# Patient Record
Sex: Female | Born: 1980 | Race: White | Hispanic: No | Marital: Married | Smoking: Former smoker
Health system: Southern US, Community
[De-identification: ages and names within clinical notes are randomized; demographics above are authoritative.]

## PROBLEM LIST (undated history)

## (undated) DIAGNOSIS — I10 Essential (primary) hypertension: Secondary | ICD-10-CM

## (undated) DIAGNOSIS — R569 Unspecified convulsions: Secondary | ICD-10-CM

## (undated) HISTORY — PX: CHOLECYSTECTOMY: SHX55

## (undated) HISTORY — DX: Essential (primary) hypertension: I10

---

## 2004-05-16 ENCOUNTER — Ambulatory Visit: Payer: Self-pay | Admitting: Internal Medicine

## 2004-06-15 ENCOUNTER — Ambulatory Visit: Payer: Self-pay | Admitting: Family Medicine

## 2004-10-03 ENCOUNTER — Ambulatory Visit: Payer: Self-pay | Admitting: Internal Medicine

## 2005-05-15 ENCOUNTER — Ambulatory Visit: Payer: Self-pay | Admitting: Family Medicine

## 2005-10-14 ENCOUNTER — Ambulatory Visit: Payer: Self-pay | Admitting: Family Medicine

## 2006-04-23 ENCOUNTER — Ambulatory Visit: Payer: Self-pay | Admitting: Internal Medicine

## 2006-05-26 ENCOUNTER — Ambulatory Visit: Payer: Self-pay | Admitting: Internal Medicine

## 2007-02-02 ENCOUNTER — Telehealth (INDEPENDENT_AMBULATORY_CARE_PROVIDER_SITE_OTHER): Payer: Self-pay | Admitting: *Deleted

## 2007-02-02 ENCOUNTER — Ambulatory Visit: Payer: Self-pay | Admitting: Family Medicine

## 2007-02-02 DIAGNOSIS — R030 Elevated blood-pressure reading, without diagnosis of hypertension: Secondary | ICD-10-CM | POA: Insufficient documentation

## 2007-02-02 DIAGNOSIS — F17200 Nicotine dependence, unspecified, uncomplicated: Secondary | ICD-10-CM | POA: Insufficient documentation

## 2007-02-02 DIAGNOSIS — Z87891 Personal history of nicotine dependence: Secondary | ICD-10-CM | POA: Insufficient documentation

## 2007-02-04 ENCOUNTER — Telehealth (INDEPENDENT_AMBULATORY_CARE_PROVIDER_SITE_OTHER): Payer: Self-pay | Admitting: *Deleted

## 2007-05-08 ENCOUNTER — Telehealth (INDEPENDENT_AMBULATORY_CARE_PROVIDER_SITE_OTHER): Payer: Self-pay | Admitting: *Deleted

## 2007-12-21 ENCOUNTER — Telehealth (INDEPENDENT_AMBULATORY_CARE_PROVIDER_SITE_OTHER): Payer: Self-pay | Admitting: *Deleted

## 2008-04-01 ENCOUNTER — Ambulatory Visit: Payer: Self-pay | Admitting: Family Medicine

## 2008-04-01 DIAGNOSIS — J309 Allergic rhinitis, unspecified: Secondary | ICD-10-CM | POA: Insufficient documentation

## 2008-04-05 LAB — CONVERTED CEMR LAB
ALT: 17 units/L (ref 0–35)
AST: 17 units/L (ref 0–37)
Albumin: 4.6 g/dL (ref 3.5–5.2)
BUN: 7 mg/dL (ref 6–23)
Basophils Relative: 0.3 % (ref 0.0–3.0)
CO2: 29 meq/L (ref 19–32)
Calcium: 9.6 mg/dL (ref 8.4–10.5)
Chloride: 107 meq/L (ref 96–112)
Cholesterol: 190 mg/dL (ref 0–200)
Creatinine, Ser: 0.6 mg/dL (ref 0.4–1.2)
Eosinophils Relative: 1.7 % (ref 0.0–5.0)
Glucose, Bld: 82 mg/dL (ref 70–99)
Hemoglobin: 15.1 g/dL — ABNORMAL HIGH (ref 12.0–15.0)
LDL Cholesterol: 94 mg/dL (ref 0–99)
Lymphocytes Relative: 21.4 % (ref 12.0–46.0)
MCHC: 34.9 g/dL (ref 30.0–36.0)
Monocytes Relative: 10 % (ref 3.0–12.0)
Neutro Abs: 5.5 10*3/uL (ref 1.4–7.7)
Neutrophils Relative %: 66.6 % (ref 43.0–77.0)
RBC: 4.29 M/uL (ref 3.87–5.11)
TSH: 1.45 microintl units/mL (ref 0.35–5.50)
Total Bilirubin: 1 mg/dL (ref 0.3–1.2)
Total Protein: 7.4 g/dL (ref 6.0–8.3)
VLDL: 23 mg/dL (ref 0–40)
WBC: 8.2 10*3/uL (ref 4.5–10.5)

## 2009-07-19 ENCOUNTER — Ambulatory Visit: Payer: Self-pay | Admitting: Family Medicine

## 2009-07-19 DIAGNOSIS — B9789 Other viral agents as the cause of diseases classified elsewhere: Secondary | ICD-10-CM

## 2009-07-19 DIAGNOSIS — J069 Acute upper respiratory infection, unspecified: Secondary | ICD-10-CM | POA: Insufficient documentation

## 2009-08-03 LAB — CONVERTED CEMR LAB

## 2009-09-01 ENCOUNTER — Ambulatory Visit: Payer: Self-pay | Admitting: Family Medicine

## 2009-09-01 LAB — CONVERTED CEMR LAB
AST: 17 units/L (ref 0–37)
Alkaline Phosphatase: 65 units/L (ref 39–117)
Bilirubin, Direct: 0 mg/dL (ref 0.0–0.3)
CO2: 26 meq/L (ref 19–32)
Calcium: 9.3 mg/dL (ref 8.4–10.5)
HDL: 86.4 mg/dL (ref >39.00)
Potassium: 4.3 meq/L (ref 3.5–5.1)
Sodium: 139 meq/L (ref 135–145)
Total CHOL/HDL Ratio: 2
Total Protein: 6.9 g/dL (ref 6.0–8.3)

## 2010-02-15 ENCOUNTER — Encounter (INDEPENDENT_AMBULATORY_CARE_PROVIDER_SITE_OTHER): Payer: Self-pay | Admitting: *Deleted

## 2010-08-14 NOTE — Assessment & Plan Note (Signed)
Summary: COLD/CLE   Vital Signs:  Patient profile:   30 year old female Height:      64 inches Weight:      140.75 pounds BMI:     24.25 Temp:     99 degrees F oral Pulse rate:   92 / minute Pulse rhythm:   regular BP sitting:   160 / 94  (left arm) Cuff size:   regular  Vitals Entered By: Lewanda Rife LPN (July 19, 2009 1:54 PM)  CC:  cold, productive cough with yellow mucus, drainage at back of throat, and head congested and irritated throat.Marland Kitchen  History of Present Illness: Here for cough which is productive, head congestion--onset 3wks ago --treated with Amoxicillin 500 three times a day x 10d --completed on 07/12/2009--did not get better --taking lots of OTCs for cough and congestion--mucinex D, Alkeseltzer D--helped, benadryl --not worse, just has not gone away  Is aware that BP elevated today and has been at home for several weeks --has 1 5hr boost drink daily for several months--high caffeine --coffee--0-1 [employer] --no tea --2 20oz diet Pepsis daily   Has appt for CPX 08/12/2009  Problems Prior to Update: 1)  Allergic Rhinitis  (ICD-477.9) 2)  Well Adult Exam  (ICD-V70.0) 3)  Elevated Blood Pressure Without Diagnosis of Hypertension  (ICD-796.2) 4)  Cigarette Smoker  (ICD-305.1)  Medications Prior to Update: 1)  Fluoxetine Hcl 20 Mg Caps (Fluoxetine Hcl) .Marland Kitchen.. 1 Daily 2)  Astepro 137 Mcg/spray Soln (Azelastine Hcl) .... 2 Sprays Two Times A Day For Allergic Rhinitis  Allergies: 1)  ! Codeine  Past History:  Family History: Last updated: 04/01/2008 Father: COPD--smoker, CAD--to have CABG, HBP, MI Mother: elevated lipids, HBP Siblings: 2 br--L&W               1 sis--L&W  DM-0 MI- PGM,  CVA- Maunt x2--aneurisms Prostate Cancer-0 Breast Cancer-Maunt--post menapausal Ovarian Cancer-0 Uterine Cancer-0 Colon Cancer-0 Drug/ ETOH Abuse-both sides  Depression- 0  Social History: Last updated: 04/01/2008 Marital Status: Married Children: 2 Occupation:  peridontal assist  Risk Factors: Alcohol Use: <1 (04/01/2008) Caffeine Use: 2 (04/01/2008) Exercise: no (04/01/2008)  Risk Factors: Smoking Status: current (02/02/2007) Packs/Day: 1/2 (04/01/2008) Passive Smoke Exposure: no (04/01/2008)  Review of Systems      See HPI  Physical Exam  General:  alert, well-developed, well-nourished, and well-hydrated.  NAD Ears:  TMs retracted bilat Nose:  no airflow obstruction, mucosal erythema, and mucosal edema.  sinuses neg Mouth:  no exudates and pharyngeal erythema.   Neck:  no JVD and no carotid bruits.   Lungs:  moist cough only, no crackles and no wheezes.   Heart:  normal rate, regular rhythm, and no murmur.   Extremities:  no edema either lower legs Neurologic:  alert & oriented X3, sensation intact to light touch, and gait normal.   Psych:  normally interactive and good eye contact.     Impression & Recommendations:  Problem # 1:  URI (ICD-465.9) Assessment New continue comfort care measures: increase po fluids, rest, tylenol or IBP as needed will begin clarinex once daily will use Afrin nasal spray at hs as needed will use tussinex at hs as needed cough see back in 7-10d if not improved Her updated medication list for this problem includes:    Alka-seltzer Plus Cold 2-7.8-325 Mg Tbef (Chlorphen-phenyleph-asa) ..... Otc as directed.    Mucinex D 60-600 Mg Xr12h-tab (Pseudoephedrine-guaifenesin) ..... Otc as directed.    Benadryl 25 Mg Tabs (Diphenhydramine hcl) .Marland KitchenMarland KitchenMarland KitchenMarland Kitchen  Otc as directed.    Clarinex 5 Mg Tabs (Desloratadine) .Marland Kitchen... Take 1 daily    Tussionex Pennkinetic Er 8-10 Mg/22ml Lqcr (Chlorpheniramine-hydrocodone) .Marland Kitchen... 1 tsp at bedtime for congestion  Problem # 2:  ELEVATED BLOOD PRESSURE WITHOUT DIAGNOSIS OF HYPERTENSION (ICD-796.2) Assessment: New new--?due to large caffeine load daily to stop the boost drink now to begin reducing caffeine sods by 1/4 weekly in 1 wk--to slow reduction if developes HA--understands--needs  to get off caffeine to keep CPX appt--will re-eval at that time  Complete Medication List: 1)  Alka-seltzer Plus Cold 2-7.8-325 Mg Tbef (Chlorphen-phenyleph-asa) .... Otc as directed. 2)  Mucinex D 60-600 Mg Xr12h-tab (Pseudoephedrine-guaifenesin) .... Otc as directed. 3)  Benadryl 25 Mg Tabs (Diphenhydramine hcl) .... Otc as directed. 4)  Clarinex 5 Mg Tabs (Desloratadine) .... Take 1 daily 5)  Afrin Nasal Spray  .Marland Kitchen.. 1-2 sprays two times a day as needed congestion 6)  Tussionex Pennkinetic Er 8-10 Mg/43ml Lqcr (Chlorpheniramine-hydrocodone) .Marland Kitchen.. 1 tsp at bedtime for congestion  Prescriptions: TUSSIONEX PENNKINETIC ER 8-10 MG/5ML LQCR (CHLORPHENIRAMINE-HYDROCODONE) 1 tsp at bedtime for congestion  #187ml x 0   Entered and Authorized by:   Gildardo Griffes FNP   Signed by:   Gildardo Griffes FNP on 07/19/2009   Method used:   Print then Give to Patient   RxID:   [id number]   Current Allergies (reviewed today): ! CODEINE

## 2010-08-14 NOTE — Letter (Signed)
Summary: Nadara Eaton letter  [place] at [facility]  [address] [place], [place] [address]   Phone: [phone]  Fax: [phone]       02/15/2010 MRN: [id number]  ADARIA HOLE [address] [place], [place]  [address]  Dear Ms. Quint[facility] [place] and [place] announce the retirement of Arta Silence, M.D., from full-time practice at the [facility] office effective January 11, 2010 and his plans of returning part-time.  It is important to Dr. Hetty Ely and to our practice that you understand that [facility] has seven physicians in our office for your health care needs.  We will continue to offer the same exceptional care that you have today.    Dr. Hetty Ely has spoken to many of you about his plans for retirement and returning part-time in the fall.   We will continue to work with you through the transition to schedule appointments for you in the office and meet the high standards that [facility] is committed to.   Again, it is with great pleasure that we share the news that Dr. Hetty Ely will return to [facility] in October of 2011 with a reduced schedule.    If you have any questions, or would like to request an appointment with one of our physicians, please call us at [phone] and press the option for Scheduling an appointment.  We take pleasure in providing you with excellent patient care and look forward to seeing you at your next office visit.  Our [facility] Physicians are:  Tillman Abide, M.D. Laurita Quint, M.D. Roxy Manns, M.D. Kerby Nora, M.D. Hannah Beat, M.D. Ruthe Mannan, M.D. We proudly welcomed Raechel Ache, M.D. and Eustaquio Boyden, M.D. to the practice in July/August 2011.  Sincerely,  [facility]

## 2010-08-14 NOTE — Assessment & Plan Note (Addendum)
Summary: cpx/dlo   Vital Signs:  Patient profile:   30 year old female Height:      64 inches Weight:      133.13 pounds BMI:     22.93 Temp:     98.8 degrees F oral Pulse rate:   96 / minute Pulse rhythm:   regular BP sitting:   112 / 80  (left arm) Cuff size:   regular  Vitals Entered By: Delilah Shan CMA (AAMA) (September 01, 2009 8:16 AM) CC: CPX   History of Present Illness: 30 yo healthy female here for CPE.    G3P2, had pap last month at OBGYN, no h/o abnormal paps smears. No complaints, doing well. 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  Tobacco abuse- 17 pack year history.  Quit 3 times in past, longest for 1 year. Quit cold [place].  Tried Chantix, gave her wierd dreams. Feels she is ready to quit now.  Did not like patches in past.  BP elevated a coupel of times in office, but normal today. Cut out energy drinks and has been normotensive since. No CP, SOB, palpitations.    Current Medications (verified): 1)  Zyban 150 Mg Xr12h-Tab (Bupropion Hcl (Smoking Deter)) .Marland Kitchen.. 1 Tab Po  Daily For 3 Days; Increase To 1 Tab Po Twice Daily; Treatment Should Continue For 7-12 Weeks  Allergies: 1)  ! Codeine  Family History: Reviewed history from 04/01/2008 and no changes required. Father: COPD--smoker, CAD--to have CABG, HBP, MI Mother: elevated lipids, HBP Siblings: 2 br--L&W               1 sis--L&W  DM-0 MI- PGM,  CVA- Maunt x2--aneurisms Prostate Cancer-0 Breast Cancer-Maunt--post menapausal Ovarian Cancer-0 Uterine Cancer-0 Colon Cancer-0 Drug/ ETOH Abuse-both sides  Depression- 0  Social History: Reviewed history from 04/01/2008 and no changes required. Marital Status: Married Children: 2 Occupation: peridontal assist  Review of Systems      See HPI General:  Denies fatigue and malaise. Eyes:  Denies blurring. ENT:  Denies difficulty swallowing. CV:  Denies chest pain or discomfort. Resp:   Denies shortness of breath. GI:  Denies abdominal pain, nausea, and vomiting. GU:  Denies abnormal vaginal bleeding, discharge, and dysuria. MS:  Denies joint pain, joint redness, and joint swelling. Derm:  Denies rash. Neuro:  Denies headaches. Psych:  Denies anxiety and depression.  Physical Exam  General:  alert, well-developed, well-nourished, and well-hydrated.  NAD Ears:  External ear exam shows no significant lesions or deformities.  Otoscopic examination reveals clear canals, tympanic membranes are intact bilaterally without bulging, retraction, inflammation or discharge. Hearing is grossly normal bilaterally. Mouth:  Oral mucosa and oropharynx without lesions or exudates.  Teeth in good repair. Neck:  supple, full ROM, and no masses.   Lungs:  Normal respiratory effort, chest expands symmetrically. Lungs are clear to auscultation, no crackles or wheezes. Heart:  Normal rate and regular rhythm. S1 and S2 normal without gallop, murmur, click, rub or other extra sounds. Abdomen:  Bowel sounds positive,abdomen soft and non-tender without masses, organomegaly or hernias noted. Extremities:  no edema either lower legs Neurologic:  alert & oriented X3, sensation intact to light touch, and gait normal.   Skin:  Intact without suspicious lesions or rashes Psych:  Cognition and judgment appear intact. Alert and cooperative with normal attention span and concentration. No apparent delusions, illusions, hallucinations   Impression & Recommendations:  Problem # 1:  Preventive Health  Care (ICD-V70.0) Reviewed preventive care protocols, scheduled due services, and updated immunizations Discussed nutrition, exercise, diet, and healthy lifestyle.  FLP, BMET today.  Problem # 2:  ELEVATED BLOOD PRESSURE WITHOUT DIAGNOSIS OF HYPERTENSION (ICD-796.2) Assessment: Improved Improved since stopping energy drinks, continue to monitor.  Problem # 3:  CIGARETTE SMOKER (ICD-305.1) Assessment:  Unchanged  Ready to quit.   Will try Zyban.  Will f/u in one month.  Her updated medication list for this problem includes:    Zyban 150 Mg Xr12h-tab (Bupropion hcl (smoking deter)) .Marland Kitchen... 1 tab po  daily for 3 days; increase to 1 tab po twice daily; treatment should continue for 7-12 weeks  Orders: Tobacco use cessation intermediate 3-10 minutes ([address])  Complete Medication List: 1)  Zyban 150 Mg Xr12h-tab (Bupropion hcl (smoking deter)) .Marland Kitchen.. 1 tab po  daily for 3 days; increase to 1 tab po twice daily; treatment should continue for 7-12 weeks  Patient Instructions: 1)  Great to meet you, [place]. 2)  Call me or come see me in 1 month to follow up your smoking. 3)  Stop smoking tips: Choose a quit date. Cut down before the quit date. Decide what you will do as a substitute when you feel the urge to smoke(gum, toothpick, exercise).  Prescriptions: ZYBAN 150 MG XR12H-TAB (BUPROPION HCL (SMOKING DETER)) 1 tab po  daily for 3 days; increase to 1 tab po twice daily; treatment should continue for 7-12 weeks  #60 x 1   Entered and Authorized by:   Ruthe Mannan MD   Signed by:   Ruthe Mannan MD on 09/01/2009   Method used:   [facility]        [facility] [employer] [phone]* (retail)       [address] [place], [place]  [address]       Ph: [phone] or [phone]       Fax: [phone]   RxID:   [id number]   Prior Medications (reviewed today): None Current Allergies (reviewed today): ! CODEINE PAP Result Date:  08/03/2009 PAP Result:  historical PAP Next Due:  1 yr

## 2010-08-27 ENCOUNTER — Encounter (INDEPENDENT_AMBULATORY_CARE_PROVIDER_SITE_OTHER): Payer: Self-pay | Admitting: *Deleted

## 2010-08-27 ENCOUNTER — Other Ambulatory Visit (INDEPENDENT_AMBULATORY_CARE_PROVIDER_SITE_OTHER): Payer: Managed Care, Other (non HMO)

## 2010-08-27 ENCOUNTER — Other Ambulatory Visit: Payer: Self-pay | Admitting: Family Medicine

## 2010-08-27 ENCOUNTER — Other Ambulatory Visit: Payer: Self-pay

## 2010-08-27 DIAGNOSIS — Z13220 Encounter for screening for lipoid disorders: Secondary | ICD-10-CM

## 2010-08-27 DIAGNOSIS — F17200 Nicotine dependence, unspecified, uncomplicated: Secondary | ICD-10-CM

## 2010-08-27 DIAGNOSIS — Z0000 Encounter for general adult medical examination without abnormal findings: Secondary | ICD-10-CM

## 2010-08-27 DIAGNOSIS — R030 Elevated blood-pressure reading, without diagnosis of hypertension: Secondary | ICD-10-CM

## 2010-08-27 DIAGNOSIS — E785 Hyperlipidemia, unspecified: Secondary | ICD-10-CM

## 2010-08-27 LAB — BASIC METABOLIC PANEL
BUN: 14 mg/dL (ref 6–23)
CO2: 23 mEq/L (ref 19–32)
Calcium: 9.3 mg/dL (ref 8.4–10.5)
Chloride: 102 mEq/L (ref 96–112)
Creatinine, Ser: 0.7 mg/dL (ref 0.4–1.2)

## 2010-08-27 LAB — CBC WITH DIFFERENTIAL/PLATELET
Eosinophils Absolute: 0.1 10*3/uL (ref 0.0–0.7)
Lymphocytes Relative: 22.3 % (ref 12.0–46.0)
MCHC: 34.5 g/dL (ref 30.0–36.0)
MCV: 101.5 fl — ABNORMAL HIGH (ref 78.0–100.0)
Monocytes Absolute: 0.8 10*3/uL (ref 0.1–1.0)
Neutrophils Relative %: 65.5 % (ref 43.0–77.0)
Platelets: 214 10*3/uL (ref 150.0–400.0)
WBC: 8.5 10*3/uL (ref 4.5–10.5)

## 2010-08-27 LAB — HEPATIC FUNCTION PANEL
Bilirubin, Direct: 0.2 mg/dL (ref 0.0–0.3)
Total Bilirubin: 1.1 mg/dL (ref 0.3–1.2)

## 2010-08-27 LAB — LIPID PANEL
Cholesterol: 203 mg/dL — ABNORMAL HIGH (ref 0–200)
HDL: 84.5 mg/dL (ref >39.00)
Total CHOL/HDL Ratio: 2
Triglycerides: 86 mg/dL (ref 0.0–149.0)
VLDL: 17.2 mg/dL (ref 0.0–40.0)

## 2010-09-03 ENCOUNTER — Encounter: Payer: Self-pay | Admitting: Family Medicine

## 2010-09-04 ENCOUNTER — Encounter (INDEPENDENT_AMBULATORY_CARE_PROVIDER_SITE_OTHER): Payer: Managed Care, Other (non HMO) | Admitting: Family Medicine

## 2010-09-04 ENCOUNTER — Encounter: Payer: Self-pay | Admitting: Family Medicine

## 2010-09-04 DIAGNOSIS — R002 Palpitations: Secondary | ICD-10-CM | POA: Insufficient documentation

## 2010-09-04 DIAGNOSIS — Z0000 Encounter for general adult medical examination without abnormal findings: Secondary | ICD-10-CM

## 2010-09-11 NOTE — Assessment & Plan Note (Signed)
Summary: cpe DLO   Vital Signs:  Patient profile:   30 year old female Height:      64 inches Weight:      134.25 pounds BMI:     23.13 Temp:     98.6 degrees F oral Pulse rate:   109 / minute Pulse rhythm:   regular BP sitting:   150 / 120  (right arm) Cuff size:   regular  Vitals Entered By: Linde Gillis CMA Duncan Dull) (September 04, 2010 8:18 AM) CC: complete physicial, no pap Comments patient just drank a 5 hour energy drink so pulse and BP maybe high due to the energy drink   CC:  complete physicial and no pap.  History of Present Illness: 30 yo healthy female here for CPE.    G3P2, has pap scheduled next week with OBGYN.  Tobacco abuse- 17 pack year history.  Quit 3 times in past, longest for 1 year. Quit cold [place]. Tried Zyban last year but didn't take it consistently so did not feel it helped.  Would like to try Chantix again eventhough it gave her vivid dreams.  No violent dreams, SI or HI with it.    BP elevated a coupel of times in office, but normal last year. Today, hypertensive and tachycardic. Had a soda and a 5 hour energy drink before coming in today. Admits to having recurrent episodes of palpitations and dizziness, especially after drinking energy drinks.   No CP, No SOB.  No syncope.  BMET, CBC, TSH all normal last week.    Current Medications (verified): 1)  Zyban 150 Mg Xr12h-Tab (Bupropion Hcl (Smoking Deter)) .Marland Kitchen.. 1 Tab Po  Daily For 3 Days; Increase To 1 Tab Po Twice Daily; Treatment Should Continue For 7-12 Weeks 2)  Chantix Starting Month Pak 0.5 Mg X 11 & 1 Mg X 42  Misc (Varenicline Tartrate) .... 0.5mg  By Mouth Once Daily For 3 Days, Then Twice Daily For 4 Days and Then 1mg  By Mouth 2 Times Daily 3)  Azithromycin 250 Mg  Tabs (Azithromycin) .... 2 By  Mouth Today and Then 1 Daily For 4 Days  Allergies: 1)  ! Codeine  Past History:  Family History: Last updated: 04/01/2008 Father: COPD--smoker, CAD--to have CABG, HBP, MI Mother: elevated  lipids, HBP Siblings: 2 br--L&W               1 sis--L&W  DM-0 MI- PGM,  CVA- Maunt x2--aneurisms Prostate Cancer-0 Breast Cancer-Maunt--post menapausal Ovarian Cancer-0 Uterine Cancer-0 Colon Cancer-0 Drug/ ETOH Abuse-both sides  Depression- 0  Social History: Last updated: 04/01/2008 Marital Status: Married Children: 2 Occupation: peridontal assist  Risk Factors: Alcohol Use: <1 (04/01/2008) Caffeine Use: 2 (04/01/2008) Exercise: no (04/01/2008)  Risk Factors: Smoking Status: current (02/02/2007) Packs/Day: 1/2 (04/01/2008) Passive Smoke Exposure: no (04/01/2008)  Review of Systems      See HPI General:  Denies fever and malaise. Eyes:  Denies blurring. ENT:  Denies difficulty swallowing. CV:  Complains of palpitations; denies chest pain or discomfort, fainting, lightheadness, and shortness of breath with exertion. Resp:  Denies shortness of breath. GI:  Denies abdominal pain, bloody stools, and change in bowel habits. GU:  Denies discharge and dysuria. MS:  Denies joint pain, joint redness, and joint swelling. Derm:  Denies rash. Neuro:  Denies headaches. Psych:  Denies anxiety and depression. Endo:  Denies cold intolerance and heat intolerance.  Physical Exam  General:  alert, well-developed, well-nourished, and well-hydrated.  NAD Head:  normocephalic and atraumatic.  Eyes:  vision grossly intact, pupils equal, pupils round, and pupils reactive to light.   Ears:  R ear normal and L ear normal.   Nose:  no external deformity.   Mouth:  good dentition, mild erythema.   Neck:  supple, full ROM, and no masses.   Lungs:  Normal respiratory effort, chest expands symmetrically. Lungs are clear to auscultation, no crackles or wheezes. Heart:  Normal rate and regular rhythm. S1 and S2 normal without gallop, murmur, click, rub or other extra sounds. Abdomen:  Bowel sounds positive,abdomen soft and non-tender without masses, organomegaly or hernias noted. Msk:  No  deformity or scoliosis noted of thoracic or lumbar spine.   Extremities:  no edema Neurologic:  alert & oriented X3 and gait normal.   Skin:  Intact without suspicious lesions or rashes Psych:  Cognition and judgment appear intact. Alert and cooperative with normal attention span and concentration. No apparent delusions, illusions, hallucinations   Impression & Recommendations:  Problem # 1:  PALPITATIONS, OCCASIONAL (ICD-785.1) Assessment New likely due to caffeine drinks.  I advsied stopping them immediately. EKG- mild tachycardia, sinus rhythm, short PR (118). TSH, CBC normal. Will have her follow up in one month. Orders: EKG w/ Interpretation (93000)  Problem # 2:  WELL ADULT EXAM (ICD-V70.0) Reviewed preventive care protocols, scheduled due services, and updated immunizations Discussed nutrition, exercise, diet, and healthy lifestyle.  Discussed labs with pt.  Complete Medication List: 1)  Zyban 150 Mg Xr12h-tab (Bupropion hcl (smoking deter)) .Marland Kitchen.. 1 tab po  daily for 3 days; increase to 1 tab po twice daily; treatment should continue for 7-12 weeks 2)  Chantix Starting Month Pak 0.5 Mg X 11 & 1 Mg X 42 Misc (Varenicline tartrate) .... 0.5mg  by mouth once daily for 3 days, then twice daily for 4 days and then 1mg  by mouth 2 times daily 3)  Azithromycin 250 Mg Tabs (Azithromycin) .... 2 by  mouth today and then 1 daily for 4 days Prescriptions: AZITHROMYCIN 250 MG  TABS (AZITHROMYCIN) 2 by  mouth today and then 1 daily for 4 days  #6 x 0   Entered and Authorized by:   Ruthe Mannan MD   Signed by:   Ruthe Mannan MD on 09/04/2010   Method used:   Print then Give to Patient   RxID:   [id number] CHANTIX STARTING MONTH PAK 0.5 MG X 11 & 1 MG X 42  MISC (VARENICLINE TARTRATE) 0.5mg  by mouth once daily for 3 days, then twice daily for 4 days and then 1mg  by mouth 2 times daily  #1 pack x 0   Entered and Authorized by:   Ruthe Mannan MD   Signed by:   Ruthe Mannan MD on 09/04/2010    Method used:   Print then Give to Patient   RxID:   [id number]    Orders Added: 1)  EKG w/ Interpretation [93000] 2)  Est. Patient 18-39 years [99395] 3)  Est. Patient Level III [02542]    Current Allergies (reviewed today): ! CODEINE

## 2010-11-30 NOTE — Assessment & Plan Note (Signed)
[facility]                                 ON-CALL NOTE   DONELL, SLIWINSKI                       MRN:          [id number]  DATE:02/07/2007                            DOB:          [birth year]    Telephone Triage note on February 07, 2007.  Time received is 4:01 p.m.  The  patient is [name] Phillips; the caller is the same.  She sees Dr. Alphonsus Sias.  Date of Birth:  Nov 26, 1980; telephone [phone].  I spoke with the  patient last night about symptoms of bronchitis with fever and a deep,  dry cough.  She had been taking Ceftin and last night we switched to  doxycycline, to take 100 mg b.i.d.  She did not get this filled until  this morning, and thus has had only a single dose of doxycycline.  Her  symptoms are the same, not surprisingly, and she calls complaining that  she is no better.  My answer is, of course she is no better.  I told her  to expect at least 3 days treatment with an antibiotic to expect any  meaningful improvement.  She can continue with the cough medicine that  she had been using; and, as I told her last night, if she is no better  by Monday she should be seen back in the office.     Tera Mater. Clent Ridges, MD  Electronically Signed    SAF/MedQ  DD: 02/07/2007  DT: 02/08/2007  Job #: [id number]

## 2010-11-30 NOTE — Assessment & Plan Note (Signed)
[facility]                                 ON-CALL NOTE   [name] Phillips, [name] Phillips                       MRN:          [id number]  DATE:02/06/2007                            DOB:          1981/01/23    PATIENT OF:  Dr. Alphonsus Sias.   TIME OF CALL:  5:45pm.   TELEPHONE NUMBER:  [phone]   The patient has had a cough for the past week. She saw Everrett Coombe on  Monday and was prescribed Vi-Q-Tuss for cough and also Ceftin which she  is taking twice daily. She is no better at all. She still has  intermittent fevers of 101 degrees. The cough is sometimes dry and  sometimes productive of yellow sputum. She denies any chest pain or  shortness of breath. She has no known allergies. My response is to  advise her to stop Ceftin and instead switch to Doxycycline 100 mg  b.i.d. for 10 days. [phone] She can contact  me if she gets worse over the weekend or by Monday if she is no better,  she should contact Dr. Alphonsus Sias.     Tera Mater. Clent Ridges, MD  Electronically Signed    SAF/MedQ  DD: 02/06/2007  DT: 02/08/2007  Job #: [id number]

## 2011-03-15 ENCOUNTER — Telehealth: Payer: Self-pay | Admitting: *Deleted

## 2011-03-15 MED ORDER — VARENICLINE TARTRATE 0.5 MG X 11 & 1 MG X 42 PO MISC: ORAL | Status: DC

## 2011-03-15 MED ORDER — VARENICLINE TARTRATE 1 MG PO TABS: 1.0000 mg | ORAL_TABLET | Freq: Two times a day (BID) | ORAL | Status: DC

## 2011-03-15 NOTE — Telephone Encounter (Signed)
Sent in.plz notify pt.  

## 2011-03-15 NOTE — Telephone Encounter (Signed)
Patient is requesting a new Rx for Chantix.  She stated that when Dr. Dayton Martes gave her the original Rx her insurance would not cover it but now she has a different insurance and would like to try it.  Please advise in her absence.  Patient is aware that Dr. Dayton Martes is out of the office until 03/25/2011.  [facility]

## 2011-03-15 NOTE — Telephone Encounter (Signed)
Patient advised as instructed via telephone. 

## 2011-03-25 ENCOUNTER — Telehealth: Payer: Self-pay | Admitting: *Deleted

## 2011-03-25 NOTE — Telephone Encounter (Signed)
Pt states she started on chantix last week and on Saturday started having panic attacks.  She had never had these prior to starting the medicine.  She continued having them yesterday and feels like she could have one today. She took a half of her mother's xananx and felt better yesterday.  She hasnt had any chantix since Saturday, asks if the attacks could have been caused by the chantix.  Advised her yes, chantix can cause many side effects.  Advised her to give this more time to get out of her system.

## 2011-03-26 NOTE — Telephone Encounter (Signed)
Pt called back, she is not any better today.  States she is constantly fighting off panic attacks and her blood pressure has gone up. She has checked it and it was 179/109, 159/100 yesterday.  She is unable to come in due to her work schedule.  She is asking if there is anything that she can take, can she have something mild for anxiety, just a few to get her over this hump.  [facility] [address]

## 2011-03-26 NOTE — Telephone Encounter (Signed)
Medication phoned to pharmacy. Patient advised.  

## 2011-03-26 NOTE — Telephone Encounter (Signed)
Please call in xanax 0.25mg , 1 po tid prn anxiety, sedation caution.  #10, no rf.  Schedule f/u with PMD and have her avoid energy drinks, caffeine, coffee in meantime.  Stop chantix, remove from med list and list as intolerant.  Thanks.

## 2011-04-04 ENCOUNTER — Encounter: Payer: Self-pay | Admitting: Family Medicine

## 2011-04-04 LAB — HM PAP SMEAR

## 2011-04-05 ENCOUNTER — Encounter: Payer: Self-pay | Admitting: Family Medicine

## 2011-04-05 ENCOUNTER — Ambulatory Visit (INDEPENDENT_AMBULATORY_CARE_PROVIDER_SITE_OTHER): Payer: Managed Care, Other (non HMO) | Admitting: Family Medicine

## 2011-04-05 VITALS — BP 150/100 | HR 102 | Temp 98.7°F | Wt 131.5 lb

## 2011-04-05 DIAGNOSIS — R002 Palpitations: Secondary | ICD-10-CM

## 2011-04-05 DIAGNOSIS — R030 Elevated blood-pressure reading, without diagnosis of hypertension: Secondary | ICD-10-CM

## 2011-04-05 DIAGNOSIS — F410 Panic disorder [episodic paroxysmal anxiety] without agoraphobia: Secondary | ICD-10-CM

## 2011-04-05 DIAGNOSIS — F411 Generalized anxiety disorder: Secondary | ICD-10-CM | POA: Insufficient documentation

## 2011-04-05 MED ORDER — HYDROCHLOROTHIAZIDE 12.5 MG PO CAPS: 12.5000 mg | ORAL_CAPSULE | Freq: Every day | ORAL | Status: DC

## 2011-04-05 MED ORDER — ALPRAZOLAM 0.5 MG PO TABS: 0.5000 mg | ORAL_TABLET | Freq: Three times a day (TID) | ORAL | Status: DC | PRN

## 2011-04-05 NOTE — Progress Notes (Signed)
30 yo here for follow up palpitations and anxiety.  Filled rx for Chantix because insurance would cover it on 03/16/2011. Took it until 03/22/2011 and felt fine.  Forgot to take it on 9/8. On 9/8, was at her son's football game and started having palpitations and SOB again (evaluted for this in February, neg work up). CBC, TSH, CMET within normal limits. EKG showed short PR otherwise unremarkable. No CP when this occurs.    Went home, thought she was overheated but had multiple attacks over next several days. Did not restart Chantix.    Called office last week stating that she was constantly fighting off panic attacks and her blood pressure has gone up. She has checked it and it was 179/109, 159/100 at work.    Started as need xanax and symptoms have improved greatly.    Strong family h/o HTN.   No blurred vision, LE edema, otherwise feeling fine.  Patient Active Problem List  Diagnoses  . CIGARETTE SMOKER  . URI  . ALLERGIC RHINITIS  . ELEVATED BLOOD PRESSURE WITHOUT DIAGNOSIS OF HYPERTENSION  . PALPITATIONS, OCCASIONAL   No past medical history on file. No past surgical history on file. History  Substance Use Topics  . Smoking status: Not on file  . Smokeless tobacco: Not on file  . Alcohol Use:    Family History  Problem Relation Age of Onset  . Hyperlipidemia Mother   . Hypertension Mother   . COPD Father   . Heart disease Father   . Hypertension Father   . Cancer Maternal Aunt     breast  . Alcohol abuse Other    Allergies  Allergen Reactions  . Chantix (Varenicline Tartrate) Anxiety  . Codeine     REACTION: hallucinations   Current Outpatient Prescriptions on File Prior to Visit  Medication Sig Dispense Refill  . buPROPion (WELLBUTRIN SR) 150 MG 12 hr tablet Take one tablet daily for three days; increase to one tablet twice daily; treatment should continue for 7-12 weeks       . varenicline (CHANTIX PAK) 0.5 MG X 11 & 1 MG X 42 tablet one 0.5mg  tablet once daily  for 3 days then increase to one 0.5mg  tablet twice daily for 3 days, then increase to one 1mg  tablet twice daily  53 tablet  0  . varenicline (CHANTIX) 1 MG tablet Take 1 tablet (1 mg total) by mouth 2 (two) times daily.  60 tablet  1   The PMH, PSH, Social History, Family History, Medications, and allergies have been reviewed in [facility], and have been updated if relevant.  Review of Systems  See HPI   Physical Exam  BP 150/100  Pulse 102  Temp(Src) 98.7 F (37.1 C) (Oral)  Wt 131 lb 8 oz (59.648 kg)  LMP 03/05/2011  General: alert, well-developed, well-nourished, and well-hydrated. NAD  Ears: External ear exam shows no significant lesions or deformities. Otoscopic examination reveals clear canals, tympanic membranes are intact bilaterally without bulging, retraction, inflammation or discharge. Hearing is grossly normal bilaterally.  Mouth: Oral mucosa and oropharynx without lesions or exudates. Teeth in good repair.  Neck: supple, full ROM, and no masses.  Lungs: Normal respiratory effort, chest expands symmetrically. Lungs are clear to auscultation, no crackles or wheezes.  Heart: tachycardic, and regular rhythm. S1 and S2 normal without gallop, murmur, click, rub or other extra sounds.  Abdomen: Bowel sounds positive,abdomen soft and non-tender without masses, organomegaly or hernias noted.  Extremities: no edema either lower legs  Neurologic: alert & oriented X3, sensation intact to light touch, and gait normal.  Skin: Intact without suspicious lesions or rashes  Psych: Cognition and judgment appear intact. Alert and cooperative with normal attention span and concentration. No apparent delusions, illusions, hallucinations   Assessment and Plan: 1. ELEVATED BLOOD PRESSURE WITHOUT DIAGNOSIS OF HYPERTENSION  Deteriorated.   Likely multifactorial- adverse rx to Chantix, anxiety but there is likely an underlying diagnosis of HTN.  Will start HCTZ 12.5 mg daily today.  Check a BMET.  Follow  up in 1 month.   Basic Metabolic Panel (BMET)  2. Panic attacks   New.  Continue Xanax for now, likely short term due to adverse medication reaction.   3. PALPITATIONS, OCCASIONAL   EKG unchanged from prior. Recheck labs today. Follow up in one month.   TSH, CBC w/Diff, EKG 12-Lead

## 2011-04-05 NOTE — Patient Instructions (Signed)
Good to see you. Please come back to see me in one month.

## 2011-04-06 LAB — CBC WITH DIFFERENTIAL/PLATELET
Eosinophils Absolute: 0.1 10*3/uL (ref 0.0–0.7)
HCT: 41.1 % (ref 36.0–46.0)
Hemoglobin: 14.1 g/dL (ref 12.0–15.0)
Lymphs Abs: 1.7 10*3/uL (ref 0.7–4.0)
MCH: 33.7 pg (ref 26.0–34.0)
MCHC: 34.3 g/dL (ref 30.0–36.0)
MCV: 98.3 fL (ref 78.0–100.0)
Monocytes Absolute: 0.9 10*3/uL (ref 0.1–1.0)
Monocytes Relative: 10 % (ref 3–12)
Neutrophils Relative %: 69 % (ref 43–77)
RBC: 4.18 MIL/uL (ref 3.87–5.11)

## 2011-04-06 LAB — BASIC METABOLIC PANEL
BUN: 9 mg/dL (ref 6–23)
CO2: 14 mEq/L — ABNORMAL LOW (ref 19–32)
Calcium: 8.9 mg/dL (ref 8.4–10.5)
Glucose, Bld: 70 mg/dL (ref 70–99)
Sodium: 139 mEq/L (ref 135–145)

## 2011-05-03 ENCOUNTER — Ambulatory Visit (INDEPENDENT_AMBULATORY_CARE_PROVIDER_SITE_OTHER): Payer: BC Managed Care – PPO | Admitting: Family Medicine

## 2011-05-03 VITALS — BP 140/98 | HR 88 | Temp 99.4°F | Wt 131.0 lb

## 2011-05-03 DIAGNOSIS — F410 Panic disorder [episodic paroxysmal anxiety] without agoraphobia: Secondary | ICD-10-CM

## 2011-05-03 DIAGNOSIS — I10 Essential (primary) hypertension: Secondary | ICD-10-CM | POA: Insufficient documentation

## 2011-05-03 LAB — BASIC METABOLIC PANEL
CO2: 25 mEq/L (ref 19–32)
Calcium: 9.2 mg/dL (ref 8.4–10.5)
Chloride: 104 mEq/L (ref 96–112)
Creatinine, Ser: 0.6 mg/dL (ref 0.4–1.2)
Sodium: 139 mEq/L (ref 135–145)

## 2011-05-03 MED ORDER — ALPRAZOLAM 0.5 MG PO TABS: 0.5000 mg | ORAL_TABLET | Freq: Three times a day (TID) | ORAL | Status: DC | PRN

## 2011-05-03 MED ORDER — BUSPIRONE HCL 15 MG PO TABS: 7.5000 mg | ORAL_TABLET | Freq: Two times a day (BID) | ORAL | Status: AC

## 2011-05-03 MED ORDER — HYDROCHLOROTHIAZIDE 12.5 MG PO CAPS: 12.5000 mg | ORAL_CAPSULE | Freq: Every day | ORAL | Status: DC

## 2011-05-03 NOTE — Patient Instructions (Signed)
Good to see you. Let's continue your current dose of HCTZ. We will add Buspar 7.5 mg twice daily. Please call me in one month with an update, sooner if you have any questions or concerns.

## 2011-05-03 NOTE — Progress Notes (Signed)
30 yo here for one month follow up palpitations, anxiety and HTN.    Saw her last month for palpitations and anxiety that were presumed to be multifactorial- adverse rxn to Chantix, anxiety and some underlying HTN.  CBC, TSH, CMET within normal limits. EKG showed short PR otherwise unremarkable. No CP when this occurs.    Started Xanax for anxiety and HCTZ 12.5 mg daily for HTN.    No blurred vision, LE edema, otherwise feeling fine. BP has been fine, checking it at work.  Always increased at doctor's office.  Having less panic attacks but still requiring several xanax per day. Does have palpitations when they occur but overall feels better.    Patient Active Problem List  Diagnoses  . CIGARETTE SMOKER  . URI  . ALLERGIC RHINITIS  . ELEVATED BLOOD PRESSURE WITHOUT DIAGNOSIS OF HYPERTENSION  . PALPITATIONS, OCCASIONAL  . Panic attacks  . HTN (hypertension)   No past medical history on file. No past surgical history on file. History  Substance Use Topics  . Smoking status: Current Everyday Smoker  . Smokeless tobacco: Not on file  . Alcohol Use: Not on file   Family History  Problem Relation Age of Onset  . Hyperlipidemia Mother   . Hypertension Mother   . COPD Father   . Heart disease Father   . Hypertension Father   . Cancer Maternal Aunt     breast  . Alcohol abuse Other    Allergies  Allergen Reactions  . Chantix (Varenicline Tartrate) Anxiety  . Codeine     REACTION: hallucinations   Current Outpatient Prescriptions on File Prior to Visit  Medication Sig Dispense Refill  . ALPRAZolam (XANAX) 0.5 MG tablet Take 1 tablet (0.5 mg total) by mouth 3 (three) times daily as needed for sleep. Take one tablet by mouth three time daily as needed for anxiety  90 tablet  0  . buPROPion (WELLBUTRIN SR) 150 MG 12 hr tablet Take one tablet daily for three days; increase to one tablet twice daily; treatment should continue for 7-12 weeks       . hydrochlorothiazide  (MICROZIDE) 12.5 MG capsule Take 1 capsule (12.5 mg total) by mouth daily.  30 capsule  11  . varenicline (CHANTIX PAK) 0.5 MG X 11 & 1 MG X 42 tablet one 0.5mg  tablet once daily for 3 days then increase to one 0.5mg  tablet twice daily for 3 days, then increase to one 1mg  tablet twice daily  53 tablet  0  . varenicline (CHANTIX) 1 MG tablet Take 1 tablet (1 mg total) by mouth 2 (two) times daily.  60 tablet  1   The PMH, PSH, Social History, Family History, Medications, and allergies have been reviewed in [facility], and have been updated if relevant.  Review of Systems  See HPI   Physical Exam  LMP 03/05/2011 BP 140/98  Pulse 88  Temp(Src) 99.4 F (37.4 C) (Oral)  Wt 131 lb (59.421 kg)  LMP 03/05/2011  General: alert, well-developed, well-nourished, and well-hydrated. NAD  Ears: External ear exam shows no significant lesions or deformities. Otoscopic examination reveals clear canals, tympanic membranes are intact bilaterally without bulging, retraction, inflammation or discharge. Hearing is grossly normal bilaterally.  Mouth: Oral mucosa and oropharynx without lesions or exudates. Teeth in good repair.  Lungs: Normal respiratory effort, chest expands symmetrically. Lungs are clear to auscultation, no crackles or wheezes.  Heart: regular rate and regular rhythm. S1 and S2 normal without gallop, murmur, click, rub or other  extra sounds.  Abdomen: Bowel sounds positive,abdomen soft and non-tender without masses, organomegaly or hernias noted.  Extremities: no edema either lower legs  Neurologic: alert & oriented X3, sensation intact to light touch, and gait normal.  Skin: Intact without suspicious lesions or rashes  Psych: Cognition and judgment appear intact. Alert and cooperative with normal attention span and concentration. No apparent delusions, illusions, hallucinations   Assessment and Plan: 1. HTN (hypertension)   Improved.  Check BMET today. Continue HCTZ at current dose. Basic  Metabolic Panel (BMET)  2. Panic attacks  Improved but still occurring quite frequently. Will add Buspar 7.5 mg twice daily Follow up in one month.   busPIRone (BUSPAR) 15 MG tablet

## 2011-05-06 ENCOUNTER — Encounter: Payer: Self-pay | Admitting: *Deleted

## 2011-05-06 ENCOUNTER — Ambulatory Visit: Payer: Self-pay | Admitting: Family Medicine

## 2011-06-03 ENCOUNTER — Other Ambulatory Visit: Payer: Self-pay | Admitting: *Deleted

## 2011-06-03 MED ORDER — ALPRAZOLAM 0.5 MG PO TABS: ORAL_TABLET | ORAL | Status: DC

## 2011-06-04 NOTE — Telephone Encounter (Signed)
[facility] 

## 2011-07-05 ENCOUNTER — Other Ambulatory Visit: Payer: Self-pay | Admitting: *Deleted

## 2011-07-05 MED ORDER — ALPRAZOLAM 0.5 MG PO TABS: ORAL_TABLET | ORAL | Status: DC

## 2011-07-05 NOTE — Telephone Encounter (Signed)
[facility] 

## 2011-07-05 NOTE — Telephone Encounter (Signed)
Last refill 06/03/2011.

## 2011-07-05 NOTE — Telephone Encounter (Signed)
error 

## 2011-08-26 ENCOUNTER — Other Ambulatory Visit: Payer: Self-pay

## 2011-08-26 MED ORDER — ALPRAZOLAM 0.5 MG PO TABS: ORAL_TABLET | ORAL | Status: DC

## 2011-08-26 NOTE — Telephone Encounter (Signed)
[facility] request refill alprazolam 0.5 mg. Pt last seen 05/03/11.Please advise.

## 2011-08-26 NOTE — Telephone Encounter (Signed)
[facility] 

## 2011-08-27 ENCOUNTER — Encounter: Payer: Self-pay | Admitting: Family Medicine

## 2011-08-27 ENCOUNTER — Ambulatory Visit (INDEPENDENT_AMBULATORY_CARE_PROVIDER_SITE_OTHER): Payer: BC Managed Care – PPO | Admitting: Family Medicine

## 2011-08-27 VITALS — BP 120/84 | HR 88 | Temp 98.7°F | Ht 64.0 in | Wt 134.0 lb

## 2011-08-27 DIAGNOSIS — J069 Acute upper respiratory infection, unspecified: Secondary | ICD-10-CM

## 2011-08-27 DIAGNOSIS — J029 Acute pharyngitis, unspecified: Secondary | ICD-10-CM

## 2011-08-27 LAB — POCT RAPID STREP A (OFFICE): Rapid Strep A Screen: NEGATIVE

## 2011-08-27 MED ORDER — HYDROCOD POLST-CHLORPHEN POLST 10-8 MG/5ML PO LQCR: 5.0000 mL | Freq: Every evening | ORAL | Status: DC | PRN

## 2011-08-27 NOTE — Progress Notes (Signed)
  Subjective:    Patient ID: [name] Phillips, female    DOB: 07-13-1981, 31 y.o.   MRN: [id number]  HPI CC: ST  Pleasant 31 yo with 4d h/o feeling ill.  several night h/o coughing that is keeping her up at night.  Also with ST.  This morning noticed white spots on tonsils.  Mild HA and R ear pain but not hurting today.  + chest congestion initially, now more in head.  No throat swelling.  No chest pain , SOB.  Taking daytime and night time cough medicines.  No abd pain, n/v, fevers/chills, tooth pain, rashes, myalgia, arthralgia.  4 wks ago took zpack for ?infection.  + husband sick as well and co worker sick.  Smoking 3 cig/day - significant progress.  No h/o asthma ,COPD.  + seasonal allergies.  Review of Systems Per HPI    Objective:   Physical Exam  Nursing note and vitals reviewed. Constitutional: She appears well-developed and well-nourished. No distress.  HENT:  Head: Normocephalic and atraumatic.  Right Ear: Hearing, tympanic membrane, external ear and ear canal normal.  Left Ear: Hearing, tympanic membrane, external ear and ear canal normal.  Nose: Nose normal. No mucosal edema or rhinorrhea. Right sinus exhibits no maxillary sinus tenderness and no frontal sinus tenderness. Left sinus exhibits no maxillary sinus tenderness and no frontal sinus tenderness.  Mouth/Throat: Uvula is midline and mucous membranes are normal. Posterior oropharyngeal erythema present. No oropharyngeal exudate, posterior oropharyngeal edema or tonsillar abscesses.       Mild exudates bilateral tonsils  Eyes: Conjunctivae and EOM are normal. Pupils are equal, round, and reactive to light. No scleral icterus.  Neck: Normal range of motion. Neck supple.  Cardiovascular: Normal rate, regular rhythm, normal heart sounds and intact distal pulses.   No murmur heard. Pulmonary/Chest: Effort normal and breath sounds normal. No respiratory distress. She has no wheezes. She has no rales.  Lymphadenopathy:    She has cervical adenopathy (mild R AC LAD).  Skin: Skin is warm and dry. No rash noted.  Psychiatric: She has a normal mood and affect.      Assessment & Plan:

## 2011-08-27 NOTE — Assessment & Plan Note (Signed)
2/4 centor criteria - RST checked and  Lungs clear. Anticipate viral URTI with cough, supportive care discussed. Red flags to return discussed.

## 2011-08-27 NOTE — Patient Instructions (Addendum)
Sounds like you have a viral upper respiratory infection. Antibiotics are not needed for this.  Viral infections usually take 7-10 days to resolve.  The cough can last several weeks to go away. Use medication as prescribed:  tussionex for cough as needed at night (sedation precautions). Push fluids and plenty of rest. Please let us know if you are not improving as expected, or if you have high fevers (>101.5) or difficulty swallowing or worsening productive cough. Call clinic with questions.  Good to see you today.

## 2011-09-11 ENCOUNTER — Telehealth: Payer: Self-pay | Admitting: Family Medicine

## 2011-09-11 NOTE — Telephone Encounter (Signed)
It is ok to try that but we run the risk of it getting too low without [place] knowing what her BP and pulse truly aer.

## 2011-09-11 NOTE — Telephone Encounter (Signed)
Spoke with patient.  She says there's no way that she can come in until late march due to her work schedule.  She knows her BP and pulse have been up, but she hasnt checked either because that would make her more anxious.  She is asking if ok for her to double up on hctz until she can see you.  She says she tries to avoid seeing her BP readings when they are high, but she will start checking it if you want her to .

## 2011-09-11 NOTE — Telephone Encounter (Signed)
Triage Record Num: [id number] Operator: Di Kindle Patient Name: [name] Phillips Call Date & Time: 09/11/2011 12:06:12PM Patient Phone: [phone] PCP: Ruthe Mannan Patient Gender: Female PCP Fax : [phone] Patient DOB: [birth year] Practice Name: [facility] Reason for Call: Caller: [name]/Patient is calling with a question about HCTZ.The medication was written by Ruthe Mannan Nestor Ramp). Past OV 3 months ago, 12.5 mg daily, states was told to try, feels like not working as well for last 2 weeks, BP higher (she feels it but has not checked due to anxiety issues) Wants to know is she can double up on that dose? [facility] [place]. Please call with recommendations (may leave a message) OFFICE Please Protocol(s) Used: Medication Questions - Adult Recommended Outcome per Protocol: Call Provider within 4 Hours Reason for Outcome: Recurrence of a symptom(s) or illness post prescribed medication treatment AND provider instructed patient to call if symptom(s) returned. Care Advice: ~ 09/11/2011 12:13:05PM Page 1 of 1 [id number]

## 2011-09-11 NOTE — Telephone Encounter (Signed)
Advised pt.  She said she will just stay on the safe side and keep things as they are until she can get in to see you.

## 2011-09-11 NOTE — Telephone Encounter (Signed)
Pt needs to come in for office visit.

## 2011-09-20 ENCOUNTER — Ambulatory Visit (INDEPENDENT_AMBULATORY_CARE_PROVIDER_SITE_OTHER): Payer: BC Managed Care – PPO | Admitting: Family Medicine

## 2011-09-20 ENCOUNTER — Encounter: Payer: Self-pay | Admitting: Family Medicine

## 2011-09-20 VITALS — BP 150/80 | HR 108 | Temp 98.3°F | Wt 135.0 lb

## 2011-09-20 DIAGNOSIS — I10 Essential (primary) hypertension: Secondary | ICD-10-CM

## 2011-09-20 DIAGNOSIS — F410 Panic disorder [episodic paroxysmal anxiety] without agoraphobia: Secondary | ICD-10-CM

## 2011-09-20 MED ORDER — BISOPROLOL-HYDROCHLOROTHIAZIDE 2.5-6.25 MG PO TABS: 1.0000 | ORAL_TABLET | Freq: Every day | ORAL | Status: DC

## 2011-09-20 NOTE — Patient Instructions (Signed)
Stop taking your HCTZ 12.5. Start taking your combo pill- HCTZ 6.25 and bisoprolol 2.5 mg daily. Please check your pressure a couple of times next week weeks and call me with an update.

## 2011-09-20 NOTE — Progress Notes (Signed)
31 yo here for one  follow up palpitations, anxiety and HTN.    Initially saw her in 03/2012 for palpitations and anxiety that were presumed to be multifactorial- adverse rxn to Chantix, anxiety and some underlying HTN.  CBC, TSH, CMET within normal limits. EKG showed short PR otherwise unremarkable. No CP when this occurs.    Started Xanax for anxiety and HCTZ 12.5 mg daily for HTN in 03/2011 and added Buspar 7.5 mg twice daily in October 2012 but she had not started it yet because she felt better, less anxious.    Over past few weeks, could feel her BP was elevated and her heart was racing- checked it at work and has been ranging in 150s/90s-100s with pulse in 100s.  No CP, SOB or LE edema.     Patient Active Problem List  Diagnoses  . CIGARETTE SMOKER  . ALLERGIC RHINITIS  . PALPITATIONS, OCCASIONAL  . Panic attacks  . HTN (hypertension)   No past medical history on file. No past surgical history on file. History  Substance Use Topics  . Smoking status: Current Everyday Smoker  . Smokeless tobacco: Not on file  . Alcohol Use: Not on file   Family History  Problem Relation Age of Onset  . Hyperlipidemia Mother   . Hypertension Mother   . COPD Father   . Heart disease Father   . Hypertension Father   . Cancer Maternal Aunt     breast  . Alcohol abuse Other    Allergies  Allergen Reactions  . Chantix (Varenicline Tartrate) Anxiety  . Codeine     REACTION: hallucinations?? Pt denies.   Current Outpatient Prescriptions on File Prior to Visit  Medication Sig Dispense Refill  . ALPRAZolam (XANAX) 0.5 MG tablet Take one tablet by mouth three time daily as needed for anxiety  90 tablet  0  . busPIRone (BUSPAR) 15 MG tablet Take 0.5 tablets (7.5 mg total) by mouth 2 (two) times daily.  30 tablet  1  . chlorpheniramine-HYDROcodone (TUSSIONEX) 10-8 MG/5ML LQCR Take 5 mLs by mouth at bedtime as needed. Sedation precautions  180 mL  0  . hydrochlorothiazide (MICROZIDE) 12.5  MG capsule Take 1 capsule (12.5 mg total) by mouth daily.  30 capsule  11   The PMH, PSH, Social History, Family History, Medications, and allergies have been reviewed in [facility], and have been updated if relevant.  Review of Systems  See HPI   Physical Exam  BP 150/80  Pulse 108  Temp(Src) 98.3 F (36.8 C) (Oral)  Wt 135 lb (61.236 kg)  LMP 08/13/2011                 General: alert, well-developed, well-nourished, and well-hydrated. NAD  Ears: External ear exam shows no significant lesions or deformities. Otoscopic examination reveals clear canals, tympanic membranes are intact bilaterally without bulging, retraction, inflammation or discharge. Hearing is grossly normal bilaterally.  Mouth: Oral mucosa and oropharynx without lesions or exudates. Teeth in good repair.  Lungs: Normal respiratory effort, chest expands symmetrically. Lungs are clear to auscultation, no crackles or wheezes.  Heart: regular rate and regular rhythm. S1 and S2 normal without gallop, murmur, click, rub or other extra sounds.  Abdomen: Bowel sounds positive,abdomen soft and non-tender without masses, organomegaly or hernias noted.  Extremities: no edema either lower legs  Neurologic: alert & oriented X3, sensation intact to light touch, and gait normal.  Skin: Intact without suspicious lesions or rashes  Psych: Cognition and judgment appear intact.  Alert and cooperative with normal attention span and concentration. No apparent delusions, illusions, hallucinations   Assessment and Plan: 1. HTN (hypertension)   Deteriorated. Denies increased anxiety at home or at work. Will change to bisoprolol 2.5/HCTZ 6.25 mg. Follow up in one month.    2.  Anxiety Deteriorated- likely worsening and caused by #1- gets anxious when her BP is elevated. Advised started buspar next week (I would prefer not to make two changes to meds at one time). busPIRone (BUSPAR) 15 MG tablet

## 2011-10-01 ENCOUNTER — Other Ambulatory Visit: Payer: Self-pay | Admitting: *Deleted

## 2011-10-01 MED ORDER — ALPRAZOLAM 0.5 MG PO TABS: ORAL_TABLET | ORAL | Status: DC

## 2011-10-01 NOTE — Telephone Encounter (Signed)
rx called to pharmacy 

## 2011-10-18 ENCOUNTER — Other Ambulatory Visit: Payer: Self-pay | Admitting: Family Medicine

## 2011-11-15 ENCOUNTER — Other Ambulatory Visit: Payer: Self-pay | Admitting: Family Medicine

## 2011-11-15 MED ORDER — ALPRAZOLAM 0.5 MG PO TABS: ORAL_TABLET | ORAL | Status: DC

## 2011-11-15 NOTE — Telephone Encounter (Signed)
[facility] [address] No last filled date given.

## 2011-11-15 NOTE — Telephone Encounter (Signed)
[facility] 

## 2011-12-16 ENCOUNTER — Other Ambulatory Visit: Payer: Self-pay

## 2011-12-16 MED ORDER — BISOPROLOL-HYDROCHLOROTHIAZIDE 2.5-6.25 MG PO TABS: 1.0000 | ORAL_TABLET | Freq: Every day | ORAL | Status: DC

## 2011-12-16 NOTE — Telephone Encounter (Signed)
[facility] [address] request 90 day refill bisoprolol-HCTZ # 90 x 3.

## 2012-01-09 ENCOUNTER — Other Ambulatory Visit: Payer: Self-pay | Admitting: *Deleted

## 2012-01-09 MED ORDER — ALPRAZOLAM 0.5 MG PO TABS: ORAL_TABLET | ORAL | Status: DC

## 2012-01-09 NOTE — Telephone Encounter (Signed)
[facility] [address] 

## 2012-01-09 NOTE — Telephone Encounter (Signed)
Medicine called to pharmacy. 

## 2012-01-22 ENCOUNTER — Telehealth: Payer: Self-pay | Admitting: Family Medicine

## 2012-01-22 NOTE — Telephone Encounter (Signed)
Caller: Keiri/Patient; PCP: Ruthe Mannan (Nestor Ramp); CB#: [phone]; Has had nausea, vomiting and diarrhea x 48 hours.  No fever.  She is urinating.   LMP June 19. Unable to keep her b/p medication down.  Tried again this morning.  Needs to be seen but no appt. available with Dr. Dayton Martes.  Please advise.

## 2012-01-22 NOTE — Telephone Encounter (Signed)
Noted! Thank you

## 2012-01-22 NOTE — Telephone Encounter (Signed)
Jacki Cones, please call to check on patient. I am not in the office this afternoon. It sounds like she has a stomach bug but if she is dehydrated, I would recommend urgent care. It's probably ok that she is not taking her BP medication- in fact taking it when you're dehydrated can cause kidney problems.

## 2012-01-22 NOTE — Telephone Encounter (Signed)
Spoke with patient, advised her as instructions.  She states she vomited once this morning, but not since.  Says she is drinking gatorade and ginger ale to try and stay hydrated.  She will call back tomorrow with update on symptoms.

## 2012-02-25 ENCOUNTER — Other Ambulatory Visit: Payer: Self-pay | Admitting: *Deleted

## 2012-02-25 NOTE — Telephone Encounter (Signed)
OK to refill

## 2012-02-26 MED ORDER — ALPRAZOLAM 0.5 MG PO TABS: ORAL_TABLET | ORAL | Status: DC

## 2012-02-26 NOTE — Telephone Encounter (Signed)
Medicine called to pharmacy. 

## 2012-03-20 ENCOUNTER — Ambulatory Visit: Payer: BC Managed Care – PPO | Admitting: Family Medicine

## 2012-03-20 NOTE — Progress Notes (Signed)
NO show

## 2012-04-09 ENCOUNTER — Other Ambulatory Visit: Payer: Self-pay | Admitting: Family Medicine

## 2012-04-09 NOTE — Telephone Encounter (Signed)
Pt left v/m going out of town 04/10/12.Pt request [facility] [address] today if possible.

## 2012-04-09 NOTE — Telephone Encounter (Signed)
[facility] 

## 2012-05-11 ENCOUNTER — Emergency Department: Payer: Self-pay | Admitting: Emergency Medicine

## 2012-05-11 LAB — COMPREHENSIVE METABOLIC PANEL
Albumin: 4.2 g/dL (ref 3.4–5.0)
Alkaline Phosphatase: 72 U/L (ref 50–136)
Bilirubin,Total: 1.3 mg/dL — ABNORMAL HIGH (ref 0.2–1.0)
Calcium, Total: 8.9 mg/dL (ref 8.5–10.1)
Chloride: 105 mmol/L (ref 98–107)
Co2: 21 mmol/L (ref 21–32)
EGFR (African American): >60
Glucose: 88 mg/dL (ref 65–99)
Potassium: 3.7 mmol/L (ref 3.5–5.1)
SGOT(AST): 43 U/L — ABNORMAL HIGH (ref 15–37)
SGPT (ALT): 40 U/L (ref 12–78)
Sodium: 139 mmol/L (ref 136–145)
Total Protein: 7.4 g/dL (ref 6.4–8.2)

## 2012-05-11 LAB — CBC
HGB: 14.1 g/dL (ref 12.0–16.0)
MCV: 100 fL (ref 80–100)
RBC: 4.02 10*6/uL (ref 3.80–5.20)
WBC: 7 10*3/uL (ref 3.6–11.0)

## 2012-05-13 ENCOUNTER — Telehealth: Payer: Self-pay | Admitting: Family Medicine

## 2012-05-13 NOTE — Telephone Encounter (Signed)
Caller: Eimy/Patient; Patient Name: [name] Phillips; PCP: Ruthe Mannan [facility]); Best Callback Phone Number: [phone].  Patient calling in follow up after ED visit 05/11/12 AM for vomiting and diarrhea.  States vomiting has stopped, as well as the nausea, but diarrhea continues; onset 05/10/12.  Per diarrhea protocol, emergent symptoms denied; advised for home care, with callback parameters given.

## 2012-05-19 ENCOUNTER — Other Ambulatory Visit: Payer: Self-pay | Admitting: Family Medicine

## 2012-05-20 NOTE — Telephone Encounter (Signed)
Medicine called to pharmacy. 

## 2012-06-03 ENCOUNTER — Ambulatory Visit: Payer: BC Managed Care – PPO | Admitting: Family Medicine

## 2012-06-03 DIAGNOSIS — Z0289 Encounter for other administrative examinations: Secondary | ICD-10-CM

## 2012-06-26 ENCOUNTER — Other Ambulatory Visit: Payer: Self-pay | Admitting: *Deleted

## 2012-06-26 MED ORDER — ALPRAZOLAM 0.5 MG PO TABS: ORAL_TABLET | ORAL | Status: DC

## 2012-06-26 NOTE — Telephone Encounter (Signed)
Called to pharmacy- [phone].

## 2012-07-20 ENCOUNTER — Inpatient Hospital Stay: Payer: Self-pay | Admitting: Family Medicine

## 2012-07-20 ENCOUNTER — Encounter: Payer: Self-pay | Admitting: Family Medicine

## 2012-07-20 ENCOUNTER — Ambulatory Visit (INDEPENDENT_AMBULATORY_CARE_PROVIDER_SITE_OTHER): Payer: BC Managed Care – PPO | Admitting: Family Medicine

## 2012-07-20 VITALS — BP 144/100 | HR 108 | Temp 98.4°F | Wt 138.0 lb

## 2012-07-20 DIAGNOSIS — F410 Panic disorder [episodic paroxysmal anxiety] without agoraphobia: Secondary | ICD-10-CM

## 2012-07-20 DIAGNOSIS — G40909 Epilepsy, unspecified, not intractable, without status epilepticus: Secondary | ICD-10-CM | POA: Insufficient documentation

## 2012-07-20 DIAGNOSIS — I10 Essential (primary) hypertension: Secondary | ICD-10-CM

## 2012-07-20 DIAGNOSIS — R569 Unspecified convulsions: Secondary | ICD-10-CM | POA: Insufficient documentation

## 2012-07-20 LAB — URINALYSIS, COMPLETE
Bacteria: NONE SEEN
Bilirubin,UR: NEGATIVE
Glucose,UR: 150 mg/dL (ref 0–75)
Nitrite: NEGATIVE
Ph: 6 (ref 4.5–8.0)
Protein: 100
Specific Gravity: 1.009 (ref 1.003–1.030)

## 2012-07-20 LAB — COMPREHENSIVE METABOLIC PANEL
Alkaline Phosphatase: 95 U/L (ref 50–136)
Anion Gap: 13 (ref 7–16)
BUN: 9 mg/dL (ref 7–18)
Calcium, Total: 9 mg/dL (ref 8.5–10.1)
Chloride: 101 mmol/L (ref 98–107)
EGFR (Non-African Amer.): >60
Glucose: 119 mg/dL — ABNORMAL HIGH (ref 65–99)
Osmolality: 270 (ref 275–301)
Potassium: 3.4 mmol/L — ABNORMAL LOW (ref 3.5–5.1)
SGPT (ALT): 63 U/L (ref 12–78)
Sodium: 135 mmol/L — ABNORMAL LOW (ref 136–145)

## 2012-07-20 LAB — CBC
HCT: 38.4 % (ref 35.0–47.0)
HGB: 13.4 g/dL (ref 12.0–16.0)
MCH: 36.1 pg — ABNORMAL HIGH (ref 26.0–34.0)
MCHC: 34.8 g/dL (ref 32.0–36.0)
Platelet: 153 10*3/uL (ref 150–440)
RBC: 3.7 10*6/uL — ABNORMAL LOW (ref 3.80–5.20)

## 2012-07-20 LAB — DRUG SCREEN, URINE
Amphetamines, Ur Screen: NEGATIVE (ref ?–1000)
Barbiturates, Ur Screen: NEGATIVE (ref ?–200)
Cannabinoid 50 Ng, Ur ~~LOC~~: NEGATIVE (ref ?–50)
Cocaine Metabolite,Ur ~~LOC~~: NEGATIVE (ref ?–300)
MDMA (Ecstasy)Ur Screen: NEGATIVE (ref ?–500)
Opiate, Ur Screen: NEGATIVE (ref ?–300)
Phencyclidine (PCP) Ur S: NEGATIVE (ref ?–25)
Tricyclic, Ur Screen: NEGATIVE (ref ?–1000)

## 2012-07-20 LAB — PHOSPHORUS: Phosphorus: 1.5 mg/dL — ABNORMAL LOW (ref 2.5–4.9)

## 2012-07-20 LAB — ETHANOL
Ethanol %: <0.003 % (ref 0.000–0.080)
Ethanol: <3 mg/dL

## 2012-07-20 NOTE — Progress Notes (Signed)
  Subjective:    Patient ID: [name] Phillips, female    DOB: 02/28/1981, 32 y.o.   MRN: [id number]  HPI  32 yo female with no prior h/o seizure disorder presents for seizure.  Comes in with her husband.  Per pt and her husband, she was at home at 7:15 on the couch and her sons witnessed her having a seizure. Reported that she did vomit.  Was not incontinent to bladder or bowels.  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  Pt was evaluated by EMS who told her she should follow up with me.  Was not taken to ER.  While I was asking her questions about recent drug use, she had a tonic-clonic seizure in office.   Review of Systems    See HPI Objective:   Physical Exam BP 144/100  Pulse 108  Temp 98.4 F (36.9 C)  Wt 138 lb (62.596 kg) Gen: Actively seizing in office- lasted under 5 minutes.       Assessment & Plan:   1. Seizure   Witnessed in office.  Lasted under 5 minutes.  Did vomit, no incontinence.  Confused post ictally but was able to tell us who her husband was after several minutes. EMS called and pt taken to hospital for further evaluation.

## 2012-07-21 ENCOUNTER — Other Ambulatory Visit: Payer: Self-pay | Admitting: Family Medicine

## 2012-07-21 LAB — BASIC METABOLIC PANEL
Anion Gap: 10 (ref 7–16)
Calcium, Total: 8.1 mg/dL — ABNORMAL LOW (ref 8.5–10.1)
Chloride: 108 mmol/L — ABNORMAL HIGH (ref 98–107)
EGFR (African American): >60
Glucose: 80 mg/dL (ref 65–99)
Potassium: 2.9 mmol/L — ABNORMAL LOW (ref 3.5–5.1)

## 2012-07-21 LAB — CBC WITH DIFFERENTIAL/PLATELET
Basophil %: 0.8 %
Eosinophil #: 0.1 10*3/uL (ref 0.0–0.7)
Lymphocyte #: 1.3 10*3/uL (ref 1.0–3.6)
Lymphocyte %: 16.2 %
MCH: 37.3 pg — ABNORMAL HIGH (ref 26.0–34.0)
MCV: 105 fL — ABNORMAL HIGH (ref 80–100)
Monocyte #: 0.6 x10 3/mm (ref 0.2–0.9)
Monocyte %: 7.6 %
RBC: 3.34 10*6/uL — ABNORMAL LOW (ref 3.80–5.20)
RDW: 13.9 % (ref 11.5–14.5)

## 2012-07-21 LAB — POTASSIUM: Potassium: 3.6 mmol/L (ref 3.5–5.1)

## 2012-07-21 LAB — PHENYTOIN LEVEL, TOTAL: Dilantin: 23.3 ug/mL — ABNORMAL HIGH (ref 10.0–20.0)

## 2012-07-21 LAB — TSH: Thyroid Stimulating Horm: 1.76 u[IU]/mL

## 2012-07-22 ENCOUNTER — Telehealth: Payer: Self-pay | Admitting: Family Medicine

## 2012-07-22 NOTE — Telephone Encounter (Signed)
Pt discharged home from [facility] on 07/21/2012 s/p seizure.  Please call pt to make sure she is scheduled here for hospital follow up.  Also, we are in process of requesting records from [place] and neuro (Dr. Sherryll Burger).

## 2012-07-23 ENCOUNTER — Telehealth: Payer: Self-pay | Admitting: Family Medicine

## 2012-07-23 NOTE — Telephone Encounter (Signed)
Patient said she has a quick question about her medication and asked if you would call her in the morning.

## 2012-07-23 NOTE — Telephone Encounter (Signed)
Noted! Thank you

## 2012-07-23 NOTE — Telephone Encounter (Signed)
I spoke with Leonette Most at [facility], after I spoke with the patient, and explained to him that pt did have EEG while in hospital.  He checked and called back to tell me that the test was done but the report hasn't been read yet.  States that that dept has reports from December that havent been read yet.  He will try to find out tomorrow the status and let me know.

## 2012-07-24 MED ORDER — ALPRAZOLAM 0.25 MG PO TABS: ORAL_TABLET | ORAL | Status: DC

## 2012-07-24 NOTE — Telephone Encounter (Signed)
Returned pt's call. Discussed EEG results were normal. Please call in 5 tablets of xanax as entered below to [facility].

## 2012-07-24 NOTE — Telephone Encounter (Signed)
Medicine called to [facility].

## 2012-07-24 NOTE — Addendum Note (Signed)
Addended by: Dianne Dun on: 07/24/2012 11:54 AM   Modules accepted: Orders

## 2012-07-28 ENCOUNTER — Telehealth: Payer: Self-pay

## 2012-07-28 MED ORDER — FLUCONAZOLE 150 MG PO TABS: 150.0000 mg | ORAL_TABLET | Freq: Once | ORAL | Status: DC

## 2012-07-28 NOTE — Telephone Encounter (Signed)
Advised patient

## 2012-07-28 NOTE — Telephone Encounter (Signed)
Rx sent 

## 2012-07-28 NOTE — Telephone Encounter (Signed)
Triage Record Num: [id number] Operator: Jeraldine Loots Patient Name: [name] Phillips Call Date & Time: 07/27/2012 6:37:53PM Patient Phone: [phone] PCP: Ruthe Mannan Patient Gender: Female PCP Fax : [phone] Patient DOB: 11-10-1980 Practice Name: [facility] Reason for Call: Caller: Deliliah/Patient; PCP: Ruthe Mannan (Family Practice); CB#: [phone]; Call regarding Vaginal itching Was recently in the hospital for seizures and an ear infection. She is still on Augmentin for same. Has started with vaginal itching. No discharge. LMP 12/29. Unable to call in Diflucan 150mg  1 po, her pharmacy is closed for the night and has no voice mail. Uses [facility] on [facility] in [place] at [phone].  Protocol(s) Used: Vaginal Discharge or Irritation Recommended Outcome per Protocol: See Provider within 24 hours Reason for Outcome: Genital itching, burning or redness

## 2012-07-31 ENCOUNTER — Encounter: Payer: Self-pay | Admitting: Family Medicine

## 2012-07-31 ENCOUNTER — Ambulatory Visit (INDEPENDENT_AMBULATORY_CARE_PROVIDER_SITE_OTHER): Payer: BC Managed Care – PPO | Admitting: Family Medicine

## 2012-07-31 VITALS — BP 118/80 | HR 88 | Temp 98.2°F | Wt 139.0 lb

## 2012-07-31 DIAGNOSIS — E876 Hypokalemia: Secondary | ICD-10-CM

## 2012-07-31 DIAGNOSIS — I10 Essential (primary) hypertension: Secondary | ICD-10-CM

## 2012-07-31 DIAGNOSIS — F410 Panic disorder [episodic paroxysmal anxiety] without agoraphobia: Secondary | ICD-10-CM

## 2012-07-31 DIAGNOSIS — R569 Unspecified convulsions: Secondary | ICD-10-CM

## 2012-07-31 LAB — COMPREHENSIVE METABOLIC PANEL
BUN: 5 mg/dL — ABNORMAL LOW (ref 6–23)
CO2: 28 mEq/L (ref 19–32)
Calcium: 9.8 mg/dL (ref 8.4–10.5)
Chloride: 104 mEq/L (ref 96–112)
Creat: 0.49 mg/dL — ABNORMAL LOW (ref 0.50–1.10)
Glucose, Bld: 113 mg/dL — ABNORMAL HIGH (ref 70–99)
Total Bilirubin: 0.3 mg/dL (ref 0.3–1.2)

## 2012-07-31 MED ORDER — MIRTAZAPINE 15 MG PO TABS: 15.0000 mg | ORAL_TABLET | Freq: Two times a day (BID) | ORAL | Status: DC

## 2012-07-31 MED ORDER — BISOPROLOL-HYDROCHLOROTHIAZIDE 2.5-6.25 MG PO TABS: 1.0000 | ORAL_TABLET | Freq: Every day | ORAL | Status: DC

## 2012-07-31 NOTE — Progress Notes (Signed)
Subjective:    Patient ID: [name] Phillips, female    DOB: 04-Feb-1981, 32 y.o.   MRN: [id number]  HPI 32 yo here for hospital follow up.  Notes reviewed and I have also been in contact with patient throughout hospitalization and after discharge.  She was called day after discharge from our office- see phone note.  [facility] medical records reviewed.  Admitted 1/6 - 17/2014 after she had a seizure at home the morning of the 6th as well as one in our office later that morning. She had another seizure while at the hospital awaiting her CT scan- total of 3 witnessed seizures.  Neurology consulted- MRI pre and post contrast were neg. EEG was neg. UDS neg.  She was started on phenytoin and advised to follow up with neurology as outpatient- appt was scheduled by patient for next Thursday.  It was felt her seizures were due to lack of sleep but she did not have a sleep deprived EEG.  She feels great since discharge.  No further seizures. Blood pressure is excellent.  Anxiety well controlled on Remeron.  Her husband is with her today and states she has not looked this happy and healthy in over a year.  Patient Active Problem List  Diagnosis  . CIGARETTE SMOKER  . ALLERGIC RHINITIS  . PALPITATIONS, OCCASIONAL  . Panic attacks  . HTN (hypertension)  . Seizure   No past medical history on file. No past surgical history on file. History  Substance Use Topics  . Smoking status: Current Every Day Smoker  . Smokeless tobacco: Not on file  . Alcohol Use: Not on file   Family History  Problem Relation Age of Onset  . Hyperlipidemia Mother   . Hypertension Mother   . COPD Father   . Heart disease Father   . Hypertension Father   . Cancer Maternal Aunt     breast  . Alcohol abuse Other    Allergies  Allergen Reactions  . Chantix (Varenicline Tartrate) Anxiety  . Codeine     REACTION: hallucinations?? Pt denies.   Current Outpatient Prescriptions on File Prior to Visit    Medication Sig Dispense Refill  . mirtazapine (REMERON) 15 MG tablet Take 15 mg by mouth at bedtime.      . phenytoin (DILANTIN) 200 MG ER capsule Take 200 mg by mouth daily.      Marland Kitchen ALPRAZolam (XANAX) 0.25 MG tablet TAKE 1 TABLET 3 TIMES A DAY AS NEEDED FOR ANXIETY OR SLEEP  10 tablet  0   The PMH, PSH, Social History, Family History, Medications, and allergies have been reviewed in [facility], and have been updated if relevant.    Review of Systems    See HPI Objective:   Physical Exam BP 118/80  Pulse 88  Temp 98.2 F (36.8 C)  Wt 139 lb (63.05 kg) Wt Readings from Last 3 Encounters:  07/31/12 139 lb (63.05 kg)  07/20/12 138 lb (62.596 kg)  09/20/11 135 lb (61.236 kg)    General:  [name],well-nourished,in no acute distress; alert,appropriate and cooperative throughout examination Head:  normocephalic and atraumatic.   Eyes:  vision grossly intact, pupils equal, pupils round, and pupils reactive to light.   Ears:  R ear normal and L ear normal.   Nose:  no external deformity.   Mouth:  good dentition.   Neck:  No deformities, masses, or tenderness noted. Lungs:  Normal respiratory effort, chest expands symmetrically. Lungs are clear to auscultation, no crackles or wheezes. Heart:  Normal rate and regular rhythm. S1 and S2 normal without gallop, murmur, click, rub or other extra sounds. Msk:  No deformity or scoliosis noted of thoracic or lumbar spine.   Extremities:  No clubbing, cyanosis, edema, or deformity noted with normal full range of motion of all joints.   Neurologic:  alert & oriented X3 and gait normal.   Skin:  Intact without suspicious lesions or rashes Cervical Nodes:  No lymphadenopathy noted Axillary Nodes:  No palpable lymphadenopathy Psych:  Cognition and judgment appear intact. Alert and cooperative with normal attention span and concentration. No apparent delusions, illusions, hallucinations        Assessment & Plan:   1. Seizure  It does not  appear that the EEG was done under sleep deprived state.  She likely does have an underlying seizure disorder.  Keep appt with neuro next week. Continue phenytoin.    2. Panic attacks  Anxiety well controlled on remeron.  3. HTN (hypertension)  Great control! Recheck BMET today given the concern in the hospital for hypokalemia.

## 2012-07-31 NOTE — Patient Instructions (Addendum)
Great to see you. We will call you with your lab results.  You look great. Have a wonderful weekend and please eat those bananas! :)

## 2012-08-07 ENCOUNTER — Telehealth: Payer: Self-pay

## 2012-08-07 NOTE — Telephone Encounter (Signed)
Pt left v/m saw neurologist; was satisfied with test results but repeating some labs; pt will take Dilantin for 2 years; pt has f/u appt with neurologist 09/14/12. Pt said she is doing well and wanted to update Dr Dayton Martes.

## 2012-08-13 NOTE — Telephone Encounter (Signed)
Pt got another call from neurologist office this morning with lab results; pt wants Dr Elmer Sow opinion of what should do. Pt said Vit D level was almost bottomed out and pt was told to take Vit D 50,000 weekly for 8 weeks then take Vit D 5000 OTC daily. Pt does not want to take if not needed and wants Dr Elmer Sow input. Pt's Dilantin level barely showed up and pt has never missed taking Dilantin and neurologist wants to increase dose; pt wants to discuss with Dr Dayton Martes first. Also pt states she is more grotchy and irritable than usual. Pt wants to know if side effect from a med or is Remeron not working. Pt request call back from Dr Dayton Martes at [phone].Please advise.

## 2012-08-13 NOTE — Telephone Encounter (Signed)
Noted! Thank you

## 2012-08-13 NOTE — Telephone Encounter (Signed)
If her VIt D was that low, I would recommend taking it.  Please call her neurologist's office to get these results faxed to [place].    She was doing so well when I last saw her from an anxiety standpoint.  I can't say for sure if the remeron is working or not but it was working quite well.  The maximum dose is 45 mg per day.  Would she like to try a dosage increase?

## 2012-08-13 NOTE — Telephone Encounter (Signed)
Advised patient as instructed.  She doesn't want to increase remeron dose right now, says she will call back if she feels that it's not working as well. Also, she states neurology office told her they were faxing lab results today.

## 2012-08-19 ENCOUNTER — Other Ambulatory Visit: Payer: Self-pay | Admitting: *Deleted

## 2012-08-19 NOTE — Telephone Encounter (Signed)
Received fax from pharmacy for Remeron refill, per chart it was refilled 07/31/12, but rx was not printed or called in. I called in rx to [facility].

## 2012-08-21 DIAGNOSIS — E876 Hypokalemia: Secondary | ICD-10-CM

## 2012-08-21 DIAGNOSIS — I10 Essential (primary) hypertension: Secondary | ICD-10-CM

## 2012-08-21 DIAGNOSIS — F410 Panic disorder [episodic paroxysmal anxiety] without agoraphobia: Secondary | ICD-10-CM

## 2012-10-16 ENCOUNTER — Encounter: Payer: Self-pay | Admitting: Family Medicine

## 2012-10-16 ENCOUNTER — Ambulatory Visit (INDEPENDENT_AMBULATORY_CARE_PROVIDER_SITE_OTHER): Payer: BC Managed Care – PPO | Admitting: Family Medicine

## 2012-10-16 VITALS — BP 150/92 | HR 80 | Temp 98.3°F | Wt 142.0 lb

## 2012-10-16 DIAGNOSIS — G40909 Epilepsy, unspecified, not intractable, without status epilepticus: Secondary | ICD-10-CM

## 2012-10-16 DIAGNOSIS — I10 Essential (primary) hypertension: Secondary | ICD-10-CM

## 2012-10-16 DIAGNOSIS — F411 Generalized anxiety disorder: Secondary | ICD-10-CM

## 2012-10-16 MED ORDER — MIRTAZAPINE 15 MG PO TABS: 30.0000 mg | ORAL_TABLET | Freq: Every day | ORAL | Status: DC

## 2012-10-16 MED ORDER — BISOPROLOL-HYDROCHLOROTHIAZIDE 5-6.25 MG PO TABS: 1.0000 | ORAL_TABLET | Freq: Every day | ORAL | Status: DC

## 2012-10-16 MED ORDER — FLUOXETINE HCL 20 MG PO TABS: 20.0000 mg | ORAL_TABLET | Freq: Every day | ORAL | Status: DC

## 2012-10-16 NOTE — Progress Notes (Signed)
Subjective:    Patient ID: [name] Phillips, female    DOB: 1981-03-24, 32 y.o.   MRN: [id number]  HPI 32 yo pleasant female with h/o HTN, anxiety and seizure d/o here for follow up.  Had been doing well with BP and anxiety but then she started new job last month.  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  She loves it but it is stressful.  Seizure d/o- Last saw neurology, Dr. Sherryll Burger, on 10/08/12.  Note reviewed.  Plan is to continue Dilantin.  She has had no further seizures.  Anxiety- deteriorated.  When I last saw her in January, anxiety was well controlled on Remeron.  Dr. Sherryll Burger just increased it to 30 mg qhs.  She actually feels worse- anxiety not better and she feels like her heart is racing more.  Has tried Buspar previously- felt it was ineffective.  HTN-  taking bisoprolol 2.5/HCTZ.  Now having short episodes of palpitations and facial flushing.    Patient Active Problem List  Diagnosis  . CIGARETTE SMOKER  . ALLERGIC RHINITIS  . PALPITATIONS, OCCASIONAL  . Generalized anxiety disorder  . HTN (hypertension)  . Seizure disorder   No past medical history on file. No past surgical history on file. History  Substance Use Topics  . Smoking status: Current Every Day Smoker  . Smokeless tobacco: Not on file  . Alcohol Use: Not on file   Family History  Problem Relation Age of Onset  . Hyperlipidemia Mother   . Hypertension Mother   . COPD Father   . Heart disease Father   . Hypertension Father   . Cancer Maternal Aunt     breast  . Alcohol abuse Other    Allergies  Allergen Reactions  . Chantix (Varenicline Tartrate) Anxiety  . Codeine     REACTION: hallucinations?? Pt denies.   Current Outpatient Prescriptions on File Prior to Visit  Medication Sig Dispense Refill  . ALPRAZolam (XANAX) 0.25 MG tablet TAKE 1 TABLET 3 TIMES A DAY AS NEEDED FOR ANXIETY OR SLEEP  10 tablet  0  . bisoprolol-hydrochlorothiazide (ZIAC) 2.5-6.25 MG per tablet Take 1 tablet by mouth  daily.  30 tablet  3  . mirtazapine (REMERON) 15 MG tablet Take 1 tablet (15 mg total) by mouth 2 (two) times daily.  60 tablet  6  . Multiple Vitamin (MULTIVITAMIN) tablet Take 1 tablet by mouth daily.      . phenytoin (DILANTIN) 200 MG ER capsule Take 200 mg by mouth daily.       No current facility-administered medications on file prior to visit.   The PMH, PSH, Social History, Family History, Medications, and allergies have been reviewed in [facility], and have been updated if relevant.    Review of Systems    See HPI Objective:   Physical Exam BP 150/92  Pulse 80  Temp(Src) 98.3 F (36.8 C)  Wt 142 lb (64.411 kg)  BMI 24.36 kg/m2 BP Readings from Last 3 Encounters:  10/16/12 150/92  07/31/12 118/80  07/20/12 144/100    General:  [name],well-nourished,in no acute distress; alert,appropriate and cooperative throughout examination Head:  normocephalic and atraumatic.   Neck:  No deformities, masses, or tenderness noted. Lungs:  Normal respiratory effort, chest expands symmetrically. Lungs are clear to auscultation, no crackles or wheezes. Heart:  Normal rate and regular rhythm. S1 and S2 normal without gallop, murmur, click, rub or other extra sounds. Msk:  No deformity or scoliosis noted of thoracic or lumbar spine.  Extremities:  No clubbing, cyanosis, edema, or deformity noted with normal full range of motion of all joints.   Neurologic:  alert & oriented X3 and gait normal.   Skin:  Intact without suspicious lesions or rashes Psych:  Cognition and judgment appear intact. Alert and cooperative with normal attention span and concentration. No apparent delusions, illusions, hallucinations    Assessment & Plan:   1. Generalized anxiety disorder Deteriorated likely due to increased stressors.  Since higher dose remeron ineffective, will wean off of this and start prozac.  She is aware that all antidepressants have potential to lower seizure threshold.  She will  monitor.  2. Seizure disorder Stable on dilantin.  Dr. Sherryll Burger is monitoring levels.    3. HTN (hypertension) Deteriorated, likely worsened by #1.  Will increase dose of Ziac slightly (only beta blocker component) due to palpitations.  Follow up in 2 weeks. The patient indicates understanding of these issues and agrees with the plan.

## 2012-10-16 NOTE — Patient Instructions (Signed)
Good to see you. We are increasing your blood pressure medication to Ziac 5-6.25.  STOP taking your current blood pressure medication. Either come see me in 2 weeks or check your blood pressure at work and call me with readings.  Wean off Remeron- 15 mg every other night for week.  Then take 1/2 tablet (7.5 mg ) every other night for one week and stop. Ok to go ahead and start prozac.

## 2012-12-03 ENCOUNTER — Telehealth: Payer: Self-pay | Admitting: Family Medicine

## 2012-12-03 NOTE — Telephone Encounter (Signed)
Patient Information:  Caller Name: Mertie  Phone: [phone]  Patient: [name] Phillips, [name] Phillips  Gender: Female  DOB: 01-07-1981  Age: 32 Years  PCP: Ruthe Mannan [facility])  Pregnant: No  Office Follow Up:  Does the office need to follow up with this patient?: Yes  Instructions For The Office: Please follow up with pt regarding RX.  THANK YOU. Pt uses [facility]  RN Note:  Appt declined.  Caller w/o a job at this time and is requesting an Advertising account executive.  RN did advise of standing orders.  Caller states she can't make an appt until next week.   Symptoms  Reason For Call & Symptoms: UTI sx. Afebrile.  Dysuria and urinary frequency. Bladder discomfort 6/10 when standing and relieved when lying down.  Afebrile.   Reviewed Health History In EMR: Yes  Reviewed Medications In EMR: Yes  Reviewed Allergies In EMR: Yes  Reviewed Surgeries / Procedures: Yes  Date of Onset of Symptoms: 11/29/2012  Treatments Tried: Urostat. Ibuprofen  Treatments Tried Worked: No OB / GYN:  LMP: 11/16/2012  Guideline(s) Used:  Urination Pain - Female  Disposition Per Guideline:   See Today in Office  Reason For Disposition Reached:   Painful urination AND EITHER frequency or urgency  Advice Given:  Fluids:   Drink extra fluids. Drink 8-10 glasses of liquids a day (Reason: to produce a dilute, non-irritating urine).  Warm Saline SITZ Baths to Reduce Pain:  Sit in a warm saline bath for 20 minutes to cleanse the area and to reduce pain. Add 2 oz. of table salt or baking soda to a tub of water.  Call Back If:  You become worse.  Patient Refused Recommendation:  Patient Requests Prescription  Pt declined appt.

## 2012-12-03 NOTE — Telephone Encounter (Signed)
Unfortunately cannot treat with UA.  If she cannot afford appt, she can drop off UA.

## 2012-12-04 NOTE — Telephone Encounter (Signed)
Advised patient she will need appt in order to get antibiotic, since Dr. Dayton Martes is out of the office, but she says she's feeling better today. She will seek care if symptoms worsen.

## 2013-01-02 ENCOUNTER — Emergency Department: Payer: Self-pay | Admitting: Emergency Medicine

## 2013-01-02 LAB — ETHANOL
Ethanol %: 0.492 % (ref 0.000–0.080)
Ethanol: 492 mg/dL

## 2013-01-02 LAB — BASIC METABOLIC PANEL
Anion Gap: 13 (ref 7–16)
BUN: 12 mg/dL (ref 7–18)
Calcium, Total: 9.6 mg/dL (ref 8.5–10.1)
Co2: 22 mmol/L (ref 21–32)
Creatinine: 0.63 mg/dL (ref 0.60–1.30)
EGFR (Non-African Amer.): >60
Glucose: 98 mg/dL (ref 65–99)
Sodium: 145 mmol/L (ref 136–145)

## 2013-01-02 LAB — CBC WITH DIFFERENTIAL/PLATELET
Basophil %: 1.4 %
Lymphocyte #: 2.8 10*3/uL (ref 1.0–3.6)
MCHC: 34.7 g/dL (ref 32.0–36.0)
MCV: 112 fL — ABNORMAL HIGH (ref 80–100)
Neutrophil #: 3.5 10*3/uL (ref 1.4–6.5)
RDW: 15.3 % — ABNORMAL HIGH (ref 11.5–14.5)
WBC: 7.4 10*3/uL (ref 3.6–11.0)

## 2013-01-02 LAB — DRUG SCREEN, URINE
Barbiturates, Ur Screen: NEGATIVE (ref ?–200)
Benzodiazepine, Ur Scrn: NEGATIVE (ref ?–200)
Cocaine Metabolite,Ur ~~LOC~~: NEGATIVE (ref ?–300)
MDMA (Ecstasy)Ur Screen: NEGATIVE (ref ?–500)
Opiate, Ur Screen: NEGATIVE (ref ?–300)
Phencyclidine (PCP) Ur S: NEGATIVE (ref ?–25)
Tricyclic, Ur Screen: NEGATIVE (ref ?–1000)

## 2013-01-02 LAB — URINALYSIS, COMPLETE
Bacteria: NONE SEEN
Bilirubin,UR: NEGATIVE
Glucose,UR: NEGATIVE mg/dL (ref 0–75)
Ketone: NEGATIVE
Nitrite: NEGATIVE
Ph: 6 (ref 4.5–8.0)
Protein: NEGATIVE
RBC,UR: NONE SEEN /HPF (ref 0–5)
Squamous Epithelial: 1
WBC UR: NONE SEEN /HPF (ref 0–5)

## 2013-01-02 LAB — PHENYTOIN LEVEL, TOTAL: Dilantin: 5.4 ug/mL — ABNORMAL LOW (ref 10.0–20.0)

## 2013-01-28 ENCOUNTER — Inpatient Hospital Stay: Payer: Self-pay | Admitting: Family Medicine

## 2013-01-28 LAB — COMPREHENSIVE METABOLIC PANEL
Albumin: 3.6 g/dL (ref 3.4–5.0)
Alkaline Phosphatase: 249 U/L — ABNORMAL HIGH (ref 50–136)
Anion Gap: 12 (ref 7–16)
BUN: 8 mg/dL (ref 7–18)
Calcium, Total: 8.3 mg/dL — ABNORMAL LOW (ref 8.5–10.1)
Chloride: 101 mmol/L (ref 98–107)
Creatinine: 0.7 mg/dL (ref 0.60–1.30)
EGFR (African American): >60
EGFR (Non-African Amer.): >60
Glucose: 158 mg/dL — ABNORMAL HIGH (ref 65–99)
SGPT (ALT): 226 U/L — ABNORMAL HIGH (ref 12–78)
Sodium: 134 mmol/L — ABNORMAL LOW (ref 136–145)
Total Protein: 7.1 g/dL (ref 6.4–8.2)

## 2013-01-28 LAB — LIPASE, BLOOD: Lipase: 685 U/L — ABNORMAL HIGH (ref 73–393)

## 2013-01-28 LAB — URINALYSIS, COMPLETE
Blood: NEGATIVE
Ketone: NEGATIVE
Nitrite: NEGATIVE
Ph: 5 (ref 4.5–8.0)
Protein: 30
RBC,UR: 1 /HPF (ref 0–5)
WBC UR: 9 /HPF (ref 0–5)

## 2013-01-28 LAB — CBC
HCT: 44.3 % (ref 35.0–47.0)
HGB: 15.7 g/dL (ref 12.0–16.0)
MCHC: 35.5 g/dL (ref 32.0–36.0)
MCV: 107 fL — ABNORMAL HIGH (ref 80–100)
RDW: 14.2 % (ref 11.5–14.5)
WBC: 17.5 10*3/uL — ABNORMAL HIGH (ref 3.6–11.0)

## 2013-01-28 LAB — MAGNESIUM: Magnesium: 1.3 mg/dL — ABNORMAL LOW

## 2013-01-29 LAB — URINALYSIS, COMPLETE
Bacteria: NONE SEEN
Bilirubin,UR: NEGATIVE
Glucose,UR: NEGATIVE mg/dL (ref 0–75)
Ketone: NEGATIVE
Leukocyte Esterase: NEGATIVE
Nitrite: NEGATIVE
Ph: 8 (ref 4.5–8.0)
Protein: NEGATIVE
Specific Gravity: 1.004 (ref 1.003–1.030)
Squamous Epithelial: 3
WBC UR: 1 /HPF (ref 0–5)

## 2013-01-29 LAB — CBC WITH DIFFERENTIAL/PLATELET
Basophil #: 0 10*3/uL (ref 0.0–0.1)
Eosinophil #: 0.1 10*3/uL (ref 0.0–0.7)
Eosinophil %: 1 %
HCT: 37.8 % (ref 35.0–47.0)
HGB: 13.2 g/dL (ref 12.0–16.0)
MCH: 37.8 pg — ABNORMAL HIGH (ref 26.0–34.0)
Monocyte %: 6.1 %
Neutrophil #: 10.1 10*3/uL — ABNORMAL HIGH (ref 1.4–6.5)
RDW: 14.4 % (ref 11.5–14.5)
WBC: 12.4 10*3/uL — ABNORMAL HIGH (ref 3.6–11.0)

## 2013-01-29 LAB — COMPREHENSIVE METABOLIC PANEL
Albumin: 2.6 g/dL — ABNORMAL LOW (ref 3.4–5.0)
Anion Gap: 5 — ABNORMAL LOW (ref 7–16)
BUN: 4 mg/dL — ABNORMAL LOW (ref 7–18)
Calcium, Total: 7.3 mg/dL — ABNORMAL LOW (ref 8.5–10.1)
Creatinine: 0.53 mg/dL — ABNORMAL LOW (ref 0.60–1.30)
EGFR (African American): >60
Glucose: 96 mg/dL (ref 65–99)
Osmolality: 265 (ref 275–301)
Potassium: 4.3 mmol/L (ref 3.5–5.1)
Sodium: 134 mmol/L — ABNORMAL LOW (ref 136–145)

## 2013-01-29 LAB — MAGNESIUM: Magnesium: 2.1 mg/dL

## 2013-01-29 LAB — LIPASE, BLOOD: Lipase: 309 U/L (ref 73–393)

## 2013-01-30 LAB — CBC WITH DIFFERENTIAL/PLATELET
Basophil #: 0 10*3/uL (ref 0.0–0.1)
HGB: 11.5 g/dL — ABNORMAL LOW (ref 12.0–16.0)
Lymphocyte #: 0.9 10*3/uL — ABNORMAL LOW (ref 1.0–3.6)
Lymphocyte %: 10.1 %
MCH: 37.3 pg — ABNORMAL HIGH (ref 26.0–34.0)
MCV: 110 fL — ABNORMAL HIGH (ref 80–100)
Monocyte #: 0.8 x10 3/mm (ref 0.2–0.9)
Monocyte %: 8.6 %
Neutrophil #: 7.3 10*3/uL — ABNORMAL HIGH (ref 1.4–6.5)
Neutrophil %: 79.6 %
Platelet: 96 10*3/uL — ABNORMAL LOW (ref 150–440)
RBC: 3.09 10*6/uL — ABNORMAL LOW (ref 3.80–5.20)
RDW: 14.1 % (ref 11.5–14.5)

## 2013-01-30 LAB — COMPREHENSIVE METABOLIC PANEL
Albumin: 2.3 g/dL — ABNORMAL LOW (ref 3.4–5.0)
Alkaline Phosphatase: 157 U/L — ABNORMAL HIGH (ref 50–136)
Anion Gap: 6 — ABNORMAL LOW (ref 7–16)
Bilirubin,Total: 1.8 mg/dL — ABNORMAL HIGH (ref 0.2–1.0)
Calcium, Total: 7.3 mg/dL — ABNORMAL LOW (ref 8.5–10.1)
Co2: 22 mmol/L (ref 21–32)
Creatinine: 0.54 mg/dL — ABNORMAL LOW (ref 0.60–1.30)
EGFR (African American): >60
Glucose: 93 mg/dL (ref 65–99)
Osmolality: 269 (ref 275–301)
Potassium: 3.4 mmol/L — ABNORMAL LOW (ref 3.5–5.1)
SGOT(AST): 60 U/L — ABNORMAL HIGH (ref 15–37)
Total Protein: 5.5 g/dL — ABNORMAL LOW (ref 6.4–8.2)

## 2013-01-30 LAB — LIPASE, BLOOD: Lipase: 219 U/L (ref 73–393)

## 2013-01-31 LAB — URINE CULTURE

## 2013-02-10 ENCOUNTER — Other Ambulatory Visit: Payer: Self-pay | Admitting: *Deleted

## 2013-02-10 MED ORDER — BISOPROLOL-HYDROCHLOROTHIAZIDE 5-6.25 MG PO TABS: 1.0000 | ORAL_TABLET | Freq: Every day | ORAL | Status: DC

## 2013-02-24 ENCOUNTER — Other Ambulatory Visit: Payer: Self-pay | Admitting: Surgery

## 2013-02-24 LAB — COMPREHENSIVE METABOLIC PANEL
Alkaline Phosphatase: 81 U/L (ref 50–136)
BUN: 5 mg/dL — ABNORMAL LOW (ref 7–18)
Co2: 28 mmol/L (ref 21–32)
EGFR (African American): >60
Osmolality: 275 (ref 275–301)
Sodium: 139 mmol/L (ref 136–145)

## 2013-02-24 LAB — CBC WITH DIFFERENTIAL/PLATELET
Basophil #: 0.1 10*3/uL (ref 0.0–0.1)
Basophil %: 1.4 %
Eosinophil %: 1.9 %
HGB: 13.3 g/dL (ref 12.0–16.0)
Lymphocyte #: 2 10*3/uL (ref 1.0–3.6)
Lymphocyte %: 24.3 %
MCHC: 35.3 g/dL (ref 32.0–36.0)
Monocyte #: 0.6 x10 3/mm (ref 0.2–0.9)
Monocyte %: 7.3 %
Neutrophil #: 5.4 10*3/uL (ref 1.4–6.5)
Neutrophil %: 65.1 %
Platelet: 249 10*3/uL (ref 150–440)

## 2013-03-04 ENCOUNTER — Ambulatory Visit: Payer: Self-pay | Admitting: Surgery

## 2013-03-04 LAB — HEPATIC FUNCTION PANEL A (ARMC)
Albumin: 3.8 g/dL (ref 3.4–5.0)
Alkaline Phosphatase: 75 U/L (ref 50–136)
Bilirubin, Direct: <0.1 mg/dL (ref 0.00–0.20)
Bilirubin,Total: 0.2 mg/dL (ref 0.2–1.0)
SGOT(AST): 15 U/L (ref 15–37)
SGPT (ALT): 24 U/L (ref 12–78)
Total Protein: 6.9 g/dL (ref 6.4–8.2)

## 2013-03-05 LAB — PATHOLOGY REPORT

## 2013-06-12 ENCOUNTER — Emergency Department: Payer: Self-pay | Admitting: Internal Medicine

## 2013-06-12 LAB — CBC
HGB: 14.2 g/dL (ref 12.0–16.0)
MCH: 33 pg (ref 26.0–34.0)
MCHC: 34.1 g/dL (ref 32.0–36.0)
MCV: 97 fL (ref 80–100)
Platelet: 378 10*3/uL (ref 150–440)
RBC: 4.3 10*6/uL (ref 3.80–5.20)
RDW: 15.2 % — ABNORMAL HIGH (ref 11.5–14.5)

## 2013-06-12 LAB — URINALYSIS, COMPLETE
Bacteria: NONE SEEN
Bilirubin,UR: NEGATIVE
Nitrite: NEGATIVE
Ph: 5 (ref 4.5–8.0)
Protein: NEGATIVE

## 2013-06-12 LAB — COMPREHENSIVE METABOLIC PANEL
Anion Gap: 12 (ref 7–16)
BUN: 15 mg/dL (ref 7–18)
Bilirubin,Total: 1.7 mg/dL — ABNORMAL HIGH (ref 0.2–1.0)
Calcium, Total: 8.5 mg/dL (ref 8.5–10.1)
Chloride: 98 mmol/L (ref 98–107)
EGFR (African American): >60
EGFR (Non-African Amer.): >60
Osmolality: 269 (ref 275–301)
Potassium: 4.6 mmol/L (ref 3.5–5.1)
SGPT (ALT): 1188 U/L — ABNORMAL HIGH (ref 12–78)
Total Protein: 6.6 g/dL (ref 6.4–8.2)

## 2013-06-12 LAB — LIPASE, BLOOD: Lipase: 254 U/L (ref 73–393)

## 2013-06-17 ENCOUNTER — Ambulatory Visit: Payer: Self-pay | Admitting: Gastroenterology

## 2013-06-22 ENCOUNTER — Other Ambulatory Visit: Payer: Self-pay | Admitting: *Deleted

## 2013-06-22 MED ORDER — BISOPROLOL-HYDROCHLOROTHIAZIDE 5-6.25 MG PO TABS: 1.0000 | ORAL_TABLET | Freq: Every day | ORAL | Status: DC

## 2013-07-13 ENCOUNTER — Ambulatory Visit: Payer: BC Managed Care – PPO | Admitting: Family Medicine

## 2013-09-06 ENCOUNTER — Other Ambulatory Visit: Payer: Self-pay | Admitting: Family Medicine

## 2013-09-06 NOTE — Telephone Encounter (Signed)
Last office visit 10/16/2012.  Not on current medication list.  Refill?

## 2013-09-20 ENCOUNTER — Other Ambulatory Visit: Payer: Self-pay | Admitting: Family Medicine

## 2013-09-21 NOTE — Telephone Encounter (Signed)
Spoke to pt and informed her Rx has been faxed to requested pharmacy 

## 2013-10-05 ENCOUNTER — Emergency Department: Payer: Self-pay | Admitting: Emergency Medicine

## 2013-10-05 LAB — COMPREHENSIVE METABOLIC PANEL
ALBUMIN: 4.3 g/dL (ref 3.4–5.0)
Alkaline Phosphatase: 94 U/L
Anion Gap: 8 (ref 7–16)
BUN: 13 mg/dL (ref 7–18)
Bilirubin,Total: 0.9 mg/dL (ref 0.2–1.0)
CHLORIDE: 101 mmol/L (ref 98–107)
Calcium, Total: 9.1 mg/dL (ref 8.5–10.1)
Co2: 26 mmol/L (ref 21–32)
Creatinine: 0.61 mg/dL (ref 0.60–1.30)
EGFR (Non-African Amer.): >60
Glucose: 96 mg/dL (ref 65–99)
OSMOLALITY: 270 (ref 275–301)
Potassium: 3.5 mmol/L (ref 3.5–5.1)
SGOT(AST): 22 U/L (ref 15–37)
SGPT (ALT): 22 U/L (ref 12–78)
SODIUM: 135 mmol/L — AB (ref 136–145)
Total Protein: 8 g/dL (ref 6.4–8.2)

## 2013-10-05 LAB — URINALYSIS, COMPLETE
BILIRUBIN, UR: NEGATIVE
Bacteria: NONE SEEN
GLUCOSE, UR: NEGATIVE mg/dL (ref 0–75)
Leukocyte Esterase: NEGATIVE
NITRITE: NEGATIVE
Ph: 6 (ref 4.5–8.0)
Protein: NEGATIVE
RBC,UR: 1 /HPF (ref 0–5)
Specific Gravity: 1.006 (ref 1.003–1.030)
Squamous Epithelial: 5
WBC UR: 3 /HPF (ref 0–5)

## 2013-10-05 LAB — CBC WITH DIFFERENTIAL/PLATELET
BASOS PCT: 0.5 %
Basophil #: 0.1 10*3/uL (ref 0.0–0.1)
EOS ABS: 0.1 10*3/uL (ref 0.0–0.7)
Eosinophil %: 0.9 %
HCT: 43.1 % (ref 35.0–47.0)
HGB: 14.8 g/dL (ref 12.0–16.0)
LYMPHS ABS: 1.5 10*3/uL (ref 1.0–3.6)
LYMPHS PCT: 12.8 %
MCH: 34 pg (ref 26.0–34.0)
MCHC: 34.2 g/dL (ref 32.0–36.0)
MCV: 100 fL (ref 80–100)
MONO ABS: 0.7 x10 3/mm (ref 0.2–0.9)
Monocyte %: 6.2 %
NEUTROS PCT: 79.6 %
Neutrophil #: 9.5 10*3/uL — ABNORMAL HIGH (ref 1.4–6.5)
Platelet: 265 10*3/uL (ref 150–440)
RBC: 4.34 10*6/uL (ref 3.80–5.20)
RDW: 13.7 % (ref 11.5–14.5)
WBC: 11.9 10*3/uL — ABNORMAL HIGH (ref 3.6–11.0)

## 2013-10-05 LAB — LIPASE, BLOOD: Lipase: 147 U/L (ref 73–393)

## 2013-10-26 ENCOUNTER — Other Ambulatory Visit: Payer: Self-pay | Admitting: Family Medicine

## 2014-03-09 ENCOUNTER — Other Ambulatory Visit: Payer: Self-pay | Admitting: Family Medicine

## 2014-05-09 ENCOUNTER — Other Ambulatory Visit: Payer: Self-pay | Admitting: Family Medicine

## 2014-05-17 ENCOUNTER — Encounter: Payer: Self-pay | Admitting: Family Medicine

## 2014-05-17 ENCOUNTER — Ambulatory Visit (INDEPENDENT_AMBULATORY_CARE_PROVIDER_SITE_OTHER): Payer: BC Managed Care – PPO | Admitting: Family Medicine

## 2014-05-17 VITALS — BP 154/98 | HR 100 | Temp 98.3°F | Wt 146.5 lb

## 2014-05-17 DIAGNOSIS — R109 Unspecified abdominal pain: Secondary | ICD-10-CM

## 2014-05-17 DIAGNOSIS — R300 Dysuria: Secondary | ICD-10-CM

## 2014-05-17 DIAGNOSIS — J069 Acute upper respiratory infection, unspecified: Secondary | ICD-10-CM

## 2014-05-17 LAB — POCT URINALYSIS DIPSTICK
Bilirubin, UA: NEGATIVE
Blood, UA: NEGATIVE
GLUCOSE UA: NEGATIVE
Ketones, UA: POSITIVE
Leukocytes, UA: NEGATIVE
Nitrite, UA: NEGATIVE
Protein, UA: NEGATIVE
Urobilinogen, UA: 0.2
pH, UA: 6

## 2014-05-17 MED ORDER — CIPROFLOXACIN HCL 500 MG PO TABS: 500.0000 mg | ORAL_TABLET | Freq: Two times a day (BID) | ORAL | Status: DC

## 2014-05-17 MED ORDER — HYDROCOD POLST-CHLORPHEN POLST 10-8 MG/5ML PO LQCR: 5.0000 mL | Freq: Every evening | ORAL | Status: DC | PRN

## 2014-05-17 NOTE — Progress Notes (Signed)
Subjective:    Patient ID: [name] Phillips, female    DOB: Oct 31, 1980, 33 y.o.   MRN: [id number]  HPI  Very pleasant 33 yo female here for:  1.  UTI?- two days of dysuria and suprapubic pain after urination.  No fevers.  No back pain. No hematuria.  No nausea or vomiting.  2.  URI- over past few days, has developed a sore throat, dry cough.  Could not sleep last night due to cough.  No CP or SOB. No fevers. Taking Mucinex OTC. She has cut back on smoking- smoking 1 1/2 cig per day on average. Current Outpatient Prescriptions on File Prior to Visit  Medication Sig Dispense Refill  . ALPRAZolam (XANAX) 0.5 MG tablet ONE 3 TIMES A DAY AS NEEDED FOR ANXIETY OR SLEEP 90 tablet 0  . mirtazapine (REMERON) 15 MG tablet TAKE ONE TABLET TWICE DAILY 60 tablet 0  . Multiple Vitamin (MULTIVITAMIN) tablet Take 1 tablet by mouth daily.    . phenytoin (DILANTIN) 200 MG ER capsule Take 200 mg by mouth daily.     No current facility-administered medications on file prior to visit.    Allergies  Allergen Reactions  . Chantix [Varenicline Tartrate] Anxiety    No past medical history on file.  No past surgical history on file.  Family History  Problem Relation Age of Onset  . Hyperlipidemia Mother   . Hypertension Mother   . COPD Father   . Heart disease Father   . Hypertension Father   . Cancer Maternal Aunt     breast  . Alcohol abuse Other     History   Social History  . Marital Status: Married    Spouse Name: N/A    Number of Children: 2  . Years of Education: N/A   Occupational History  . Peridontal assistant    Social History Main Topics  . Smoking status: Current Every Day Smoker  . Smokeless tobacco: Not on file  . Alcohol Use: Not on file  . Drug Use: Not on file  . Sexual Activity: Not on file   Other Topics Concern  . Not on file   Social History Narrative   The PMH, PSH, Social History, Family History, Medications, and allergies have been reviewed in  [facility], and have been updated if relevant.   Review of Systems  Constitutional: Negative.   Respiratory: Positive for cough. Negative for shortness of breath, wheezing and stridor.   Cardiovascular: Negative.   Genitourinary: Positive for dysuria. Negative for hematuria, flank pain and difficulty urinating.  Psychiatric/Behavioral: Negative.   All other systems reviewed and are negative.      Objective:   Physical Exam  Constitutional: She appears well-developed and well-nourished. No distress.  HENT:  Head: Normocephalic.  Mouth/Throat: Posterior oropharyngeal edema present. No oropharyngeal exudate, posterior oropharyngeal erythema or tonsillar abscesses.  Cardiovascular: Normal rate.   Pulmonary/Chest: Effort normal and breath sounds normal. No respiratory distress. She has no wheezes. She has no rales. She exhibits no tenderness.  Abdominal: Soft. Bowel sounds are normal. She exhibits no distension. There is no tenderness. There is no rebound and no guarding.  Psychiatric: She has a normal mood and affect. Her speech is normal and behavior is normal. Judgment and thought content normal. Cognition and memory are normal.  Nursing note and vitals reviewed.   BP 154/98 mmHg  Pulse 100  Temp(Src) 98.3 F (36.8 C) (Oral)  Wt 146 lb 8 oz (66.452 kg)  SpO2 97%  LMP 05/11/2014       Assessment & Plan:

## 2014-05-17 NOTE — Patient Instructions (Signed)
Great to see you. Take cipro as directed- 1 tablet twice daily for 3 days.  Tussionex as needed at bedtime for cough.

## 2014-05-17 NOTE — Assessment & Plan Note (Signed)
New- UA neg but does have classic symptoms of UTI. Will treat with 3 day course of cipro twice daily. Call or return to clinic prn if these symptoms worsen or fail to improve as anticipated. The patient indicates understanding of these issues and agrees with the plan.

## 2014-05-17 NOTE — Progress Notes (Signed)
Pre visit review using our clinic review tool, if applicable. No additional management support is needed unless otherwise documented below in the visit note. 

## 2014-05-17 NOTE — Assessment & Plan Note (Signed)
New- likely viral. Exam benign and reassuring. Advised supportive care- tussionex as needed at bedtime for cough. Encouraged her to quit smoking. Call or return to clinic prn if these symptoms worsen or fail to improve as anticipated. The patient indicates understanding of these issues and agrees with the plan.

## 2014-05-18 ENCOUNTER — Telehealth: Payer: Self-pay | Admitting: Family Medicine

## 2014-05-18 LAB — URINE CULTURE

## 2014-05-18 NOTE — Telephone Encounter (Signed)
emmi emailed °

## 2014-06-02 ENCOUNTER — Ambulatory Visit (INDEPENDENT_AMBULATORY_CARE_PROVIDER_SITE_OTHER): Payer: BC Managed Care – PPO | Admitting: Family Medicine

## 2014-06-02 ENCOUNTER — Other Ambulatory Visit: Payer: BC Managed Care – PPO

## 2014-06-02 ENCOUNTER — Encounter: Payer: Self-pay | Admitting: *Deleted

## 2014-06-02 ENCOUNTER — Encounter: Payer: Self-pay | Admitting: Family Medicine

## 2014-06-02 VITALS — BP 122/70 | HR 90 | Temp 98.2°F | Ht 64.25 in | Wt 146.0 lb

## 2014-06-02 DIAGNOSIS — G40909 Epilepsy, unspecified, not intractable, without status epilepticus: Secondary | ICD-10-CM

## 2014-06-02 DIAGNOSIS — Z0000 Encounter for general adult medical examination without abnormal findings: Secondary | ICD-10-CM

## 2014-06-02 DIAGNOSIS — F17200 Nicotine dependence, unspecified, uncomplicated: Secondary | ICD-10-CM

## 2014-06-02 DIAGNOSIS — I10 Essential (primary) hypertension: Secondary | ICD-10-CM

## 2014-06-02 DIAGNOSIS — Z720 Tobacco use: Secondary | ICD-10-CM

## 2014-06-02 DIAGNOSIS — F411 Generalized anxiety disorder: Secondary | ICD-10-CM

## 2014-06-02 LAB — CBC WITH DIFFERENTIAL/PLATELET
Basophils Absolute: 0 10*3/uL (ref 0.0–0.1)
Basophils Relative: 0.4 % (ref 0.0–3.0)
EOS PCT: 1.5 % (ref 0.0–5.0)
Eosinophils Absolute: 0.2 10*3/uL (ref 0.0–0.7)
HEMATOCRIT: 45.4 % (ref 36.0–46.0)
Hemoglobin: 15 g/dL (ref 12.0–15.0)
Lymphocytes Relative: 17.7 % (ref 12.0–46.0)
Lymphs Abs: 1.8 10*3/uL (ref 0.7–4.0)
MCHC: 33.2 g/dL (ref 30.0–36.0)
MCV: 99.9 fl (ref 78.0–100.0)
MONO ABS: 0.8 10*3/uL (ref 0.1–1.0)
Monocytes Relative: 7.6 % (ref 3.0–12.0)
NEUTROS PCT: 72.8 % (ref 43.0–77.0)
Neutro Abs: 7.3 10*3/uL (ref 1.4–7.7)
Platelets: 293 10*3/uL (ref 150.0–400.0)
RBC: 4.54 Mil/uL (ref 3.87–5.11)
RDW: 13.4 % (ref 11.5–15.5)
WBC: 10 10*3/uL (ref 4.0–10.5)

## 2014-06-02 LAB — COMPREHENSIVE METABOLIC PANEL
ALBUMIN: 4.6 g/dL (ref 3.5–5.2)
ALK PHOS: 95 U/L (ref 39–117)
ALT: 31 U/L (ref 0–35)
AST: 26 U/L (ref 0–37)
BILIRUBIN TOTAL: 0.9 mg/dL (ref 0.2–1.2)
BUN: 10 mg/dL (ref 6–23)
CO2: 25 mEq/L (ref 19–32)
Calcium: 9.8 mg/dL (ref 8.4–10.5)
Chloride: 105 mEq/L (ref 96–112)
Creatinine, Ser: 0.6 mg/dL (ref 0.4–1.2)
GFR: 130.69 mL/min (ref >60.00)
Glucose, Bld: 103 mg/dL — ABNORMAL HIGH (ref 70–99)
Potassium: 4.1 mEq/L (ref 3.5–5.1)
SODIUM: 138 meq/L (ref 135–145)
TOTAL PROTEIN: 7.5 g/dL (ref 6.0–8.3)

## 2014-06-02 LAB — LIPID PANEL
CHOL/HDL RATIO: 3
CHOLESTEROL: 214 mg/dL — AB (ref 0–200)
HDL: 80.8 mg/dL (ref >39.00)
LDL CALC: 113 mg/dL — AB (ref 0–99)
NonHDL: 133.2
Triglycerides: 103 mg/dL (ref 0.0–149.0)
VLDL: 20.6 mg/dL (ref 0.0–40.0)

## 2014-06-02 LAB — TSH: TSH: 1.92 u[IU]/mL (ref 0.35–4.50)

## 2014-06-02 MED ORDER — ALPRAZOLAM 0.5 MG PO TABS: ORAL_TABLET | ORAL | Status: DC

## 2014-06-02 NOTE — Patient Instructions (Signed)
Great to see you. Happy Holidays!  We will call you with your lab results and you can view them online. 

## 2014-06-02 NOTE — Assessment & Plan Note (Signed)
Well controlled. No changes made today. 

## 2014-06-02 NOTE — Assessment & Plan Note (Signed)
Has been seizure free for 2 years. Advised follow up with neuro. Check dilantin level today.

## 2014-06-02 NOTE — Assessment & Plan Note (Signed)
Reviewed preventive care protocols, scheduled due services, and updated immunizations Discussed nutrition, exercise, diet, and healthy lifestyle.  Orders Placed This Encounter  Procedures  . CBC with Differential  . Comprehensive metabolic panel  . Lipid panel  . TSH  . Phenytoin level, total

## 2014-06-02 NOTE — Progress Notes (Signed)
Pre visit review using our clinic review tool, if applicable. No additional management support is needed unless otherwise documented below in the visit note. 

## 2014-06-02 NOTE — Assessment & Plan Note (Signed)
Well controlled without rx. 

## 2014-06-02 NOTE — Assessment & Plan Note (Signed)
Counseled- not ready to quit.

## 2014-06-02 NOTE — Progress Notes (Signed)
Subjective:   Patient ID: [name] Hashimotohristy A Phillips, female    DOB: 01/10/1981, 33 y.o.   MRN: [id number]  [name] Phillips is a pleasant 33 y.o. year old female who presents to clinic today with Annual Exam  on 06/02/2014  HPI: 33 yo pleasant female here for CPX.  Has GYN ( Dr. Haskel KhanVanDalen)- per pt, UTD pap smear.  BP has been under much better control.  Likes her job- denies HA, blurred vision CP or SOB.  Anxiety has also been under good control.  Seizure d/o- [facility]  Was told last year she could stop dilantin but she is afraid to stop it. Has not had a seizure in 2 years.  Smoking 3/4 ppd- not ready to quit. Current Outpatient Prescriptions on File Prior to Visit  Medication Sig Dispense Refill  . mirtazapine (REMERON) 15 MG tablet TAKE ONE TABLET TWICE DAILY 60 tablet 0  . Multiple Vitamin (MULTIVITAMIN) tablet Take 1 tablet by mouth daily.    . phenytoin (DILANTIN) 200 MG ER capsule Take 200 mg by mouth daily.     No current facility-administered medications on file prior to visit.    Allergies  Allergen Reactions  . Chantix [Varenicline Tartrate] Anxiety    No past medical history on file.  No past surgical history on file.  Family History  Problem Relation Age of Onset  . Hyperlipidemia Mother   . Hypertension Mother   . COPD Father   . Heart disease Father   . Hypertension Father   . Cancer Maternal Aunt     breast  . Alcohol abuse Other     History   Social History  . Marital Status: Married    Spouse Name: N/A    Number of Children: 2  . Years of Education: N/A   Occupational History  . Peridontal assistant    Social History Main Topics  . Smoking status: Current Every Day Smoker  . Smokeless tobacco: Not on file  . Alcohol Use: Not on file  . Drug Use: Not on file  . Sexual Activity: Not on file   Other Topics Concern  . Not on file   Social History Narrative   The PMH, PSH, Social History, Family History,  Medications, and allergies have been reviewed in [facility], and have been updated if relevant.     Review of Systems  Constitutional: Negative.   HENT: Negative.   Eyes: Negative.   Respiratory: Negative.   Cardiovascular: Negative.   Gastrointestinal: Negative.   Endocrine: Negative.   Genitourinary: Negative.   Musculoskeletal: Negative.   All other systems reviewed and are negative.      Objective:    BP 122/70 mmHg  Pulse 90  Temp(Src) 98.2 F (36.8 C) (Oral)  Ht 5' 4.25" (1.632 m)  Wt 146 lb (66.225 kg)  BMI 24.86 kg/m2  SpO2 98%  LMP 05/11/2014  Wt Readings from Last 3 Encounters:  06/02/14 146 lb (66.225 kg)  05/17/14 146 lb 8 oz (66.452 kg)  10/16/12 142 lb (64.411 kg)    Physical Exam  Constitutional: She is oriented to person, place, and time. She appears well-developed and well-nourished. No distress.  HENT:  Head: Normocephalic.  Eyes: Pupils are equal, round, and reactive to light.  Neck: Normal range of motion. Neck supple.  Cardiovascular: Normal rate and regular rhythm.   Pulmonary/Chest: Effort normal and breath sounds normal. No respiratory distress. She has no wheezes.  Abdominal: Soft. Bowel sounds are normal.  She exhibits no distension. There is no tenderness.  Musculoskeletal: Normal range of motion.  Neurological: She is alert and oriented to person, place, and time. No cranial nerve deficit.  Skin: Skin is warm and dry.  Nursing note and vitals reviewed.         Assessment & Plan:   Essential hypertension  Well woman exam (no gynecological exam)  Generalized anxiety disorder  CIGARETTE SMOKER No Follow-up on file.

## 2014-06-03 ENCOUNTER — Telehealth: Payer: Self-pay | Admitting: Family Medicine

## 2014-06-03 LAB — PHENYTOIN LEVEL, TOTAL

## 2014-06-03 NOTE — Telephone Encounter (Signed)
emmi emailed °

## 2014-07-02 ENCOUNTER — Other Ambulatory Visit: Payer: Self-pay | Admitting: Family Medicine

## 2014-07-19 ENCOUNTER — Other Ambulatory Visit: Payer: Self-pay | Admitting: Family Medicine

## 2014-09-05 ENCOUNTER — Other Ambulatory Visit: Payer: Self-pay | Admitting: Family Medicine

## 2014-10-19 ENCOUNTER — Other Ambulatory Visit: Payer: Self-pay | Admitting: Family Medicine

## 2014-10-19 NOTE — Telephone Encounter (Signed)
Rx called in to requested pharmacy 

## 2014-10-19 NOTE — Telephone Encounter (Signed)
Last f/u appt 05/2014 

## 2014-11-04 NOTE — Op Note (Signed)
PATIENT NAME:  [name] SenateGRAHAM, Faelyn MR#:  [id number] DATE OF BIRTH:  [birth year]  DATE OF PROCEDURE:  03/04/2013  PREOPERATIVE DIAGNOSIS: Biliary pancreatitis history with known gallstones.   POSTOPERATIVE DIAGNOSIS: Biliary pancreatitis history with known gallstones.   PROCEDURE: Laparoscopic cholecystectomy with C-arm fluoroscopic cholangiography.   SURGEON: Holbert Caples E. Excell Seltzerooper, MD  ANESTHESIA: General with endotracheal tube,  ASSISTANT: __________ , PA-S.   INDICATIONS: This is a patient with a history of biliary pancreatitis. She is here for elective laparoscopic cholecystectomy with cholangiograms. Preoperatively, we discussed the rationale for surgery, the options of observation, risk of bleeding, infection, recurrence of symptoms, failure to resolve her symptoms, open procedure, bile duct damage, bile duct leak, retained common bile duct stone, any of which could require further surgery and/or ERCP, stent and papillotomy. This was all reviewed for her and her significant other in the preop holding area.   FINDINGS: Small black-colored stones in the cystic duct. Cystic duct was fairly long. No intraluminal filling defects identified. Good flow in the duodenum. Proximal ducts were identified. The cystic duct had been cannulated and was fairly long at the site of division.   DESCRIPTION OF PROCEDURE: The patient was induced to general anesthesia, given IV antibiotics. VTE prophylaxis was in place. She was prepped and draped in a sterile fashion. Marcaine was infiltrated in skin and subcutaneous tissues around the periumbilical area. Incision was made. Veress needle was placed. Pneumoperitoneum was obtained, and a 5 mm trocar port was placed. The abdominal cavity was explored, and under direct vision, a 10 mm epigastric port and 2 lateral 5 mm ports were placed. The gallbladder was placed on tension. Peritoneum over the infundibulum was incised bluntly. The cystic duct and gallbladder junction was well  identified. The cystic artery was well identified. The cystic duct was clipped and incised where it entered the infundibulum of the gallbladder, and then through a separate incision, an Angiocath cholangiogram catheter was placed. C-arm fluoroscopic cholangiography demonstrated the above. The catheter was removed, and the cystic duct was milked of several small black stones. The cystic duct was then doubly clipped and divided, and the cystic artery was doubly clipped and divided. The gallbladder was taken from the gallbladder fossa with electrocautery and passed out through the epigastric port site with the aid of an Endo Catch bag. The area was checked for hemostasis and found to be adequate. There was no sign of bleeding, bile leak or bowel injury. The camera was placed in the epigastric site to view back to the periumbilical site. There were no adhesions and no sign of bowel injury. No further problems with hemostasis. Therefore, pneumoperitoneum was released. All ports were removed. Fascial edges at the epigastric site were approximated with 0 Vicryl figure-of-eight sutures. The 4-0 subcuticular Monocryl was used at all skin edges. Steri-Strips, Mastisol and sterile dressings were placed.   The patient tolerated the procedure well. There were no complications. She was taken to the recovery room in stable condition to be discharged to the care of her family. Follow up in 10 days.   ____________________________ Adah Salvageichard E. Excell Seltzerooper, MD rec:OSi D: 03/04/2013 08:21:57 ET T: 03/04/2013 08:51:36 ET JOB#: [id number]  cc: Adah Salvageichard E. Excell Seltzerooper, MD, <Dictator> Lattie HawICHARD E Quinnlyn Hearns MD ELECTRONICALLY SIGNED 03/04/2013 10:47

## 2014-11-04 NOTE — Consult Note (Signed)
PATIENT NAME:  [name] Phillips, [name] Phillips MR#:  [id number] DATE OF BIRTH:  [birth year]  DATE OF CONSULTATION:  01/28/2013  REFERRING PHYSICIAN:  Dr. Karlene Lineman CONSULTING PHYSICIAN:  Rodman Key, NP/Dr. Lutricia Feil  REASON FOR CONSULTATION: Gallstone pancreatitis.   HISTORY OF PRESENT ILLNESS: [name] Phillips is a 34 year old Caucasian female who presented to [facility] Emergency Room earlier this morning for the concern of severe abdominal pain to entire abdomen and generalized radiating around to her back bilaterally. Onset of pain acute, Tuesday of this week. She is unable to exactly elaborate about which she was doing or what time, but does state that it was constant, dull achy pain, which has just exacerbated to being sharp in nature. Associated nausea she vomited 5 times since Tuesday. No vomiting since being hospitalized. No evidence of emesis. No fevers. Currently receiving Norco for pain, which has not helped to relieve the pain, currently a 10 on a scale of 1 to 10. She did receive Dilaudid in the Emergency Room and did allow the pain to go away for a couple hours in length. Appetite has been decreased. No similar episodes to this in the past. Bowels move on average every day. Over the last couple of days though has been mild constipation. Small bowel movement yesterday, last episodes of defecation. Has taken ibuprofen as needed. Associated heartburn Tuesday took over-the-counter acid reducer, which is not help in relieving her pain. No family members with history of gallbladder disease. She does have a significant medical history of seizure disorder, which was diagnosed 2011 felt to be secondary to stress and exhaustion. She had three seizures that day none since,  hypertension.   PAST MEDICAL HISTORY: Seizure disorder, January 2014 diagnosed, hypertension.   PAST SURGICAL HISTORY: Unremarkable.   FAMILY HISTORY: Skin cancer, mother with high blood pressure, hypercholesteremia.  N colorectal cancer or other forms of  neoplasm otherwise mentioned.    SOCIAL HISTORY:  Was smoking 1-1/4 packs a day as weaned to 3/4 pack a day, occasional alcohol, did have two drinks last night. No recreational drug use. Married, unemployed, 2 biological sons and one stepson.   REVIEW OF SYSTEMS: All 10 systems have been reviewed and otherwise unremarkable other as stated above.   PHYSICAL EXAMINATION: VITAL SIGNS: Temperature 98.2 with a pulse of 100, respirations 18, blood pressure 140/93, pulse oximetry 92% on room air.  GENERAL: [name], well-nourished 34 year old Caucasian female, mild distress, noted uncomfortable due to pain, noted attempts to get comfortable.  HEENT: Normocephalic, atraumatic. Pupils equal, reactive to light. Conjunctiva clear. Sclerae anicteric.  NECK: Supple. Trachea midline. No lymphadenopathy or thyromegaly.  PULMONARY: Symmetric rise and fall of chest, scattered expiratory wheezes noted, right greater than left. Diminished breath sounds, but poor expiratory effort due to pain.  CARDIOVASCULAR: Regular rhythm, S1, S2. No murmurs, no gallops.  ABDOMEN: Soft, nondistended. Bowel sounds more audible on left side of abdomen, decreased right. Marked discomfort throughout entire abdomen, more localized to right upper quadrant. No evidence of hepatosplenomegaly.  RECTAL: Deferred.  MUSCULOSKELETAL: Moves four extremities. No contractures. No clubbing.  EXTREMITIES: No edema.  PSYCHIATRIC: Alert and oriented x 4. Memory grossly intact. Appropriate affect and mood.  NEUROLOGIC: No gross neurological deficits very.   LABORATORY, DIAGNOSTIC, AND RADIOLOGICAL DATA: Chemistry panel on admission: Glucose 158, potassium was low at 3.4 with a sodium of 134, calcium is 8.3, magnesium low at 1.3 lipase is 685. Hepatic panel: Total bilirubin 1.9. Alkaline phosphatase 249, AST is 208, ALT 226. WBC count elevated at 17.5,  hemoglobin 15.7 with hematocrit of 44.3.   Urinalysis: +1 bilirubin.   Abdominal  ultrasound revealed diffuse pancreatic edema. Fatty infiltration of liver. Portal vein is patent. No ascites. Gallstones noted. Negative Murphy's sign. Gallbladder thickening at 2.0 mm and common bile duct is 4.5 mm.   IMPRESSION:  1.  Gallstone pancreatitis.  2. New diagnosis of seizure disorder earlier this year.  3.  Hypomagnesium.  4.  Hypokalemia.   PLAN: The patient's presentation will be discussed with Dr. Lutricia FeilPaul Oh. At this time, do recommend changing oral pain management which order was for Norco to IV form, as the patient has been experiencing nausea. To remain nothing oral. Do recommend increasing IV fluid intake at this time. I will again discuss with Dr. Lutricia FeilPaul Oh. Laboratory studies in the morning. CBC, hepatic panel and metabolic panel recommended to be ordered as well as lipase for continued serial monitoring.   These services provided by Rodman Keyawn S. Jamorian Dimaria, MS, APRN, [facility], FNP, under collaborative agreement with Dr. Lutricia FeilPaul Oh.    ____________________________ Rodman Keyawn S. Heloise Gordan, NP dsh:cc D: 01/28/2013 13:07:53 ET T: 01/28/2013 15:21:19 ET JOB#: [id number]  cc: Rodman Keyawn S. Borden Thune, NP, <Dictator> Rodman KeyAWN S Julieth Tugman MD ELECTRONICALLY SIGNED 01/29/2013 15:20

## 2014-11-04 NOTE — H&P (Signed)
PATIENT NAME:  [name] SenateGRAHAM, Marabeth MR#:  [id number] DATE OF BIRTH:  18-Nov-1980  DATE OF ADMISSION:  03/04/2013  CHIEF COMPLAINT: Biliary pancreatitis.   HISTORY OF PRESENT ILLNESS: This is a patient with a history of biliary pancreatitis requiring hospitalization. She is here for elective laparoscopic cholecystectomy with cholangiography to decrease her risk for symptoms in the future. She was in the hospital approximately 6 weeks ago with a bout of biliary pancreatitis.   Currently, she is pain-free with no nausea or vomiting and no further attacks.   PAST MEDICAL HISTORY: Seizure disorder.   PAST SURGICAL HISTORY: None.   ALLERGIES: CHANTIX.   MEDICATIONS: Remeron, Dilantin and hydrochlorothiazide.   FAMILY HISTORY: Noncontributory.   SOCIAL HISTORY: She smokes tobacco. Does not drink alcohol.   REVIEW OF SYSTEMS: A 10 system review was performed and negative with the exception of that mentioned in the HPI.    PHYSICAL EXAMINATION:  GENERAL: Healthy-appearing female patient.  NECK: No palpable neck nodes.  CHEST: Clear to auscultation.  CARDIAC: Regular rate and rhythm.  ABDOMEN: Soft, nontender.  EXTREMITIES: Without edema.  NEUROLOGIC: Grossly intact.  INTEGUMENT: No jaundice.   RADIOLOGY: An ultrasound shows stones. CT scan showed pancreatitis.   LABORATORY VALUES: Demonstrate normal liver function tests and lipase at this time.   ASSESSMENT AND PLAN: This is a patient with a history of biliary pancreatitis. Here for elective laparoscopic cholecystectomy. The rationale for this has been discussed. The options of observation have been reviewed, and the risks of bleeding, infection, recurrence of symptoms, failure to resolve her symptoms, open procedure, bile duct damage, bile duct leak, retained common bile duct stone, any of which could require further surgery and/or ERCP, stent and papillotomy have all been discussed. She understood and agreed to proceed.    ____________________________ Adah Salvageichard E. Excell Seltzerooper, MD rec:gb D: 03/03/2013 22:57:42 ET T: 03/03/2013 23:06:59 ET JOB#: [id number]  cc: Adah Salvageichard E. Excell Seltzerooper, MD, <Dictator> Lattie HawICHARD E Aashritha Miedema MD ELECTRONICALLY SIGNED 03/04/2013 10:47

## 2014-11-04 NOTE — H&P (Signed)
PATIENT NAME:  [name] Phillips, Narya MR#:  [id number] DATE OF BIRTH:  1981/02/11  DATE OF ADMISSION:  07/20/2012  PRIMARY CARE PHYSICIAN:  Ruthe Mannanalia Aron.  CHIEF COMPLAINT: "I had 3 seizures."   HISTORY OF PRESENT ILLNESS: This is a 34 year old female with past medical history of hypertension, anxiety and depression. She presents with 3 seizures today, the first 1 happened while she was at home where the kids witnessed it. She had some foaming at the mouth and a tongue bite. She called her doctor, went in to see the doctor, had the second 1 at the doctor's office, and then was sent in for further evaluation. In the Emergency Room, at the CAT scanner, the patient had a third seizure. The ER physician went over and witnessed a tonic-clonic seizure. No loss of urine, but positive for tongue bite. She did have foaming at the mouth with the 2 previous episodes.  In further questioning of the patient, she has been very tired. She has had a lot of trouble sleeping, a lot of stress. She quit her job. She is not sleeping and then had the holidays. In the ER, a CT scan of the head was negative. Chest x-ray negative. Urine toxicology was negative. Potassium was at 3.4. Normal white count, no fever. Hospitalist services were contacted for further evaluation.   PAST MEDICAL HISTORY:  Hypertension, anxiety, depression.   PAST SURGICAL HISTORY:  None.  ALLERGIES: CHANTIX.   MEDICATIONS:  Include bisoprolol/hydrochlorothiazide 2.5/6.25, 1 tablet daily.   SOCIAL HISTORY: Smokes 1 pack per day for 6 years. Sporadic alcohol, last was on Saturday. No drug use. 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   FAMILY HISTORY: Father with COPD and posttraumatic stress disorder. Mother with hypertension and hyperlipidemia.   REVIEW OF SYSTEMS:  CONSTITUTIONAL: Positive for sweats. No fever or chills. Positive for weakness, total body. No weight gain. No weight loss.  EYES: Fuzzy vision today bilaterally, none  prior.  EARS, NOSE, MOUTH AND THROAT: Positive for ears popping up and decreased hearing, especially in the right ear.  CARDIOVASCULAR: No chest pain. Positive for palpitations.  RESPIRATORY: No shortness of breath. Positive for cough. No sputum. No hemoptysis.  GASTROINTESTINAL: No nausea. Positive for possible vomiting versus spitting up with the seizure. No abdominal pain. No diarrhea. No constipation. No bright red blood per rectum. No melena.  GENITOURINARY: No burning on urination, no hematuria.  MUSCULOSKELETAL: Positive for knee pain which is chronic.  NEUROLOGIC: No fainting or blackouts.  PSYCHIATRIC: Positive for anxiety, depression and insomnia.  ENDOCRINE: No thyroid problems.  HEMATOLOGIC AND LYMPHATIC: No anemia, no easy bruising or bleeding.   PHYSICAL EXAMINATION: VITAL SIGNS: Temperature 98.3, pulse 115, respirations 20, blood pressure 140/72, pulse ox 99% on room air.  GENERAL: No respiratory distress.  EYES: Conjunctivae normal. Lids normal. Pupils equal, round and reactive to light. Extraocular muscles intact. Positive nystagmus when looking to the left.  EARS, NOSE, MOUTH AND THROAT: Tympanic membrane on the left bulging and erythematous. Tympanic membrane on the right bulging and it does look like some cloudy material behind there. Membrane looks yellow. Nasal mucosa no erythema. Throat no erythema, no exudate seen. Lips and gums no lesions.  NECK: No JVD. No bruits. No lymphadenopathy. No thyromegaly. No thyroid nodules palpated.  RESPIRATORY: Lungs clear to auscultation. No use of accessory muscles to breathe. No rhonchi, rales or wheeze heard.  CARDIOVASCULAR SYSTEM: S1, S2, tachycardic. No gallops, rubs or murmurs heard. Carotid upstroke 2+ bilaterally. No bruits.  EXTREMITIES: Dorsalis pedis pulses 2+ bilaterally. No edema of the lower extremity.  ABDOMEN: Soft, nontender. No organomegaly/splenomegaly. Normoactive bowel sounds. No masses felt.  LYMPHATIC: No lymph  nodes in the neck.  MUSCULOSKELETAL: No clubbing, edema or cyanosis.  NECK: No pain in the neck. Negative Brudzinski's, negative Kernig's sign.  SKIN: No rash or ulcers seen.  NEUROLOGIC: Cranial nerves II through XII grossly intact. Deep tendon reflexes 2+ bilateral lower extremity. Babinski negative. Power 5 out of 5 upper and lower extremities.  PSYCHIATRIC: The patient is oriented to person, place and time.   LABORATORY AND RADIOLOGIC DATA: CT scan of the head without contrast was negative. Chest x-ray negative. Urinalysis 2+ blood, trace ketones, 150 mg/dL of glucose. Urine toxicology negative. Glucose 119, BUN 9, creatinine 0.47, sodium 135, potassium 3.4, chloride 101, CO2 21, calcium 9.0. Liver function tests: Total bilirubin 1.4, alkaline phosphatase 95, ALT 63, AST 103, total protein 78. White blood cell count 8.9, H and H 13.4 and 38.4, platelet count 153. Ethanol level 3.   ASSESSMENT AND PLAN: 1.  Seizure with no history of seizure I think the precipitating factor is that she has been having trouble sleeping and had a lot of stress. We will admit as an observation, obtain an MRI of the brain with and without contrast to rule out structural abnormalities. Will obtain an EEG. Neurology consult with Dr. Sherryll Burger. The patient was loaded with fosphenytoin 1.5 grams, will continue Dilantin 300 mg daily. Will check a Dilantin level in the a.m.  2.  Otitis media. Will give Augmentin 875 mg b.i.d. No signs of meningitis at this time.  3.  Hypokalemia. Will replace oral potassium. Check a magnesium and a phosphorus. Stop hydrochlorothiazide.  4.  Hypertension and tachycardia. Will change bisoprolol to metoprolol 25 mg b.i.d.  5.  Anxiety, depression and stress. Will try to help the patient's sleep by starting Remeron 15 mg at bedtime.  6.  Tobacco abuse. A nicotine patch p.r.n. Smoking cessation counseling done, 3 minutes by me.  7.  Impaired fasting glucose. Will check a hemoglobin A1c in the a.m.    TIME SPENT ON ADMISSION: 55 minutes. Case discussed with Dr. Sherryll Burger, neurology.    ____________________________ Herschell Dimes. Renae Gloss, MD rjw:cs D: 07/20/2012 16:55:44 ET T: 07/20/2012 19:48:42 ET JOB#: [id number]  cc: Herschell Dimes. Renae Gloss, MD, <Dictator> Bryn Gulling. Dayton Martes, MD  Salley Scarlet MD ELECTRONICALLY SIGNED 07/30/2012 19:59

## 2014-11-04 NOTE — Consult Note (Signed)
Brief Consult Note: Diagnosis: biliary pancreatitis.   Patient was seen by consultant.   Consult note dictated.   Recommend further assessment or treatment.   Comments: observation to resolution of pancreatitis followed by LC/grams. May need ERCP if LFT/lipase do not normalize.  Electronic Signatures: Lattie Hawooper, Richard E (MD)  (Signed 17-Jul-14 17:40)  Authored: Brief Consult Note   Last Updated: 17-Jul-14 17:40 by Lattie Hawooper, Richard E (MD)

## 2014-11-04 NOTE — Consult Note (Signed)
Brief Consult Note: Diagnosis: Gallstone pancreatitis.  Hypomagnesium.  Hypokalemia.   Discussed with Attending MD.   Comments: Patient's presentation will be discussed with Dr. Lutricia FeilPaul Oh.  Recommend pain management to be changed from oral route to IV form.  Decrease the chance of nausea and vomiting on empty stomach.  NPO status.  Continued serial monitoring of CBC, CMP, and lipase.  Surgical consultation.  Will continue to monitor.  Increase IV fluids for the next 24 -48 hours.  Electronic Signatures: Rodman KeyHarrison, Dawn S (NP)  (Signed 17-Jul-14 13:09)  Authored: Brief Consult Note   Last Updated: 17-Jul-14 13:09 by Rodman KeyHarrison, Dawn S (NP)

## 2014-11-04 NOTE — Consult Note (Signed)
Pt seen and examined. Please see Dawn Harrison's notes. Pt still with abdominal and back pain. Lipase elevated. LFT elevated but CBD normal on U/S. Needs IV pain meds, NPO, and aggressive IV hydration. Will eventually need GB removed. Surgery consult ordered. Discussed possible ERCP only if LFT remain elevated or go up higher. Will follow. thanks.  Electronic Signatures: Lutricia Feilh, Fatou Dunnigan (MD)  (Signed on 17-Jul-14 15:31)  Authored  Last Updated: 17-Jul-14 15:31 by Lutricia Feilh, Breyona Swander (MD)

## 2014-11-04 NOTE — Discharge Summary (Signed)
PATIENT NAME:  [name] Phillips, [name] Phillips MR#:  [id number] DATE OF BIRTH:  Dec 12, 1980  DATE OF ADMISSION:  07/20/2012 DATE OF DISCHARGE:  07/21/2012  REASON FOR ADMISSION: Seizures #3.   PRIMARY CARE PHYSICIAN: [name] Gullingalia M. Dayton MartesAron, MD  DISPOSITION: Home.   FOLLOWUP: [name] K. Sherryll BurgerShah, MD of Neurology.   MEDICATIONS AT DISCHARGE: Include bisoprolol with hydrochlorothiazide 2.5/6.25 mg once daily, phenytoin 200 mg extended-release at bedtime, amoxicillin with clavulanic 875/125 mg oral tablet take one twice daily for 7 days, mirtazapine 15 mg once at bedtime.   DISCHARGE DIAGNOSES:  1. Seizures, likely induced from lack of sleep.  2. Otitis media.  3. Hypokalemia.  4. Hypertension.  5. Tachycardia, the patient states this is a chronic problem, has been followed by her primary care physician for this and she is on a beta blocker.  6. Anxiety/depression.  7. Stress.  8. Insomnia.  9. Tobacco abuse.  10. Impaired fasting glucose.  11. Thrombocytopenia.   HOSPITAL COURSE: The patient is a very nice 34 year old female who has history of hypertension. She is followed by Dr. Ruthe Mannanalia Phillips [facility].   The patient apparently has been complaining of insomnia, depression, anxiety and stress. She has not slept for several days very well and the day of the event, the patient did not sleep the whole night.   She woke up with one episode of nausea and vomiting that she was not really much aware of. Apparently, she just woke up with vomiting around her. Her 2611 and 36 year old kids were around her and they mentioned to her that she had an episode where she was very stiff and then started shaking. When she woke up, she had bitten her tongue.   The patient was feeling confused afterwards for which she went to visit her primary care physician. She was referred to the ER where she had a CT scan of the head. During the time she was in the CT scan, she had another seizure that was witnessed.   She had a total of  3 seizures.   The patient was loaded with fosphenytoin, no more seizures after that she was doing okay. There were no signs of CNS infection. No LP was done. Dr. Cristopher PeruHemang Shah, neurologist, saw the patient and decided to just keep her on Dilantin at 200 mg every night.   The patient did very well without any major side effects of the medication other than nystagmus whenever she was being loaded with the fosphenytoin. There were no more seizures. The patient underwent an MRI of the brain to rule out any mass or other abnormalities and the impression was unremarkable pre- and post-contrast brain MRI.   The patient also had a several labs that showed different abnormalities: 1. Low potassium at admission 3.4, the following day 2.9 and at discharge 3.6.  2. Her creatinine was 0.37. Her phosphorous was decreased at 2.1 after given 50 millimoles of phosphorous. The initial phosphorus was 1.5. Her magnesium was 1.5 at admission.  3. Total bilirubin was 1.4 and AST was 103, likely due to alcohol versus liver steatosis.  4. Her TSH was 1.76, her UDS was negative and she had a decrease of platelets from 153 to 113 after IV fluids were given versus consumption.  5. UA negative. EKG sinus tachycardia.   The patient was evaluated again by Dr. Sherryll Phillips on the 7th and from his point of view, he decided to send her home.   I asked the patient if she wanted to stay  another day to resolve some of her issues, electrolyte abnormalities and evaluate a little bit more her tachycardia. She states that her primary care physician has been working with these issues in the past and that she would rather go home and follow up with her.   The patient is discharged in a good condition.   Discharge time and visit during the day, about 50 minutes.    ____________________________ Felipa Furnace, MD rsg:es D: 07/22/2012 07:19:32 ET T: 07/22/2012 08:51:43 ET JOB#: [id number]  cc: Felipa Furnace, MD,  <Dictator> [name] Gulling. Dayton Martes, MD [name] K. Sherryll Burger, MD Regan Rakers Juanda Chance MD ELECTRONICALLY SIGNED 07/24/2012 13:46

## 2014-11-04 NOTE — Consult Note (Signed)
PATIENT NAME:  [name], [name] MR#:  [id number] DATE OF BIRTH:  [birth year]  DATE OF CONSULTATION:  01/28/2013  CONSULTING PHYSICIAN:  Adah Salvageichard E. Excell Seltzerooper, MD  CHIEF COMPLAINT: Abdominal pain.   HISTORY OF PRESENT ILLNESS: This is a patient with a 2-1/2-day history of abdominal pain. She cannot localize it to any part of her abdomen, but states it also radiates through to her back. Workup has shown probable biliary pancreatitis. I was asked to see the patient for surgical opinions.   The patient describes multiple episodes to a lesser degree in the past. Denies jaundice or acholic stools. No fevers or chills. Again, she cannot localize the pain to any area in her abdomen in particular, but states that it does radiate through to her back.   PAST MEDICAL HISTORY: Anxiety and depression and seizure disorder.   PAST SURGICAL HISTORY: None.   ALLERGIES: None.   FAMILY HISTORY: Noncontributory.   SOCIAL HISTORY: The patient smokes tobacco products and drinks daily 2 or 3 beers per day and is not employed at this time.   REVIEW OF SYSTEMS: A 10-system review is performed and negative with the exception of that mentioned in the history of present illness.   PHYSICAL EXAMINATION:  GENERAL: Healthy, comfortable-appearing female patient.   VITAL SIGNS: Stable. She is afebrile.   HEENT: No scleral icterus.   NECK: No palpable neck nodes.   CHEST: Clear to auscultation.   CARDIAC: Regular rate and rhythm.   ABDOMEN: Soft, nondistended, minimally tender in all quadrants, maximally in the right upper quadrant.   EXTREMITIES: Without edema.   NEUROLOGIC: Grossly intact.   INTEGUMENT: No jaundice.   White blood cell count 17.5, hemoglobin and hematocrit 15.7 and 44 with a platelet count of 155. Potassium is 3.4, sodium 134, bilirubin of 1.98, AST and ALT of 208 and 226, respectively. Lipase 685.  Ultrasound shows pancreatic edema and cholelithiasis.   ASSESSMENT AND PLAN: This is a patient  with likely biliary pancreatitis. She is admitted to the hospital and I was asked to see the patient for management decision making. At this point, we would like to see her lipase returns towards normal and liver function test improved before planning a laparoscopic cholecystectomy with cholangiography. I will follow the patient in the hospital.   ____________________________ Adah Salvageichard E. Excell Seltzerooper, MD rec:rw D: 01/28/2013 17:54:46 ET T: 01/28/2013 18:51:20 ET JOB#: [id number]  cc: Adah Salvageichard E. Excell Seltzerooper, MD, <Dictator> Lattie HawICHARD E Margel Joens MD ELECTRONICALLY SIGNED 01/29/2013 9:16

## 2014-11-04 NOTE — Consult Note (Signed)
PATIENT NAME:  [name] Phillips, STEINKE MR#:  [id number] DATE OF BIRTH:  [birth year]  DATE OF CONSULTATION:  07/20/2012  CONSULTING PHYSICIAN:  Phu Record K. Sherryll Burger, MD  REFERRING PHYSICIAN:  Dr. Renae Gloss.   REASON FOR CONSULTATION: Seizure.   HISTORY OF PRESENT ILLNESS:  The patient is a 34 year old Caucasian female without history of any seizure disorder. Had insomnia and did not sleep the whole night, which is a chronic problem. Woke up with vomitus all around her and her kids aged 17 and 57, were staring at her around 9:00 in the morning. They mentioned that she had a seizure-like episode where she became very stiff, and she might have bitten her tongue.   The patient felt confused afterwards.   They went to her family physician, Dr. Ruthe Mannan at [facility], and had another seizure with a similar semiology and got transferred to the ER.   Around 3:30 p.m., the patient had another seizure around the CT scanner.   The patient was loaded with fosphenytoin 20 mg/kg per report.   The patient was noted to have some nystagmus, questionable fever, and she has a history of recurrent otitis media, so was admitted to the hospital for further workup.   The patient did not have any white cell count and no significant neck rigidity and negative neurological exam. The patient was not tapped (lumbar puncture was not done).   The patient denied any history suggestive of early morning myoclonic jerk. Her birth was normal. Her development was okay. She started up being a Sales executive.   The patient does not have a family history of epilepsy.   The patient does have 4 cafe au lait spots.   PAST MEDICAL HISTORY:  Significant for hypertension.  PAST SURGICAL HISTORY: Negative.   FAMILY HISTORY: Significant that mother has hypertension. Father has emphysema, COPD and PTSD and coronary artery disease.  She has 3 siblings and 2 kids. They are healthy.   SOCIAL HISTORY: Significant that she lives with  her husband and kids. She smokes a pack a day, starting at the age of 54.  She occasionally drinks alcohol. Does not do recreational drugs.   ALLERGIES AND MEDICATIONS: Were reviewed.  REVIEW OF SYSTEMS: Positive for recent seizure, tongue bite, feeling achy all over. The other 10 system review of system was asked and was found to be negative.   PHYSICAL EXAMINATION: VITAL SIGNS: Temperature was 99, pulse 119, respiratory rate 18, blood pressure 110/80, pulse ox 98% on room air.  GENERAL: She is a young, Caucasian female, lying in the bed, not in acute distress, surrounded by family members. She was eating when I entered the room.  She has generalized tremors and feeling of shaking.  She feels very anxious due to recent seizures where she does not have a history of seizures. The patient was well developed, well nourished, appropriate for her age. The   patient smelled of cigarette smoke. She does have a cafe au lait spot on her right hand and 3 other places.  MENTAL STATUS: She was alert, oriented, followed two-step inverted commands. Her language seems to be intact. She does not have dysarthria. She does not have any neglect.  Her attention and concentration seems to be appropriate, except she was a little bit anxious.  Cranial Nerves: Her pupils were equal, round and reactive. Extraocular movements showed nystagmus, which changed with gaze, probably more on the left compared to the right.  It was horizontal.  Her face was symmetric. Tongue was  midline. Facial sensations were intact. Her hearing was intact.  On her motor exam, she has normal tone and strength. She has mild tremors. She has mild myalgia.  Her reflexes were 2+. Her sensations were intact to light touch. I did not check her gait.   RADIOLOGICAL DATA: Review of CT scan of the head was unremarkable.   ASSESSMENT AND PLAN:  New onset of 3 unprovoked seizures (except sleep deprivation, she does not have any known risk factors).   I agree  with starting her on seizure medication, fosphenytoin 20 mg/kg. Continue maintenance dose with pharmacy to change the dose according to the blood level.   The patient needs to have replacement of her phos and magnesium.   I talked to the patient about etiology, pathogenesis and progression of seizures. I talked to her about seizure precautions such as no driving for at least 6 months, avoid swimming by herself, no walking on a high elevation by herself, not working on any heavy machinery, etc.   The patient and family members were educated on first date of seizures such as do not put anything in the mouth, lie the patient down on the floor, turn on the side to avoid any aspiration.   Do not hold the limbs down. Call 911 if the seizure lasts for more than 5 minutes, or the patient has a cluster seizure.   I talked to the patient about the long-term side effects of Dilantin, including osteoporosis, so she should take calcium and vitamin D supplements.   The patient also can  develop gingiva hyperplasia, which she is very worried about. She should take folic acid supplement, which is known to reduce gingival hyperplasia from Dilantin.   The patient's current nystagmus and tremors might be coming from fosphenytoin load.   If the patient does not develop anymore seizures down the road, we can wean her off of her antiepileptic medication over 2 years or so.   I can change her AED over to newer generation AED in future on an outpatient basis.   I agree with doing MRI of the brain, epilepsy protocol (with and without contrast with GRE sequence, as well as FLAIR sequence through coronal cuts looking for mediotemporal sclerosis).   We will also do EEG. I will follow this patient with you. Feel free to contact me with any further questions. I gave her a reference of epilepsy.com, as well as [email or web address].      ____________________________ Elyzabeth Goatley K. Sherryll BurgerShah, MD hks:dm D: 07/20/2012 18:32:53  ET T: 07/20/2012 21:20:06 ET JOB#: [id number]  cc: Kindall Swaby K. Sherryll BurgerShah, MD, <Dictator> Bryn Gullingalia M. Dayton MartesAron, MD Durene CalHEMANG K [facility] MD ELECTRONICALLY SIGNED 07/24/2012 6:55

## 2014-11-04 NOTE — H&P (Signed)
PATIENT NAME:  [name] Phillips, ROTENBERG MR#:  [id number] DATE OF BIRTH:  Jul 07, 1981  DATE OF ADMISSION:  01/28/2013  PRIMARY CARE PHYSICIAN:  Ruthe Mannan, MD  REQUESTING PHYSICIAN: Daryel November, MD   CHIEF COMPLAINT: Abdominal pain, nausea and vomiting.   HISTORY OF PRESENT ILLNESS: The patient is a 34 year old female with a known history of hypertension, anxiety and depression who is being admitted for acute pancreatitis. The patient started having generalized abdominal pain about 2 days ago associated with nausea and vomiting. She was not able to keep anything orally down. She was not able to manage anything at home and finally decided to come to the Emergency Department as she was doubling up in the pain and was starving. While in the ED, her pain was still 7-1/2 out of 10 and is being admitted for further evaluation and management as her lipase was elevated at 685 and abdominal ultrasound showed cholelithiasis with possible pancreatitis.   PAST MEDICAL HISTORY: 1.  Hypertension.  2.  Anxiety. 3.  Depression.   PAST SURGICAL HISTORY: None.   ALLERGIES: CHANTIX.   SOCIAL HISTORY: Has smoked 1/2 to 1 pack of cigarettes daily for the last 6 years.  Sporadic alcohol. No drug use. She is currently not working. She used to work for [employer].  FAMILY HISTORY: Father with COPD and posttraumatic stress disorder. Mother with hypertension and hyperlipidemia.   MEDICATIONS AT HOME: 1.  Remeron 15 mg p.o. at bedtime.  2.  Dilantin 100 mg 2 capsules p.o. daily.  3.  Bisoprolol/hydrochlorothiazide 5/6.25 mg 1 tablet p.o. daily.   REVIEW OF SYSTEMS: CONSTITUTIONAL: No fever. Positive fever, fatigue, weakness.  EYES: No blurred or double vision.  ENT: No tinnitus or ear pain.  RESPIRATORY: No cough or hemoptysis.  CARDIOVASCULAR: No chest pain, orthopnea, edema.  GASTROINTESTINAL: Positive for nausea and vomiting along with abdominal pain.  GENITOURINARY: No dysuria or hematuria.  ENDOCRINE: No  polyuria or nocturia.  HEMATOLOGY: No anemia or easy bruising.  SKIN: No rash or lesion.  MUSCULOSKELETAL: No arthritis or muscle cramp.  NEUROLOGIC: No tingling, numbness, weakness.  PSYCHIATRIC: Positive for anxiety and depression.   PHYSICAL EXAMINATION: VITAL SIGNS: Temperature 98.2, heart rate 106 per minute, respirations 18 per minute, blood pressure 120/86 mmHg She was saturating 98% in room air.  GENERAL: The patient is a 34 year old female lying in the bed comfortably without any acute distress.  EYES: Pupils equal, round and reactive to light and accommodation. No scleral icterus. Extraocular muscles intact.  HENT: Head atraumatic, normocephalic. Oropharynx and nasopharynx clear.  NECK: Supple. No jugular venous distention. No thyroid enlargement or tenderness.  LUNGS:   Clear to auscultation bilaterally. No wheezing, rales, rhonchi or crepitation.  HEART:  S1 and S2 normal. No murmurs or gallop.  ABDOMEN: Soft. She does have tenderness around periumbilical region and some in mid epigastrium. Normoactive bowel sounds. No organomegaly appreciated.  NEUROLOGIC: Cranial nerves II through XII intact. Muscle strength 5/5 in all extremities. Sensation intact.  PSYCHIATRIC: The patient is alert and oriented x 3.  SKIN:  No rash, lesion or ulcer.   LABORATORY AND DIAGNOSTICS: Normal BMP except sodium 134 and potassium 3.4.  Magnesium 1.3. Elevated liver function tests with lipase of 685, total bilirubin 1.9, alkaline phosphatase 249, AST 208, ALT 226.  Normal CBC except white count of 17.5. UA showed trace bacteria and 9 WBCs, otherwise negative. Urine pregnancy test was negative.   Abdominal ultrasound in the ED showed fatty liver. Cholelithiasis. Diffuse pancreatic edema. Pancreatitis cannot  be excluded.   IMPRESSION AND PLAN: 1.  Acute pancreatitis, possibly gallstone related, with elevated lipase and abdominal ultrasound showing and confirming same. We will consult GI. Keep her n.p.o.  except medication. Start her on IV fluids and provide symptomatic management. She may need surgical consult also for evaluation of gallbladder and gallstone. 2.  Hypokalemia/hypomagnesemia. We will replete and recheck. Likely due to ongoing nausea and vomiting.  3.  Leukocytosis. Likely due to pancreatitis. Will monitor.  4.  Seizure. We will continue Dilantin.  5.  Hypertension. We will continue her home medication.   CODE STATUS:  FULL CODE.  TOTAL TIME TAKING CARE OF THIS PATIENT: 55 minutes. ____________________________ Ellamae SiaVipul S. Sherryll BurgerShah, MD vss:sb D: 01/28/2013 15:00:14 ET T: 01/28/2013 15:33:12 ET JOB#: [id number]  cc: Lyndsee Casa S. Sherryll BurgerShah, MD, <Dictator> Bryn Gullingalia M. Dayton MartesAron, MD Ellamae SiaVIPUL S [facility] MD ELECTRONICALLY SIGNED 02/01/2013 13:10

## 2014-11-04 NOTE — Discharge Summary (Signed)
PATIENT NAME:  [name] Phillips, [name] Phillips MR#:  [id number] DATE OF BIRTH:  01-17-1981  DATE OF ADMISSION:  01/28/2013 DATE OF DISCHARGE:  01/30/2013  REASONS FOR ADMISSION: Abdominal pain, nausea and vomiting.   FINAL DIAGNOSES: 1.  Acute gallstone pancreatitis.  2.  Leukocytosis, secondary to pancreatitis.  3.  Hypokalemia.  4.  Hypomagnesemia.  5.  History of seizures.  6.  History of constipation.   IMPORTANT RESULTS: Sodium 134 on admission, glucose 157, magnesium 1.3. Lipase 683.   LFTs, with bilirubin of 1.9, alkaline phosphatase of 249, AST 208, ALT 226, trending down alkaline phosphatase at discharge 157, AST at discharge 60, ALT at discharge 85.   Her bilirubin at discharge was 1.8. White count on admission 17,000; at discharge 9.2, hemoglobin 11.5. At discharge platelet count down to 9096 at discharge   Urinalysis with 9 white blood cells, repeated only 1 white blood cell. Pregnancy test was negative.   Ultrasound of the abdomen shows diffuse pancreatic edema; pancreatitis cannot be excluded; cholelithiasis and positive liver infiltration.   DISPOSITION: Home.   MEDICATIONS AT DISCHARGE: Dilantin 100 mg 2 capsules once a day, bisoprolol with hydrochlorothiazide 5/6.25 mg once daily, Remeron 50 mg daily, acetaminophen with hydrocodone 5/325 mg every 4 hours as needed for pain, MiraLAX as needed for constipation.    FOLLOWUP: Dr. Excell Seltzerooper outpatient for arrangements for cholecystectomy.   Continue mechanical soft diet, bland diet for now. No fat. No alcohol.   HOSPITAL COURSE: [name] Phillips is a very nice 34 year old female who has a history of hypertension, anxiety and depression.   She has never had surgery. She is a smoker, and came to the Emergency Department on 01/28/2013 with a history of abdominal pain, nausea and vomiting. Apparently started having generalized abdominal pain 2 days prior to the admission, associated with nausea and vomiting, unable to keep anything down.   Was not  able to stay home for which they went to the Emergency Department. She was evaluated and she was found to have a mildly elevated lipase of 685, an ultrasound showing pancreatitis and cholelithiasis, and elevation of her LFTs.   Because of all of this general surgery was consulted and Dr. Excell Seltzerooper recommended that since this was likely biliary pancreatitis she needed to have a cholecystectomy, although the patient states that she had a job interview that was really important pending within the next 2 days for which she wanted to put off the surgery.   She was kept n.p.o. and her diet was advanced slowly. She actually did well with pain control. Her LFTs came down, as mentioned above on the lab results, and the patient was discharged in good condition. The patient is due to follow up with Dr. Excell Seltzerooper in the outpatient setting to schedule a cholecystectomy.   I spent about thirty-five minutes with this discharge.     ____________________________ Felipa Furnaceoberto Sanchez Gutierrez, MD rsg:dm D: 01/31/2013 06:55:50 ET T: 01/31/2013 14:45:27 ET JOB#: [id number]  cc: Felipa Furnaceoberto Sanchez Gutierrez, MD, <Dictator> Delainy Mcelhiney Juanda ChanceSANCHEZ GUTIERRE MD ELECTRONICALLY SIGNED 02/05/2013 20:46

## 2014-11-04 NOTE — Consult Note (Signed)
Chief Complaint:  Subjective/Chief Complaint Feeling better. Less abd/back pain. Hungry, which is a good sign. Lipase back to normal. LFT coming down slowly.   VITAL SIGNS/ANCILLARY NOTES: **Vital Signs.:   18-Jul-14 06:46  Vital Signs Type Routine  Temperature Temperature (F) 99.3  Celsius 37.3  Temperature Source oral  Pulse Pulse 95  Respirations Respirations 18  Systolic BP Systolic BP 887  Diastolic BP (mmHg) Diastolic BP (mmHg) 76  Mean BP 89  Pulse Ox % Pulse Ox % 98  Pulse Ox Activity Level  At rest  Oxygen Delivery Room Air/ 21 %   Brief Assessment:  GEN no acute distress   Cardiac Regular   Respiratory epigastric tenderness   Lab Results: Hepatic:  18-Jul-14 04:43   Bilirubin, Total  1.9  Alkaline Phosphatase  181  SGPT (ALT)  127  SGOT (AST)  115  Total Protein, Serum  5.8  Albumin, Serum  2.6  Routine Chem:  18-Jul-14 04:43   Lipase 309 (Result(s) reported on 29 Jan 2013 at 05:38AM.)  Glucose, Serum 96  BUN  4  Creatinine (comp)  0.53  Sodium, Serum  134  Potassium, Serum 4.3  Chloride, Serum 104  CO2, Serum 25  Calcium (Total), Serum  7.3  Osmolality (calc) 265  eGFR (African American) >60  eGFR (Non-African American) >60 (eGFR values <31m/min/1.73 m2 may be an indication of chronic kidney disease (CKD). Calculated eGFR is useful in patients with stable renal function. The eGFR calculation will not be reliable in acutely ill patients when serum creatinine is changing rapidly. It is not useful in  patients on dialysis. The eGFR calculation may not be applicable to patients at the low and high extremes of body sizes, pregnant women, and vegetarians.)  Anion Gap  5  Routine Hem:  18-Jul-14 04:43   WBC (CBC)  12.4  RBC (CBC)  3.48  Hemoglobin (CBC) 13.2  Hematocrit (CBC) 37.8  Platelet Count (CBC)  98  MCV  109  MCH  37.8  MCHC 34.8  RDW 14.4  Neutrophil % 81.3  Lymphocyte % 11.5  Monocyte % 6.1  Eosinophil % 1.0  Basophil % 0.1   Neutrophil #  10.1  Lymphocyte # 1.4  Monocyte # 0.7  Eosinophil # 0.1  Basophil # 0.0 (Result(s) reported on 29 Jan 2013 at 05:27AM.)   Assessment/Plan:  Assessment/Plan:  Assessment Gallstone pancreatitis. Gradually improving.   Plan Pt not anxious to have ERCP at this time since LFT improving.. Wants to eat. Will try clears today. Can do IOC at the time of GB surgery if LFT still elevated. If IOC positive, then can plan ERCP then. Dr. SGustavo Lahwill check on patient over the weekend. THanks.   Electronic Signatures: OVerdie Shire(MD)  (Signed 18-Jul-14 07:36)  Authored: Chief Complaint, VITAL SIGNS/ANCILLARY NOTES, Brief Assessment, Lab Results, Assessment/Plan   Last Updated: 18-Jul-14 07:36 by OVerdie Shire(MD)

## 2015-02-28 ENCOUNTER — Telehealth: Payer: Self-pay | Admitting: Family Medicine

## 2015-02-28 NOTE — Telephone Encounter (Signed)
Spoke with Dr. Caryn Section, she wanted to make sure that Dr. Dayton Martes received office note sent today 02/28/15, re: Pt's BP 153/108.  Called pt, her schedule is very busy and she initially just wanted a refill on bisoprolol-hydrochlorothiazide [facility]) 5-6.25 MG per tablet [[id number]] DISCONTINUED in 05/2014 by patient.  I explained that Dr. Dayton Martes would want to see her prior to prescribing any medications.  Offered multiple appointments including same day today and team health.  Pt declined.   Pt scheduled for 03/01/15 at 12 noon.

## 2015-02-28 NOTE — Telephone Encounter (Signed)
Noted! Thank you

## 2015-03-01 ENCOUNTER — Encounter: Payer: Self-pay | Admitting: Family Medicine

## 2015-03-01 ENCOUNTER — Ambulatory Visit (INDEPENDENT_AMBULATORY_CARE_PROVIDER_SITE_OTHER): Payer: BLUE CROSS/BLUE SHIELD | Admitting: Family Medicine

## 2015-03-01 VITALS — BP 148/102 | HR 93 | Temp 98.5°F | Wt 139.5 lb

## 2015-03-01 DIAGNOSIS — I10 Essential (primary) hypertension: Secondary | ICD-10-CM

## 2015-03-01 MED ORDER — BISOPROLOL-HYDROCHLOROTHIAZIDE 2.5-6.25 MG PO TABS: 1.0000 | ORAL_TABLET | Freq: Every day | ORAL | Status: DC

## 2015-03-01 NOTE — Progress Notes (Signed)
Pre visit review using our clinic review tool, if applicable. No additional management support is needed unless otherwise documented below in the visit note. 

## 2015-03-01 NOTE — Assessment & Plan Note (Signed)
Deteriorated. Restart ziac- tolerated this well previously. She will check BPs at home, call me in 2 weeks with an update, sooner if Bp consistently over 145/95. The patient indicates understanding of these issues and agrees with the plan.

## 2015-03-01 NOTE — Progress Notes (Signed)
Subjective:   Patient ID: [name] Phillips, female    DOB: Mar 05, 1981, 34 y.o.   MRN: [id number]  [name] Phillips is a pleasant 34 y.o. year old female who presents to clinic today with Follow-up and Hypertension  on 03/01/2015  HPI:  HTN- BP has been elevated at her psychiatrist's office. Previously was taking Ziac for hypertension but was able to wean off it once life stressors were better controlled.  Now with increased anxiety, blood pressures have been consistently elevated in 140s-150s/100s.  Denies HA, blurred vision, CP or SOB.  Lab Results  Component Value Date   CREATININE 0.6 06/02/2014    BP Readings from Last 3 Encounters:  03/01/15 148/102  06/02/14 122/70  05/17/14 154/98   Current Outpatient Prescriptions on File Prior to Visit  Medication Sig Dispense Refill  . ALPRAZolam (XANAX) 0.5 MG tablet TAKE ONE TABLET BY MOUTH 3 TIMES DAILY AS NEEDED FOR ANXIETY OR SLEEP (Patient taking differently: TAKE ONE TABLET BY mouth daily as  NEEDED FOR ANXIETY OR SLEEP) 90 tablet 0  . mirtazapine (REMERON) 15 MG tablet TAKE 1 TABLET TWICE DAILY 60 tablet 8  . Multiple Vitamin (MULTIVITAMIN) tablet Take 1 tablet by mouth daily.    . phenytoin (DILANTIN) 200 MG ER capsule Take 200 mg by mouth daily.     No current facility-administered medications on file prior to visit.    Allergies  Allergen Reactions  . Chantix [Varenicline Tartrate] Anxiety    No past medical history on file.  No past surgical history on file.  Family History  Problem Relation Age of Onset  . Hyperlipidemia Mother   . Hypertension Mother   . COPD Father   . Heart disease Father   . Hypertension Father   . Cancer Maternal Aunt     breast  . Alcohol abuse Other     Social History   Social History  . Marital Status: Married    Spouse Name: N/A  . Number of Children: 2  . Years of Education: N/A   Occupational History  . Peridontal assistant    Social History Main Topics  . Smoking  status: Current Every Day Smoker  . Smokeless tobacco: Not on file  . Alcohol Use: Not on file  . Drug Use: Not on file  . Sexual Activity: Not on file   Other Topics Concern  . Not on file   Social History Narrative   The PMH, PSH, Social History, Family History, Medications, and allergies have been reviewed in [facility], and have been updated if relevant.   Review of Systems  Constitutional: Negative.   HENT: Negative.   Eyes: Negative.   Respiratory: Negative.   Cardiovascular: Negative.   Musculoskeletal: Negative.   Skin: Negative.   Neurological: Negative.   Hematological: Negative.   Psychiatric/Behavioral: Negative.        Objective:    BP 148/102 mmHg  Pulse 93  Temp(Src) 98.5 F (36.9 C) (Oral)  Wt 139 lb 8 oz (63.277 kg)  SpO2 97%  LMP 02/17/2015   Physical Exam  Constitutional: She appears well-developed and well-nourished. No distress.  HENT:  Head: Normocephalic.  Eyes: Conjunctivae are normal.  Cardiovascular: Normal rate.   Pulmonary/Chest: Effort normal.  Musculoskeletal: Normal range of motion. She exhibits no edema.  Neurological: She is alert.  Skin: Skin is warm and dry.  Psychiatric: She has a normal mood and affect. Her behavior is normal. Judgment and thought content normal.  Nursing note and vitals reviewed.  Assessment & Plan:   Essential hypertension No Follow-up on file.

## 2015-05-04 ENCOUNTER — Ambulatory Visit (INDEPENDENT_AMBULATORY_CARE_PROVIDER_SITE_OTHER): Payer: BLUE CROSS/BLUE SHIELD | Admitting: Family Medicine

## 2015-05-04 VITALS — BP 108/74 | HR 91 | Temp 98.4°F | Wt 145.5 lb

## 2015-05-04 DIAGNOSIS — M5412 Radiculopathy, cervical region: Secondary | ICD-10-CM | POA: Diagnosis not present

## 2015-05-04 DIAGNOSIS — M25521 Pain in right elbow: Secondary | ICD-10-CM | POA: Diagnosis not present

## 2015-05-04 DIAGNOSIS — Z23 Encounter for immunization: Secondary | ICD-10-CM

## 2015-05-04 DIAGNOSIS — R202 Paresthesia of skin: Secondary | ICD-10-CM | POA: Insufficient documentation

## 2015-05-04 NOTE — Progress Notes (Signed)
Pre visit review using our clinic review tool, if applicable. No additional management support is needed unless otherwise documented below in the visit note. 

## 2015-05-04 NOTE — Assessment & Plan Note (Signed)
With decreased right grip strength. >25 minutes spent in face to face time with patient, >50% spent in counselling or coordination of care. Agree with ortho referral- placed but also refer to neurology for EMG. The patient indicates understanding of these issues and agrees with the plan.

## 2015-05-04 NOTE — Progress Notes (Signed)
Subjective:   Patient ID: [name] Phillips, female    DOB: [birth year], 34 y.o.   MRN: [id number]  [name] Phillips is a pleasant 34 y.o. year old female who presents to clinic today with her husband for Arm Pain and Neck Pain  on 05/04/2015  HPI: Brings with her a disc (c spine plain film) and letter from her chiropractor, Mitzie Na, asking for medical evaluation.  Per letter, she has been going to chiropractor since 02/20/15 with complaints of right elbow pain that neck pain and diagnosed with "military neck," cervical neuritis and possible radial injury.  She has received 10 total chiropractic visits with treatment including manipulation, adjustments and electric stimulation.  Given home exercises.  Per pt, 25 % improve from these treatments but still having symptoms.  Now tingling and pain her her hand- all fingers, is waking her up at night. No known injury.  Ibuprofen not helping much.  Current Outpatient Prescriptions on File Prior to Visit  Medication Sig Dispense Refill  . ALPRAZolam (XANAX) 0.5 MG tablet TAKE ONE TABLET BY MOUTH 3 TIMES DAILY AS NEEDED FOR ANXIETY OR SLEEP (Patient taking differently: TAKE ONE TABLET BY mouth daily as  NEEDED FOR ANXIETY OR SLEEP) 90 tablet 0  . bisoprolol-hydrochlorothiazide (ZIAC) 2.5-6.25 MG per tablet Take 1 tablet by mouth daily. 30 tablet 3  . mirtazapine (REMERON) 15 MG tablet TAKE 1 TABLET TWICE DAILY 60 tablet 8  . Multiple Vitamin (MULTIVITAMIN) tablet Take 1 tablet by mouth daily.    . phenytoin (DILANTIN) 200 MG ER capsule Take 200 mg by mouth daily.     No current facility-administered medications on file prior to visit.    Allergies  Allergen Reactions  . Chantix [Varenicline Tartrate] Anxiety    No past medical history on file.  No past surgical history on file.  Family History  Problem Relation Age of Onset  . Hyperlipidemia Mother   . Hypertension Mother   . COPD Father   . Heart disease Father   . Hypertension  Father   . Cancer Maternal Aunt     breast  . Alcohol abuse Other     Social History   Social History  . Marital Status: Married    Spouse Name: N/A  . Number of Children: 2  . Years of Education: N/A   Occupational History  . Peridontal assistant    Social History Main Topics  . Smoking status: Current Every Day Smoker  . Smokeless tobacco: Not on file  . Alcohol Use: Not on file  . Drug Use: Not on file  . Sexual Activity: Not on file   Other Topics Concern  . Not on file   Social History Narrative   The PMH, PSH, Social History, Family History, Medications, and allergies have been reviewed in [facility], and have been updated if relevant.   Review of Systems  Musculoskeletal: Negative for arthralgias.  Neurological: Positive for weakness and numbness. Negative for tremors.  Hematological: Negative.   Psychiatric/Behavioral: Negative.   All other systems reviewed and are negative.      Objective:    BP 108/74 mmHg  Pulse 91  Temp(Src) 98.4 F (36.9 C) (Oral)  Wt 145 lb 8 oz (65.998 kg)  SpO2 96%  LMP 05/03/2015   Physical Exam  Constitutional: She appears well-developed and well-nourished. No distress.  HENT:  Head: Normocephalic.  Eyes: Conjunctivae are normal.  Cardiovascular: Normal rate.   Pulmonary/Chest: Effort normal.  Musculoskeletal: Normal range of motion.  Neurological:  Right grip strength significantly decreased  Skin: Skin is warm and dry.  Psychiatric: She has a normal mood and affect. Her behavior is normal. Judgment and thought content normal.  Nursing note and vitals reviewed.         Assessment & Plan:   Need for influenza vaccination - Plan: Flu Vaccine QUAD 36+ mos PF IM (Fluarix & Fluzone Quad PF)  Cervical neuritis  Right elbow pain No Follow-up on file.

## 2015-05-04 NOTE — Patient Instructions (Signed)
Great to see you. Please stop by to see [name] Phillips on your way out.   

## 2015-05-08 ENCOUNTER — Other Ambulatory Visit: Payer: Self-pay | Admitting: Orthopedic Surgery

## 2015-05-08 DIAGNOSIS — M542 Cervicalgia: Secondary | ICD-10-CM

## 2015-05-16 ENCOUNTER — Other Ambulatory Visit: Payer: BLUE CROSS/BLUE SHIELD

## 2015-05-16 ENCOUNTER — Ambulatory Visit
Admission: RE | Admit: 2015-05-16 | Discharge: 2015-05-16 | Disposition: A | Discharge status: Home / Self Care | Payer: BLUE CROSS/BLUE SHIELD | Source: Ambulatory Visit | Attending: Orthopedic Surgery | Admitting: Orthopedic Surgery

## 2015-05-16 DIAGNOSIS — M542 Cervicalgia: Secondary | ICD-10-CM

## 2015-06-01 ENCOUNTER — Other Ambulatory Visit: Payer: Self-pay | Admitting: Family Medicine

## 2015-08-15 ENCOUNTER — Ambulatory Visit (INDEPENDENT_AMBULATORY_CARE_PROVIDER_SITE_OTHER): Payer: BLUE CROSS/BLUE SHIELD | Admitting: Family Medicine

## 2015-08-15 VITALS — BP 134/88 | HR 88 | Temp 98.3°F | Wt 151.2 lb

## 2015-08-15 DIAGNOSIS — J069 Acute upper respiratory infection, unspecified: Secondary | ICD-10-CM | POA: Diagnosis not present

## 2015-08-15 MED ORDER — HYDROCOD POLST-CPM POLST ER 10-8 MG/5ML PO SUER: 5.0000 mL | Freq: Two times a day (BID) | ORAL | Status: DC | PRN

## 2015-08-15 MED ORDER — AZITHROMYCIN 250 MG PO TABS: ORAL_TABLET | ORAL | Status: DC

## 2015-08-15 NOTE — Progress Notes (Signed)
SUBJECTIVE:  [name] Phillips is a 35 y.o. female who complains of coryza, congestion, sneezing, sore throat, productive cough and bilateral sinus pain for 10 days. She denies a history of anorexia and chest pain and denies a history of asthma. Patient admits to smoke cigarettes.   Current Outpatient Prescriptions on File Prior to Visit  Medication Sig Dispense Refill  . ALPRAZolam (XANAX) 0.5 MG tablet TAKE ONE TABLET BY MOUTH 3 TIMES DAILY AS NEEDED FOR ANXIETY OR SLEEP (Patient taking differently: TAKE ONE TABLET BY mouth daily as  NEEDED FOR ANXIETY OR SLEEP) 90 tablet 0  . bisoprolol-hydrochlorothiazide (ZIAC) 2.5-6.25 MG tablet TAKE ONE TABLET BY MOUTH EVERY DAY 30 tablet 6  . mirtazapine (REMERON) 15 MG tablet TAKE 1 TABLET TWICE DAILY 60 tablet 8  . Multiple Vitamin (MULTIVITAMIN) tablet Take 1 tablet by mouth daily.    . phenytoin (DILANTIN) 200 MG ER capsule Take 200 mg by mouth daily.     No current facility-administered medications on file prior to visit.    Allergies  Allergen Reactions  . Chantix [Varenicline Tartrate] Anxiety    No past medical history on file.  No past surgical history on file.  Family History  Problem Relation Age of Onset  . Hyperlipidemia Mother   . Hypertension Mother   . COPD Father   . Heart disease Father   . Hypertension Father   . Cancer Maternal Aunt     breast  . Alcohol abuse Other     Social History   Social History  . Marital Status: Married    Spouse Name: N/A  . Number of Children: 2  . Years of Education: N/A   Occupational History  . Peridontal assistant    Social History Main Topics  . Smoking status: Current Every Day Smoker  . Smokeless tobacco: Not on file  . Alcohol Use: Not on file  . Drug Use: Not on file  . Sexual Activity: Not on file   Other Topics Concern  . Not on file   Social History Narrative   The PMH, PSH, Social History, Family History, Medications, and allergies have been reviewed in [facility],  and have been updated if relevant.  OBJECTIVE:  BP 134/88 mmHg  Pulse 88  Temp(Src) 98.3 F (36.8 C) (Oral)  Wt 151 lb 4 oz (68.607 kg)  SpO2 98%  LMP 07/24/2015  She appears well, vital signs are as noted. Ears normal.  Throat and pharynx normal.  Neck supple. No adenopathy in the neck. Nose is congested. Sinuses tender. + scattered exp wheezes  ASSESSMENT:  sinusitis and bronchitis  PLAN: Given duration and progression of symptoms in a smoker with wheezes on exam, will treat for bacterial process with zpack.  Symptomatic therapy suggested: push fluids, rest and return office visit prn if symptoms persist or worsen.  Rx printed and given to pt for tussionex to use prn severe cough- discussed sedation precautions.  Call or return to clinic prn if these symptoms worsen or fail to improve as anticipated.

## 2015-08-15 NOTE — Progress Notes (Signed)
Pre visit review using our clinic review tool, if applicable. No additional management support is needed unless otherwise documented below in the visit note. 

## 2015-08-16 ENCOUNTER — Ambulatory Visit: Payer: BLUE CROSS/BLUE SHIELD | Admitting: Family Medicine

## 2015-08-31 ENCOUNTER — Telehealth: Payer: Self-pay | Admitting: Family Medicine

## 2015-08-31 MED ORDER — AZITHROMYCIN 250 MG PO TABS: ORAL_TABLET | ORAL | Status: DC

## 2015-08-31 NOTE — Telephone Encounter (Signed)
May need to repeat course of zpack.  eRx sent.  Hope she feels better.  Please keep [place] updated.

## 2015-08-31 NOTE — Telephone Encounter (Signed)
Pt saw dr Dayton Martes on 1/31 for sinus infection/bronchitis.  Pt has taken all of her med. She got a little better but its back to where she was when she came in   Is there something she can do or can you call her in something for this [facility]

## 2015-09-01 ENCOUNTER — Other Ambulatory Visit: Payer: Self-pay

## 2015-09-01 MED ORDER — GUAIFENESIN-CODEINE 100-10 MG/5ML PO SYRP: 5.0000 mL | ORAL_SOLUTION | Freq: Two times a day (BID) | ORAL | Status: DC | PRN

## 2015-09-01 NOTE — Telephone Encounter (Signed)
Medication phoned to pharmacy.  

## 2015-09-01 NOTE — Telephone Encounter (Signed)
Just receiving this message.  Would offer phoning in codeine cough syrup if pt agrees plz phone in.

## 2015-09-01 NOTE — Telephone Encounter (Signed)
Pt left v/m; pt was seen 08/15/15 and Dr Dayton Martes refilled zpak on 08/31/15. Pt had rough night with cough and pt request refill tussionex(last printed # 140 ml on 08/15/15). Pt could pick up rx this afternoon if possible. Dr Dayton Martes is not in office .Please advise. Pt request cb.

## 2015-09-04 NOTE — Telephone Encounter (Signed)
See phone note from 2/16.  I did call in a zpack for her myself.

## 2015-09-04 NOTE — Telephone Encounter (Signed)
Detailed message left on patient's voicemail as instructed. Okay to leave message on voicemail per patient.

## 2015-09-04 NOTE — Telephone Encounter (Signed)
Patient notified as instructed by telephone. Patient stated that she picked up the zpack, but had called about the cough medication that the other doctor prescribed for her. Patient stated that she did not pick up the cough medication that the doctor prescribed on 09/01/15 (Cheratussin) and wanted to know what Dr. Dayton Martes thinks that she should do about her cough? Patient stated that a detailed message can be left on her cell phone if she is not able to pick the call up.

## 2015-09-04 NOTE — Telephone Encounter (Signed)
Cheratussin is a good cough suppressant.  It contains codeine so it will make her sleepy.

## 2015-09-04 NOTE — Telephone Encounter (Signed)
[phone] call back # Pt states she got a phone call back on Friday 2/17.  She said another doctor called in something else for her.  She wants to be sure whatever was called in was what Dr. Dayton Martes would have recommended.  She only will take what Dr. Dayton Martes suggests she take.  Please call patient to confirm.

## 2015-09-13 ENCOUNTER — Ambulatory Visit (INDEPENDENT_AMBULATORY_CARE_PROVIDER_SITE_OTHER): Payer: BLUE CROSS/BLUE SHIELD | Admitting: Family Medicine

## 2015-09-13 ENCOUNTER — Ambulatory Visit (INDEPENDENT_AMBULATORY_CARE_PROVIDER_SITE_OTHER)
Admission: RE | Admit: 2015-09-13 | Discharge: 2015-09-13 | Disposition: A | Discharge status: Home / Self Care | Payer: BLUE CROSS/BLUE SHIELD | Source: Ambulatory Visit | Attending: Family Medicine | Admitting: Family Medicine

## 2015-09-13 ENCOUNTER — Encounter: Payer: Self-pay | Admitting: Family Medicine

## 2015-09-13 VITALS — BP 132/88 | HR 98 | Temp 98.6°F | Wt 147.2 lb

## 2015-09-13 DIAGNOSIS — J069 Acute upper respiratory infection, unspecified: Secondary | ICD-10-CM

## 2015-09-13 MED ORDER — PREDNISONE 20 MG PO TABS: ORAL_TABLET | ORAL | Status: DC

## 2015-09-13 MED ORDER — HYDROCOD POLST-CPM POLST ER 10-8 MG/5ML PO SUER: 5.0000 mL | Freq: Two times a day (BID) | ORAL | Status: DC | PRN

## 2015-09-13 NOTE — Progress Notes (Signed)
Subjective:   Patient ID: [name] Phillips, female    DOB: 01-14-1981, 35 y.o.   MRN: [id number]  [name] Phillips is a pleasant 35 y.o. year old female who presents to clinic today with Shortness of Breath and Cough  on 09/13/2015  HPI:  I saw her on 08/15/15 for URI symptoms- note reviewed. Given duration and progression of symptoms and with wheezes on exam in a smoker, treated with zpack. zpack repeated on 08/31/15- has one pill left.  Over past few days, left sided back/rib pain, worsening cough and SOB.  No fevers.  Pain is worse with coughing and deep inspiration.  No known injury.  Current Outpatient Prescriptions on File Prior to Visit  Medication Sig Dispense Refill  . ALPRAZolam (XANAX) 0.5 MG tablet TAKE ONE TABLET BY MOUTH 3 TIMES DAILY AS NEEDED FOR ANXIETY OR SLEEP (Patient taking differently: TAKE ONE TABLET BY mouth daily as  NEEDED FOR ANXIETY OR SLEEP) 90 tablet 0  . bisoprolol-hydrochlorothiazide (ZIAC) 2.5-6.25 MG tablet TAKE ONE TABLET BY MOUTH EVERY DAY 30 tablet 6  . mirtazapine (REMERON) 15 MG tablet TAKE 1 TABLET TWICE DAILY 60 tablet 8  . Multiple Vitamin (MULTIVITAMIN) tablet Take 1 tablet by mouth daily.    . phenytoin (DILANTIN) 200 MG ER capsule Take 200 mg by mouth daily.    . chlorpheniramine-HYDROcodone (TUSSIONEX PENNKINETIC ER) 10-8 MG/5ML SUER Take 5 mLs by mouth every 12 (twelve) hours as needed. (Patient not taking: Reported on 09/13/2015) 140 mL 0  . guaiFENesin-codeine (CHERATUSSIN AC) 100-10 MG/5ML syrup Take 5 mLs by mouth 2 (two) times daily as needed for cough (sedation precautions). (Patient not taking: Reported on 09/13/2015) 140 mL 0   No current facility-administered medications on file prior to visit.    Allergies  Allergen Reactions  . Chantix [Varenicline Tartrate] Anxiety    No past medical history on file.  No past surgical history on file.  Family History  Problem Relation Age of Onset  . Hyperlipidemia Mother   . Hypertension  Mother   . COPD Father   . Heart disease Father   . Hypertension Father   . Cancer Maternal Aunt     breast  . Alcohol abuse Other     Social History   Social History  . Marital Status: Married    Spouse Name: N/A  . Number of Children: 2  . Years of Education: N/A   Occupational History  . Peridontal assistant    Social History Main Topics  . Smoking status: Current Every Day Smoker  . Smokeless tobacco: Not on file  . Alcohol Use: Not on file  . Drug Use: Not on file  . Sexual Activity: Not on file   Other Topics Concern  . Not on file   Social History Narrative   The PMH, PSH, Social History, Family History, Medications, and allergies have been reviewed in [facility], and have been updated if relevant.   Review of Systems  Constitutional: Negative for fever.  Eyes: Negative.   Respiratory: Positive for cough, shortness of breath and wheezing.   Cardiovascular: Negative.   Gastrointestinal: Negative.   Endocrine: Negative.   Genitourinary: Negative.   Musculoskeletal: Negative.   Skin: Negative.   Allergic/Immunologic: Negative.   Neurological: Negative.   Hematological: Negative.   Psychiatric/Behavioral: Negative.   All other systems reviewed and are negative.      Objective:    BP 132/88 mmHg  Pulse 98  Temp(Src) 98.6 F (37 C) (Oral)  Wt 147 lb 4 oz (66.792 kg)  SpO2 99%  LMP 08/19/2015   Physical Exam  Constitutional: She is oriented to person, place, and time. She appears well-developed and well-nourished. No distress.  HENT:  Head: Normocephalic.  Eyes: Conjunctivae are normal.  Cardiovascular: Normal rate.   Pulmonary/Chest:  Harsh cough with audible wheeze without auscultation but no respiratory distress. Unable to hear good lung sounds as pain inhibiting deep inspirations  Musculoskeletal: Normal range of motion.  Neurological: She is alert and oriented to person, place, and time. No cranial nerve deficit.  Skin: Skin is warm and dry.  She is not diaphoretic.  Psychiatric: She has a normal mood and affect. Her behavior is normal. Judgment and thought content normal.  Nursing note and vitals reviewed.         Assessment & Plan:   Acute upper respiratory infection No Follow-up on file.

## 2015-09-13 NOTE — Assessment & Plan Note (Signed)
S/p 2 rounds of zpack now with worsening cough, wheezing, rib pain. ?rib fracture. eRx sent for prednisone 20 mg daily x 7 days for wheezing/chest tightness. CXR today for better evaluation as she is not able to take deep breaths for me today. The patient indicates understanding of these issues and agrees with the plan.

## 2015-09-13 NOTE — Progress Notes (Signed)
Pre visit review using our clinic review tool, if applicable. No additional management support is needed unless otherwise documented below in the visit note. 

## 2015-09-13 NOTE — Patient Instructions (Signed)
Great to see you. I will call you with you cxr results.  Take prednisone as directed.  Tussionex as needed at bedtime.

## 2015-12-12 ENCOUNTER — Other Ambulatory Visit: Payer: Self-pay | Admitting: Family Medicine

## 2015-12-29 ENCOUNTER — Other Ambulatory Visit: Payer: Self-pay | Admitting: Family Medicine

## 2016-02-01 ENCOUNTER — Ambulatory Visit (INDEPENDENT_AMBULATORY_CARE_PROVIDER_SITE_OTHER): Payer: BLUE CROSS/BLUE SHIELD | Admitting: Family Medicine

## 2016-02-01 ENCOUNTER — Encounter: Payer: Self-pay | Admitting: Family Medicine

## 2016-02-01 VITALS — BP 116/70 | HR 82 | Temp 98.5°F | Ht 64.0 in | Wt 147.2 lb

## 2016-02-01 DIAGNOSIS — Z0000 Encounter for general adult medical examination without abnormal findings: Secondary | ICD-10-CM | POA: Diagnosis not present

## 2016-02-01 DIAGNOSIS — I10 Essential (primary) hypertension: Secondary | ICD-10-CM | POA: Diagnosis not present

## 2016-02-01 DIAGNOSIS — F411 Generalized anxiety disorder: Secondary | ICD-10-CM | POA: Diagnosis not present

## 2016-02-01 DIAGNOSIS — Z01419 Encounter for gynecological examination (general) (routine) without abnormal findings: Secondary | ICD-10-CM | POA: Insufficient documentation

## 2016-02-01 LAB — CBC WITH DIFFERENTIAL/PLATELET
BASOS ABS: 0.1 10*3/uL (ref 0.0–0.1)
Basophils Relative: 0.4 % (ref 0.0–3.0)
EOS ABS: 0.1 10*3/uL (ref 0.0–0.7)
EOS PCT: 0.6 % (ref 0.0–5.0)
HCT: 43 % (ref 36.0–46.0)
Hemoglobin: 14.3 g/dL (ref 12.0–15.0)
LYMPHS ABS: 2.8 10*3/uL (ref 0.7–4.0)
LYMPHS PCT: 19.3 % (ref 12.0–46.0)
MCHC: 33.2 g/dL (ref 30.0–36.0)
MCV: 99.5 fl (ref 78.0–100.0)
MONO ABS: 0.7 10*3/uL (ref 0.1–1.0)
MONOS PCT: 4.8 % (ref 3.0–12.0)
NEUTROS PCT: 74.9 % (ref 43.0–77.0)
Neutro Abs: 10.7 10*3/uL — ABNORMAL HIGH (ref 1.4–7.7)
PLATELETS: 319 10*3/uL (ref 150.0–400.0)
RBC: 4.32 Mil/uL (ref 3.87–5.11)
RDW: 14.3 % (ref 11.5–15.5)
WBC: 14.3 10*3/uL — AB (ref 4.0–10.5)

## 2016-02-01 LAB — LIPID PANEL
CHOL/HDL RATIO: 3
Cholesterol: 197 mg/dL (ref 0–200)
HDL: 68.2 mg/dL (ref >39.00)
LDL CALC: 101 mg/dL — AB (ref 0–99)
NONHDL: 128.85
TRIGLYCERIDES: 138 mg/dL (ref 0.0–149.0)
VLDL: 27.6 mg/dL (ref 0.0–40.0)

## 2016-02-01 LAB — COMPREHENSIVE METABOLIC PANEL
ALT: 34 U/L (ref 0–35)
AST: 21 U/L (ref 0–37)
Albumin: 4.7 g/dL (ref 3.5–5.2)
Alkaline Phosphatase: 101 U/L (ref 39–117)
BILIRUBIN TOTAL: 0.7 mg/dL (ref 0.2–1.2)
BUN: 10 mg/dL (ref 6–23)
CHLORIDE: 103 meq/L (ref 96–112)
CO2: 25 meq/L (ref 19–32)
CREATININE: 0.64 mg/dL (ref 0.40–1.20)
Calcium: 10.1 mg/dL (ref 8.4–10.5)
GFR: 110.98 mL/min (ref >60.00)
GLUCOSE: 108 mg/dL — AB (ref 70–99)
Potassium: 4.4 mEq/L (ref 3.5–5.1)
Sodium: 135 mEq/L (ref 135–145)
Total Protein: 7.7 g/dL (ref 6.0–8.3)

## 2016-02-01 LAB — TSH: TSH: 1.31 u[IU]/mL (ref 0.35–4.50)

## 2016-02-01 LAB — LIPASE: LIPASE: 10 U/L — AB (ref 11.0–59.0)

## 2016-02-01 MED ORDER — BISOPROLOL-HYDROCHLOROTHIAZIDE 2.5-6.25 MG PO TABS: 1.0000 | ORAL_TABLET | Freq: Every day | ORAL | Status: DC

## 2016-02-01 MED ORDER — ALPRAZOLAM 0.5 MG PO TABS: ORAL_TABLET | ORAL | Status: DC

## 2016-02-01 MED ORDER — MIRTAZAPINE 15 MG PO TABS: 15.0000 mg | ORAL_TABLET | Freq: Two times a day (BID) | ORAL | Status: DC

## 2016-02-01 NOTE — Addendum Note (Signed)
Addended by: Alvina ChouWALSH, Izaiha Lo J on: 02/01/2016 12:40 PM   Modules accepted: Kipp BroodSmartSet

## 2016-02-01 NOTE — Assessment & Plan Note (Signed)
Well controlled. No changes made to rxs. 

## 2016-02-01 NOTE — Assessment & Plan Note (Signed)
Reviewed preventive care protocols, scheduled due services, and updated immunizations Discussed nutrition, exercise, diet, and healthy lifestyle.  Orders Placed This Encounter  Procedures  . CBC with Differential/Platelet  . Comprehensive metabolic panel  . Lipid panel  . TSH  . Lipase

## 2016-02-01 NOTE — Progress Notes (Signed)
Subjective:   Patient ID: [name] Phillips, female    DOB: Nov 15, 1980, 35 y.o.   MRN: [id number]  [name] Phillips is a pleasant 35 y.o. year old female who presents to clinic today with Annual Exam  on 02/01/2016  HPI: 35 yo pleasant female here for CPX.  Has GYN ( Dr. Haskel Khan)- per pt, UTD pap smear.  BP has been under much better control.  Likes her job- denies HA, blurred vision CP or SOB. She is taking Ziac daily.  Lab Results  Component Value Date   CREATININE 0.6 06/02/2014     Anxiety has also been under good control.   Smoking 3/4 ppd- not ready to quit. Current Outpatient Prescriptions on File Prior to Visit  Medication Sig Dispense Refill  . Multiple Vitamin (MULTIVITAMIN) tablet Take 1 tablet by mouth daily.     No current facility-administered medications on file prior to visit.    Allergies  Allergen Reactions  . Chantix [Varenicline Tartrate] Anxiety    No past medical history on file.  No past surgical history on file.  Family History  Problem Relation Age of Onset  . Hyperlipidemia Mother   . Hypertension Mother   . COPD Father   . Heart disease Father   . Hypertension Father   . Cancer Maternal Aunt     breast  . Alcohol abuse Other     Social History   Social History  . Marital Status: Married    Spouse Name: N/A  . Number of Children: 2  . Years of Education: N/A   Occupational History  . Peridontal assistant    Social History Main Topics  . Smoking status: Current Every Day Smoker  . Smokeless tobacco: Not on file  . Alcohol Use: Not on file  . Drug Use: Not on file  . Sexual Activity: Not on file   Other Topics Concern  . Not on file   Social History Narrative   The PMH, PSH, Social History, Family History, Medications, and allergies have been reviewed in [facility], and have been updated if relevant.     Review of Systems  Constitutional: Negative.   HENT: Negative.   Eyes: Negative.   Respiratory: Negative.     Cardiovascular: Negative.   Gastrointestinal: Negative.   Endocrine: Negative.   Genitourinary: Negative.   Musculoskeletal: Negative.   Allergic/Immunologic: Negative.   Neurological: Negative.   Hematological: Negative.   Psychiatric/Behavioral: Negative.   All other systems reviewed and are negative.      Objective:    BP 116/70 mmHg  Pulse 82  Temp(Src) 98.5 F (36.9 C) (Oral)  Ht  (1.626 m)  Wt 147 lb 4 oz (66.792 kg)  BMI 25.26 kg/m2  SpO2 98%  LMP 01/28/2016  Wt Readings from Last 3 Encounters:  02/01/16 147 lb 4 oz (66.792 kg)  09/13/15 147 lb 4 oz (66.792 kg)  08/15/15 151 lb 4 oz (68.607 kg)    Physical Exam  Constitutional: She is oriented to person, place, and time. She appears well-developed and well-nourished. No distress.  HENT:  Head: Normocephalic.  Eyes: Pupils are equal, round, and reactive to light.  Neck: Normal range of motion. Neck supple.  Cardiovascular: Normal rate and regular rhythm.   Pulmonary/Chest: Effort normal and breath sounds normal. No respiratory distress. She has no wheezes.  Abdominal: Soft. Bowel sounds are normal. She exhibits no distension. There is no tenderness.  Musculoskeletal: Normal range of motion.  Neurological: She is alert and  oriented to person, place, and time. No cranial nerve deficit.  Skin: Skin is warm and dry.  Nursing note and vitals reviewed.         Assessment & Plan:   Well woman exam - Plan: CBC with Differential/Platelet, Comprehensive metabolic panel, Lipid panel, TSH, Lipase  Essential hypertension  Generalized anxiety disorder No Follow-up on file.

## 2016-02-01 NOTE — Patient Instructions (Signed)
Happy birthday! We will call you with your results.

## 2016-02-02 ENCOUNTER — Encounter: Payer: Self-pay | Admitting: *Deleted

## 2016-02-05 ENCOUNTER — Telehealth: Payer: Self-pay | Admitting: Family Medicine

## 2016-02-05 NOTE — Telephone Encounter (Signed)
Pt l/m on triage phone.  Pt has an appointment scheduled for [facility] for right arm pain.  Her last appointment with their office was 06/2015 and they will not prescribe any medication prior to seeing her on 02/09/16.  [facility] suggested that she call PCP to get rx for pain so she can sleep and work this week.  Best number to call pt is [phone]

## 2016-02-05 NOTE — Telephone Encounter (Signed)
Has she tried Ibuprofen 800 mg three times daily with food?

## 2016-02-05 NOTE — Telephone Encounter (Signed)
Lm on pts vm and requested a call back 

## 2016-02-05 NOTE — Telephone Encounter (Signed)
Spoke to pt states she has tried both 800mg  of ibuprofen and goody powder with no relief. She has an upcoming appt on 7/28 with ortho and was requesting something until she can be seen. They were unable to provide medication because it has been longer than 79mos since she was last seen

## 2016-02-05 NOTE — Telephone Encounter (Signed)
I don't prescribe narcotics in situations like this.  We could certainly try a stronger NSAID like Mobic if she interested.  Let me know.

## 2016-02-06 NOTE — Telephone Encounter (Signed)
Spoke to pt who states she has had some relief and should be ok until her upcoming appt 7/28

## 2016-05-31 ENCOUNTER — Telehealth: Payer: Self-pay | Admitting: *Deleted

## 2016-05-31 NOTE — Telephone Encounter (Signed)
This message was received via MyChart. I have informed the PT that she is able to directly message Dr. Dayton MartesAron via MyChart but that I would also forward this message to her.    I am hoping this message can be forwarded to Dr. Dayton MartesAron :)     Dr. Dayton MartesAron, would it be possible for you to email me at [email or web address]? I have a couple of things that I would like to discuss with you that I do not think would require taking up our time for an office visit. I will also call the office tomorrow to see if I can obtain an email address to email you if that is possible and convenient for you as well just in case this message gets lost in cyber space.     Hope to talk with you soon. :)

## 2016-07-16 ENCOUNTER — Other Ambulatory Visit: Payer: Self-pay | Admitting: Family Medicine

## 2016-07-17 NOTE — Telephone Encounter (Signed)
Rx called in to requested pharmacy 

## 2016-07-17 NOTE — Telephone Encounter (Signed)
Last f/u 01/2016 

## 2016-10-18 ENCOUNTER — Other Ambulatory Visit: Payer: Self-pay | Admitting: Family Medicine

## 2016-10-18 NOTE — Telephone Encounter (Signed)
Left refill on voice mail at pharmacy  

## 2016-10-18 NOTE — Telephone Encounter (Signed)
Last filled 07-17-16 #90 Last OV CPE 02-01-16

## 2016-12-12 ENCOUNTER — Other Ambulatory Visit: Payer: Self-pay | Admitting: Family Medicine

## 2017-01-16 ENCOUNTER — Other Ambulatory Visit: Payer: Self-pay | Admitting: Family Medicine

## 2017-01-17 NOTE — Telephone Encounter (Signed)
Left refill on voice mail at pharmacy  

## 2017-01-17 NOTE — Telephone Encounter (Signed)
Last filled 07-17-16 #90 Last OV 02-01-16 Next OV 02-06-17

## 2017-02-06 ENCOUNTER — Ambulatory Visit (INDEPENDENT_AMBULATORY_CARE_PROVIDER_SITE_OTHER): Payer: BLUE CROSS/BLUE SHIELD | Admitting: Family Medicine

## 2017-02-06 ENCOUNTER — Encounter: Payer: Self-pay | Admitting: Family Medicine

## 2017-02-06 VITALS — BP 138/82 | HR 102 | Ht 64.25 in | Wt 146.0 lb

## 2017-02-06 DIAGNOSIS — F411 Generalized anxiety disorder: Secondary | ICD-10-CM | POA: Diagnosis not present

## 2017-02-06 DIAGNOSIS — I10 Essential (primary) hypertension: Secondary | ICD-10-CM | POA: Diagnosis not present

## 2017-02-06 DIAGNOSIS — Z01419 Encounter for gynecological examination (general) (routine) without abnormal findings: Secondary | ICD-10-CM

## 2017-02-06 DIAGNOSIS — F17200 Nicotine dependence, unspecified, uncomplicated: Secondary | ICD-10-CM

## 2017-02-06 LAB — COMPREHENSIVE METABOLIC PANEL
ALBUMIN: 5.1 g/dL (ref 3.5–5.2)
ALK PHOS: 123 U/L — AB (ref 39–117)
ALT: 32 U/L (ref 0–35)
AST: 17 U/L (ref 0–37)
BUN: 12 mg/dL (ref 6–23)
CALCIUM: 10.1 mg/dL (ref 8.4–10.5)
CO2: 26 mEq/L (ref 19–32)
CREATININE: 0.75 mg/dL (ref 0.40–1.20)
Chloride: 103 mEq/L (ref 96–112)
GFR: 91.91 mL/min (ref >60.00)
Glucose, Bld: 123 mg/dL — ABNORMAL HIGH (ref 70–99)
POTASSIUM: 3.8 meq/L (ref 3.5–5.1)
SODIUM: 138 meq/L (ref 135–145)
TOTAL PROTEIN: 8 g/dL (ref 6.0–8.3)
Total Bilirubin: 0.9 mg/dL (ref 0.2–1.2)

## 2017-02-06 LAB — LIPID PANEL
CHOLESTEROL: 222 mg/dL — AB (ref 0–200)
HDL: 86.6 mg/dL (ref >39.00)
LDL Cholesterol: 101 mg/dL — ABNORMAL HIGH (ref 0–99)
NonHDL: 135.04
TRIGLYCERIDES: 171 mg/dL — AB (ref 0.0–149.0)
Total CHOL/HDL Ratio: 3
VLDL: 34.2 mg/dL (ref 0.0–40.0)

## 2017-02-06 LAB — TSH: TSH: 0.75 u[IU]/mL (ref 0.35–4.50)

## 2017-02-06 LAB — CBC WITH DIFFERENTIAL/PLATELET
BASOS PCT: 0.5 % (ref 0.0–3.0)
Basophils Absolute: 0.1 10*3/uL (ref 0.0–0.1)
EOS ABS: 0.1 10*3/uL (ref 0.0–0.7)
EOS PCT: 0.9 % (ref 0.0–5.0)
HCT: 44.1 % (ref 36.0–46.0)
Hemoglobin: 15 g/dL (ref 12.0–15.0)
Lymphocytes Relative: 15.3 % (ref 12.0–46.0)
Lymphs Abs: 1.8 10*3/uL (ref 0.7–4.0)
MCHC: 34.1 g/dL (ref 30.0–36.0)
MCV: 103.2 fl — ABNORMAL HIGH (ref 78.0–100.0)
MONO ABS: 0.8 10*3/uL (ref 0.1–1.0)
Monocytes Relative: 6.9 % (ref 3.0–12.0)
NEUTROS ABS: 9 10*3/uL — AB (ref 1.4–7.7)
Neutrophils Relative %: 76.4 % (ref 43.0–77.0)
PLATELETS: 344 10*3/uL (ref 150.0–400.0)
RBC: 4.27 Mil/uL (ref 3.87–5.11)
RDW: 13.6 % (ref 11.5–15.5)
WBC: 11.8 10*3/uL — ABNORMAL HIGH (ref 4.0–10.5)

## 2017-02-06 MED ORDER — BUPROPION HCL ER (XL) 150 MG PO TB24: 150.0000 mg | ORAL_TABLET | Freq: Every day | ORAL | 3 refills | Status: DC

## 2017-02-06 MED ORDER — BISOPROLOL-HYDROCHLOROTHIAZIDE 2.5-6.25 MG PO TABS: 1.0000 | ORAL_TABLET | Freq: Every day | ORAL | 3 refills | Status: DC

## 2017-02-06 MED ORDER — MIRTAZAPINE 15 MG PO TABS: 15.0000 mg | ORAL_TABLET | Freq: Two times a day (BID) | ORAL | 2 refills | Status: DC

## 2017-02-06 NOTE — Patient Instructions (Signed)
Good to see you. Congratulations on your new job!  We are starting wellbutrin 150 mg XL daily.  Please keep me updated.  We will call you with your results from today or you can view them online.

## 2017-02-06 NOTE — Assessment & Plan Note (Addendum)
Deteriorated, now with symptoms of depression. PHQ 2- add wellbutrin 150 mg XL daily.

## 2017-02-06 NOTE — Assessment & Plan Note (Signed)
Well controlled. No changes made today. 

## 2017-02-06 NOTE — Assessment & Plan Note (Signed)
Reviewed preventive care protocols, scheduled due services, and updated immunizations Discussed nutrition, exercise, diet, and healthy lifestyle.  Orders Placed This Encounter  Procedures  . CBC with Differential/Platelet  . Comprehensive metabolic panel  . Lipid panel  . TSH     

## 2017-02-06 NOTE — Progress Notes (Signed)
Subjective:   Patient ID: [name] Phillips, female    DOB: 1980/11/14, 36 y.o.   MRN: [id number]  [name] Phillips is a pleasant 36 y.o. year old female who presents to clinic today with Annual Exam  on 02/06/2017  HPI:   Has GYN ( [name] Phillips)- per pt, UTD pap smear- 8/2017r.  BP has been under much better control.  Still happy at work- denies HA, blurred vision CP or SOB. She is taking Ziac daily.  Lab Results  Component Value Date   CREATININE 0.64 02/01/2016   Due for labs.  Anxiety has deteriorated.  Also having more symptoms of "not wanting get out of bed in the morning," she feels maybe she is depressed.. Denies SI or HI.  Eating okay.  Likes her new job and marriage is going well. She is taking Remeron 15 mg twice daily.   Smoking 3/4 ppd- not ready to quit. Current Outpatient Prescriptions on File Prior to Visit  Medication Sig Dispense Refill  . ALPRAZolam (XANAX) 0.5 MG tablet TAKE ONE TABLET BY MOUTH EVERY DAY AS NEEDED FOR ANXIETY 90 tablet 0  . bisoprolol-hydrochlorothiazide (ZIAC) 2.5-6.25 MG tablet Take 1 tablet by mouth daily. 90 tablet 2  . mirtazapine (REMERON) 15 MG tablet Take 1 tablet (15 mg total) by mouth 2 (two) times daily. 180 tablet 2  . Multiple Vitamin (MULTIVITAMIN) tablet Take 1 tablet by mouth daily.     No current facility-administered medications on file prior to visit.     Allergies  Allergen Reactions  . Chantix [Varenicline Tartrate] Anxiety    No past medical history on file.  No past surgical history on file.  Family History  Problem Relation Age of Onset  . Hyperlipidemia Mother   . Hypertension Mother   . COPD Father   . Heart disease Father   . Hypertension Father   . Cancer Maternal Aunt        breast  . Alcohol abuse Other     Social History   Social History  . Marital status: Married    Spouse name: N/A  . Number of children: 2  . Years of education: N/A   Occupational History  . 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 Smoking status: Current Every Day Smoker    Packs/day: 1.00    Types: Cigarettes  . Smokeless tobacco: Never Used  . Alcohol use Not on file     Comment: social   . Drug use: Unknown  . Sexual activity: Not on file   Other Topics Concern  . Not on file   Social History Narrative  . No narrative on file   The PMH, PSH, Social History, Family History, Medications, and allergies have been reviewed in [facility], and have been updated if relevant.     Review of Systems  Constitutional: Negative.   HENT: Negative.   Eyes: Negative.   Respiratory: Negative.   Cardiovascular: Negative.   Gastrointestinal: Negative.   Endocrine: Negative.   Genitourinary: Negative.   Musculoskeletal: Negative.   Allergic/Immunologic: Negative.   Neurological: Negative.   Hematological: Negative.   Psychiatric/Behavioral: Positive for decreased concentration, dysphoric mood and sleep disturbance. Negative for agitation, behavioral problems, hallucinations, self-injury and suicidal ideas. The patient is nervous/anxious. The patient is not hyperactive.   All other systems reviewed and are negative.      Objective:    BP 138/82   Pulse (!) 102   Ht 5' 4.25" (  1.632 m)   Wt 146 lb (66.2 kg)   LMP 01/31/2017   SpO2 99%   BMI 24.87 kg/m   Wt Readings from Last 3 Encounters:  02/06/17 146 lb (66.2 kg)  02/01/16 147 lb 4 oz (66.8 kg)  09/13/15 147 lb 4 oz (66.8 kg)    Physical Exam  Constitutional: She is oriented to person, place, and time. She appears well-developed and well-nourished. No distress.  HENT:  Head: Normocephalic and atraumatic.  Right Ear: External ear normal.  Left Ear: External ear normal.  Nose: Nose normal.  Mouth/Throat: No oropharyngeal exudate.  Eyes: Pupils are equal, round, and reactive to light. EOM are normal.  Neck: Normal range of motion. Neck supple.  Cardiovascular: Normal rate, regular rhythm and normal heart  sounds.   Pulmonary/Chest: Effort normal and breath sounds normal. No respiratory distress. She has no wheezes.  Abdominal: Soft. Bowel sounds are normal. She exhibits no distension. There is no tenderness.  Musculoskeletal: Normal range of motion. She exhibits no edema or deformity.  Neurological: She is alert and oriented to person, place, and time. No cranial nerve deficit.  Skin: Skin is warm and dry.  Psychiatric: She has a normal mood and affect. Her behavior is normal. Judgment and thought content normal.  Nursing note and vitals reviewed.         Assessment & Plan:   Well woman exam - Plan: CBC with Differential/Platelet, Comprehensive metabolic panel, Lipid panel, TSH  Essential hypertension  Generalized anxiety disorder  [employer] No Follow-up on file.

## 2017-04-28 ENCOUNTER — Other Ambulatory Visit: Payer: Self-pay | Admitting: Family Medicine

## 2017-04-29 NOTE — Telephone Encounter (Signed)
Requesting: Alprazolam Contract: None UDS: None Last OV: 7.26.2018 Next OV: Not scheduled Last Refill: 7.06.2018   Please advise

## 2017-04-29 NOTE — Telephone Encounter (Signed)
Faxed to pharm/thx dmf 

## 2017-05-27 ENCOUNTER — Other Ambulatory Visit: Payer: Self-pay | Admitting: Family Medicine

## 2017-05-27 MED ORDER — MIRTAZAPINE 15 MG PO TABS: 15.0000 mg | ORAL_TABLET | Freq: Two times a day (BID) | ORAL | 0 refills | Status: DC

## 2017-05-27 MED ORDER — ALPRAZOLAM 0.5 MG PO TABS: ORAL_TABLET | ORAL | 0 refills | Status: DC

## 2017-05-27 MED ORDER — BISOPROLOL-HYDROCHLOROTHIAZIDE 2.5-6.25 MG PO TABS: 1.0000 | ORAL_TABLET | Freq: Every day | ORAL | 0 refills | Status: DC

## 2017-05-27 NOTE — Telephone Encounter (Signed)
TA-Plz see pt is requesting Alprazolam, Mirtazapine, and Bisoprolol-HCTZ/I have orders as pending but will phone in the Alprazolam if approved/she states that she is still undecided about transferring over to [place]/Plz advise/thx dmf

## 2017-05-27 NOTE — Telephone Encounter (Signed)
Copied from CRM [phone]#[id number]. Topic: Quick Communication - See Telephone Encounter >> May 27, 2017  1:57 PM Diana EvesHoyt, [name] Phillips wrote: CRM for notification. See Telephone encounter for:  Pt needing 3 meds refilled. Mirtazapine, alprazolam and BP med. Pt is still deciding on if its economical for her to move to [place].  05/27/17.

## 2017-05-28 NOTE — Telephone Encounter (Signed)
Called in Alprazolam to pharm/thx dmf 

## 2017-07-15 HISTORY — PX: CERVICAL SPINE SURGERY: SHX589

## 2017-07-28 ENCOUNTER — Encounter: Payer: Self-pay | Admitting: Family Medicine

## 2017-07-28 ENCOUNTER — Ambulatory Visit: Payer: 59 | Admitting: Family Medicine

## 2017-07-28 VITALS — BP 136/82 | HR 101 | Temp 98.8°F | Ht 64.25 in | Wt 154.4 lb

## 2017-07-28 DIAGNOSIS — N943 Premenstrual tension syndrome: Secondary | ICD-10-CM | POA: Diagnosis not present

## 2017-07-28 DIAGNOSIS — J069 Acute upper respiratory infection, unspecified: Secondary | ICD-10-CM | POA: Diagnosis not present

## 2017-07-28 DIAGNOSIS — F411 Generalized anxiety disorder: Secondary | ICD-10-CM | POA: Diagnosis not present

## 2017-07-28 DIAGNOSIS — I10 Essential (primary) hypertension: Secondary | ICD-10-CM

## 2017-07-28 MED ORDER — NORETHINDRONE 0.35 MG PO TABS: 1.0000 | ORAL_TABLET | Freq: Every day | ORAL | 11 refills | Status: DC

## 2017-07-28 MED ORDER — MIRTAZAPINE 15 MG PO TABS: 15.0000 mg | ORAL_TABLET | Freq: Two times a day (BID) | ORAL | 1 refills | Status: DC

## 2017-07-28 MED ORDER — HYDROCOD POLST-CPM POLST ER 10-8 MG/5ML PO SUER: 5.0000 mL | Freq: Two times a day (BID) | ORAL | 0 refills | Status: DC | PRN

## 2017-07-28 MED ORDER — ALPRAZOLAM 0.5 MG PO TABS: ORAL_TABLET | ORAL | 1 refills | Status: DC

## 2017-07-28 MED ORDER — BISOPROLOL-HYDROCHLOROTHIAZIDE 2.5-6.25 MG PO TABS: 1.0000 | ORAL_TABLET | Freq: Every day | ORAL | 1 refills | Status: DC

## 2017-07-28 MED ORDER — AZITHROMYCIN 250 MG PO TABS
ORAL_TABLET | ORAL | 0 refills | Status: DC
Start: 2017-07-28 — End: 2018-05-01

## 2017-07-28 NOTE — Assessment & Plan Note (Signed)
Given duration and progression of symptoms, will treat for bacterial sinusitis/bronchitis.

## 2017-07-28 NOTE — Assessment & Plan Note (Signed)
Well controlled. eRx refills sent. 

## 2017-07-28 NOTE — Assessment & Plan Note (Signed)
Well controlled. Rxs refilled.

## 2017-07-28 NOTE — Progress Notes (Signed)
Subjective:   Patient ID: [name] Phillips, female    DOB: June 30, 1981, 37 y.o.   MRN: [id number]  MARISE KNAPPER is a pleasant 37 y.o. year old female who presents to clinic today with Cough (Patient is here today C/O a cough since 12.31.2018 but on Thursday it worsened.  States that she has coughed so much that her ribs hurt.  Cough is now productive with greenish-yellowish sputum.  Also has maxillary and periorbital sinus pressure.  Says is unable to sleep due to her Sx.  Has Tx with multi Sx Mucinex, then Mucinex, Delsum, Ibuprofen, and Alka Seltzer.  She has had intermittent fevers up to 100.7 but takes Ibuprofen for it.) and Medication Refill (She is needing refills on Alprazolam, Bisoprolol-HCT, and Remeron.  She is no longer taking the Bupropion 2 weeks after started and says that she is unsure of why she stopped and does not feel that she needs it as of now.)  on 07/28/2017  HPI:  Cough- progressive x 2 weeks.  A few days ago, became more productive of greenish- yellow mucous. Now also has maxillary and periorbital sinus pressure. Has tried mucinex, Delsym, Alka seltzer and Ibuprofen without much relief.  Anxiety- feels currently well controlled with Remeron and as needed xanax.  She does feel she is having more "PMS" mood swings.  Feels fine except for those few days before her period.  BP has also been well controlled with Bisoprolol- HCTZ.  Current Outpatient Medications on File Prior to Visit  Medication Sig Dispense Refill  . Multiple Vitamin (MULTIVITAMIN) tablet Take 1 tablet by mouth daily.     No current facility-administered medications on file prior to visit.     Allergies  Allergen Reactions  . Chantix [Varenicline Tartrate] Anxiety    No past medical history on file.  No past surgical history on file.  Family History  Problem Relation Age of Onset  . Hyperlipidemia Mother   . Hypertension Mother   . COPD Father   . Heart disease Father   . Hypertension  Father   . Cancer Maternal Aunt        breast  . Alcohol abuse Other     Social History   Socioeconomic History  . Marital status: Married    Spouse name: Not on file  . Number of children: 2  . Years of education: Not on file  . Highest education level: Not on file  Social Needs  . Financial resource strain: Not on file  . Food insecurity - worry: Not on file  . Food insecurity - inability: Not on file  . Transportation needs - medical: Not on file  . Transportation needs - non-medical: Not on file  Occupational History  . Occupation: Geophysicist/field seismologist: [employer]  Tobacco Use  . Smoking status: Current Every Day Smoker    Packs/day: 1.00    Types: Cigarettes  . Smokeless tobacco: Never Used  Substance and Sexual Activity  . Alcohol use: Not on file    Comment: social   . Drug use: Not on file  . Sexual activity: Not on file  Other Topics Concern  . Not on file  Social History Narrative  . Not on file   The PMH, PSH, Social History, Family History, Medications, and allergies have been reviewed in [facility], and have been updated if relevant.   Review of Systems  Constitutional: Negative.   HENT: Positive for congestion, sinus pressure, sinus pain, sneezing  and sore throat. Negative for dental problem, drooling, ear discharge, ear pain, facial swelling, hearing loss, mouth sores, nosebleeds, postnasal drip, rhinorrhea, tinnitus, trouble swallowing and voice change.   Eyes: Negative.   Respiratory: Positive for cough. Negative for shortness of breath, wheezing and stridor.   Cardiovascular: Positive for chest pain. Negative for palpitations and leg swelling.  Gastrointestinal: Negative.   Endocrine: Negative.   Genitourinary: Negative.   Musculoskeletal: Negative.   Allergic/Immunologic: Negative.   Neurological: Negative.   Hematological: Negative.   Psychiatric/Behavioral: Positive for dysphoric mood. Negative for self-injury, sleep disturbance and  suicidal ideas. The patient is not nervous/anxious.   All other systems reviewed and are negative.      Objective:    BP 136/82 (BP Location: Left Arm, Patient Position: Sitting, Cuff Size: Normal)   Pulse (!) 101   Temp 98.8 F (37.1 C) (Oral)   Ht 5' 4.25" (1.632 m)   Wt 154 lb 6.4 oz (70 kg)   LMP 07/13/2017   SpO2 94%   BMI 26.30 kg/m    Physical Exam  Constitutional: She is oriented to person, place, and time. She appears well-developed and well-nourished. No distress.  HENT:  Head: Normocephalic and atraumatic.  Right Ear: Hearing and tympanic membrane normal.  Left Ear: Hearing and tympanic membrane normal.  Nose: Mucosal edema and rhinorrhea present. Right sinus exhibits maxillary sinus tenderness. Right sinus exhibits no frontal sinus tenderness. Left sinus exhibits frontal sinus tenderness. Left sinus exhibits no maxillary sinus tenderness.  Eyes: Conjunctivae are normal.  Cardiovascular: Normal rate and regular rhythm.  Pulmonary/Chest: Effort normal. She has wheezes.  Musculoskeletal: Normal range of motion.  Neurological: She is alert and oriented to person, place, and time. No cranial nerve deficit.  Skin: Skin is warm and dry. She is not diaphoretic.  Psychiatric: She has a normal mood and affect. Her behavior is normal. Judgment and thought content normal.  Nursing note and vitals reviewed.         Assessment & Plan:   Upper respiratory tract infection, unspecified type  Essential hypertension  Generalized anxiety disorder  Premenstrual syndrome No Follow-up on file.

## 2017-07-28 NOTE — Assessment & Plan Note (Signed)
Discussed tx options. She would like to try OCPs vs adjusting antidepressant. Husband has had a vasectomy.  Does not need OCPs for contraception. She is a smoker above the age of 37, so will send in rx for progesterone only preparation. Call or return to clinic prn if these symptoms worsen or fail to improve as anticipated. The patient indicates understanding of these issues and agrees with the plan.

## 2017-07-29 ENCOUNTER — Encounter: Payer: Self-pay | Admitting: Family Medicine

## 2017-08-26 ENCOUNTER — Encounter: Payer: Self-pay | Admitting: Family Medicine

## 2017-11-18 ENCOUNTER — Encounter: Payer: Self-pay | Admitting: Family Medicine

## 2017-12-03 ENCOUNTER — Encounter: Payer: Self-pay | Admitting: Family Medicine

## 2017-12-05 IMAGING — DX DG CHEST 2V
2 series · 2 of 2 positions shown · non-contrast
Comparison: Chest x-ray of July 20, 2012

CLINICAL DATA: One month of cough, shortness of breath, URI
symptoms, current smoker.

EXAM:
CHEST  2 VIEW

[chest pa]
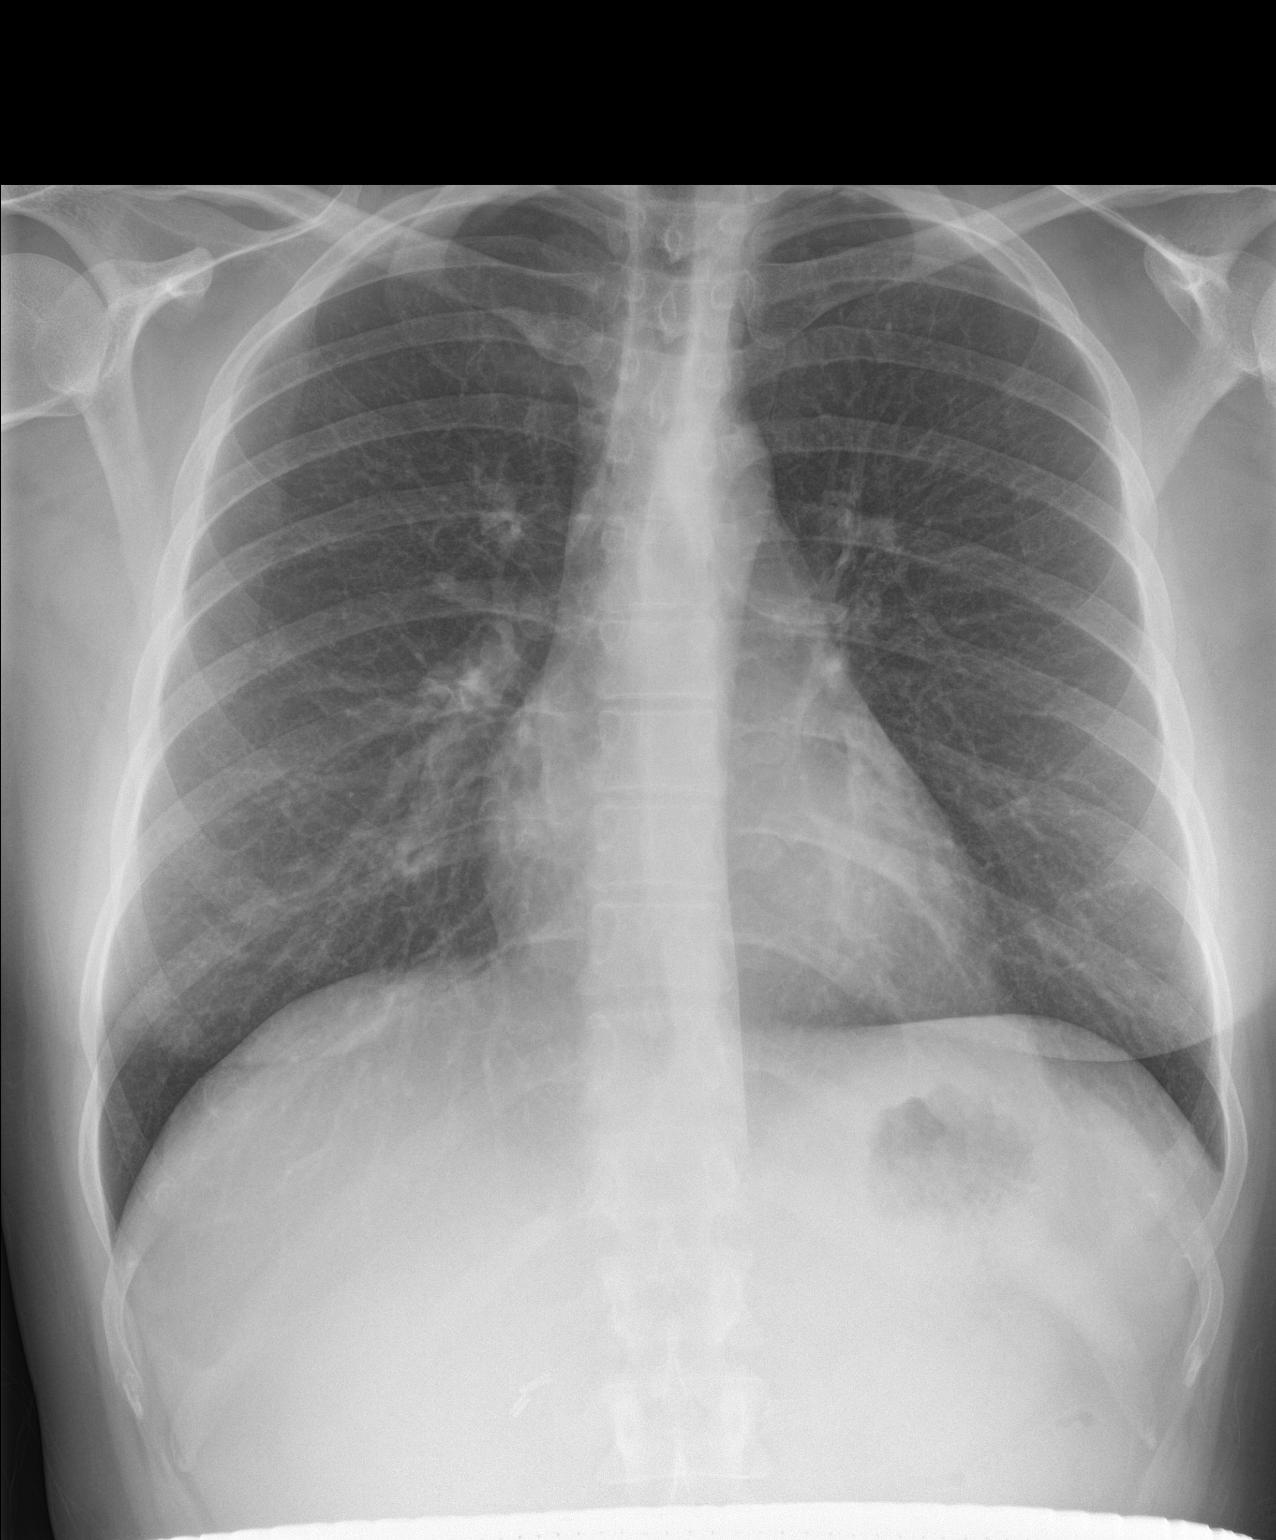

[chest lat]
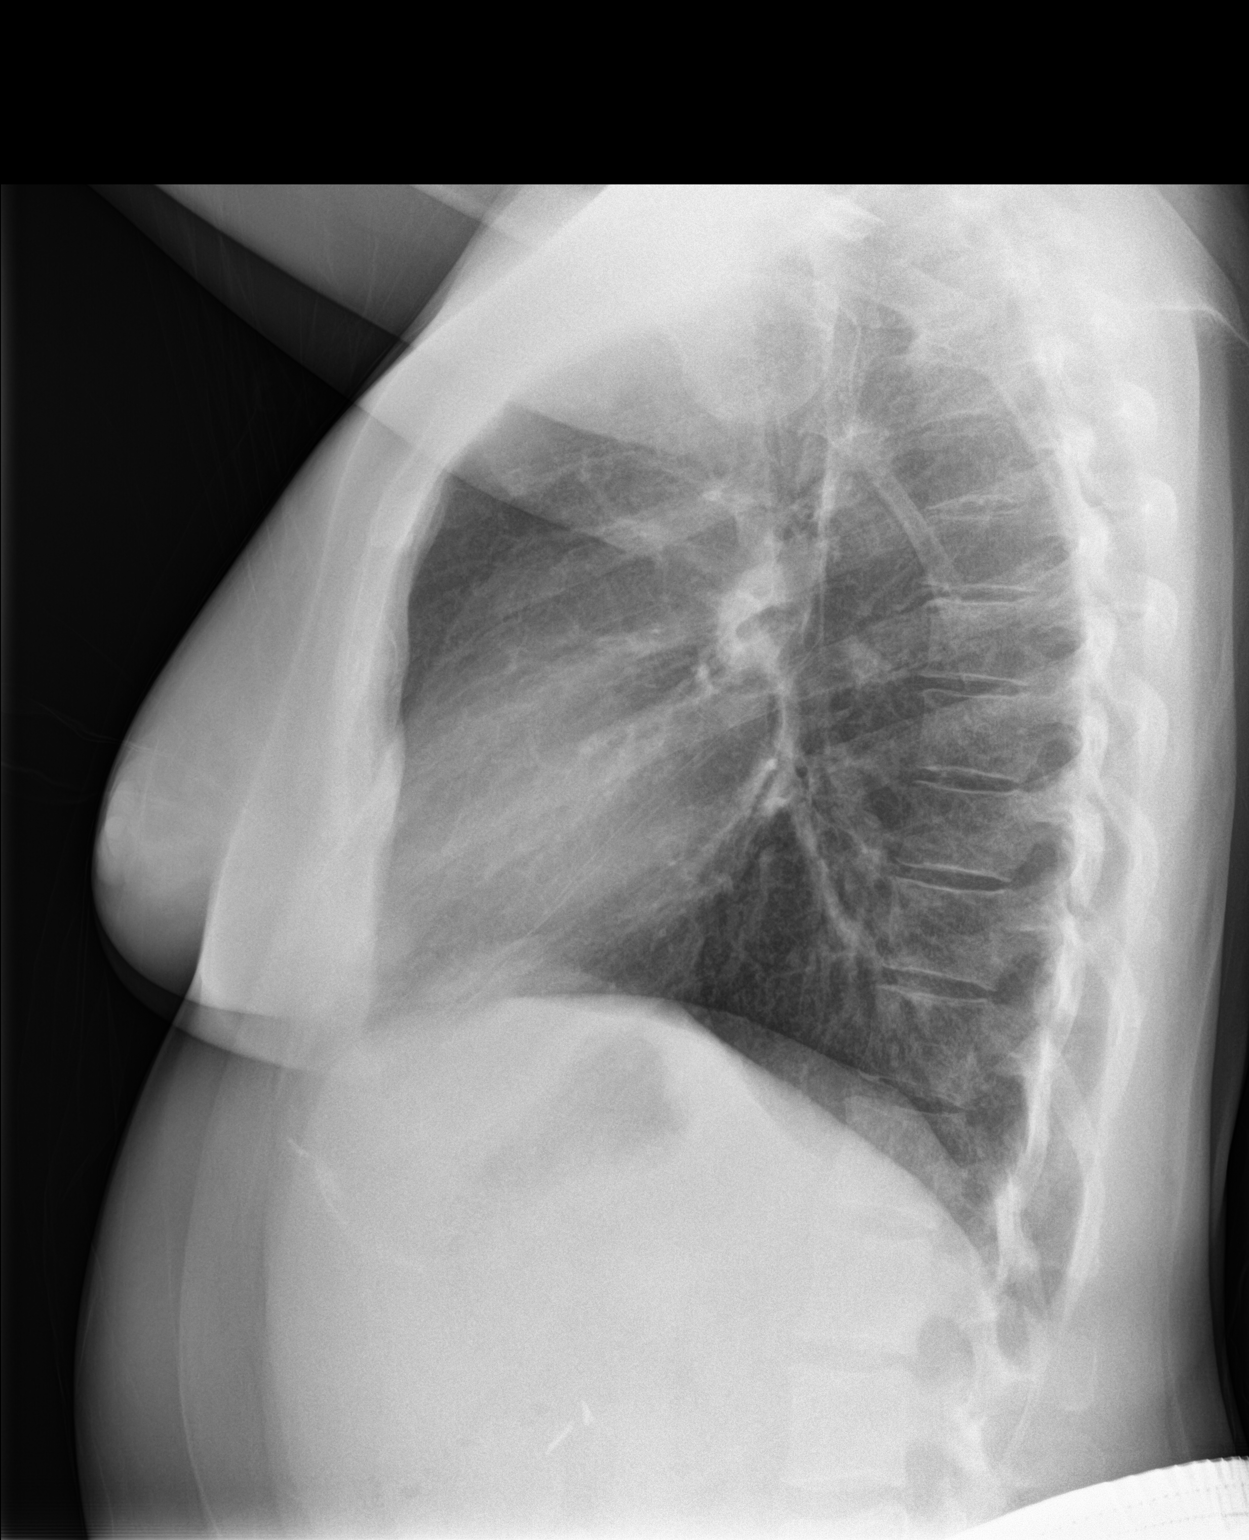

[2 of 2 positions shown; findings below may reference images not displayed]

FINDINGS: The lungs are adequately inflated. There is no focal infiltrate.
There is no pleural effusion. The interstitial markings are
minimally prominent though stable. The heart and pulmonary
vascularity are normal. The mediastinum is normal in width. The bony
thorax is unremarkable.
IMPRESSION: There is no evidence of pneumonia. Mild interstitial prominence
likely reflects the patient's smoking history.

## 2017-12-15 DIAGNOSIS — R102 Pelvic and perineal pain: Secondary | ICD-10-CM | POA: Diagnosis not present

## 2017-12-15 DIAGNOSIS — N83209 Unspecified ovarian cyst, unspecified side: Secondary | ICD-10-CM | POA: Diagnosis not present

## 2017-12-16 ENCOUNTER — Encounter: Payer: Self-pay | Admitting: Obstetrics & Gynecology

## 2017-12-16 ENCOUNTER — Ambulatory Visit (INDEPENDENT_AMBULATORY_CARE_PROVIDER_SITE_OTHER): Payer: 59 | Admitting: Obstetrics & Gynecology

## 2017-12-16 VITALS — BP 140/98 | Ht 64.0 in | Wt 154.0 lb

## 2017-12-16 DIAGNOSIS — Z8742 Personal history of other diseases of the female genital tract: Secondary | ICD-10-CM | POA: Diagnosis not present

## 2017-12-16 DIAGNOSIS — R1032 Left lower quadrant pain: Secondary | ICD-10-CM | POA: Diagnosis not present

## 2017-12-16 NOTE — Progress Notes (Signed)
Gynecology Pelvic Pain Evaluation   Chief Complaint  Patient presents with  . History of Ovarian Cyst and recent LLQ Pain   History of Present Illness:   Patient is a 37 y.o. [id number] who LMP was Patient's last menstrual period was 09/12/2017., presents today for a problem visit.  She complains of pain.   Her pain is localized to the LLQ area, described as constant and aching, began several days ago and its severity is described as moderate. The pain radiates to the  left hip. She has these associated symptoms which include nausea. Patient has these modifiers which include relaxation that make it better and unable to associate with any factor that make it worse.  Context includes: spontaneous.  Past history includes ovarian cysts (treated by hormones, never surgery).   Currently on progesterone only pill (for period control, is a smoker so not candidate for combination OCP therapy; has not had period on these pills and feels PMS is better.)  Prior to therapy she was having menorrhagia, dysmenorrhea, and PMS.  Previous evaluation: none. Prior Diagnosis: past history ovarian cyst. Previous Treatment: OCPs.  PMHx: She  has a past medical history of Hypertension. Also,  has no past surgical history on file., family history includes Alcohol abuse in her other; COPD in her father; Cancer in her maternal aunt; Heart disease in her father; Hyperlipidemia in her mother; Hypertension in her father and mother.,  reports that she has been smoking cigarettes.  She has been smoking about 1.00 pack per day. She has never used smokeless tobacco. She reports that she drinks alcohol. She reports that she does not use drugs.  She has a current medication list which includes the following prescription(s): alprazolam, bisoprolol-hydrochlorothiazide, ciprofloxacin, mirtazapine, multivitamin, norethindrone, azithromycin, and chlorpheniramine-hydrocodone. Also, is allergic to chantix [varenicline tartrate].  Review of  Systems  Constitutional: Negative for chills, fever and malaise/fatigue.  HENT: Negative for congestion, sinus pain and sore throat.   Eyes: Negative for blurred vision and pain.  Respiratory: Negative for cough and wheezing.   Cardiovascular: Negative for chest pain and leg swelling.  Gastrointestinal: Negative for abdominal pain, constipation, diarrhea, heartburn, nausea and vomiting.  Genitourinary: Negative for dysuria, frequency, hematuria and urgency.  Musculoskeletal: Negative for back pain, joint pain, myalgias and neck pain.  Skin: Negative for itching and rash.  Neurological: Negative for dizziness, tremors and weakness.  Endo/Heme/Allergies: Does not bruise/bleed easily.  Psychiatric/Behavioral: Negative for depression. The patient is not nervous/anxious and does not have insomnia.     Objective: BP (!) 140/98   Ht 5\' 4"  (1.626 m)   Wt 154 lb (69.9 kg)   LMP 09/12/2017   BMI 26.43 kg/m  Physical Exam  Constitutional: She is oriented to person, place, and time. She appears well-developed and well-nourished. No distress.  Genitourinary: Vagina normal and uterus normal. Pelvic exam was performed with patient supine. There is no rash, tenderness or lesion on the right labia. There is no rash, tenderness or lesion on the left labia. No erythema or bleeding in the vagina. Right adnexum does not display mass and does not display tenderness. Left adnexum does not display mass and does not display tenderness. Cervix does not exhibit motion tenderness, discharge, polyp or nabothian cyst.   Uterus is mobile and midaxial. Uterus is not enlarged or exhibiting a mass.  Genitourinary Comments: Mild LLQ T, no mass  HENT:  Head: Normocephalic and atraumatic.  Nose: Nose normal.  Mouth/Throat: Oropharynx is clear and moist.  Abdominal: Soft. She  exhibits no distension. There is no tenderness.  Musculoskeletal: Normal range of motion.  Neurological: She is alert and oriented to person,  place, and time. No cranial nerve deficit.  Skin: Skin is warm and dry.  Psychiatric: She has a normal mood and affect.   Female chaperone present for pelvic portion of the physical exam  Assessment: 37 y.o. [id number] with LLQ pain and possible ovarian cyst.  1. LLQ pain - US PELVIS TRANSVANGINAL NON-OB (TV ONLY); Future - NuSwab Vaginitis Plus (VG+)  2. History of ovarian cyst - US PELVIS TRANSVANGINAL NON-OB (TV ONLY); Future  F/u after US Menorrhagia tx options also discussed, in lieu of progesterone therapy, such as ablation NSAID therapy for current pain discussed  Annamarie MajorPaul Riyaan Heroux, MD, Merlinda FrederickFACOG [facility], [facility] 12/16/2017  2:19 PM

## 2017-12-16 NOTE — Patient Instructions (Signed)
Ovarian Cyst An ovarian cyst is a fluid-filled sac that forms on an ovary. The ovaries are small organs that produce eggs in women. Various types of cysts can form on the ovaries. Some may cause symptoms and require treatment. Most ovarian cysts go away on their own, are not cancerous (are benign), and do not cause problems. Common types of ovarian cysts include:  Functional (follicle) cysts. ? Occur during the menstrual cycle, and usually go away with the next menstrual cycle if you do not get pregnant. ? Usually cause no symptoms.  Endometriomas. ? Are cysts that form from the tissue that lines the uterus (endometrium). ? Are sometimes called "chocolate cysts" because they become filled with blood that turns brown. ? Can cause pain in the lower abdomen during intercourse and during your period.  Cystadenoma cysts. ? Develop from cells on the outside surface of the ovary. ? Can get very large and cause lower abdomen pain and pain with intercourse. ? Can cause severe pain if they twist or break open (rupture).  Dermoid cysts. ? Are sometimes found in both ovaries. ? May contain different kinds of body tissue, such as skin, teeth, hair, or cartilage. ? Usually do not cause symptoms unless they get very big.  Theca lutein cysts. ? Occur when too much of a certain hormone (human chorionic gonadotropin) is produced and overstimulates the ovaries to produce an egg. ? Are most common after having procedures used to assist with the conception of a baby (in vitro fertilization).  What are the causes? Ovarian cysts may be caused by:  Ovarian hyperstimulation syndrome. This is a condition that can develop from taking fertility medicines. It causes multiple large ovarian cysts to form.  Polycystic ovarian syndrome (PCOS). This is a common hormonal disorder that can cause ovarian cysts, as well as problems with your period or fertility.  What increases the risk? The following factors may make  you more likely to develop ovarian cysts:  Being overweight or obese.  Taking fertility medicines.  Taking certain forms of hormonal birth control.  Smoking.  What are the signs or symptoms? Many ovarian cysts do not cause symptoms. If symptoms are present, they may include:  Pelvic pain or pressure.  Pain in the lower abdomen.  Pain during sex.  Abdominal swelling.  Abnormal menstrual periods.  Increasing pain with menstrual periods.  How is this diagnosed? These cysts are commonly found during a routine pelvic exam. You may have tests to find out more about the cyst, such as:  Ultrasound.  X-ray of the pelvis.  CT scan.  MRI.  Blood tests.  How is this treated? Many ovarian cysts go away on their own without treatment. Your health care provider may want to check your cyst regularly for 2-3 months to see if it changes. If you are in menopause, it is especially important to have your cyst monitored closely because menopausal women have a higher rate of ovarian cancer. When treatment is needed, it may include:  Medicines to help relieve pain.  A procedure to drain the cyst (aspiration).  Surgery to remove the whole cyst.  Hormone treatment or birth control pills. These methods are sometimes used to help dissolve a cyst.  Follow these instructions at home:  Take over-the-counter and prescription medicines only as told by your health care provider.  Do not drive or use heavy machinery while taking prescription pain medicine.  Get regular pelvic exams and Pap tests as often as told by your health care   provider.  Return to your normal activities as told by your health care provider. Ask your health care provider what activities are safe for you.  Do not use any products that contain nicotine or tobacco, such as cigarettes and e-cigarettes. If you need help quitting, ask your health care provider.  Keep all follow-up visits as told by your health care provider.  This is important. Contact a health care provider if:  Your periods are late, irregular, or painful, or they stop.  You have pelvic pain that does not go away.  You have pressure on your bladder or trouble emptying your bladder completely.  You have pain during sex.  You have any of the following in your abdomen: ? A feeling of fullness. ? Pressure. ? Discomfort. ? Pain that does not go away. ? Swelling.  You feel generally ill.  You become constipated.  You lose your appetite.  You develop severe acne.  You start to have more body hair and facial hair.  You are gaining weight or losing weight without changing your exercise and eating habits.  You think you may be pregnant. Get help right away if:  You have abdominal pain that is severe or gets worse.  You cannot eat or drink without vomiting.  You suddenly develop a fever.  Your menstrual period is much heavier than usual. This information is not intended to replace advice given to you by your health care provider. Make sure you discuss any questions you have with your health care provider. Document Released: 07/01/2005 Document Revised: 01/19/2016 Document Reviewed: 12/03/2015 Elsevier Interactive Patient Education  2018 [employer]  

## 2017-12-18 ENCOUNTER — Other Ambulatory Visit: Payer: Self-pay | Admitting: Obstetrics & Gynecology

## 2017-12-18 LAB — NUSWAB VAGINITIS PLUS (VG+)
ATOPOBIUM VAGINAE: HIGH {score} — AB
BVAB 2: HIGH Score — AB
CANDIDA ALBICANS, NAA: NEGATIVE
Candida glabrata, NAA: NEGATIVE
Chlamydia trachomatis, NAA: NEGATIVE
Megasphaera 1: HIGH Score — AB
NEISSERIA GONORRHOEAE, NAA: NEGATIVE
Trich vag by NAA: NEGATIVE

## 2017-12-18 MED ORDER — METRONIDAZOLE 0.75 % VA GEL: 1.0000 | Freq: Every day | VAGINAL | 0 refills | Status: AC

## 2017-12-19 ENCOUNTER — Other Ambulatory Visit: Payer: 59

## 2017-12-22 ENCOUNTER — Ambulatory Visit: Payer: 59 | Admitting: Obstetrics & Gynecology

## 2018-01-23 ENCOUNTER — Other Ambulatory Visit: Payer: Self-pay | Admitting: Family Medicine

## 2018-02-17 ENCOUNTER — Encounter: Payer: 59 | Admitting: Family Medicine

## 2018-02-23 ENCOUNTER — Other Ambulatory Visit: Payer: Self-pay | Admitting: Family Medicine

## 2018-03-09 ENCOUNTER — Encounter: Payer: 59 | Admitting: Family Medicine

## 2018-03-10 DIAGNOSIS — Z0289 Encounter for other administrative examinations: Secondary | ICD-10-CM

## 2018-04-20 ENCOUNTER — Other Ambulatory Visit: Payer: Self-pay | Admitting: Family Medicine

## 2018-04-20 NOTE — Telephone Encounter (Signed)
Must sched appt first/pt has been advised in the past/thx dmf

## 2018-04-23 ENCOUNTER — Other Ambulatory Visit: Payer: Self-pay | Admitting: Family Medicine

## 2018-04-23 ENCOUNTER — Encounter: Payer: Self-pay | Admitting: Family Medicine

## 2018-04-24 MED ORDER — BISOPROLOL-HYDROCHLOROTHIAZIDE 2.5-6.25 MG PO TABS: 1.0000 | ORAL_TABLET | Freq: Every day | ORAL | 0 refills | Status: DC

## 2018-04-24 MED ORDER — ALPRAZOLAM 0.5 MG PO TABS: ORAL_TABLET | ORAL | 0 refills | Status: DC

## 2018-04-24 NOTE — Telephone Encounter (Signed)
Okay to call in only one time only as she needs to update her UDS/CSC.

## 2018-05-01 ENCOUNTER — Ambulatory Visit: Payer: 59 | Admitting: Podiatry

## 2018-05-01 ENCOUNTER — Encounter: Payer: Self-pay | Admitting: Podiatry

## 2018-05-01 ENCOUNTER — Telehealth: Payer: Self-pay | Admitting: *Deleted

## 2018-05-01 ENCOUNTER — Encounter

## 2018-05-01 DIAGNOSIS — L600 Ingrowing nail: Secondary | ICD-10-CM

## 2018-05-01 MED ORDER — GENTAMICIN SULFATE 0.1 % EX CREA
1.0000 | TOPICAL_CREAM | Freq: Two times a day (BID) | CUTANEOUS | 1 refills | Status: DC
Start: 2018-05-01 — End: 2018-11-03

## 2018-05-01 NOTE — Telephone Encounter (Signed)
Brett Canales - [facility] [phone] states he will not be able to get the gentamicin cream until Monday, would it be possible to switch to the ointment. Left message ordering change to gentamicin ointment.

## 2018-05-01 NOTE — Progress Notes (Signed)
   Subjective: Patient presents today for evaluation of intermittent pain to the medial and lateral borders of the bilateral great toes that began several years ago. Patient is concerned for possible ingrown nail. Wearing shoes and touching the toes increases the pain. She has been applying triple antibiotic ointment for treatment. She denies drainage. Patient presents today for further treatment and evaluation.  Past Medical History:  Diagnosis Date  . Hypertension     Objective:  General: Well developed, nourished, in no acute distress, alert and oriented x3   Dermatology: Skin is warm, dry and supple bilateral. Medial and lateral borders of the bilateral great toes appears to be erythematous with evidence of an ingrowing nail. Pain on palpation noted to the border of the nail fold. The remaining nails appear unremarkable at this time. There are no open sores, lesions.  Vascular: Dorsalis Pedis artery and Posterior Tibial artery pedal pulses palpable. No lower extremity edema noted.   Neruologic: Grossly intact via light touch bilateral.  Musculoskeletal: Muscular strength within normal limits in all groups bilateral. Normal range of motion noted to all pedal and ankle joints.   Assesement: #1 Paronychia with ingrowing nail medial and lateral borders of the bilateral great toes #2 Pain in toe #3 Incurvated nail  Plan of Care:  1. Patient evaluated.  2. Discussed treatment alternatives and plan of care. Explained nail avulsion procedure and post procedure course to patient. 3. Patient opted for permanent partial nail avulsion to the medial and lateral borders of bilateral great toes.  4. Prior to procedure, local anesthesia infiltration utilized using 3 ml of a 50:50 mixture of 2% plain lidocaine and 0.5% plain marcaine in a normal hallux block fashion and a betadine prep performed.  5. Partial permanent nail avulsion with chemical matrixectomy performed using 3x30sec applications of  phenol followed by alcohol flush.  6. Light dressing applied. 7. Prescription for Gentamicin cream provided to patient.  8. Return to clinic in 2 weeks.   Investment banker, [employer] for [employer].    Felecia Shelling, DPM [facility]  Dr. Felecia Shelling, DPM    [address]                                        [place], [place] [address]                Office [phone]  Fax [phone]

## 2018-05-15 ENCOUNTER — Other Ambulatory Visit: Payer: Self-pay | Admitting: Family Medicine

## 2018-05-15 ENCOUNTER — Ambulatory Visit: Payer: 59 | Admitting: Podiatry

## 2018-05-15 DIAGNOSIS — I10 Essential (primary) hypertension: Secondary | ICD-10-CM | POA: Diagnosis not present

## 2018-05-15 DIAGNOSIS — E785 Hyperlipidemia, unspecified: Secondary | ICD-10-CM | POA: Diagnosis not present

## 2018-05-15 DIAGNOSIS — R5383 Other fatigue: Secondary | ICD-10-CM | POA: Diagnosis not present

## 2018-05-18 NOTE — Telephone Encounter (Signed)
TA-LOV-1.14.19/NOV-not scheduled/10.10.19 pt sent sessage that was not able to see you anymore as it is too far out/PMP ok no red flags/I edited it for enough for 90d with note to pharm to advise pt to schedule appt/plz advise/thx dmf

## 2018-06-30 DIAGNOSIS — M5412 Radiculopathy, cervical region: Secondary | ICD-10-CM | POA: Diagnosis not present

## 2018-07-20 DIAGNOSIS — M542 Cervicalgia: Secondary | ICD-10-CM | POA: Diagnosis not present

## 2018-07-23 ENCOUNTER — Encounter: Payer: Self-pay | Admitting: Family Medicine

## 2018-07-27 ENCOUNTER — Other Ambulatory Visit: Payer: Self-pay | Admitting: Family Medicine

## 2018-07-28 DIAGNOSIS — M5412 Radiculopathy, cervical region: Secondary | ICD-10-CM | POA: Diagnosis not present

## 2018-07-29 ENCOUNTER — Other Ambulatory Visit: Payer: Self-pay

## 2018-07-29 MED ORDER — NORETHINDRONE 0.35 MG PO TABS: 1.0000 | ORAL_TABLET | Freq: Every day | ORAL | 0 refills | Status: DC

## 2018-08-05 DIAGNOSIS — J209 Acute bronchitis, unspecified: Secondary | ICD-10-CM | POA: Diagnosis not present

## 2018-08-19 ENCOUNTER — Ambulatory Visit: Payer: 59 | Admitting: Family Medicine

## 2018-08-19 DIAGNOSIS — G5601 Carpal tunnel syndrome, right upper limb: Secondary | ICD-10-CM | POA: Diagnosis not present

## 2018-08-20 ENCOUNTER — Other Ambulatory Visit: Payer: Self-pay | Admitting: Family Medicine

## 2018-08-21 MED ORDER — NORETHINDRONE 0.35 MG PO TABS: 1.0000 | ORAL_TABLET | Freq: Every day | ORAL | 0 refills | Status: DC

## 2018-08-21 NOTE — Telephone Encounter (Signed)
RH-I am uncertain if you or someone else within that practice is her GYN/We received a refill request for her BCP and I am uncertain if it is appropriate and if pt is UTD with her care/plz advise/thx dmf

## 2018-08-21 NOTE — Telephone Encounter (Signed)
GYN sent this in/thx dmf

## 2018-08-27 ENCOUNTER — Other Ambulatory Visit: Payer: Self-pay

## 2018-08-27 MED ORDER — ALPRAZOLAM 0.5 MG PO TABS: ORAL_TABLET | ORAL | 5 refills | Status: DC

## 2018-08-27 NOTE — Telephone Encounter (Signed)
TA-Pt last seen on 2.5.20/[place] PMP reviewed and no red flags/plz advise/thx dmf

## 2018-09-22 ENCOUNTER — Ambulatory Visit (INDEPENDENT_AMBULATORY_CARE_PROVIDER_SITE_OTHER): Payer: 59 | Admitting: Family Medicine

## 2018-09-22 ENCOUNTER — Other Ambulatory Visit (HOSPITAL_COMMUNITY)
Admission: RE | Admit: 2018-09-22 | Discharge: 2018-09-22 | Disposition: A | Discharge status: Home / Self Care | Payer: 59 | Source: Ambulatory Visit | Attending: Family Medicine | Admitting: Family Medicine

## 2018-09-22 ENCOUNTER — Encounter: Payer: Self-pay | Admitting: Family Medicine

## 2018-09-22 VITALS — BP 140/90 | HR 85 | Temp 98.4°F | Ht 64.0 in | Wt 160.4 lb

## 2018-09-22 DIAGNOSIS — F17200 Nicotine dependence, unspecified, uncomplicated: Secondary | ICD-10-CM | POA: Diagnosis not present

## 2018-09-22 DIAGNOSIS — Z01419 Encounter for gynecological examination (general) (routine) without abnormal findings: Secondary | ICD-10-CM

## 2018-09-22 DIAGNOSIS — I10 Essential (primary) hypertension: Secondary | ICD-10-CM

## 2018-09-22 DIAGNOSIS — F411 Generalized anxiety disorder: Secondary | ICD-10-CM

## 2018-09-22 DIAGNOSIS — R5383 Other fatigue: Secondary | ICD-10-CM | POA: Insufficient documentation

## 2018-09-22 DIAGNOSIS — Z23 Encounter for immunization: Secondary | ICD-10-CM

## 2018-09-22 LAB — CBC WITH DIFFERENTIAL/PLATELET
BASOS PCT: 1.1 % (ref 0.0–3.0)
Basophils Absolute: 0.1 10*3/uL (ref 0.0–0.1)
Eosinophils Absolute: 0.1 10*3/uL (ref 0.0–0.7)
Eosinophils Relative: 1.3 % (ref 0.0–5.0)
HEMATOCRIT: 41.2 % (ref 36.0–46.0)
Hemoglobin: 14.2 g/dL (ref 12.0–15.0)
Lymphocytes Relative: 21.9 % (ref 12.0–46.0)
Lymphs Abs: 2.2 10*3/uL (ref 0.7–4.0)
MCHC: 34.5 g/dL (ref 30.0–36.0)
MCV: 104.2 fl — AB (ref 78.0–100.0)
Monocytes Absolute: 0.8 10*3/uL (ref 0.1–1.0)
Monocytes Relative: 8.2 % (ref 3.0–12.0)
Neutro Abs: 6.9 10*3/uL (ref 1.4–7.7)
Neutrophils Relative %: 67.5 % (ref 43.0–77.0)
Platelets: 340 10*3/uL (ref 150.0–400.0)
RBC: 3.95 Mil/uL (ref 3.87–5.11)
RDW: 13.2 % (ref 11.5–15.5)
WBC: 10.2 10*3/uL (ref 4.0–10.5)

## 2018-09-22 LAB — COMPREHENSIVE METABOLIC PANEL
ALT: 41 U/L — ABNORMAL HIGH (ref 0–35)
AST: 18 U/L (ref 0–37)
Albumin: 4.5 g/dL (ref 3.5–5.2)
Alkaline Phosphatase: 138 U/L — ABNORMAL HIGH (ref 39–117)
BUN: 21 mg/dL (ref 6–23)
CO2: 25 meq/L (ref 19–32)
Calcium: 9.8 mg/dL (ref 8.4–10.5)
Chloride: 104 mEq/L (ref 96–112)
Creatinine, Ser: 0.53 mg/dL (ref 0.40–1.20)
GFR: 128 mL/min (ref >60.00)
Glucose, Bld: 87 mg/dL (ref 70–99)
Potassium: 4.3 mEq/L (ref 3.5–5.1)
Sodium: 138 mEq/L (ref 135–145)
Total Bilirubin: 0.5 mg/dL (ref 0.2–1.2)
Total Protein: 6.8 g/dL (ref 6.0–8.3)

## 2018-09-22 LAB — VITAMIN D 25 HYDROXY (VIT D DEFICIENCY, FRACTURES): VITD: 33.74 ng/mL (ref 30.00–100.00)

## 2018-09-22 LAB — LIPID PANEL
CHOL/HDL RATIO: 4
Cholesterol: 220 mg/dL — ABNORMAL HIGH (ref 0–200)
HDL: 61.1 mg/dL (ref >39.00)
NonHDL: 159.18
Triglycerides: 270 mg/dL — ABNORMAL HIGH (ref 0.0–149.0)
VLDL: 54 mg/dL — AB (ref 0.0–40.0)

## 2018-09-22 LAB — TSH: TSH: 0.65 u[IU]/mL (ref 0.35–4.50)

## 2018-09-22 LAB — VITAMIN B12: Vitamin B-12: 635 pg/mL (ref 211–911)

## 2018-09-22 LAB — LDL CHOLESTEROL, DIRECT: Direct LDL: 125 mg/dL

## 2018-09-22 LAB — FERRITIN: Ferritin: 221.8 ng/mL (ref 10.0–291.0)

## 2018-09-22 MED ORDER — BUPROPION HCL ER (SMOKING DET) 150 MG PO TB12: 150.0000 mg | ORAL_TABLET | Freq: Two times a day (BID) | ORAL | 3 refills | Status: DC

## 2018-09-22 NOTE — Assessment & Plan Note (Signed)
The patient was counseled on the dangers of tobacco use, and was advised to quit. Reviewed strategies to maximize success, including removing cigarettes and smoking materials from environment.  She had history of seizures years ago but this was worked up and was told it was anxiety.  Could not tolerate chantix in past but she is interested in zyban.  We discussed potential of it lowering her seizure threshold.  She remembers taking wellbutrin in past and never having issues with it.  She hopes it will also help with some of her anxiety.  She is aware of risk of lowered seizure threshold.  eRx sent to pharmacy on file for zyban.

## 2018-09-22 NOTE — Assessment & Plan Note (Addendum)
Deteriorated.  See GAD7 and PHQ 9 screening. GAD 7 : Generalized Anxiety Score 09/22/2018  Nervous, Anxious, on Edge 1  Control/stop worrying 1  Worry too much - different things 1  Trouble relaxing 1  Restless 1  Easily annoyed or irritable 1  Afraid - awful might happen 1  Total GAD 7 Score 7    Depression screen [facility] 2/9 09/22/2018 02/06/2017  Decreased Interest 1 1  Down, Depressed, Hopeless 1 1  PHQ - 2 Score 2 2  Altered sleeping 1 0  Tired, decreased energy 1 0  Change in appetite 1 0  Feeling bad or failure about yourself  0 0  Trouble concentrating 0 0  Moving slowly or fidgety/restless 0 0  Suicidal thoughts 0 0  PHQ-9 Score 5 2  Difficult doing work/chores - Not difficult at all    See below- trial of zyban for both smoking cessation and worsening anxiety. The patient indicates understanding of these issues and agrees with the plan.  Total time spent with patient was 40 minutes, with 25 minutes spent specifically on problem visit concerning anxiety, smoking cessation and fatigue.   Greater than 50 percent of the 25 minutes was spent in counseling on smoking cessation, medication changes and her symptoms.

## 2018-09-22 NOTE — Assessment & Plan Note (Signed)
Reasonable control.  Smoking cessation should improve her BP as well. See above.

## 2018-09-22 NOTE — Progress Notes (Signed)
Subjective:   Patient ID: Helen Hashimoto, female    DOB: 06-09-1981, 38 y.o.   MRN: [id number]  NINAMARIE KEEL is a pleasant 38 y.o. year old female who presents to clinic today with Annual Exam (CPE-- not fasting(protein shake) PAP)  on 09/22/2018  HPI:  Has had one abnormal pap approximately 5 years ago per pt, but colposcopy was negative. Mammogram year ago. No family history of breast, uterine, or cervical CA.  Health Maintenance  Topic Date Due  . HIV Screening  02/02/1994  . TETANUS/TDAP  09/22/2019 (Originally 02/02/1998)  . PAP SMEAR-Modifier  02/13/2019  . INFLUENZA VACCINE  Completed   On micronor OCP.  HTN- has been well controlled on bisoprolol-HCTZ 2.5-6.25 mg daily. Denies HA, blurred vision, CP or SOB.  No LE edema.  BP Readings from Last 3 Encounters:  09/22/18 140/90  12/16/17 (!) 140/98  07/28/17 136/82     Lab Results  Component Value Date   CREATININE 0.75 02/06/2017   Anxiety- has been controlled with Remeron 15 mg twice daily and as needed xanax.  Work has been more stressful. She is still smoking.  GAD 7 : Generalized Anxiety Score 09/22/2018  Nervous, Anxious, on Edge 1  Control/stop worrying 1  Worry too much - different things 1  Trouble relaxing 1  Restless 1  Easily annoyed or irritable 1  Afraid - awful might happen 1  Total GAD 7 Score 7       Office Visit from 09/22/2018 in [facility]  PHQ-9 Total Score  5      Current Outpatient Medications on File Prior to Visit  Medication Sig Dispense Refill  . ALPRAZolam (XANAX) 0.5 MG tablet Take 1qd prn anxiety 30 tablet 5  . bisoprolol-hydrochlorothiazide (ZIAC) 2.5-6.25 MG tablet Take 1 tablet by mouth daily. 90 tablet 1  . gentamicin cream (GARAMYCIN) 0.1 % Apply 1 application topically 2 (two) times daily. 30 g 1  . mirtazapine (REMERON) 15 MG tablet TAKE 1 TABLET BY MOUTH TWICE DAILY 180 tablet 0  . Multiple Vitamin (MULTIVITAMIN) tablet Take 1 tablet by  mouth daily.    . norethindrone (MICRONOR,CAMILA,ERRIN) 0.35 MG tablet Take 1 tablet (0.35 mg total) by mouth daily. 3 Package 0   No current facility-administered medications on file prior to visit.     Allergies  Allergen Reactions  . Chantix [Varenicline Tartrate] Anxiety    Past Medical History:  Diagnosis Date  . Hypertension     No past surgical history on file.  Family History  Problem Relation Age of Onset  . Alcohol abuse Other   . Hyperlipidemia Mother   . Hypertension Mother   . COPD Father   . Heart disease Father   . Hypertension Father   . Cancer Maternal Aunt        breast    Social History   Socioeconomic History  . Marital status: Married    Spouse name: Not on file  . Number of children: 2  . Years of education: Not on file  . Highest education level: Not on file  Occupational History  . Occupation: Geophysicist/field seismologist: [employer]  Social Needs  . Financial resource strain: Not on file  . Food insecurity:    Worry: Not on file    Inability: Not on file  . Transportation needs:    Medical: Not on file    Non-medical: Not on file  Tobacco Use  . Smoking status:  Current Every Day Smoker    Packs/day: 1.00    Types: Cigarettes  . Smokeless tobacco: Never Used  Substance and Sexual Activity  . Alcohol use: Yes    Comment: social   . Drug use: Never  . Sexual activity: Yes    Birth control/protection: Pill  Lifestyle  . Physical activity:    Days per week: Not on file    Minutes per session: Not on file  . Stress: Not on file  Relationships  . Social connections:    Talks on phone: Not on file    Gets together: Not on file    Attends religious service: Not on file    Active member of club or organization: Not on file    Attends meetings of clubs or organizations: Not on file    Relationship status: Not on file  . Intimate partner violence:    Fear of current or ex partner: Not on file    Emotionally abused: Not on  file    Physically abused: Not on file    Forced sexual activity: Not on file  Other Topics Concern  . Not on file  Social History Narrative  . Not on file   The PMH, PSH, Social History, Family History, Medications, and allergies have been reviewed in [facility], and have been updated if relevant.   Review of Systems  Constitutional: Positive for fatigue.  HENT: Negative.   Eyes: Negative.   Respiratory: Negative.   Cardiovascular: Negative.   Endocrine: Negative.   Genitourinary: Negative.   Musculoskeletal: Negative.   Skin: Negative.   Allergic/Immunologic: Negative.   Neurological: Negative.   Hematological: Negative.   Psychiatric/Behavioral: Positive for sleep disturbance. Negative for agitation, behavioral problems, confusion, decreased concentration, dysphoric mood, hallucinations, self-injury and suicidal ideas. The patient is nervous/anxious. The patient is not hyperactive.   All other systems reviewed and are negative.      Objective:    BP 140/90   Pulse 85   Temp 98.4 F (36.9 C) (Oral)   Ht 5\' 4"  (1.626 m)   Wt 160 lb 6.4 oz (72.8 kg)   SpO2 98%   BMI 27.53 kg/m   Wt Readings from Last 3 Encounters:  09/22/18 160 lb 6.4 oz (72.8 kg)  12/16/17 154 lb (69.9 kg)  07/28/17 154 lb 6.4 oz (70 kg)    Physical Exam   General:  [name],well-nourished,in no acute distress; alert,appropriate and cooperative throughout examination Head:  normocephalic and atraumatic.   Eyes:  vision grossly intact, PERRL Ears:  R ear normal and L ear normal externally, TMs clear bilaterally Nose:  no external deformity.   Mouth:  good dentition.   Neck:  No deformities, masses, or tenderness noted. Breasts:  No mass, nodules, thickening, tenderness, bulging, retraction, inflamation, nipple discharge or skin changes noted.   Lungs:  Normal respiratory effort, chest expands symmetrically. Lungs are clear to auscultation, no crackles or wheezes. Heart:  Normal rate and  regular rhythm. S1 and S2 normal without gallop, murmur, click, rub or other extra sounds. Abdomen:  Bowel sounds positive,abdomen soft and non-tender without masses, organomegaly or hernias noted. Rectal:  no external abnormalities.   Genitalia:  Pelvic Exam:        External: normal female genitalia without lesions or masses        Vagina: normal without lesions or masses        Cervix: normal without lesions or masses        Adnexa: normal bimanual exam without  masses or fullness        Uterus: normal by palpation        Pap smear: performed Msk:  No deformity or scoliosis noted of thoracic or lumbar spine.   Extremities:  No clubbing, cyanosis, edema, or deformity noted with normal full range of motion of all joints.   Neurologic:  alert & oriented X3 and gait normal.   Skin:  Intact without suspicious lesions or rashes Cervical Nodes:  No lymphadenopathy noted Axillary Nodes:  No palpable lymphadenopathy Psych:  Cognition and judgment appear intact. Alert and cooperative with normal attention span and concentration. No apparent delusions, illusions, hallucinations       Assessment & Plan:   Well woman exam with routine gynecological exam  Essential hypertension - Plan: CBC with Differential/Platelet, Comprehensive metabolic panel, Lipid panel, TSH  Generalized anxiety disorder  Need for influenza vaccination - Plan: Flu Vaccine QUAD 6+ mos PF IM (Fluarix Quad PF)  Other fatigue - Plan: Ferritin, Vitamin D (25 hydroxy), B12  CIGARETTE SMOKER No follow-ups on file.

## 2018-09-22 NOTE — Patient Instructions (Signed)
Great to see you. I will call you with your lab results from today and you can view them online.   We are starting zyban 150 mg twice daily.  Please keep me updated.

## 2018-09-22 NOTE — Assessment & Plan Note (Signed)
Reviewed preventive care protocols, scheduled due services, and updated immunizations Discussed nutrition, exercise, diet, and healthy lifestyle.  Pap smear done today. 

## 2018-09-22 NOTE — Assessment & Plan Note (Signed)
New- likely multifactorial- work stress, not sleeping as well. Will add additional labs today to rule out other possible contributing factors. The patient indicates understanding of these issues and agrees with the plan. Orders Placed This Encounter  Procedures  . Flu Vaccine QUAD 6+ mos PF IM (Fluarix Quad PF)  . CBC with Differential/Platelet  . Comprehensive metabolic panel  . Lipid panel  . TSH  . Ferritin  . Vitamin D (25 hydroxy)  . B12

## 2018-09-24 LAB — CYTOLOGY - PAP
Diagnosis: NEGATIVE
HPV 16/18/45 genotyping: NEGATIVE
HPV: DETECTED — AB

## 2018-10-02 ENCOUNTER — Other Ambulatory Visit: Payer: Self-pay | Admitting: Family Medicine

## 2018-10-02 ENCOUNTER — Encounter: Payer: Self-pay | Admitting: Family Medicine

## 2018-10-02 MED ORDER — HYDROCOD POLST-CPM POLST ER 10-8 MG/5ML PO SUER: 5.0000 mL | Freq: Two times a day (BID) | ORAL | 0 refills | Status: DC | PRN

## 2018-11-01 ENCOUNTER — Other Ambulatory Visit: Payer: Self-pay

## 2018-11-01 ENCOUNTER — Emergency Department (HOSPITAL_COMMUNITY)
Admission: EM | Admit: 2018-11-01 | Discharge: 2018-11-02 | Disposition: A | Discharge status: Other Acute Inpatient Hospital | Payer: 59 | Source: Home / Self Care | Attending: Emergency Medicine | Admitting: Emergency Medicine

## 2018-11-01 DIAGNOSIS — Z79899 Other long term (current) drug therapy: Secondary | ICD-10-CM | POA: Insufficient documentation

## 2018-11-01 DIAGNOSIS — I10 Essential (primary) hypertension: Secondary | ICD-10-CM | POA: Insufficient documentation

## 2018-11-01 DIAGNOSIS — F4325 Adjustment disorder with mixed disturbance of emotions and conduct: Secondary | ICD-10-CM | POA: Diagnosis not present

## 2018-11-01 DIAGNOSIS — F17210 Nicotine dependence, cigarettes, uncomplicated: Secondary | ICD-10-CM

## 2018-11-01 DIAGNOSIS — F1110 Opioid abuse, uncomplicated: Secondary | ICD-10-CM | POA: Insufficient documentation

## 2018-11-01 DIAGNOSIS — F332 Major depressive disorder, recurrent severe without psychotic features: Secondary | ICD-10-CM | POA: Insufficient documentation

## 2018-11-01 DIAGNOSIS — R45851 Suicidal ideations: Secondary | ICD-10-CM

## 2018-11-01 DIAGNOSIS — F1010 Alcohol abuse, uncomplicated: Secondary | ICD-10-CM

## 2018-11-01 DIAGNOSIS — F411 Generalized anxiety disorder: Secondary | ICD-10-CM | POA: Diagnosis present

## 2018-11-01 LAB — URINE DRUG SCREEN, QUALITATIVE (ARMC ONLY)
Amphetamines, Ur Screen: NOT DETECTED
Barbiturates, Ur Screen: NOT DETECTED
Benzodiazepine, Ur Scrn: POSITIVE — AB
Cannabinoid 50 Ng, Ur ~~LOC~~: NOT DETECTED
Cocaine Metabolite,Ur ~~LOC~~: NOT DETECTED
MDMA (Ecstasy)Ur Screen: NOT DETECTED
Methadone Scn, Ur: NOT DETECTED
Opiate, Ur Screen: NOT DETECTED
Phencyclidine (PCP) Ur S: NOT DETECTED
Tricyclic, Ur Screen: NOT DETECTED

## 2018-11-01 LAB — CBC
HCT: 46.7 % — ABNORMAL HIGH (ref 36.0–46.0)
Hemoglobin: 16.4 g/dL — ABNORMAL HIGH (ref 12.0–15.0)
MCH: 34.9 pg — ABNORMAL HIGH (ref 26.0–34.0)
MCHC: 35.1 g/dL (ref 30.0–36.0)
MCV: 99.4 fL (ref 80.0–100.0)
Platelets: 354 10*3/uL (ref 150–400)
RBC: 4.7 MIL/uL (ref 3.87–5.11)
RDW: 12.8 % (ref 11.5–15.5)
WBC: 9.3 10*3/uL (ref 4.0–10.5)
nRBC: 0 % (ref 0.0–0.2)

## 2018-11-01 LAB — COMPREHENSIVE METABOLIC PANEL
ALT: 35 U/L (ref 0–44)
AST: 48 U/L — ABNORMAL HIGH (ref 15–41)
Albumin: 5.1 g/dL — ABNORMAL HIGH (ref 3.5–5.0)
Alkaline Phosphatase: 117 U/L (ref 38–126)
Anion gap: 14 (ref 5–15)
BUN: 16 mg/dL (ref 6–20)
CO2: 24 mmol/L (ref 22–32)
Calcium: 9 mg/dL (ref 8.9–10.3)
Chloride: 105 mmol/L (ref 98–111)
Creatinine, Ser: 0.59 mg/dL (ref 0.44–1.00)
GFR calc Af Amer: >60 mL/min (ref >60)
GFR calc non Af Amer: >60 mL/min (ref >60)
Glucose, Bld: 124 mg/dL — ABNORMAL HIGH (ref 70–99)
Potassium: 3.5 mmol/L (ref 3.5–5.1)
Sodium: 143 mmol/L (ref 135–145)
Total Bilirubin: 0.9 mg/dL (ref 0.3–1.2)
Total Protein: 8.4 g/dL — ABNORMAL HIGH (ref 6.5–8.1)

## 2018-11-01 LAB — SALICYLATE LEVEL: Salicylate Lvl: <7 mg/dL (ref 2.8–30.0)

## 2018-11-01 LAB — ACETAMINOPHEN LEVEL: Acetaminophen (Tylenol), Serum: <10 ug/mL — ABNORMAL LOW (ref 10–30)

## 2018-11-01 LAB — ETHANOL: Alcohol, Ethyl (B): 316 mg/dL (ref <10)

## 2018-11-01 MED ORDER — VITAMIN B-1 100 MG PO TABS
100.0000 mg | ORAL_TABLET | Freq: Every day | ORAL | Status: DC
  Administered 2018-11-01 – 2018-11-02 (×2): 100 mg via ORAL
  Filled 2018-11-01 (×2): qty 1

## 2018-11-01 MED ORDER — LORAZEPAM 2 MG/ML IJ SOLN: 0.0000 mg | Freq: Two times a day (BID) | INTRAMUSCULAR | Status: DC

## 2018-11-01 MED ORDER — THIAMINE HCL 100 MG/ML IJ SOLN: 100.0000 mg | Freq: Every day | INTRAMUSCULAR | Status: DC

## 2018-11-01 MED ORDER — LORAZEPAM 2 MG/ML IJ SOLN: 0.0000 mg | Freq: Four times a day (QID) | INTRAMUSCULAR | Status: DC

## 2018-11-01 MED ORDER — NORETHINDRONE 0.35 MG PO TABS: 1.0000 | ORAL_TABLET | Freq: Every day | ORAL | Status: DC

## 2018-11-01 MED ORDER — ALPRAZOLAM 0.5 MG PO TABS
0.5000 mg | ORAL_TABLET | Freq: Every day | ORAL | Status: DC
  Administered 2018-11-01: 22:00:00 0.5 mg via ORAL
  Filled 2018-11-01: qty 1

## 2018-11-01 MED ORDER — LORAZEPAM 1 MG PO TABS: 1.0000 mg | ORAL_TABLET | Freq: Once | ORAL | Status: DC

## 2018-11-01 MED ORDER — CHLORDIAZEPOXIDE HCL 25 MG PO CAPS
50.0000 mg | ORAL_CAPSULE | Freq: Three times a day (TID) | ORAL | Status: DC
  Administered 2018-11-01 – 2018-11-02 (×2): 50 mg via ORAL
  Filled 2018-11-01 (×2): qty 2

## 2018-11-01 MED ORDER — BISOPROLOL-HYDROCHLOROTHIAZIDE 2.5-6.25 MG PO TABS
1.0000 | ORAL_TABLET | Freq: Every day | ORAL | Status: DC
  Administered 2018-11-01 – 2018-11-02 (×2): 1 via ORAL
  Filled 2018-11-01 (×2): qty 1

## 2018-11-01 MED ORDER — MIRTAZAPINE 15 MG PO TABS
15.0000 mg | ORAL_TABLET | Freq: Two times a day (BID) | ORAL | Status: DC
  Administered 2018-11-01 – 2018-11-02 (×2): 15 mg via ORAL
  Filled 2018-11-01 (×2): qty 1

## 2018-11-01 MED ORDER — LORAZEPAM 2 MG PO TABS
0.0000 mg | ORAL_TABLET | Freq: Four times a day (QID) | ORAL | Status: DC
  Administered 2018-11-02 (×2): 1 mg via ORAL
  Filled 2018-11-01 (×3): qty 1

## 2018-11-01 MED ORDER — ONDANSETRON HCL 4 MG PO TABS
4.0000 mg | ORAL_TABLET | Freq: Three times a day (TID) | ORAL | Status: DC | PRN
  Filled 2018-11-01: qty 1

## 2018-11-01 MED ORDER — NICOTINE 21 MG/24HR TD PT24
21.0000 mg | MEDICATED_PATCH | Freq: Every day | TRANSDERMAL | Status: DC
  Administered 2018-11-01 – 2018-11-02 (×2): 21 mg via TRANSDERMAL
  Filled 2018-11-01 (×2): qty 1

## 2018-11-01 MED ORDER — LORAZEPAM 2 MG PO TABS: 0.0000 mg | ORAL_TABLET | Freq: Two times a day (BID) | ORAL | Status: DC

## 2018-11-01 NOTE — ED Provider Notes (Signed)
[facility] Emergency Department Provider Note  ____________________________________________  Time seen: Approximately 8:20 PM  I have reviewed the triage vital signs and the nursing notes.   HISTORY  Chief Complaint Psychiatric Evaluation   HPI [name] Phillips is a 38 y.o. female with a history of hypertension, generalized anxiety disorder, opiate abuse, alcohol abuse, smoking who presents for evaluation of suicidal thoughts.  Patient reports that she was started 3 weeks ago and Wellbutrin for worsening depression.  She reports that she has been having suicidal thoughts now for 2 weeks.  She reports today she had a knife and was thinking about stabbing herself.  She was brought in by her husband.  She denies ever having suicidal thoughts in the past.  She denies homicidal thoughts.  She reports a heavy daily drinking for several years.  She is also been addicted to opiates for several years.  Last alcohol was 4 hours prior to arrival.  She has no history of complicated withdrawals in the past.  Patient endorses severe depression for several months now due to having a strange son and not liking her job.  She denies any medical complaints.  Past Medical History:  Diagnosis Date  . Hypertension     Patient Active Problem List   Diagnosis Date Noted  . Well woman exam with routine gynecological exam 09/22/2018  . Fatigue 09/22/2018  . History of ovarian cyst 12/16/2017  . Premenstrual syndrome 07/28/2017  . HTN (hypertension) 05/03/2011  . Generalized anxiety disorder 04/05/2011  . CIGARETTE SMOKER 02/02/2007    No past surgical history on file.  Prior to Admission medications   Medication Sig Start Date End Date Taking? Authorizing Provider  ALPRAZolam Prudy Feeler) 0.5 MG tablet Take 1qd prn anxiety 08/27/18   Dianne Dun, MD  bisoprolol-hydrochlorothiazide [facility]) 2.5-6.25 MG tablet Take 1 tablet by mouth daily. 07/28/17   Dianne Dun, MD  buPROPion  (ZYBAN) 150 MG 12 hr tablet Take 1 tablet (150 mg total) by mouth 2 (two) times daily. 09/22/18   Dianne Dun, MD  chlorpheniramine-HYDROcodone [facility] PENNKINETIC ER) 10-8 MG/5ML SUER Take 5 mLs by mouth every 12 (twelve) hours as needed. 10/02/18   Dianne Dun, MD  gentamicin cream (GARAMYCIN) 0.1 % Apply 1 application topically 2 (two) times daily. 05/01/18   Felecia Shelling, DPM  mirtazapine (REMERON) 15 MG tablet TAKE 1 TABLET BY MOUTH TWICE DAILY 05/18/18   Dianne Dun, MD  Multiple Vitamin (MULTIVITAMIN) tablet Take 1 tablet by mouth daily.    [provider]  norethindrone (MICRONOR,CAMILA,ERRIN) 0.35 MG tablet Take 1 tablet (0.35 mg total) by mouth daily. 08/21/18   Nadara Mustard, MD    Allergies Chantix [varenicline tartrate]  Family History  Problem Relation Age of Onset  . Alcohol abuse Other   . Hyperlipidemia Mother   . Hypertension Mother   . COPD Father   . Heart disease Father   . Hypertension Father   . Cancer Maternal Aunt        breast    Social History Social History   Tobacco Use  . Smoking status: Current Every Day Smoker    Packs/day: 1.00    Types: Cigarettes  . Smokeless tobacco: Never Used  Substance Use Topics  . Alcohol use: Yes    Comment: social   . Drug use: Never    Review of Systems  Constitutional: Negative for fever. Eyes: Negative for visual changes. ENT: Negative for sore throat. Neck: No  neck pain  Cardiovascular: Negative for chest pain. Respiratory: Negative for shortness of breath. Gastrointestinal: Negative for abdominal pain, vomiting or diarrhea. Genitourinary: Negative for dysuria. Musculoskeletal: Negative for back pain. Skin: Negative for rash. Neurological: Negative for headaches, weakness or numbness. Psych: + depression and SI. No HI  ____________________________________________   PHYSICAL EXAM:  VITAL SIGNS: ED Triage Vitals  Enc Vitals Group     BP 11/01/18 1949 (!) 183/103     Pulse Rate  11/01/18 1949 (!) 126     Resp 11/01/18 1949 (!) 22     Temp 11/01/18 1949 98.3 F (36.8 C)     Temp Source 11/01/18 1949 Oral     SpO2 11/01/18 1949 97 %     Weight 11/01/18 1948 158 lb (71.7 kg)     Height 11/01/18 1948  (1.626 m)     Head Circumference --      Peak Flow --      Pain Score 11/01/18 1948 0     Pain Loc --      Pain Edu? --      Excl. in GC? --     Constitutional: Alert and oriented. Well appearing and in no apparent distress. HEENT:      Head: Normocephalic and atraumatic.         Eyes: Conjunctivae are normal. Sclera is non-icteric.       Mouth/Throat: Mucous membranes are moist.       Neck: Supple with no signs of meningismus. Cardiovascular: Tachycardic with regular rhythm. No murmurs, gallops, or rubs. 2+ symmetrical distal pulses are present in all extremities. No JVD. Respiratory: Normal respiratory effort. Lungs are clear to auscultation bilaterally. No wheezes, crackles, or rhonchi.  Gastrointestinal: Soft, non tender, and non distended with positive bowel sounds. No rebound or guarding. Musculoskeletal: Nontender with normal range of motion in all extremities. No edema, cyanosis, or erythema of extremities. Neurologic: Normal speech and language. Face is symmetric. Moving all extremities. No gross focal neurologic deficits are appreciated. Skin: Skin is warm, dry and intact. No rash noted. Psychiatric: Mood and affect are depressed. Speech and behavior are normal.  ____________________________________________   LABS (all labs ordered are listed, but only abnormal results are displayed)  Labs Reviewed  COMPREHENSIVE METABOLIC PANEL - Abnormal; Notable for the following components:      Result Value   Glucose, Bld 124 (*)    Total Protein 8.4 (*)    Albumin 5.1 (*)    AST 48 (*)    All other components within normal limits  ETHANOL - Abnormal; Notable for the following components:   Alcohol, Ethyl (B) 316 (*)    All other components within  normal limits  ACETAMINOPHEN LEVEL - Abnormal; Notable for the following components:   Acetaminophen (Tylenol), Serum <10 (*)    All other components within normal limits  CBC - Abnormal; Notable for the following components:   Hemoglobin 16.4 (*)    HCT 46.7 (*)    MCH 34.9 (*)    All other components within normal limits  URINE DRUG SCREEN, QUALITATIVE ([facility] ONLY) - Abnormal; Notable for the following components:   Benzodiazepine, Ur Scrn POSITIVE (*)    All other components within normal limits  SALICYLATE LEVEL  POC URINE PREG, ED   ____________________________________________  EKG  none  ____________________________________________  RADIOLOGY  none  ____________________________________________   PROCEDURES  Procedure(s) performed: None Procedures Critical Care performed:  None ____________________________________________   INITIAL IMPRESSION / ASSESSMENT AND PLAN /  ED COURSE   38 y.o. female with a history of hypertension, generalized anxiety disorder, opiate abuse, alcohol abuse, smoking who presents for evaluation of suicidal thoughts and plan to stab herself. Patient reports having a knife in her hands today and thinking about using it. Therefore patient will be IVC for psychiatric evaluation. Patient placed on CIWA, will initiate librium to prevent patient from going into withdrawals. Labs showing EtOH 316 and Utox positive for benzos. Patient is medically cleared at this time.      As part of my medical decision making, I reviewed the following data within the electronic MEDICAL RECORD NUMBER Nursing notes reviewed and incorporated, Labs reviewed , Old chart reviewed, A consult was requested and obtained from this/these consultant(s) Psychiatry, Notes from prior ED visits and [place] Controlled Substance Database    Pertinent labs & imaging results that were available during my care of the patient were reviewed by me and considered in my medical decision making (see  chart for details).    ____________________________________________   FINAL CLINICAL IMPRESSION(S) / ED DIAGNOSES  Final diagnoses:  Alcohol abuse  Opiate abuse, continuous (HCC)  Severe episode of recurrent major depressive disorder, without psychotic features (HCC)  Suicidal thoughts      NEW MEDICATIONS STARTED DURING THIS VISIT:  ED Discharge Orders    None       Note:  This document was prepared using Dragon voice recognition software and may include unintentional dictation errors.    Don PerkingVeronese, [place], MD 11/01/18 2141

## 2018-11-01 NOTE — ED Notes (Signed)
Hourly rounding reveals patient sleeping in room. No complaints, stable, in no acute distress. Q15 minute rounds and monitoring via Security Cameras to continue. 
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
## 2018-11-01 NOTE — ED Notes (Signed)
Pt. Transferred to [facility] from ED to room 1 after screening for contraband. Report to include Situation, Background, Assessment and Recommendations from Ann RN. Pt. Oriented to unit including Q15 minute rounds as well as the security cameras for their protection. Patient is alert and oriented, warm and dry in no acute distress. Patient denies SI, HI, and AVH. Pt. Encouraged to let me know if needs arise. 

## 2018-11-01 NOTE — ED Triage Notes (Signed)
Patient reports having thoughts of self harm.  Patient reports recently started taking Wellbutrin.

## 2018-11-02 ENCOUNTER — Inpatient Hospital Stay
Admission: AD | Admit: 2018-11-02 | Discharge: 2018-11-03 | DRG: 882 | Disposition: A | Discharge status: Home / Self Care | Payer: 59 | Attending: Psychiatry | Admitting: Psychiatry

## 2018-11-02 ENCOUNTER — Other Ambulatory Visit: Payer: Self-pay

## 2018-11-02 DIAGNOSIS — F411 Generalized anxiety disorder: Secondary | ICD-10-CM | POA: Diagnosis present

## 2018-11-02 DIAGNOSIS — Z79899 Other long term (current) drug therapy: Secondary | ICD-10-CM

## 2018-11-02 DIAGNOSIS — Z818 Family history of other mental and behavioral disorders: Secondary | ICD-10-CM

## 2018-11-02 DIAGNOSIS — F17210 Nicotine dependence, cigarettes, uncomplicated: Secondary | ICD-10-CM | POA: Diagnosis present

## 2018-11-02 DIAGNOSIS — Z888 Allergy status to other drugs, medicaments and biological substances status: Secondary | ICD-10-CM

## 2018-11-02 DIAGNOSIS — F4325 Adjustment disorder with mixed disturbance of emotions and conduct: Principal | ICD-10-CM

## 2018-11-02 DIAGNOSIS — F1010 Alcohol abuse, uncomplicated: Secondary | ICD-10-CM

## 2018-11-02 DIAGNOSIS — Z8249 Family history of ischemic heart disease and other diseases of the circulatory system: Secondary | ICD-10-CM

## 2018-11-02 DIAGNOSIS — I10 Essential (primary) hypertension: Secondary | ICD-10-CM | POA: Diagnosis present

## 2018-11-02 DIAGNOSIS — Z7141 Alcohol abuse counseling and surveillance of alcoholic: Secondary | ICD-10-CM

## 2018-11-02 DIAGNOSIS — F322 Major depressive disorder, single episode, severe without psychotic features: Secondary | ICD-10-CM

## 2018-11-02 DIAGNOSIS — F332 Major depressive disorder, recurrent severe without psychotic features: Secondary | ICD-10-CM | POA: Insufficient documentation

## 2018-11-02 DIAGNOSIS — Z720 Tobacco use: Secondary | ICD-10-CM

## 2018-11-02 DIAGNOSIS — R45851 Suicidal ideations: Secondary | ICD-10-CM | POA: Diagnosis not present

## 2018-11-02 HISTORY — DX: Alcohol abuse, uncomplicated: F10.10

## 2018-11-02 LAB — PREGNANCY, URINE: Preg Test, Ur: NEGATIVE

## 2018-11-02 MED ORDER — LORAZEPAM 2 MG PO TABS
0.0000 mg | ORAL_TABLET | Freq: Four times a day (QID) | ORAL | Status: DC
  Administered 2018-11-03: 2 mg via ORAL
  Filled 2018-11-02: qty 1

## 2018-11-02 MED ORDER — CHLORDIAZEPOXIDE HCL 25 MG PO CAPS
25.0000 mg | ORAL_CAPSULE | Freq: Four times a day (QID) | ORAL | Status: DC
  Administered 2018-11-02 – 2018-11-03 (×3): 25 mg via ORAL
  Filled 2018-11-02 (×3): qty 1

## 2018-11-02 MED ORDER — MIRTAZAPINE 15 MG PO TABS
15.0000 mg | ORAL_TABLET | Freq: Two times a day (BID) | ORAL | Status: DC
  Administered 2018-11-02 – 2018-11-03 (×2): 15 mg via ORAL
  Filled 2018-11-02 (×2): qty 1

## 2018-11-02 MED ORDER — THIAMINE HCL 100 MG/ML IJ SOLN: 100.0000 mg | Freq: Once | INTRAMUSCULAR | Status: DC

## 2018-11-02 MED ORDER — LORAZEPAM 2 MG/ML IJ SOLN: 0.0000 mg | Freq: Four times a day (QID) | INTRAMUSCULAR | Status: DC

## 2018-11-02 MED ORDER — ALUM & MAG HYDROXIDE-SIMETH 200-200-20 MG/5ML PO SUSP: 30.0000 mL | ORAL | Status: DC | PRN

## 2018-11-02 MED ORDER — VITAMIN B-1 100 MG PO TABS
100.0000 mg | ORAL_TABLET | Freq: Every day | ORAL | Status: DC
  Administered 2018-11-03: 100 mg via ORAL

## 2018-11-02 MED ORDER — ADULT MULTIVITAMIN W/MINERALS CH
1.0000 | ORAL_TABLET | Freq: Every day | ORAL | Status: DC
  Administered 2018-11-03: 1 via ORAL
  Filled 2018-11-02 (×2): qty 1

## 2018-11-02 MED ORDER — LORAZEPAM 2 MG PO TABS
2.0000 mg | ORAL_TABLET | Freq: Once | ORAL | Status: AC
  Administered 2018-11-02: 2 mg via ORAL

## 2018-11-02 MED ORDER — ACETAMINOPHEN 325 MG PO TABS: 650.0000 mg | ORAL_TABLET | Freq: Four times a day (QID) | ORAL | Status: DC | PRN

## 2018-11-02 MED ORDER — MAGNESIUM HYDROXIDE 400 MG/5ML PO SUSP: 30.0000 mL | Freq: Every day | ORAL | Status: DC | PRN

## 2018-11-02 MED ORDER — ALPRAZOLAM 0.5 MG PO TABS
1.0000 mg | ORAL_TABLET | Freq: Once | ORAL | Status: AC
  Administered 2018-11-02: 1 mg via ORAL
  Filled 2018-11-02: qty 2

## 2018-11-02 MED ORDER — NORETHINDRONE 0.35 MG PO TABS
1.0000 | ORAL_TABLET | Freq: Every day | ORAL | Status: DC
  Administered 2018-11-02: 0.35 mg via ORAL
  Filled 2018-11-02 (×2): qty 1

## 2018-11-02 MED ORDER — NICOTINE 21 MG/24HR TD PT24
21.0000 mg | MEDICATED_PATCH | Freq: Every day | TRANSDERMAL | Status: DC
  Administered 2018-11-03: 21 mg via TRANSDERMAL
  Filled 2018-11-02: qty 1

## 2018-11-02 MED ORDER — HYDROXYZINE HCL 25 MG PO TABS: 25.0000 mg | ORAL_TABLET | Freq: Four times a day (QID) | ORAL | Status: DC | PRN

## 2018-11-02 MED ORDER — LORAZEPAM 2 MG PO TABS: 0.0000 mg | ORAL_TABLET | Freq: Two times a day (BID) | ORAL | Status: DC

## 2018-11-02 MED ORDER — CHLORDIAZEPOXIDE HCL 25 MG PO CAPS: 25.0000 mg | ORAL_CAPSULE | Freq: Four times a day (QID) | ORAL | Status: DC | PRN

## 2018-11-02 MED ORDER — HYDROXYZINE HCL 50 MG PO TABS
50.0000 mg | ORAL_TABLET | Freq: Three times a day (TID) | ORAL | Status: DC | PRN
  Administered 2018-11-02 (×2): 50 mg via ORAL
  Filled 2018-11-02: qty 1

## 2018-11-02 MED ORDER — VITAMIN B-1 100 MG PO TABS
100.0000 mg | ORAL_TABLET | Freq: Every day | ORAL | Status: DC
  Filled 2018-11-02: qty 1

## 2018-11-02 MED ORDER — ALPRAZOLAM 0.5 MG PO TABS
0.5000 mg | ORAL_TABLET | Freq: Every day | ORAL | Status: DC
  Administered 2018-11-02: 0.5 mg via ORAL
  Filled 2018-11-02: qty 1

## 2018-11-02 MED ORDER — LORAZEPAM 2 MG/ML IJ SOLN: 0.0000 mg | Freq: Two times a day (BID) | INTRAMUSCULAR | Status: DC

## 2018-11-02 MED ORDER — ADULT MULTIVITAMIN W/MINERALS CH
1.0000 | ORAL_TABLET | Freq: Every day | ORAL | Status: DC
  Filled 2018-11-02: qty 1

## 2018-11-02 MED ORDER — LOPERAMIDE HCL 2 MG PO CAPS: 2.0000 mg | ORAL_CAPSULE | ORAL | Status: DC | PRN

## 2018-11-02 MED ORDER — BISOPROLOL-HYDROCHLOROTHIAZIDE 2.5-6.25 MG PO TABS
1.0000 | ORAL_TABLET | Freq: Every day | ORAL | Status: DC
  Administered 2018-11-03: 06:00:00 1 via ORAL
  Filled 2018-11-02: qty 1

## 2018-11-02 MED ORDER — CHLORDIAZEPOXIDE HCL 25 MG PO CAPS: 25.0000 mg | ORAL_CAPSULE | ORAL | Status: DC

## 2018-11-02 MED ORDER — CHLORDIAZEPOXIDE HCL 25 MG PO CAPS: 25.0000 mg | ORAL_CAPSULE | Freq: Three times a day (TID) | ORAL | Status: DC

## 2018-11-02 MED ORDER — ONDANSETRON 4 MG PO TBDP
4.0000 mg | ORAL_TABLET | Freq: Four times a day (QID) | ORAL | Status: DC | PRN
  Administered 2018-11-03: 4 mg via ORAL
  Filled 2018-11-02: qty 1

## 2018-11-02 MED ORDER — ADULT MULTIVITAMIN W/MINERALS CH
1.0000 | ORAL_TABLET | Freq: Every day | ORAL | Status: DC
  Administered 2018-11-02: 1 via ORAL

## 2018-11-02 MED ORDER — THIAMINE HCL 100 MG/ML IJ SOLN: 100.0000 mg | Freq: Every day | INTRAMUSCULAR | Status: DC

## 2018-11-02 MED ORDER — CHLORDIAZEPOXIDE HCL 25 MG PO CAPS: 25.0000 mg | ORAL_CAPSULE | Freq: Every day | ORAL | Status: DC

## 2018-11-02 MED ORDER — ONDANSETRON HCL 4 MG PO TABS: 4.0000 mg | ORAL_TABLET | Freq: Three times a day (TID) | ORAL | Status: DC | PRN

## 2018-11-02 NOTE — Plan of Care (Signed)
New admission.  Problem: Education: Goal: Knowledge of [facility] Education information/materials will improve Outcome: Not Progressing Goal: Emotional status will improve Outcome: Not Progressing Goal: Mental status will improve Outcome: Not Progressing Goal: Verbalization of understanding the information provided will improve Outcome: Not Progressing   Problem: Coping: Goal: Ability to verbalize frustrations and anger appropriately will improve Outcome: Not Progressing Goal: Ability to demonstrate self-control will improve Outcome: Not Progressing   Problem: Safety: Goal: Periods of time without injury will increase Outcome: Not Progressing   Problem: Health Behavior/Discharge Planning: Goal: Ability to identify changes in lifestyle to reduce recurrence of condition will improve Outcome: Not Progressing Goal: Identification of resources available to assist in meeting health care needs will improve Outcome: Not Progressing   Problem: Physical Regulation: Goal: Complications related to the disease process, condition or treatment will be avoided or minimized Outcome: Not Progressing   Problem: Self-Concept: Goal: Ability to identify factors that promote anxiety will improve Outcome: Not Progressing Goal: Level of anxiety will decrease Outcome: Not Progressing Goal: Ability to modify response to factors that promote anxiety will improve Outcome: Not Progressing

## 2018-11-02 NOTE — ED Notes (Signed)
Hourly rounding reveals patient sleeping in room. No complaints, stable, in no acute distress. Q15 minute rounds and monitoring via Security Cameras to continue. 

## 2018-11-02 NOTE — ED Notes (Signed)
Patient observed lying in bed with eyes closed  Even, unlabored respirations observed  NAD pt appears to be sleeping, will continue to monitor along with every 15 minute visual observations and ongoing security monitoring

## 2018-11-02 NOTE — Progress Notes (Signed)
D: Pt during assessments denies SI/HI/AVH, able to contract for safety. Pt. 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 Pt. Insight into her situation and problems appears good and she appears goal directed.   A: Q x 15 minute observation checks were completed for safety. Patient was provided with education. Patient was given/offered medications per orders. Patient  was encourage to attend groups, participate in unit activities and continue with plan of care. Pt. Chart and plans of care reviewed. Pt. Given support and encouragement.   R: Patient is complaint with medication and unit procedures this shift. Pt. Monitored per CIWA scoring while awake per MD orders. Pt. Pain being managed and addressed utilizing MD orders. Pt. covid screened. Pt. Attends group, good participation. Pt. Attends snack time, eating good.              Precautionary checks every 15 minutes for safety maintained, room free of safety hazards, patient sustains no injury or falls during this shift. Will endorse care to next shift.

## 2018-11-02 NOTE — ED Notes (Addendum)
Patient has been anxious due to being here and ready is ready to go home, she has been tearful, due to having to be admitted inpatient. Patient given Xanax 1mg 

## 2018-11-02 NOTE — Tx Team (Signed)
Initial Treatment Plan 11/02/2018 4:36 PM DACOTAH RIAL [id number]    PATIENT STRESSORS: Medication change or noncompliance Substance abuse   PATIENT STRENGTHS: General fund of knowledge Motivation for treatment/growth Supportive family/friends   PATIENT IDENTIFIED PROBLEMS: SI  Anxiety  Substance abuse                 DISCHARGE CRITERIA:  Ability to meet basic life and health needs Improved stabilization in mood, thinking, and/or behavior Reduction of life-threatening or endangering symptoms to within safe limits  PRELIMINARY DISCHARGE PLAN: Outpatient therapy Return to previous living arrangement  PATIENT/FAMILY INVOLVEMENT: This treatment plan has been presented to and reviewed with the patient, [name] Phillips.  The patient has been given the opportunity to ask questions and make suggestions.  Loida Calamia, RN 11/02/2018, 4:36 PM

## 2018-11-02 NOTE — Progress Notes (Signed)
Patient pleasant and cooperative during admission assessment. Patient denies SI/HI at this time. Patient denies AVH. Patient informed of fall risk status, fall risk assessed "low" at this time. Patient oriented to unit/staff/room. Patient denies any questions/concerns at this time. Patient safe on unit with Q15 minute checks for safety. Skin assessment and body search done.No contraband found. 

## 2018-11-02 NOTE — ED Notes (Signed)
Patient talking to Dr. Viviano Simas psychiatrist

## 2018-11-02 NOTE — ED Provider Notes (Signed)
-----------------------------------------   5:52 AM on 11/02/2018 -----------------------------------------   Blood pressure (!) 170/99, pulse 95, temperature 98.6 F (37 C), temperature source Oral, resp. rate 20, height 5\' 4"  (1.626 m), weight 71.7 kg, SpO2 100 %.  The patient is calm and cooperative at this time.  There have been no acute events since the last update.  Awaiting disposition plan from Behavioral Medicine team.   Irean Hong, MD 11/02/18 [phone]

## 2018-11-02 NOTE — ED Notes (Signed)
Pt. At door to nurses station c/o anxiety related to another patient yelling in the unit. Dr. Dolores Frame consulted and med order obtained.

## 2018-11-02 NOTE — BH Assessment (Addendum)
Patient is to be admitted to [facility] by Dr. Viviano Simas.  Attending Physician will be Dr. Toni Amend.   Patient has been assigned to room 305-A, by [facility] Charge Nurse Demetria.   Intake Paper Work has been signed and placed on patient chart.  ER staff is aware of the admission:  Misty Stanley, ER Secretary    Dr. Cyril Loosen, ER MD   Geralynn Ochs, Patient's Nurse   Dedra Skeens, Patient Access.

## 2018-11-02 NOTE — [facility] Group Notes (Signed)
[facility] Group Notes:  (Nursing/MHT/Case Management/Adjunct)  Date:  11/02/2018  Time:  8:29 PM  Type of Therapy:  Group Therapy  Participation Level:  Active  Participation Quality:  Appropriate and Attentive  Affect:  Appropriate  Cognitive:  Appropriate  Insight:  Good  Engagement in Group:  Engaged  Modes of Intervention:  Education  Summary of Progress/Problems:  [name] Phillips 11/02/2018, 8:29 PM

## 2018-11-02 NOTE — ED Notes (Signed)
Patient talking on the phone
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
## 2018-11-02 NOTE — ED Notes (Signed)
Pt brought back to the ED Rm 20 from the BHU. Pt reoriented to the area and is cooperative at this time. Pt does voice increasing anxiety and is asking for medication to help her anxiety. CIWA scale assessed and will give medication.

## 2018-11-02 NOTE — Plan of Care (Signed)
Pt. Is complaint with medications and unit procedures. Pt. Endorses a mostly normal mood. Pt. Denies si/hi/avh, able to contract for safety.    Problem: Education: Goal: Emotional status will improve Outcome: Progressing Goal: Mental status will improve Outcome: Progressing   Problem: Coping: Goal: Ability to demonstrate self-control will improve Outcome: Progressing   Problem: Safety: Goal: Periods of time without injury will increase Outcome: Progressing

## 2018-11-02 NOTE — ED Notes (Signed)
Hourly rounding reveals patient in room. Stable, in no acute distress. Q15 minute rounds and monitoring via Security Cameras to continue. 

## 2018-11-03 DIAGNOSIS — F4325 Adjustment disorder with mixed disturbance of emotions and conduct: Secondary | ICD-10-CM

## 2018-11-03 MED ORDER — MIRTAZAPINE 15 MG PO TABS: 30.0000 mg | ORAL_TABLET | Freq: Every day | ORAL | Status: DC

## 2018-11-03 NOTE — Progress Notes (Signed)
Dr. Viviano Simas notified of abnormal vital signs this morning. Orders received to give morning [facility] and recheck in one hour. Will continue to monitor for safety.

## 2018-11-03 NOTE — Progress Notes (Signed)
Patient denies SI/HI, denies A/V hallucinations. Patient verbalizes understanding of discharge instructions, follow up care. Patient given all belongings from [facility] locker. Patient escorted out by staff, transported by family. 

## 2018-11-03 NOTE — Progress Notes (Signed)
Recreation Therapy Notes  Date: 11/03/2018  Time: 9:30 am  Location: Craft Room  Behavioral response: Appropriate  Intervention Topic: Decision Making  Discussion/Intervention:  Group content today was focused on Decision making. The group defined decision making and some positive ways they make decisions for themselves. Individuals expressed reasons why they neglected any decision making in the past. Patients described ways to improve decision making skills in the future. The group explained what could happen if they did not do any decision making at all. Participants express how bad decision has affected them and others around them. Individual explained the importance of decision making. The group participated in the intervention "Making decisions" where they had a chance to discover some of their weaknesses and strengths in decision making. Patient came up with a new decision-making skill to improve themselves in the future.  Clinical Observations/Feedback:  Patient came to group late due to unknown reasons. Individual was social with peers and staff while participating in the intervention. Shanessa Hodak LRT/CTRS         Quadasia Newsham 11/03/2018 10:37 AM

## 2018-11-03 NOTE — H&P (Signed)
Psychiatric Admission Assessment Adult  Patient Identification: [name] HashimotoChristy A Phillips MRN:  [id number] Date of Evaluation:  11/03/2018 Chief Complaint:  MDD Principal Diagnosis: Adjustment disorder with mixed disturbance of emotions and conduct Diagnosis:  Principal Problem:   Adjustment disorder with mixed disturbance of emotions and conduct Active Problems:   Generalized anxiety disorder   Alcohol abuse  History of Present Illness: Patient seen and chart reviewed.  Patient was brought to the emergency room Sunday evening because her husband became concerned that the patient might be having thoughts of harming herself.  Patient was walking around the backyard holding a knife.  She had not actually done anything to hurt herself but was emotionally distressed.  Patient was intoxicated on presentation to the emergency room.  She was cooperative with evaluation and treatment.  On interview today the patient is able to tell me that she had had some stresses on Sunday.  She is finding her work very stressful especially in the current quarantine environment, she has chronic anxiety about her middle son who is estranged from the family and had received some news about him on Sunday that added to her distress.  Patient admits that it was unusual that she had been drinking as much as she was Sunday afternoon.  She had a blood alcohol level of 316 when she presented to the emergency room.  Patient now states that in the days leading up to this she had not necessarily been depressed although she relates that for about 2 or 3 weeks previously she had been more anxious and more irritable than usual.  Her primary care doctor had started her on bupropion for assistance with smoking.  The patient and family had noticed that it made the patient more irritable.  She had stopped the bupropion 3 days prior to presentation however.  She denies being aware of any actual desire to kill her self.  Today she is denying any depressive  symptoms at all.  Denies any suicidal thoughts.  Denies psychosis denies irritability.  Patient is on chronic mirtazapine and low-dose Xanax at night for anxiety.  Patient states that she had not misused any of her alprazolam. Associated Signs/Symptoms: Depression Symptoms:  anxiety, (Hypo) Manic Symptoms:  Distractibility, Impulsivity, Anxiety Symptoms:  Excessive Worry, Psychotic Symptoms:  None PTSD Symptoms: Negative Total Time spent with patient: 1 hour  Past Psychiatric History: Patient has no previous hospitalizations.  No previous suicide attempts.  He has not seen a psychiatrist or mental health provider for specific care.  She has been prescribed mirtazapine and low-dose alprazolam by her primary care doctor for anxiety.  She does not have any previous treatment for substance abuse or identified substance abuse problems in the past.  Is the patient at risk to self? No.  Has the patient been a risk to self in the past 6 months? No.  Has the patient been a risk to self within the distant past? No.  Is the patient a risk to others? No.  Has the patient been a risk to others in the past 6 months? No.  Has the patient been a risk to others within the distant past? No.   Prior Inpatient Therapy:   Prior Outpatient Therapy:    Alcohol Screening: 1. How often do you have a drink containing alcohol?: 4 or more times a week 2. How many drinks containing alcohol do you have on a typical day when you are drinking?: 3 or 4 3. How often do you have six or more  drinks on one occasion?: Monthly AUDIT-C Score: 7 4. How often during the last year have you found that you were not able to stop drinking once you had started?: Never 5. How often during the last year have you failed to do what was normally expected from you becasue of drinking?: Never 6. How often during the last year have you needed a first drink in the morning to get yourself going after a heavy drinking session?: Never 7. How  often during the last year have you had a feeling of guilt of remorse after drinking?: Less than monthly 8. How often during the last year have you been unable to remember what happened the night before because you had been drinking?: Never 9. Have you or someone else been injured as a result of your drinking?: No 10. Has a relative or friend or a doctor or another health worker been concerned about your drinking or suggested you cut down?: No Alcohol Use Disorder Identification Test Final Score (AUDIT): 8 Alcohol Brief Interventions/Follow-up: Alcohol Education, Continued Monitoring, Medication Offered/Prescribed Substance Abuse History in the last 12 months:  Yes.   Consequences of Substance Abuse: Medical Consequences:  Liver enzymes were elevated on presentation although she does not show any signs of actually having cirrhosis.  More to the point the patient's current mood symptoms resulting in hospitalization I think were largely the result of intoxication. Previous Psychotropic Medications: Yes  Psychological Evaluations: Yes  Past Medical History:  Past Medical History:  Diagnosis Date  . Hypertension    History reviewed. No pertinent surgical history. Family History:  Family History  Problem Relation Age of Onset  . Alcohol abuse Other   . Hyperlipidemia Mother   . Hypertension Mother   . COPD Father   . Heart disease Father   . Hypertension Father   . Cancer Maternal Aunt        breast   Family Psychiatric  History: Patient states that her father has PTSD and also might have had a diagnosis of schizophrenia. Tobacco Screening: Have you used any form of tobacco in the last 30 days? (Cigarettes, Smokeless Tobacco, Cigars, and/or Pipes): Yes Tobacco use, Select all that apply: 5 or more cigarettes per day Are you interested in Tobacco Cessation Medications?: Yes, will notify MD for an order Counseled patient on smoking cessation including recognizing danger situations,  developing coping skills and basic information about quitting provided: Yes Social History:  Social History   Substance and Sexual Activity  Alcohol Use Yes   Comment: social      Social History   Substance and Sexual Activity  Drug Use Never    Additional Social History:      History of alcohol / drug use?: Yes                    Allergies:   Allergies  Allergen Reactions  . Chantix [Varenicline Tartrate] Anxiety   Lab Results:  Results for orders placed or performed during the hospital encounter of 11/02/18 (from the past 48 hour(s))  Pregnancy, urine     Status: None   Collection Time: 11/02/18  7:05 PM  Result Value Ref Range   Preg Test, Ur NEGATIVE NEGATIVE    Comment: Performed at [facility], [address] [place], [place] [address]    Blood Alcohol level:  Lab Results  Component Value Date   ETH 316 [facility]) 11/01/2018    Metabolic Disorder Labs:  Lab Results  Component Value Date  HGBA1C 4.8 07/21/2012   No results found for: PROLACTIN Lab Results  Component Value Date   CHOL 220 (H) 09/22/2018   TRIG 270.0 (H) 09/22/2018   HDL 61.10 09/22/2018   CHOLHDL 4 09/22/2018   VLDL 54.0 (H) 09/22/2018   LDLCALC 101 (H) 02/06/2017   LDLCALC 101 (H) 02/01/2016    Current Medications: Current Facility-Administered Medications  Medication Dose Route Frequency Provider Last Rate Last Dose  . acetaminophen (TYLENOL) tablet 650 mg  650 mg Oral Q6H PRN Mariel Craft, MD      . ALPRAZolam Prudy Feeler) tablet 0.5 mg  0.5 mg Oral QHS Mariel Craft, MD   0.5 mg at 11/02/18 2103  . alum & mag hydroxide-simeth (MAALOX/MYLANTA) 200-200-20 MG/5ML suspension 30 mL  30 mL Oral Q4H PRN Mariel Craft, MD      . bisoprolol-hydrochlorothiazide [facility]) 2.5-6.25 MG per tablet 1 tablet  1 tablet Oral Daily Mariel Craft, MD   1 tablet at 11/03/18 [phone]  . chlordiazePOXIDE (LIBRIUM) capsule 25 mg  25 mg Oral Q6H PRN Mariel Craft, MD      . magnesium  hydroxide (MILK OF MAGNESIA) suspension 30 mL  30 mL Oral Daily PRN Mariel Craft, MD      . Melene Muller ON 11/04/2018] mirtazapine (REMERON) tablet 30 mg  30 mg Oral QHS Clapacs, John T, MD      . multivitamin with minerals tablet 1 tablet  1 tablet Oral Daily Mariel Craft, MD   1 tablet at 11/03/18 0801  . multivitamin with minerals tablet 1 tablet  1 tablet Oral Daily Mariel Craft, MD      . nicotine (NICODERM CQ - dosed in mg/24 hours) patch 21 mg  21 mg Transdermal Daily Mariel Craft, MD   21 mg at 11/03/18 0800  . norethindrone (MICRONOR) 0.35 MG tablet 0.35 mg  1 tablet Oral Daily Clapacs, Jackquline Denmark, MD   0.35 mg at 11/02/18 2103  . ondansetron (ZOFRAN-ODT) disintegrating tablet 4 mg  4 mg Oral Q6H PRN Mariel Craft, MD   4 mg at 11/03/18 0802  . thiamine (VITAMIN B-1) tablet 100 mg  100 mg Oral Daily Mariel Craft, MD       Or  . thiamine (B-1) injection 100 mg  100 mg Intravenous Daily Mariel Craft, MD      . thiamine (B-1) injection 100 mg  100 mg Intramuscular Once Mariel Craft, MD      . thiamine (VITAMIN B-1) tablet 100 mg  100 mg Oral Daily Mariel Craft, MD   100 mg at 11/03/18 0802   PTA Medications: Medications Prior to Admission  Medication Sig Dispense Refill Last Dose  . ALPRAZolam (XANAX) 0.5 MG tablet Take 1qd prn anxiety (Patient taking differently: Take 0.5 mg by mouth daily as needed for anxiety. ) 30 tablet 5 Past Week at Unknown time  . bisoprolol-hydrochlorothiazide (ZIAC) 2.5-6.25 MG tablet Take 1 tablet by mouth daily. 90 tablet 1 Past Week at Unknown time  . buPROPion (ZYBAN) 150 MG 12 hr tablet Take 1 tablet (150 mg total) by mouth 2 (two) times daily. 60 tablet 3 Past Week at Unknown time  . chlorpheniramine-HYDROcodone (TUSSIONEX PENNKINETIC ER) 10-8 MG/5ML SUER Take 5 mLs by mouth every 12 (twelve) hours as needed. (Patient taking differently: Take 5 mLs by mouth every 12 (twelve) hours as needed for cough. ) 140 mL 0 Past Week at Unknown  time  . gentamicin cream (GARAMYCIN)  0.1 % Apply 1 application topically 2 (two) times daily. 30 g 1 Past Week at Unknown time  . mirtazapine (REMERON) 15 MG tablet TAKE 1 TABLET BY MOUTH TWICE DAILY (Patient taking differently: Take 15 mg by mouth 2 (two) times daily. ) 180 tablet 0 Past Week at Unknown time  . Multiple Vitamin (MULTIVITAMIN) tablet Take 1 tablet by mouth daily.   Past Week at Unknown time  . norethindrone (MICRONOR,CAMILA,ERRIN) 0.35 MG tablet Take 1 tablet (0.35 mg total) by mouth daily. 3 Package 0 Past Week at Unknown time    Musculoskeletal: Strength & Muscle Tone: within normal limits Gait & Station: normal Patient leans: N/A  Psychiatric Specialty Exam: Physical Exam  Nursing note and vitals reviewed. Constitutional: She appears well-developed and well-nourished.  HENT:  Head: Normocephalic and atraumatic.  Eyes: Pupils are equal, round, and reactive to light. Conjunctivae are normal.  Neck: Normal range of motion.  Cardiovascular: Regular rhythm and normal heart sounds.  Respiratory: Effort normal. No respiratory distress.  GI: Soft.  Musculoskeletal: Normal range of motion.  Neurological: She is alert.  Skin: Skin is warm and dry.  Psychiatric: She has a normal mood and affect. Her speech is normal and behavior is normal. Judgment and thought content normal. Cognition and memory are normal.    Review of Systems  Constitutional: Negative.   HENT: Negative.   Eyes: Negative.   Respiratory: Negative.   Cardiovascular: Negative.   Gastrointestinal: Negative.   Musculoskeletal: Negative.   Skin: Negative.   Neurological: Negative.   Psychiatric/Behavioral: Negative.     Blood pressure (!) 151/99, pulse 85, temperature 99 F (37.2 C), temperature source Oral, resp. rate 17, height  (1.626 m), weight 68.5 kg, SpO2 100 %.Body mass index is 25.92 kg/m.  General Appearance: Fairly Groomed  Eye Contact:  Good  Speech:  Clear and Coherent  Volume:   Normal  Mood:  Euthymic  Affect:  Congruent  Thought Process:  Coherent  Orientation:  Full (Time, Place, and Person)  Thought Content:  Logical  Suicidal Thoughts:  No  Homicidal Thoughts:  No  Memory:  Immediate;   Fair Recent;   Fair Remote;   Fair  Judgement:  Fair  Insight:  Fair  Psychomotor Activity:  Normal  Concentration:  Concentration: Fair  Recall:  [employer] of Knowledge:  Fair  Language:  Fair  Akathisia:  No  Handed:  Right  AIMS (if indicated):     Assets:  Communication Skills Desire for Improvement Financial Resources/Insurance Housing Physical Health Resilience Social Support  ADL's:  Intact  Cognition:  WNL  Sleep:  Number of Hours: 7.5    Treatment Plan Summary: Plan Patient has been managed with 15-minute checks in full assessment by treatment team.  Although orders were provided for alcohol detox she has not required any medication and is not showing any signs of acute alcohol withdrawal.  Patient is now stable without acute symptoms of depression and with no suicidal ideation.  Plan is for discharge today with recommendations for outpatient therapy.  Observation Level/Precautions:  15 minute checks  Laboratory:  UDS  Psychotherapy:    Medications:    Consultations:    Discharge Concerns:    Estimated LOS:  Other:     Physician Treatment Plan for Primary Diagnosis: Adjustment disorder with mixed disturbance of emotions and conduct Long Term Goal(s): Improvement in symptoms so as ready for discharge  Short Term Goals: Ability to disclose and discuss suicidal ideas and Ability to  demonstrate self-control will improve  Physician Treatment Plan for Secondary Diagnosis: Principal Problem:   Adjustment disorder with mixed disturbance of emotions and conduct Active Problems:   Generalized anxiety disorder   Alcohol abuse  Long Term Goal(s): Improvement in symptoms so as ready for discharge  Short Term Goals: Ability to identify triggers  associated with substance abuse/mental health issues will improve  I certify that inpatient services furnished can reasonably be expected to improve the patient's condition.    Mordecai Rasmussen, MD 4/21/202010:29 AM

## 2018-11-03 NOTE — BHH Counselor (Signed)
Pt discharging within 24 hrs, PSA not required to be completed.

## 2018-11-03 NOTE — B[facility] Suicide Risk Assessment (Signed)
[facility]   Nursing information obtained from:  Patient Demographic factors:  Caucasian Current Mental Status:  NA Loss Factors:  NA Historical Factors:  NA Risk Reduction Factors:  Living with another person, especially a relative, Responsible for children under 40 years of age, Positive therapeutic relationship  Total Time spent with patient: 1 hour Principal Problem: <principal problem not specified> Diagnosis:  Active Problems:   MDD (major depressive disorder), recurrent severe, without psychosis (HCC)  Subjective Data: Patient seen and chart reviewed.  Patient was brought to the emergency room on Sunday evening because of concerns that she was having thoughts of self-harm.  On interview today the patient absolutely denies any thoughts of suicide or self-harm.  Denies current symptoms of depression.  Admits to having used excessive alcohol at the time.  Has improved insight about the whole situation.  Currently calm lucid and not showing any signs of acute risk to self.  Continued Clinical Symptoms:  Alcohol Use Disorder Identification Test Final Score (AUDIT): 8 The "Alcohol Use Disorders Identification Test", Guidelines for Use in Primary Care, Second Edition.  World Science writerHealth Organization [facility](WHO). Score between 0-7:  no or low risk or alcohol related problems. Score between 8-15:  moderate risk of alcohol related problems. Score between 16-19:  high risk of alcohol related problems. Score 20 or above:  warrants further diagnostic evaluation for alcohol dependence and treatment.   CLINICAL FACTORS:   Dysthymia Alcohol/Substance Abuse/Dependencies   Musculoskeletal: Strength & Muscle Tone: within normal limits Gait & Station: normal Patient leans: N/A  Psychiatric Specialty Exam: Physical Exam  Nursing note and vitals reviewed. Constitutional: She appears well-developed and well-nourished.  HENT:  Head: Normocephalic and atraumatic.  Eyes: Pupils  are equal, round, and reactive to light. Conjunctivae are normal.  Neck: Normal range of motion.  Cardiovascular: Regular rhythm and normal heart sounds.  Respiratory: Effort normal. No respiratory distress.  GI: Soft.  Musculoskeletal: Normal range of motion.  Neurological: She is alert.  Skin: Skin is warm and dry.  Psychiatric: She has a normal mood and affect. Her behavior is normal. Judgment and thought content normal.    Review of Systems  Constitutional: Negative.   HENT: Negative.   Eyes: Negative.   Respiratory: Negative.   Cardiovascular: Negative.   Gastrointestinal: Negative.   Musculoskeletal: Negative.   Skin: Negative.   Neurological: Negative.   Psychiatric/Behavioral: Negative.     Blood pressure (!) 151/99, pulse 85, temperature 99 F (37.2 C), temperature source Oral, resp. rate 17, height 5\' 4"  (1.626 m), weight 68.5 kg, SpO2 100 %.Body mass index is 25.92 kg/m.  General Appearance: Fairly Groomed  Eye Contact:  Good  Speech:  Clear and Coherent  Volume:  Normal  Mood:  Euthymic  Affect:  Congruent  Thought Process:  Goal Directed  Orientation:  Full (Time, Place, and Person)  Thought Content:  Logical  Suicidal Thoughts:  No  Homicidal Thoughts:  No  Memory:  Immediate;   Fair Recent;   Fair Remote;   Fair  Judgement:  Fair  Insight:  Fair  Psychomotor Activity:  Normal  Concentration:  Concentration: Fair  Recall:  [employer]Fair  Fund of Knowledge:  Fair  Language:  Fair  Akathisia:  No  Handed:  Right  AIMS (if indicated):     Assets:  Desire for Improvement Housing Physical Health Social Support  ADL's:  Intact  Cognition:  WNL  Sleep:  Number of Hours: 7.5      COGNITIVE FEATURES  THAT CONTRIBUTE TO RISK:  Thought constriction (tunnel vision)    SUICIDE RISK:   Minimal: No identifiable suicidal ideation.  Patients presenting with no risk factors but with morbid ruminations; may be classified as minimal risk based on the severity of the  depressive symptoms  PLAN OF CARE: Patient is being discharged from the hospital today.  No longer meets commitment criteria.  She has been counseled about alcohol abuse and mood symptoms and strongly encouraged to get herself involved in therapy.  She is agreeable to the plan.  No new prescriptions need to be written at this time.  I certify that inpatient services furnished can reasonably be expected to improve the patient's condition.   Mordecai Rasmussen, MD 11/03/2018, 10:23 AM

## 2018-11-03 NOTE — BHH Suicide Risk Assessment (Signed)
[facility] Discharge Suicide Risk Assessment   Principal Problem: <principal problem not specified> Discharge Diagnoses: Active Problems:   MDD (major depressive disorder), recurrent severe, without psychosis (HCC)   Total Time spent with patient: 1 hour  Musculoskeletal: Strength & Muscle Tone: within normal limits Gait & Station: normal Patient leans: N/A  Psychiatric Specialty Exam: Review of Systems  Constitutional: Negative.   HENT: Negative.   Eyes: Negative.   Respiratory: Negative.   Cardiovascular: Negative.   Gastrointestinal: Negative.   Musculoskeletal: Negative.   Skin: Negative.   Neurological: Negative.   Psychiatric/Behavioral: Negative.     Blood pressure (!) 151/99, pulse 85, temperature 99 F (37.2 C), temperature source Oral, resp. rate 17, height 5\' 4"  (1.626 m), weight 68.5 kg, SpO2 100 %.Body mass index is 25.92 kg/m.  General Appearance: Fairly Groomed  Patent attorney::  Good  Speech:  Clear and Coherent409  Volume:  Normal  Mood:  Euthymic  Affect:  Appropriate  Thought Process:  Goal Directed  Orientation:  Full (Time, Place, and Person)  Thought Content:  Logical  Suicidal Thoughts:  No  Homicidal Thoughts:  No  Memory:  Immediate;   Fair Recent;   Fair Remote;   Fair  Judgement:  Fair  Insight:  Fair  Psychomotor Activity:  Normal  Concentration:  Fair  Recall:  [employer] of Knowledge:Fair  Language: Fair  Akathisia:  No  Handed:  Right  AIMS (if indicated):     Assets:  Communication Skills Desire for Improvement Housing Physical Health Social Support  Sleep:  Number of Hours: 7.5  Cognition: WNL  ADL's:  Intact   Mental Status Per Nursing Assessment::   On Admission:  NA  Demographic Factors:  Caucasian  Loss Factors: Loss of significant relationship  Historical Factors: NA  Risk Reduction Factors:   Responsible for children under 26 years of age, Sense of responsibility to family, Religious beliefs about death,  Employed, Living with another person, especially a relative and Positive social support  Continued Clinical Symptoms:  Depression:   Comorbid alcohol abuse/dependence Alcohol/Substance Abuse/Dependencies  Cognitive Features That Contribute To Risk:  Thought constriction (tunnel vision)    Suicide Risk:  Minimal: No identifiable suicidal ideation.  Patients presenting with no risk factors but with morbid ruminations; may be classified as minimal risk based on the severity of the depressive symptoms    Plan Of Care/Follow-up recommendations:  Activity:  Activity as tolerated Diet:  Regular diet Other:  Follow-up with outpatient treatment in the community.  Recommendations will be provided by social work.  Psychoeducation counseling and information about alcohol and its effect on mood have been provided.  Patient does not currently meet commitment criteria and does not require further inpatient hospitalization.  Mordecai Rasmussen, MD 11/03/2018, 10:26 AM

## 2018-11-03 NOTE — BHH Counselor (Addendum)
CSW met with pt to discuss discharge plan. Pt reports she will utilize her EAP to locate a therapist, does not want a referral made for opt at this time. Pt reports she would like to be referred for medication management in or out of her insurance network and says she will pay any out of pocket expenses.  Pt states she does not want to be referred for sa counseling or residential treatment at this time. Pt says she will locate an AA group on her own.

## 2018-11-03 NOTE — BHH Suicide Risk Assessment (Signed)
BHH INPATIENT:  Family/Significant Other Suicide Prevention Education  Suicide Prevention Education:  Patient Refusal for Family/Significant Other Suicide Prevention Education: The patient [name] Phillips has refused to provide written consent for family/significant other to be provided Family/Significant Other Suicide Prevention Education during admission and/or prior to discharge.  Physician notified.  Gabrial Poppell T Ramello Cordial 11/03/2018, 10:34 AM

## 2018-11-03 NOTE — Progress Notes (Signed)
  [name] :  Will you be returning to the same living situation after discharge:  Yes,  lives with spouse At discharge, do you have transportation home?: Yes,  pt reports her spouse will provide transportation Do you have the ability to pay for your medications: Yes,  insurance  Release of information consent forms completed and in the chart;  Patient's signature needed at discharge.  Patient to Follow up at: Follow-up Information    [facility] Go on 11/04/2018.   Why:  Please follow up with [facility] on Wednesday, April 22nd at 2:30pm.  Please take your ID, insurance card and hospital discharge paperwork with you to your appointment.  Thank you. Contact information: [address] [place] [address] [phone]           Next level of care provider has access to [facility] Link:no  Safety Planning and Suicide Prevention discussed: Yes,  with pt; pt declined family contact  Have you used any form of tobacco in the last 30 days? (Cigarettes, Smokeless Tobacco, Cigars, and/or Pipes): Yes  Has patient been referred to the Quitline?: N/A patient is not a smoker  Patient has been referred for addiction treatment: N/A  Suzan Slick, LCSW 11/03/2018, 10:49 AM

## 2018-11-03 NOTE — Discharge Summary (Signed)
Physician Discharge Summary Note  Patient:  [name] Phillips is an 38 y.o., female MRN:  [id number] DOB:  [birth year] Patient phone:  [phone] Hashimoto45 [phone] (home)  Patient address:   [address] [place] [id number],  Total Time spent with patient: 45 minutes  Date of Admission:  11/02/2018 Date of Discharge: November 03, 2018  Reason for Admission: Admitted through the emergency room where she presented on Sunday with possible suicidal or self injury thoughts while intoxicated  Principal Problem: Adjustment disorder with mixed disturbance of emotions and conduct Discharge Diagnoses: Principal Problem:   Adjustment disorder with mixed disturbance of emotions and conduct Active Problems:   Generalized anxiety disorder   Alcohol abuse   Past Psychiatric History: Patient has a past history of anxiety but no previous suicide attempts no previous hospitalization and no formal psychiatric care except medication by her primary care doctor  Past Medical History:  Past Medical History:  Diagnosis Date  . Hypertension    History reviewed. No pertinent surgical history. Family History:  Family History  Problem Relation Age of Onset  . Alcohol abuse Other   . Hyperlipidemia Mother   . Hypertension Mother   . COPD Father   . Heart disease Father   . Hypertension Father   . Cancer Maternal Aunt        breast   Family Psychiatric  History: Father with PTSD and possibly schizophrenia Social History:  Social History   Substance and Sexual Activity  Alcohol Use Yes   Comment: social      Social History   Substance and Sexual Activity  Drug Use Never    Social History   Socioeconomic History  . Marital status: Married    Spouse name: Not on file  . Number of children: 2  . Years of education: Not on file  . Highest education level: Not on file  Occupational History  . Occupation: Geophysicist/field seismologisteridontal assistant    Employer: [employer]  Social Needs  . Financial resource strain: Not on  file  . Food insecurity:    Worry: Not on file    Inability: Not on file  . Transportation needs:    Medical: Not on file    Non-medical: Not on file  Tobacco Use  . Smoking status: Current Every Day Smoker    Packs/day: 1.00    Types: Cigarettes  . Smokeless tobacco: Never Used  Substance and Sexual Activity  . Alcohol use: Yes    Comment: social   . Drug use: Never  . Sexual activity: Yes    Birth control/protection: Pill  Lifestyle  . Physical activity:    Days per week: Not on file    Minutes per session: Not on file  . Stress: Not on file  Relationships  . Social connections:    Talks on phone: Not on file    Gets together: Not on file    Attends religious service: Not on file    Active member of club or organization: Not on file    Attends meetings of clubs or organizations: Not on file    Relationship status: Not on file  Other Topics Concern  . Not on file  Social History Narrative  . Not on file    Hospital Course: Patient admitted to the psychiatric ward.  15-minute checks in place.  Orders provided for as needed alcohol withdrawal.  Continued other outpatient psychiatric medicine.  Patient has not shown any dangerous or aggressive behavior.  She consistently denies suicidal  ideation.  She is not showing any symptoms or signs of alcohol withdrawal.  Patient has been counseled about the effect of alcohol use on mood and impulsivity.  She has been counseled about the negative interaction of Xanax with alcohol.  She has been strongly encouraged to cut back significantly on the amount of alcohol that she is drinking and if she is not able to do this to consider involvement in specific substance abuse treatment.  She is directed to continue being off of her Wellbutrin although she can continue taking her outpatient Remeron and Xanax as prescribed.  She will be given recommendations about psychotherapy available in the community.  Commitment discontinued.  Case reviewed with  treatment team.  Patient being discharged today.  Physical Findings: AIMS:  , ,  ,  ,    CIWA:  CIWA-Ar Total: 12 COWS:     Musculoskeletal: Strength & Muscle Tone: within normal limits Gait & Station: normal Patient leans: N/A  Psychiatric Specialty Exam: Physical Exam  Nursing note and vitals reviewed. Constitutional: She appears well-developed and well-nourished.  HENT:  Head: Normocephalic and atraumatic.  Eyes: Pupils are equal, round, and reactive to light. Conjunctivae are normal.  Neck: Normal range of motion.  Cardiovascular: Regular rhythm and normal heart sounds.  Respiratory: Effort normal. No respiratory distress.  GI: Soft.  Musculoskeletal: Normal range of motion.  Neurological: She is alert.  Skin: Skin is warm and dry.  Psychiatric: She has a normal mood and affect. Her behavior is normal. Judgment and thought content normal.    Review of Systems  Constitutional: Negative.   HENT: Negative.   Eyes: Negative.   Respiratory: Negative.   Cardiovascular: Negative.   Gastrointestinal: Negative.   Musculoskeletal: Negative.   Skin: Negative.   Neurological: Negative.   Psychiatric/Behavioral: Negative.     Blood pressure (!) 151/99, pulse 85, temperature 99 F (37.2 C), temperature source Oral, resp. rate 17, height 5\' 4"  (1.626 m), weight 68.5 kg, SpO2 100 %.Body mass index is 25.92 kg/m.  General Appearance: Fairly Groomed  Eye Contact:  Good  Speech:  Clear and Coherent  Volume:  Normal  Mood:  Euthymic  Affect:  Congruent  Thought Process:  Goal Directed  Orientation:  Full (Time, Place, and Person)  Thought Content:  Logical  Suicidal Thoughts:  No  Homicidal Thoughts:  No  Memory:  Immediate;   Fair Recent;   Fair Remote;   Fair  Judgement:  Fair  Insight:  Fair  Psychomotor Activity:  Normal  Concentration:  Concentration: Fair  Recall:  [employer] of Knowledge:  Fair  Language:  Fair  Akathisia:  No  Handed:  Right  AIMS (if  indicated):     Assets:  Desire for Improvement Financial Resources/Insurance Housing Physical Health Resilience Social Support  ADL's:  Intact  Cognition:  WNL  Sleep:  Number of Hours: 7.5     Have you used any form of tobacco in the last 30 days? (Cigarettes, Smokeless Tobacco, Cigars, and/or Pipes): Yes  Has this patient used any form of tobacco in the last 30 days? (Cigarettes, Smokeless Tobacco, Cigars, and/or Pipes) Yes, Yes, A prescription for an FDA-approved tobacco cessation medication was offered at discharge and the patient refused  Blood Alcohol level:  Lab Results  Component Value Date   ETH 316 (HH) 11/01/2018    Metabolic Disorder Labs:  Lab Results  Component Value Date   HGBA1C 4.8 07/21/2012   No results found for: PROLACTIN Lab Results  Component Value Date   CHOL 220 (H) 09/22/2018   TRIG 270.0 (H) 09/22/2018   HDL 61.10 09/22/2018   CHOLHDL 4 09/22/2018   VLDL 54.0 (H) 09/22/2018   LDLCALC 101 (H) 02/06/2017   LDLCALC 101 (H) 02/01/2016    See Psychiatric Specialty Exam and Suicide Risk Assessment completed by Attending Physician prior to discharge.  Discharge destination:  Home  Is patient on multiple antipsychotic therapies at discharge:  No   Has Patient had three or more failed trials of antipsychotic monotherapy by history:  No  Recommended Plan for Multiple Antipsychotic Therapies: NA  Discharge Instructions    Diet - low sodium heart healthy   Complete by:  As directed    Increase activity slowly   Complete by:  As directed      Allergies as of 11/03/2018      Reactions   Chantix [varenicline Tartrate] Anxiety      Medication List    STOP taking these medications   buPROPion 150 MG 12 hr tablet Commonly known as:  Zyban   chlorpheniramine-HYDROcodone 10-8 MG/5ML Suer Commonly known as:  Tussionex Pennkinetic ER   gentamicin cream 0.1 % Commonly known as:  GARAMYCIN   multivitamin tablet     TAKE these medications      Indication  ALPRAZolam 0.5 MG tablet Commonly known as:  XANAX Take 1qd prn anxiety What changed:    how much to take  how to take this  when to take this  reasons to take this  additional instructions  Indication:  Feeling Anxious   bisoprolol-hydrochlorothiazide 2.5-6.25 MG tablet Commonly known as:  ZIAC Take 1 tablet by mouth daily.  Indication:  High Blood Pressure Disorder   mirtazapine 15 MG tablet Commonly known as:  REMERON TAKE 1 TABLET BY MOUTH TWICE DAILY  Indication:  Panic Disorder   norethindrone 0.35 MG tablet Commonly known as:  MICRONOR Take 1 tablet (0.35 mg total) by mouth daily.  Indication:  Birth Control Treatment        Follow-up recommendations:  Activity:  Activity as tolerated Diet:  Regular diet Other:  Follow-up with outpatient treatment as recommended  Comments: See note above for details.  Do not continue Wellbutrin.  Make a real effort to cut back on alcohol abuse.  Psychoeducation particularly about alcohol use and its effect on mood and impulsivity has been completed.  Patient has been taken off of IVC and can be discharged today  Signed: Mordecai Rasmussen, MD 11/03/2018, 10:38 AM

## 2018-11-04 ENCOUNTER — Other Ambulatory Visit: Payer: Self-pay | Admitting: Family Medicine

## 2018-11-04 ENCOUNTER — Encounter: Payer: Self-pay | Admitting: Family Medicine

## 2018-11-04 DIAGNOSIS — Z09 Encounter for follow-up examination after completed treatment for conditions other than malignant neoplasm: Secondary | ICD-10-CM | POA: Insufficient documentation

## 2018-11-04 MED ORDER — ALPRAZOLAM 0.5 MG PO TABS: 0.5000 mg | ORAL_TABLET | Freq: Every day | ORAL | 0 refills | Status: DC | PRN

## 2018-11-04 NOTE — Progress Notes (Signed)
Virtual Visit via Video   I connected with Helen Hashimoto on 11/05/18 at 12:40 PM EDT by a video enabled telemedicine application and verified that I am speaking with the correct person using two identifiers. Location patient: Home Location provider: [facility], Office Persons participating in the virtual visit: Darcel Smalling, MD   I discussed the limitations of evaluation and management by telemedicine and the availability of in person appointments. The patient expressed understanding and agreed to proceed.  Subjective:   HPI:   Hospital follow up. Notes reviewed.  Presented to the ED on 11/01/18 for suicidal ideation since starting zyban for smoking cessation.  She was evaluated by psychiatry and admitted for alcohol withdrawal and for suicidal thoughts.  Discharge recommendations from Dr. Toni Amend were as follows:   Hospital Course: Patient admitted to the psychiatric ward.  15-minute checks in place.  Orders provided for as needed alcohol withdrawal.  Continued other outpatient psychiatric medicine.  Patient has not shown any dangerous or aggressive behavior.  She consistently denies suicidal ideation.  She is not showing any symptoms or signs of alcohol withdrawal.  Patient has been counseled about the effect of alcohol use on mood and impulsivity.  She has been counseled about the negative interaction of Xanax with alcohol.  She has been strongly encouraged to cut back significantly on the amount of alcohol that she is drinking and if she is not able to do this to consider involvement in specific substance abuse treatment.  She is directed to continue being off of her Wellbutrin although she can continue taking her outpatient Remeron and Xanax as prescribed.  She will be given recommendations about psychotherapy available in the community.  Commitment discontinued.  Case reviewed with treatment team.  Patient being discharged today.  She is concerned because she  won't be seeing a psychiatrist for another month and a half.  She called [facility] and waiting for a call back.  She has had no further suicidal thoughts.  Only taking Remeron and xanax currently.   Review of Systems  Constitutional: Negative.   Psychiatric/Behavioral: Positive for dysphoric mood. Negative for agitation, behavioral problems, confusion, decreased concentration, self-injury, sleep disturbance and suicidal ideas. The patient is nervous/anxious.   All other systems reviewed and are negative.   ROS: See pertinent positives and negatives per HPI.  Patient Active Problem List   Diagnosis Date Noted  . Hospital discharge follow-up 11/04/2018  . Adjustment disorder with mixed disturbance of emotions and conduct 11/03/2018  . Suicidal ideation 11/02/2018  . Alcohol abuse 11/02/2018  . MDD (major depressive disorder), recurrent severe, 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  . Fatigue 09/22/2018  . History of ovarian cyst 12/16/2017  . Premenstrual syndrome 07/28/2017  . HTN (hypertension) 05/03/2011  . Generalized anxiety disorder 04/05/2011  . CIGARETTE SMOKER 02/02/2007    Social History   Tobacco Use  . Smoking status: Current Every Day Smoker    Packs/day: 1.00    Types: Cigarettes  . Smokeless tobacco: Never Used  Substance Use Topics  . Alcohol use: Yes    Comment: social     Current Outpatient Medications:  .  ALPRAZolam (XANAX) 0.5 MG tablet, Take 1 tablet (0.5 mg total) by mouth daily as needed for anxiety., Disp: 30 tablet, Rfl: 0 .  bisoprolol-hydrochlorothiazide (ZIAC) 2.5-6.25 MG tablet, Take 1 tablet by mouth daily., Disp: 90 tablet, Rfl: 1 .  mirtazapine (REMERON) 15 MG tablet, TAKE 1 TABLET BY MOUTH TWICE DAILY (Patient taking differently: Take  15 mg by mouth 2 (two) times daily. ), Disp: 180 tablet, Rfl: 0 .  norethindrone (MICRONOR,CAMILA,ERRIN) 0.35 MG tablet, Take 1 tablet (0.35 mg total) by mouth daily., Disp: 3 Package, Rfl: 0  Allergies  Allergen  Reactions  . Wellbutrin [Bupropion]     Suicidal thoughts   . Chantix [Varenicline Tartrate] Anxiety    Objective:  There were no vitals taken for this visit.  VITALS: Per patient if applicable, see vitals. GENERAL: Alert, appears well and in no acute distress. HEENT: Atraumatic, conjunctiva clear, no obvious abnormalities on inspection of external nose and ears. NECK: Normal movements of the head and neck. CARDIOPULMONARY: No increased WOB. Speaking in clear sentences. I:E ratio WNL.  MS: Moves all visible extremities without noticeable abnormality. PSYCH: Pleasant and cooperative, well-groomed. Speech normal rate and rhythm. Affect is appropriate. Insight and judgement are appropriate. Attention is focused, linear, and appropriate.  NEURO: CN grossly intact. Oriented as arrived to appointment on time with no prompting. Moves both UE equally.  SKIN: No obvious lesions, wounds, erythema, or cyanosis noted on face or hands.  Assessment and Plan:   Neysa BonitoChristy was seen today for hospitalization follow-up.  Diagnoses and all orders for this visit:  Suicidal ideation  Hospital discharge follow-up    . Reviewed expectations re: course of current medical issues. . Discussed self-management of symptoms. . Outlined signs and symptoms indicating need for more acute intervention. . Patient verbalized understanding and all questions were answered. Marland Kitchen. Health Maintenance issues including appropriate healthy diet, exercise, and smoking avoidance were discussed with patient. . See orders for this visit as documented in the electronic medical record.  Ruthe Mannanalia Virlan Kempker, MD 11/05/2018

## 2018-11-05 ENCOUNTER — Encounter: Payer: Self-pay | Admitting: Family Medicine

## 2018-11-05 ENCOUNTER — Ambulatory Visit (INDEPENDENT_AMBULATORY_CARE_PROVIDER_SITE_OTHER): Payer: 59 | Admitting: Family Medicine

## 2018-11-05 VITALS — Ht 64.5 in | Wt 150.0 lb

## 2018-11-05 DIAGNOSIS — R45851 Suicidal ideations: Secondary | ICD-10-CM | POA: Diagnosis not present

## 2018-11-05 DIAGNOSIS — Z09 Encounter for follow-up examination after completed treatment for conditions other than malignant neoplasm: Secondary | ICD-10-CM

## 2018-11-05 MED ORDER — ALPRAZOLAM 0.5 MG PO TABS: 0.5000 mg | ORAL_TABLET | Freq: Every day | ORAL | 0 refills | Status: DC | PRN

## 2018-11-05 NOTE — Assessment & Plan Note (Signed)
>  40 minutes spent in face to face time with patient, >50% spent in counselling or coordination of care.  Since stopping wellbutrin, she is no longer having any suicidal thoughts and assures me she will seek medical care as she did when she was having them. At this time, she wants to only continue remeron and xanax.  Since is not wanting additional medication management, we discussed seeing a therapist prior to her psychiatry appointment which she was interested in doing.  I have her the phone number for [facility], asked that she ask for Dr. Spero Geralds and to let me know when she can get an appointment.  She also appropriately asked for a work note which I have sent to her via mychart. She will keep me updated.

## 2018-11-11 ENCOUNTER — Telehealth: Payer: Self-pay

## 2018-11-11 NOTE — Telephone Encounter (Signed)
Pt called in and stated that she is no longer needing the note faxed over, she stated that was was provided on mychart was good enough for her boss

## 2018-11-11 NOTE — Telephone Encounter (Signed)
Called pt Unable to leave pt VM need to see what the fax number to pt boss is so I can fax note over. Will try to call back.

## 2018-12-29 ENCOUNTER — Encounter: Payer: Self-pay | Admitting: Family Medicine

## 2018-12-30 ENCOUNTER — Telehealth: Payer: 59 | Admitting: Family Medicine

## 2019-01-12 ENCOUNTER — Other Ambulatory Visit: Payer: Self-pay | Admitting: Family Medicine

## 2019-01-21 ENCOUNTER — Encounter: Payer: Self-pay | Admitting: Family Medicine

## 2019-02-03 ENCOUNTER — Other Ambulatory Visit: Payer: Self-pay | Admitting: Family Medicine

## 2019-02-23 ENCOUNTER — Encounter: Payer: Self-pay | Admitting: Family Medicine

## 2019-02-24 ENCOUNTER — Other Ambulatory Visit: Payer: Self-pay | Admitting: Family Medicine

## 2019-02-24 MED ORDER — ALPRAZOLAM 0.5 MG PO TABS: 0.5000 mg | ORAL_TABLET | Freq: Three times a day (TID) | ORAL | 0 refills | Status: DC | PRN

## 2019-02-25 ENCOUNTER — Other Ambulatory Visit: Payer: Self-pay

## 2019-02-25 MED ORDER — NORETHINDRONE 0.35 MG PO TABS: 1.0000 | ORAL_TABLET | Freq: Every day | ORAL | 0 refills | Status: DC

## 2019-02-26 ENCOUNTER — Other Ambulatory Visit: Payer: Self-pay | Admitting: Family Medicine

## 2019-03-01 NOTE — Telephone Encounter (Signed)
TA-You sent this in on 8.12.20 but transmission failed/can you please resend?thx dmf

## 2019-03-17 ENCOUNTER — Other Ambulatory Visit: Payer: Self-pay | Admitting: Family Medicine

## 2019-03-30 ENCOUNTER — Other Ambulatory Visit: Payer: Self-pay | Admitting: Family Medicine

## 2019-03-30 ENCOUNTER — Encounter: Payer: Self-pay | Admitting: Family Medicine

## 2019-03-30 MED ORDER — GUAIFENESIN-CODEINE 100-10 MG/5ML PO SYRP: 5.0000 mL | ORAL_SOLUTION | Freq: Two times a day (BID) | ORAL | 0 refills | Status: DC | PRN

## 2019-03-30 MED ORDER — HYDROCOD POLST-CPM POLST ER 10-8 MG/5ML PO SUER: 5.0000 mL | Freq: Two times a day (BID) | ORAL | 0 refills | Status: DC | PRN

## 2019-04-27 ENCOUNTER — Other Ambulatory Visit: Payer: Self-pay | Admitting: Family Medicine

## 2019-05-02 DIAGNOSIS — Z1159 Encounter for screening for other viral diseases: Secondary | ICD-10-CM | POA: Diagnosis not present

## 2019-05-17 DIAGNOSIS — G5601 Carpal tunnel syndrome, right upper limb: Secondary | ICD-10-CM | POA: Diagnosis not present

## 2019-05-27 ENCOUNTER — Other Ambulatory Visit: Payer: Self-pay | Admitting: Family Medicine

## 2019-06-24 ENCOUNTER — Other Ambulatory Visit: Payer: Self-pay | Admitting: Family Medicine

## 2019-06-25 ENCOUNTER — Other Ambulatory Visit: Payer: Self-pay | Admitting: Family Medicine

## 2019-06-25 MED ORDER — HYDROCOD POLST-CPM POLST ER 10-8 MG/5ML PO SUER: 5.0000 mL | Freq: Two times a day (BID) | ORAL | 0 refills | Status: DC | PRN

## 2019-07-12 ENCOUNTER — Other Ambulatory Visit: Payer: Self-pay | Admitting: Family Medicine

## 2019-07-12 ENCOUNTER — Encounter: Payer: Self-pay | Admitting: Family Medicine

## 2019-07-12 MED ORDER — ALPRAZOLAM 0.5 MG PO TABS: 0.5000 mg | ORAL_TABLET | Freq: Two times a day (BID) | ORAL | 0 refills | Status: DC | PRN

## 2019-07-12 NOTE — Telephone Encounter (Signed)
Dr. Deborra Medina please also see mychart message from the pt explain why she request it sooner. Not sure if you change direction will help get it fill early since last time pt pick up on 06/25/2019.   Please advise

## 2019-07-12 NOTE — Telephone Encounter (Signed)
Dr. Deborra Medina please advise  Last ov for this was 11/05/2018 and last refill was 06/25/2019.  PMP checked last 3 refills from [facility] for 30 tab were: 06/25/19 05/28/19 04/27/19

## 2019-07-12 NOTE — Telephone Encounter (Signed)
Duplicate request, waiting for Dr.Aron to response.

## 2019-07-14 ENCOUNTER — Encounter: Payer: Self-pay | Admitting: Family Medicine

## 2019-08-18 ENCOUNTER — Other Ambulatory Visit: Payer: Self-pay | Admitting: Family Medicine

## 2019-08-19 ENCOUNTER — Other Ambulatory Visit: Payer: Self-pay | Admitting: Family Medicine

## 2019-08-19 NOTE — Telephone Encounter (Signed)
It won't let me refuse this/she has not been seen since April/thx dmf

## 2019-08-19 NOTE — Telephone Encounter (Signed)
TA-Plz see refill req/per [employer] PMP pt is compliant without red flags/noted that pt needs visit for future fills/thx dmf

## 2019-10-29 ENCOUNTER — Other Ambulatory Visit: Payer: Self-pay

## 2019-10-29 MED ORDER — MIRTAZAPINE 15 MG PO TABS: ORAL_TABLET | ORAL | 0 refills | Status: DC

## 2019-10-29 MED ORDER — NORETHINDRONE 0.35 MG PO TABS: 1.0000 | ORAL_TABLET | Freq: Every day | ORAL | 0 refills | Status: DC

## 2019-11-26 ENCOUNTER — Encounter: Payer: Self-pay | Admitting: Family Medicine

## 2019-11-26 ENCOUNTER — Ambulatory Visit (INDEPENDENT_AMBULATORY_CARE_PROVIDER_SITE_OTHER): Payer: BC Managed Care – PPO | Admitting: Family Medicine

## 2019-11-26 ENCOUNTER — Other Ambulatory Visit: Payer: Self-pay

## 2019-11-26 VITALS — BP 158/102 | HR 82 | Temp 98.0°F | Ht 65.0 in | Wt 155.8 lb

## 2019-11-26 DIAGNOSIS — F411 Generalized anxiety disorder: Secondary | ICD-10-CM | POA: Diagnosis not present

## 2019-11-26 DIAGNOSIS — I10 Essential (primary) hypertension: Secondary | ICD-10-CM | POA: Diagnosis not present

## 2019-11-26 DIAGNOSIS — Z7689 Persons encountering health services in other specified circumstances: Secondary | ICD-10-CM | POA: Diagnosis not present

## 2019-11-26 DIAGNOSIS — Z1159 Encounter for screening for other viral diseases: Secondary | ICD-10-CM | POA: Diagnosis not present

## 2019-11-26 DIAGNOSIS — Z114 Encounter for screening for human immunodeficiency virus [HIV]: Secondary | ICD-10-CM

## 2019-11-26 DIAGNOSIS — E7800 Pure hypercholesterolemia, unspecified: Secondary | ICD-10-CM | POA: Diagnosis not present

## 2019-11-26 LAB — CBC WITH DIFFERENTIAL/PLATELET
Basophils Absolute: 0 10*3/uL (ref 0.0–0.1)
Basophils Relative: 0.6 % (ref 0.0–3.0)
Eosinophils Absolute: 0 10*3/uL (ref 0.0–0.7)
Eosinophils Relative: 0.8 % (ref 0.0–5.0)
HCT: 38.8 % (ref 36.0–46.0)
Hemoglobin: 13.4 g/dL (ref 12.0–15.0)
Lymphocytes Relative: 27.9 % (ref 12.0–46.0)
Lymphs Abs: 1.7 10*3/uL (ref 0.7–4.0)
MCHC: 34.5 g/dL (ref 30.0–36.0)
MCV: 106.2 fl — ABNORMAL HIGH (ref 78.0–100.0)
Monocytes Absolute: 0.8 10*3/uL (ref 0.1–1.0)
Monocytes Relative: 13.2 % — ABNORMAL HIGH (ref 3.0–12.0)
Neutro Abs: 3.4 10*3/uL (ref 1.4–7.7)
Neutrophils Relative %: 57.5 % (ref 43.0–77.0)
Platelets: 280 10*3/uL (ref 150.0–400.0)
RBC: 3.65 Mil/uL — ABNORMAL LOW (ref 3.87–5.11)
RDW: 13.3 % (ref 11.5–15.5)
WBC: 5.9 10*3/uL (ref 4.0–10.5)

## 2019-11-26 LAB — COMPREHENSIVE METABOLIC PANEL
ALT: 44 U/L — ABNORMAL HIGH (ref 0–35)
AST: 38 U/L — ABNORMAL HIGH (ref 0–37)
Albumin: 4.5 g/dL (ref 3.5–5.2)
Alkaline Phosphatase: 178 U/L — ABNORMAL HIGH (ref 39–117)
BUN: 15 mg/dL (ref 6–23)
CO2: 28 mEq/L (ref 19–32)
Calcium: 9.4 mg/dL (ref 8.4–10.5)
Chloride: 102 mEq/L (ref 96–112)
Creatinine, Ser: 0.76 mg/dL (ref 0.40–1.20)
GFR: 83.95 mL/min (ref >60.00)
Glucose, Bld: 112 mg/dL — ABNORMAL HIGH (ref 70–99)
Potassium: 3.9 mEq/L (ref 3.5–5.1)
Sodium: 137 mEq/L (ref 135–145)
Total Bilirubin: 0.7 mg/dL (ref 0.2–1.2)
Total Protein: 7.1 g/dL (ref 6.0–8.3)

## 2019-11-26 LAB — LIPID PANEL
Cholesterol: 217 mg/dL — ABNORMAL HIGH (ref 0–200)
HDL: 75.7 mg/dL (ref >39.00)
NonHDL: 141.12
Total CHOL/HDL Ratio: 3
Triglycerides: 233 mg/dL — ABNORMAL HIGH (ref 0.0–149.0)
VLDL: 46.6 mg/dL — ABNORMAL HIGH (ref 0.0–40.0)

## 2019-11-26 LAB — LDL CHOLESTEROL, DIRECT: Direct LDL: 90 mg/dL

## 2019-11-26 MED ORDER — BISOPROLOL-HYDROCHLOROTHIAZIDE 5-6.25 MG PO TABS: 1.0000 | ORAL_TABLET | Freq: Every day | ORAL | 1 refills | Status: DC

## 2019-11-26 MED ORDER — ALPRAZOLAM 0.5 MG PO TABS: 0.5000 mg | ORAL_TABLET | Freq: Two times a day (BID) | ORAL | 1 refills | Status: DC | PRN

## 2019-11-26 MED ORDER — HYDROCOD POLST-CPM POLST ER 10-8 MG/5ML PO SUER: 5.0000 mL | Freq: Two times a day (BID) | ORAL | 0 refills | Status: DC | PRN

## 2019-11-26 MED ORDER — NORETHINDRONE 0.35 MG PO TABS: 1.0000 | ORAL_TABLET | Freq: Every day | ORAL | 4 refills | Status: DC

## 2019-11-26 MED ORDER — SERTRALINE HCL 50 MG PO TABS: 50.0000 mg | ORAL_TABLET | Freq: Every day | ORAL | 3 refills | Status: DC

## 2019-11-26 NOTE — Progress Notes (Signed)
Subjective:    Patient ID: [name] Phillips, female    DOB: [birth year], 39 y.o.   MRN: [id number]  HPI  Chief Complaint  Patient presents with  . New Patient (Initial Visit)    Estab care   This is a 39 yo old female who presents today to establish care. Married with 3 grown kids. Enjoys camping, outdoors. 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  Visit saw Dr. Clifton Custard.  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 Gave blood recently and was told she was positive.   Chronic cough- poor sleep, tussionex. Worse coughing at night. Stopped smoking a week ago. Using e cigarette.   Progesterone only OCP- no menses  Anxiety- alprazolam at night daily and sometimes during day. Willing to go on something different, daily.   GERD-has had upper epigastric pain which has been improved with omeprazole 20 mg over-the-counter.  She is not sure of any particular triggers.  Hypertension-she has been out of her medication for a while.  She borrowed some for her mother but has not been taking daily.  As of today, she has not had her medication for at least 3 days.   Review of Systems Per HPI    Objective:   Physical Exam Vitals reviewed.  Constitutional:      General: She is not in acute distress.    Appearance: Normal appearance. She is normal weight. She is not ill-appearing, toxic-appearing or diaphoretic.  HENT:     Head: Normocephalic and atraumatic.  Eyes:     Conjunctiva/sclera: Conjunctivae normal.  Cardiovascular:     Pulses: Normal pulses.     Heart sounds: Normal heart sounds.  Pulmonary:     Effort: Pulmonary effort is normal.     Breath sounds: Normal breath sounds.  Musculoskeletal:     Right lower leg: No edema.     Left lower leg: No edema.  Skin:    General: Skin is warm and dry.  Neurological:      Mental Status: She is alert and oriented to person, place, and time.  Psychiatric:        Attention and Perception: Attention normal.        Mood and Affect: Mood is anxious.        Speech: Speech normal.        Behavior: Behavior normal.        Thought Content: Thought content normal.        Cognition and Memory: Cognition normal.        Judgment: Judgment normal.       BP (!) 178/110 (BP Location: Left Arm, Patient Position: Sitting, Cuff Size: Normal) Comment: BP always up in Dr office  Pulse 82   Temp 98 F (36.7 C) (Temporal)   Ht 5\' 5"  (1.651 m)   Wt 155 lb 12.8 oz (70.7 kg)   SpO2 99%   BMI 25.93 kg/m  Wt Readings from Last 3 Encounters:  11/26/19 155 lb 12.8 oz (70.7 kg)  11/05/18 150 lb (68 kg)  11/01/18 158 lb (71.7 kg)   BP Readings from Last 3 Encounters:  11/26/19 (!) 178/110  11/02/18 (!) 146/96  09/22/18 140/90   BP: (!) 158/102 . Depression screen [facility] 2/9 11/26/2019 09/22/2018 02/06/2017  Decreased Interest 0 1 1  Down, Depressed, Hopeless 0 1 1  PHQ - 2 Score 0 2 2  Altered sleeping - 1 0  Tired, decreased energy - 1 0  Change in appetite - 1 0  Feeling bad or failure about yourself  - 0 0  Trouble concentrating - 0 0  Moving slowly or fidgety/restless - 0 0  Suicidal thoughts - 0 0  PHQ-9 Score - 5 2  Difficult doing work/chores - - Not difficult at all          Assessment & Plan:  1. Encounter to establish care -Reviewed EMR and discussed need for follow-up Pap  2. Generalized anxiety disorder -Discussed use of daily medication including expectations of treatment -Follow-up in 6 to 8 weeks, will do Pap at that time - sertraline (ZOLOFT) 50 MG tablet; Take 1 tablet (50 mg total) by mouth daily.  Dispense: 30 tablet; Refill: 3  3. Essential hypertension -Has not been taking medication, discussed importance of compliance, new prescription sent into patient's pharmacy for increased bisoprolol portion of prescription - Comprehensive  metabolic panel - Lipid Panel - CBC with Differential - bisoprolol-hydrochlorothiazide (ZIAC) 5-6.25 MG tablet; Take 1 tablet by mouth daily.  Dispense: 90 tablet; Refill: 1 -She will check her blood pressures at home and send me readings, parameters provided  4. Encounter for hepatitis C screening test for low risk patient - Hepatitis C antibody  5. Screening for HIV (human immunodeficiency virus) - HIV Antibody (routine testing w rflx)  6. Elevated cholesterol - Comprehensive metabolic panel - Lipid Panel - CBC with Differential  This visit occurred during the SARS-CoV-2 public health emergency.  Safety protocols were in place, including screening questions prior to the visit, additional usage of staff PPE, and extensive cleaning of exam room while observing appropriate contact time as indicated for disinfecting solutions.    Clarene Reamer, FNP-BC  [facility], [facility]  11/27/2019 8:48 AM

## 2019-11-26 NOTE — Patient Instructions (Signed)
Check your blood pressure 2-3 times a weeks and keep a log. Goal is less than 150/90.   I have sent in an anti- anxiety medication, start 1/2 tablet every night for 3-5 nights, if tolerating, start full tablet  I will notify you of lab results

## 2019-12-01 ENCOUNTER — Encounter: Payer: Self-pay | Admitting: Family Medicine

## 2019-12-01 DIAGNOSIS — R768 Other specified abnormal immunological findings in serum: Secondary | ICD-10-CM | POA: Insufficient documentation

## 2019-12-01 LAB — HCV RNA,QUANTITATIVE REAL TIME PCR
HCV Quantitative Log: <1.18 Log IU/mL
HCV RNA, PCR, QN: <15 IU/mL

## 2019-12-01 LAB — HEPATITIS C ANTIBODY
Hepatitis C Ab: REACTIVE — AB
SIGNAL TO CUT-OFF: 1.51 — ABNORMAL HIGH (ref <1.00)

## 2019-12-01 LAB — HIV ANTIBODY (ROUTINE TESTING W REFLEX): HIV 1&2 Ab, 4th Generation: NONREACTIVE

## 2019-12-14 DIAGNOSIS — R300 Dysuria: Secondary | ICD-10-CM | POA: Diagnosis not present

## 2019-12-21 ENCOUNTER — Other Ambulatory Visit: Payer: Self-pay | Admitting: Family Medicine

## 2020-01-24 ENCOUNTER — Other Ambulatory Visit: Payer: Self-pay | Admitting: Family Medicine

## 2020-01-24 NOTE — Telephone Encounter (Signed)
Last OV 11/26/19 Last refill 11/26/19 #60 x 1 refill Upcoming OV 01/28/20  Please advise, thanks.

## 2020-01-28 ENCOUNTER — Ambulatory Visit (INDEPENDENT_AMBULATORY_CARE_PROVIDER_SITE_OTHER): Payer: BC Managed Care – PPO | Admitting: Family Medicine

## 2020-01-28 ENCOUNTER — Other Ambulatory Visit (HOSPITAL_COMMUNITY)
Admission: RE | Admit: 2020-01-28 | Discharge: 2020-01-28 | Disposition: A | Discharge status: Home / Self Care | Payer: BC Managed Care – PPO | Source: Ambulatory Visit | Attending: Family Medicine | Admitting: Family Medicine

## 2020-01-28 ENCOUNTER — Other Ambulatory Visit: Payer: Self-pay

## 2020-01-28 ENCOUNTER — Encounter: Payer: Self-pay | Admitting: Family Medicine

## 2020-01-28 VITALS — BP 120/70 | HR 88 | Temp 97.8°F | Ht 65.0 in | Wt 164.0 lb

## 2020-01-28 DIAGNOSIS — Z124 Encounter for screening for malignant neoplasm of cervix: Secondary | ICD-10-CM | POA: Insufficient documentation

## 2020-01-28 DIAGNOSIS — R7989 Other specified abnormal findings of blood chemistry: Secondary | ICD-10-CM

## 2020-01-28 DIAGNOSIS — I10 Essential (primary) hypertension: Secondary | ICD-10-CM | POA: Diagnosis not present

## 2020-01-28 DIAGNOSIS — F411 Generalized anxiety disorder: Secondary | ICD-10-CM

## 2020-01-28 DIAGNOSIS — E663 Overweight: Secondary | ICD-10-CM

## 2020-01-28 DIAGNOSIS — Z0000 Encounter for general adult medical examination without abnormal findings: Secondary | ICD-10-CM | POA: Diagnosis not present

## 2020-01-28 DIAGNOSIS — Z6827 Body mass index (BMI) 27.0-27.9, adult: Secondary | ICD-10-CM

## 2020-01-28 DIAGNOSIS — M7989 Other specified soft tissue disorders: Secondary | ICD-10-CM

## 2020-01-28 NOTE — Patient Instructions (Signed)
Good to see you today  For leg swelling- continue good water intake, reduce salt in diet, elevate feet when sitting, let me know if not better in 2 weeks  Follow up in 6 months  A resource that I like is www.dietdoctor.com/diabetes/diet Youtube videos- Dr. Wylene Simmer, Dr. Shanda Howells  Here are some guidelines to help you with meal planning -  Avoid all processed and packaged foods (bread, pasta, crackers, chips, etc) and beverages containing calories.  Avoid added sugars and excessive natural sugars.  Attention to how you feel if you consume artificial sweeteners.  Do they make you more hungry or raise your blood sugar?  With every meal and snack, aim to get 20 g of protein (3 ounces of meat, 4 ounces of fish, 3 eggs, protein powder, 1 cup [place] yogurt, 1 cup cottage cheese, etc.)  Increase fiber in the form of non-starchy vegetables.  These help you feel full with very little carbohydrates and are good for gut health.  Eat 1 serving healthy carb per meal- 1/2 cup brown rice, beans, potato, corn- pay attention to whether or not this significantly raises your blood sugar. If it does, reduce the frequency you consume these.   Eat 2-3 servings of lower sugar fruits daily.  This includes berries, apples, oranges, peaches, pears, one half banana.  Have small amounts of good fats such as avocado, nuts, olive oil, nut butters, olives.  Add a little cheese to your salads to make them tasty.

## 2020-01-28 NOTE — Progress Notes (Signed)
Subjective:    Patient ID: [name] Phillips, female    DOB: 07/11/1981, 39 y.o.   MRN: [id number]  HPI Chief Complaint  Patient presents with   Annual Exam    ankle swelling, bilateral - denies SOB. Hand swell at times as well / Fatigue / snoring   This is a 39 yo female who presents today for annual exam.     Last CPE- 3/20 Mammo- overdue, tried to schedule, had problems with insurance, is getting fixed in system Pap- 3/20- positive hpv, repeat today Tdap-11/26/2014 Flu- annual Eye- 2 years ago Dental- regular Exercise- walking 3x/ week Diet- sweetened Greek yogurt, fruit, Lunch- fruit or out, Dinner- variable or skips ETOH- 3 beers 3x/ week  Mood- doing better on sertraline 50 mg, does not think she needs to go up at this time.  Leg swelling- at end of day. Currently bisoprolol- HCTZ 5-6.25. Eats high salt diet, canned foods, fast foods.        Review of Systems  Constitutional: Negative.   HENT: Negative.   Eyes: Negative.   Respiratory: Negative.   Cardiovascular: Positive for leg swelling (end of day).  Gastrointestinal: Negative.   Endocrine: Negative.   Genitourinary: Negative.   Musculoskeletal: Negative.   Skin: Negative.   Allergic/Immunologic: Negative.   Neurological: Negative.   Psychiatric/Behavioral: Positive for sleep disturbance (some snoring, worse with weight gain). The patient is nervous/anxious.        Objective:   Physical Exam Exam conducted with a chaperone present Lincoln Maxin, CMA).  Constitutional:      General: She is not in acute distress.    Appearance: Normal appearance. She is normal weight. She is not ill-appearing, toxic-appearing or diaphoretic.  HENT:     Head: Normocephalic and atraumatic.     Right Ear: Tympanic membrane, ear canal and external ear normal.     Left Ear: Tympanic membrane, ear canal and external ear normal.     Nose: Nose normal.     Mouth/Throat:     Mouth: Mucous membranes are moist.      Pharynx: Oropharynx is clear.  Eyes:     Conjunctiva/sclera: Conjunctivae normal.  Cardiovascular:     Rate and Rhythm: Normal rate and regular rhythm.     Pulses: Normal pulses.     Heart sounds: Normal heart sounds.  Pulmonary:     Effort: Pulmonary effort is normal.     Breath sounds: Normal breath sounds.  Chest:     Breasts:        Right: Normal.        Left: Normal.  Abdominal:     General: Abdomen is flat. Bowel sounds are normal. There is no distension.     Palpations: Abdomen is soft. There is no mass.     Tenderness: There is no abdominal tenderness. There is no guarding or rebound.     Hernia: No hernia is present.  Genitourinary:    General: Normal vulva.  Musculoskeletal:     Cervical back: Normal range of motion and neck supple.     Right lower leg: No edema.     Left lower leg: No edema.  Skin:    General: Skin is warm and dry.  Neurological:     Mental Status: She is alert and oriented to person, place, and time.  Psychiatric:        Mood and Affect: Mood normal.        Behavior: Behavior normal.  Thought Content: Thought content normal.        Judgment: Judgment normal.       BP 120/70 (BP Location: Left Arm, Patient Position: Sitting, Cuff Size: Normal)    Pulse 88    Temp 97.8 F (36.6 C) (Temporal)    Ht 5\' 5"  (1.651 m)    Wt 164 lb (74.4 kg)    SpO2 98%    BMI 27.29 kg/m  Wt Readings from Last 3 Encounters:  01/28/20 164 lb (74.4 kg)  11/26/19 155 lb 12.8 oz (70.7 kg)  11/05/18 150 lb (68 kg)   BP Readings from Last 3 Encounters:  01/28/20 120/70  11/26/19 (!) 158/102  11/02/18 (!) 146/96    Depression screen PHQ 2/9 01/28/2020 11/26/2019 09/22/2018 02/06/2017  Decreased Interest 0 0 1 1  Down, Depressed, Hopeless 0 0 1 1  PHQ - 2 Score 0 0 2 2  Altered sleeping - - 1 0  Tired, decreased energy - - 1 0  Change in appetite - - 1 0  Feeling bad or failure about yourself  - - 0 0  Trouble concentrating - - 0 0  Moving slowly or  fidgety/restless - - 0 0  Suicidal thoughts - - 0 0  PHQ-9 Score - - 5 2  Difficult doing work/chores - - - Not difficult at all       Assessment & Plan:  1. Annual physical exam - Discussed and encouraged healthy lifestyle choices- adequate sleep, regular exercise, stress management and healthy food choices.    2. Screening for cervical cancer - Cytology - PAP([place])  3. Essential hypertension - improved back on her medication   4. Generalized anxiety disorder - improved with sertraline 50 mg, discussed going up on dose, she is ok at current dose, will be in touch if no improvement  5. Leg swelling - encouraged her to decrease processed/ packaged foods, elevate legs, she will let me know if not improved  6. Overweight with body mass index (BMI) of 27 to 27.9 in adult - provided written and verbal information regarding healthy food choices  7. Elevated LFTs - discussed alcohol and encouraged her to reduce to no more than 1 drink daily - will recheck at follow up  This visit occurred during the SARS-CoV-2 public health emergency.  Safety protocols were in place, including screening questions prior to the visit, additional usage of staff PPE, and extensive cleaning of exam room while observing appropriate contact time as indicated for disinfecting solutions.      02/08/2017, FNP-BC  [facility], [facility]  01/28/2020 10:11 AM

## 2020-02-01 LAB — CYTOLOGY - PAP
Adequacy: ABSENT
Comment: NEGATIVE
Diagnosis: NEGATIVE
High risk HPV: NEGATIVE

## 2020-03-23 ENCOUNTER — Other Ambulatory Visit: Payer: Self-pay | Admitting: Family Medicine

## 2020-03-24 NOTE — Telephone Encounter (Signed)
Called and spoke to [facility].  She reports that patient has not had medication filled since 11/16/2019.  Please call patient and verify how she is taking mirtazapine?  It is currently not on her medication list.

## 2020-03-24 NOTE — Telephone Encounter (Signed)
Last office visit 01/28/2020. Upcoming office visit 07/2020.  Medication currently not on pt's list.

## 2020-03-27 NOTE — Telephone Encounter (Signed)
Called pt back and left message on vmail to call office and ask to speak to Clydie Braun.

## 2020-03-27 NOTE — Telephone Encounter (Signed)
Pt returned your call  best number [phone]

## 2020-03-27 NOTE — Telephone Encounter (Signed)
Called and left message on pt's vmail to call the office and ask to speak to Clydie Braun.

## 2020-03-27 NOTE — Telephone Encounter (Signed)
Called pt and she stated that have been taking mirtazapine for years.  Stated that Dr Dayton Martes formerly of [employer] prescribed medication.  She stated she takes it every night.

## 2020-04-03 ENCOUNTER — Other Ambulatory Visit: Payer: Self-pay | Admitting: Family Medicine

## 2020-04-03 DIAGNOSIS — F411 Generalized anxiety disorder: Secondary | ICD-10-CM

## 2020-04-11 ENCOUNTER — Encounter: Payer: Self-pay | Admitting: Family Medicine

## 2020-04-12 ENCOUNTER — Other Ambulatory Visit: Payer: Self-pay | Admitting: Family Medicine

## 2020-04-13 ENCOUNTER — Other Ambulatory Visit: Payer: Self-pay | Admitting: Family Medicine

## 2020-04-13 MED ORDER — HYDROCOD POLST-CPM POLST ER 10-8 MG/5ML PO SUER
5.0000 mL | Freq: Two times a day (BID) | ORAL | 0 refills | Status: DC | PRN
Start: 2020-04-13 — End: 2020-06-06

## 2020-05-03 ENCOUNTER — Other Ambulatory Visit: Payer: Self-pay | Admitting: Family Medicine

## 2020-05-26 ENCOUNTER — Other Ambulatory Visit: Payer: Self-pay | Admitting: Family Medicine

## 2020-05-26 DIAGNOSIS — I10 Essential (primary) hypertension: Secondary | ICD-10-CM

## 2020-05-26 NOTE — Telephone Encounter (Signed)
Pharmacy requests refill on: Alprazolam 0.5 mg  LAST REFILL: 01/24/2020 LAST OV: 01/28/2020 NEXT OV: 08/04/2020 PHARMACY: [facility] [place], [place]  Pharmacy requests refill on: Bisoprolol-HCTZ 5-6.25 mg   LAST REFILL: 11/26/2019 LAST OV: 01/28/2020 NEXT OV: 08/04/2020 PHARMACY: [facility] [place], [place]

## 2020-06-06 ENCOUNTER — Other Ambulatory Visit: Payer: Self-pay | Admitting: Family Medicine

## 2020-06-06 ENCOUNTER — Encounter: Payer: Self-pay | Admitting: Family Medicine

## 2020-06-06 MED ORDER — HYDROCOD POLST-CPM POLST ER 10-8 MG/5ML PO SUER
5.0000 mL | Freq: Two times a day (BID) | ORAL | 0 refills | Status: DC | PRN
Start: 2020-06-06 — End: 2020-10-18

## 2020-06-06 NOTE — Telephone Encounter (Signed)
Last office visit 01/28/2020 for CPE.  Last refilled 04/13/2020 for 140 ml with no refills. Next Appt: 08/04/2020 for 6 month follow up.

## 2020-06-30 ENCOUNTER — Other Ambulatory Visit: Payer: Self-pay | Admitting: Family Medicine

## 2020-06-30 ENCOUNTER — Encounter: Payer: Self-pay | Admitting: Family Medicine

## 2020-06-30 DIAGNOSIS — F411 Generalized anxiety disorder: Secondary | ICD-10-CM

## 2020-06-30 DIAGNOSIS — I10 Essential (primary) hypertension: Secondary | ICD-10-CM

## 2020-07-02 ENCOUNTER — Encounter: Payer: Self-pay | Admitting: Family Medicine

## 2020-07-03 ENCOUNTER — Other Ambulatory Visit: Payer: Self-pay | Admitting: Family Medicine

## 2020-07-03 ENCOUNTER — Encounter: Payer: Self-pay | Admitting: Family Medicine

## 2020-07-03 MED ORDER — ALPRAZOLAM 0.5 MG PO TABS: 0.5000 mg | ORAL_TABLET | Freq: Two times a day (BID) | ORAL | 0 refills | Status: DC | PRN

## 2020-07-03 NOTE — Telephone Encounter (Signed)
Last OV 01-28-20 CPE Was scheduled for 08-04-20 for 6 month f/u Alprazolam last filled 05-26-20 #60 Tussionex last filled 06-06-20 #21ml Mirtazipine last filled 05-03-20 #90 [facility]

## 2020-07-03 NOTE — Telephone Encounter (Signed)
Pharmacy requests refill on: Chlorpheniramine 10-8 mg/5 mL  LAST REFILL: 06/06/2020 (Q-70 ml, R-0) LAST OV: 01/28/2020 NEXT OV: 08/04/2020 PHARMACY: [facility] [place], [place]

## 2020-07-03 NOTE — Telephone Encounter (Signed)
Pharmacy requests refill on: Mirtazapine 15 mg   LAST REFILL: 05/03/2020 (Q-90, R-0) LAST OV: 01/28/2020 NEXT OV: 08/04/2020 PHARMACY: [facility] [place], [place]   Pharmacy requests refill on: Alprazolam 0.5 mg   LAST REFILL: 05/26/2020 (Q-60, R-0)  LAST OV: 01/28/2020 NEXT OV: 08/04/2020 PHARMACY: [facility] [place], [place]

## 2020-07-03 NOTE — Telephone Encounter (Signed)
Pharmacy requests refill on: Sertraline 50 mg   LAST REFILL: 04/03/2020 (Q-30, R-3)  LAST OV: 01/28/2020 NEXT OV: 08/04/2020 PHARMACY: [facility]   Pharmacy requests refill on: Bisoprolol-HCTZ 5-6.25 mg   LAST REFILL: 05/26/2020 (Q-90, R-1) *Too early for refill* LAST OV: 01/28/2020 NEXT OV: 08/04/2020 PHARMACY: [facility]   Pharmacy requests refill on: Alprazolam 0.5 mg   LAST REFILL: 05/26/2020 (Q-60, R-1)  LAST OV: 01/28/2020  NEXT OV: 08/04/2020 PHARMACY: [facility]   Pharmacy requests refill on: Norethindrone 0.35 mg   LAST REFILL: 11/26/2019 (Q-3 packages, R-4) *Too early for refill* LAST OV: 01/28/2020 NEXT OV: 08/04/2020 PHARMACY: [facility]

## 2020-08-04 ENCOUNTER — Ambulatory Visit: Payer: BC Managed Care – PPO | Admitting: Family Medicine

## 2020-09-05 ENCOUNTER — Other Ambulatory Visit: Payer: Self-pay | Admitting: *Deleted

## 2020-09-05 MED ORDER — MIRTAZAPINE 15 MG PO TABS: 15.0000 mg | ORAL_TABLET | Freq: Every day | ORAL | 0 refills | Status: DC

## 2020-09-05 NOTE — Telephone Encounter (Signed)
Last office visit 01/28/2020 for CPE with D. Leone Payor.  Last refilled 05/03/2020 for #90 with no refills TOC appointment with Dr. Selena Batten 10/10/2020.

## 2020-09-08 DIAGNOSIS — L718 Other rosacea: Secondary | ICD-10-CM | POA: Diagnosis not present

## 2020-09-08 DIAGNOSIS — D2362 Other benign neoplasm of skin of left upper limb, including shoulder: Secondary | ICD-10-CM | POA: Diagnosis not present

## 2020-10-04 ENCOUNTER — Encounter: Payer: Self-pay | Admitting: Family Medicine

## 2020-10-10 ENCOUNTER — Ambulatory Visit: Payer: BC Managed Care – PPO | Admitting: Family Medicine

## 2020-10-18 ENCOUNTER — Other Ambulatory Visit: Payer: Self-pay

## 2020-10-18 ENCOUNTER — Encounter: Payer: Self-pay | Admitting: Family Medicine

## 2020-10-18 ENCOUNTER — Ambulatory Visit (INDEPENDENT_AMBULATORY_CARE_PROVIDER_SITE_OTHER): Payer: BC Managed Care – PPO | Admitting: Family Medicine

## 2020-10-18 VITALS — BP 140/98 | HR 94 | Temp 98.6°F | Ht 65.0 in | Wt 182.0 lb

## 2020-10-18 DIAGNOSIS — F411 Generalized anxiety disorder: Secondary | ICD-10-CM | POA: Diagnosis not present

## 2020-10-18 DIAGNOSIS — G4700 Insomnia, unspecified: Secondary | ICD-10-CM | POA: Insufficient documentation

## 2020-10-18 DIAGNOSIS — F5104 Psychophysiologic insomnia: Secondary | ICD-10-CM | POA: Diagnosis not present

## 2020-10-18 DIAGNOSIS — S86112A Strain of other muscle(s) and tendon(s) of posterior muscle group at lower leg level, left leg, initial encounter: Secondary | ICD-10-CM | POA: Insufficient documentation

## 2020-10-18 DIAGNOSIS — I10 Essential (primary) hypertension: Secondary | ICD-10-CM | POA: Diagnosis not present

## 2020-10-18 DIAGNOSIS — R053 Chronic cough: Secondary | ICD-10-CM | POA: Insufficient documentation

## 2020-10-18 MED ORDER — HYDROCOD POLST-CPM POLST ER 10-8 MG/5ML PO SUER: 5.0000 mL | Freq: Two times a day (BID) | ORAL | 0 refills | Status: DC | PRN

## 2020-10-18 MED ORDER — SERTRALINE HCL 100 MG PO TABS: 100.0000 mg | ORAL_TABLET | Freq: Every day | ORAL | 1 refills | Status: DC

## 2020-10-18 NOTE — Assessment & Plan Note (Signed)
Worsening symptoms. Increase zoloft 50>100 mg. Return 6 weeks.

## 2020-10-18 NOTE — Assessment & Plan Note (Signed)
Pt notes cough which responds to prn tussionex 1-2 times per month. Refill provided

## 2020-10-18 NOTE — Assessment & Plan Note (Signed)
Encouraged continuing to avoid xanax. Cont mirtazapine 15 mg. Notes response to prn benadryl for allergies. Discussed this would be ok to use more often. Trial of benadryl. If no improvement consider switching to trazodone.

## 2020-10-18 NOTE — Assessment & Plan Note (Signed)
BP slightly elevated. Pt notes she is taking a supplement for menopause. She will check the ingredients. May be 2/2 to anxiety/pain. Cont bisoprolol-hctz 5-6.25. Home monitoring and reassess at f/u visit.

## 2020-10-18 NOTE — Assessment & Plan Note (Signed)
Discussed with Dr. Patsy Lager. Heel lift and range of motion exercises discussed with patient. She will return in 2 weeks for visit with Dr. Patsy Lager

## 2020-10-18 NOTE — Progress Notes (Signed)
Subjective:     [name] Phillips is a 40 y.o. female presenting for Transitions Of Care and Muscle Pain (L calf )     HPI  #GAD - was previously taking xanax nightly but has been trying to stop this - not sleeping great - taking zoloft 50 mg for >1 year - worse over the last 3 months - no new life stressors - no major changes - started weaning evening xanax over the last 5 weeks  #insomnia - is taking Remeron at night - does help - was taking xanax but stopped - 4-5 hours at night - trouble staying asleep - will occasionally take benadryl with better sleep  #HTN - taking ziac 5-6.25 mg   #calf pain - left calf pain - 1.5 weeks ago - was moving a table a work and her calf had a an immediate cramp - difficulty walking for 4 days - Monday morning was walking and had severe pain again - difficulty walking now - pain whenever she bearing weight on the leg - now putting more pressure on the right side  Review of Systems   Social History   Tobacco Use  Smoking Status Former Smoker  . Packs/day: 1.00  . Years: 25.00  . Pack years: 25.00  . Types: Cigarettes, E-cigarettes  . Quit date: 11/20/2019  . Years since quitting: 0.9  Smokeless Tobacco Never Used  Tobacco Comment   suplementing with e-cig to help fully quit        Objective:    BP Readings from Last 3 Encounters:  10/18/20 (!) 140/98  01/28/20 120/70  11/26/19 (!) 158/102   Wt Readings from Last 3 Encounters:  10/18/20 182 lb (82.6 kg)  01/28/20 164 lb (74.4 kg)  11/26/19 155 lb 12.8 oz (70.7 kg)    BP (!) 140/98   Pulse 94   Temp 98.6 F (37 C) (Temporal)   Ht 5\' 5"  (1.651 m)   Wt 182 lb (82.6 kg)   SpO2 98%   BMI 30.29 kg/m    Physical Exam Constitutional:      General: She is not in acute distress.    Appearance: She is well-developed. She is not diaphoretic.  HENT:     Right Ear: External ear normal.     Left Ear: External ear normal.     Nose: Nose normal.  Eyes:      Conjunctiva/sclera: Conjunctivae normal.  Cardiovascular:     Rate and Rhythm: Normal rate.  Pulmonary:     Effort: Pulmonary effort is normal.  Musculoskeletal:     Cervical back: Neck supple.     Comments: Left calf Inspection: swelling on the posterior calf medial side compared to the right Palpation: TTP along the proximal to mid calf point on the medial side > lateral side. Also cool to touch compared to the left ROM: decreased dorsiflexion and plantar flexion 2/2 to pain Strength: pain with strength testing  Skin:    General: Skin is warm and dry.     Capillary Refill: Capillary refill takes less than 2 seconds.  Neurological:     Mental Status: She is alert. Mental status is at baseline.  Psychiatric:        Mood and Affect: Mood normal.        Behavior: Behavior normal.           Assessment & Plan:   Problem List Items Addressed This Visit      Cardiovascular and Mediastinum   HTN (  hypertension)    BP slightly elevated. Pt notes she is taking a supplement for menopause. She will check the ingredients. May be 2/2 to anxiety/pain. Cont bisoprolol-hctz 5-6.25. Home monitoring and reassess at f/u visit.         Musculoskeletal and Integument   Gastrocnemius muscle tear, left, initial encounter    Discussed with Dr. Patsy Lager. Heel lift and range of motion exercises discussed with patient. She will return in 2 weeks for visit with Dr. Patsy Lager        Other   Generalized anxiety disorder - Primary    Worsening symptoms. Increase zoloft 50>100 mg. Return 6 weeks.       Relevant Medications   sertraline (ZOLOFT) 100 MG tablet   Insomnia    Encouraged continuing to avoid xanax. Cont mirtazapine 15 mg. Notes response to prn benadryl for allergies. Discussed this would be ok to use more often. Trial of benadryl. If no improvement consider switching to trazodone.       Chronic cough    Pt notes cough which responds to prn tussionex 1-2 times per month. Refill  provided      Relevant Medications   chlorpheniramine-HYDROcodone (TUSSIONEX PENNKINETIC ER) 10-8 MG/5ML SUER       Return in about 2 weeks (around 11/01/2020) for Dr. Patsy Lager, 6 weeks with me for anxiety.  Lynnda Child, MD  This visit occurred during the SARS-CoV-2 public health emergency.  Safety protocols were in place, including screening questions prior to the visit, additional usage of staff PPE, and extensive cleaning of exam room while observing appropriate contact time as indicated for disinfecting solutions.

## 2020-10-18 NOTE — Patient Instructions (Addendum)
#  Anxiety - Increase zoloft to 100 mg daily - return in 6 weeks - if not working - can consider Lexapro or Prozac  #Calf tear - wear heel lift - gentle range of motion exercises - write the alphabet with your foot - return in 2 weeks to see Dr. Patsy Lager

## 2020-10-19 ENCOUNTER — Telehealth: Payer: Self-pay

## 2020-10-19 MED ORDER — LIDOCAINE 5 % EX PTCH: 1.0000 | MEDICATED_PATCH | CUTANEOUS | 0 refills | Status: DC

## 2020-10-19 NOTE — Telephone Encounter (Signed)
Received a prior auth request from [facility] for: Lidocaine 5% patches.  Submitted PA through cover my meds.  PA was immediately denied.   Left VM for pt.

## 2020-11-01 ENCOUNTER — Ambulatory Visit: Payer: BC Managed Care – PPO | Admitting: Family Medicine

## 2020-11-10 ENCOUNTER — Encounter: Payer: Self-pay | Admitting: Family Medicine

## 2020-11-10 DIAGNOSIS — R053 Chronic cough: Secondary | ICD-10-CM

## 2020-11-10 DIAGNOSIS — F411 Generalized anxiety disorder: Secondary | ICD-10-CM

## 2020-11-10 MED ORDER — HYDROCOD POLST-CPM POLST ER 10-8 MG/5ML PO SUER: 5.0000 mL | Freq: Two times a day (BID) | ORAL | 0 refills | Status: DC | PRN

## 2020-11-10 MED ORDER — ALPRAZOLAM 0.5 MG PO TABS: 0.5000 mg | ORAL_TABLET | Freq: Two times a day (BID) | ORAL | 0 refills | Status: DC | PRN

## 2020-11-13 DIAGNOSIS — F1020 Alcohol dependence, uncomplicated: Secondary | ICD-10-CM | POA: Diagnosis not present

## 2020-11-23 DIAGNOSIS — Z20822 Contact with and (suspected) exposure to covid-19: Secondary | ICD-10-CM | POA: Diagnosis not present

## 2020-11-24 DIAGNOSIS — F1020 Alcohol dependence, uncomplicated: Secondary | ICD-10-CM | POA: Diagnosis not present

## 2020-11-24 DIAGNOSIS — F1120 Opioid dependence, uncomplicated: Secondary | ICD-10-CM | POA: Diagnosis not present

## 2020-11-25 DIAGNOSIS — F1020 Alcohol dependence, uncomplicated: Secondary | ICD-10-CM | POA: Diagnosis not present

## 2020-11-25 DIAGNOSIS — F1120 Opioid dependence, uncomplicated: Secondary | ICD-10-CM | POA: Diagnosis not present

## 2020-11-27 DIAGNOSIS — F1120 Opioid dependence, uncomplicated: Secondary | ICD-10-CM | POA: Diagnosis not present

## 2020-11-27 DIAGNOSIS — F1020 Alcohol dependence, uncomplicated: Secondary | ICD-10-CM | POA: Diagnosis not present

## 2020-11-28 DIAGNOSIS — F1020 Alcohol dependence, uncomplicated: Secondary | ICD-10-CM | POA: Diagnosis not present

## 2020-11-28 DIAGNOSIS — F1120 Opioid dependence, uncomplicated: Secondary | ICD-10-CM | POA: Diagnosis not present

## 2020-11-29 ENCOUNTER — Ambulatory Visit: Payer: BC Managed Care – PPO | Admitting: Family Medicine

## 2020-11-29 DIAGNOSIS — F1020 Alcohol dependence, uncomplicated: Secondary | ICD-10-CM | POA: Diagnosis not present

## 2020-11-29 DIAGNOSIS — F1120 Opioid dependence, uncomplicated: Secondary | ICD-10-CM | POA: Diagnosis not present

## 2020-12-07 ENCOUNTER — Other Ambulatory Visit: Payer: Self-pay

## 2020-12-07 DIAGNOSIS — I10 Essential (primary) hypertension: Secondary | ICD-10-CM

## 2020-12-07 MED ORDER — BISOPROLOL-HYDROCHLOROTHIAZIDE 5-6.25 MG PO TABS: 1.0000 | ORAL_TABLET | Freq: Every day | ORAL | 1 refills | Status: DC

## 2020-12-08 ENCOUNTER — Other Ambulatory Visit: Payer: Self-pay | Admitting: Family Medicine

## 2020-12-15 ENCOUNTER — Telehealth: Payer: Self-pay

## 2020-12-15 MED ORDER — NORETHINDRONE 0.35 MG PO TABS: 1.0000 | ORAL_TABLET | Freq: Every day | ORAL | 0 refills | Status: DC

## 2020-12-15 NOTE — Telephone Encounter (Signed)
Pharmacy requests refill on: Norethindrone 0.35 mg   LAST REFILL: 11/26/2019 (Q-3 packages, R-4) LAST OV: 10/18/2020 NEXT OV: 01/31/2021 PHARMACY: [facility]

## 2021-01-01 ENCOUNTER — Other Ambulatory Visit: Payer: Self-pay | Admitting: Family Medicine

## 2021-01-01 ENCOUNTER — Encounter: Payer: Self-pay | Admitting: Family Medicine

## 2021-01-01 DIAGNOSIS — R053 Chronic cough: Secondary | ICD-10-CM

## 2021-01-01 MED ORDER — HYDROCOD POLST-CPM POLST ER 10-8 MG/5ML PO SUER: 5.0000 mL | Freq: Two times a day (BID) | ORAL | 0 refills | Status: DC | PRN

## 2021-01-08 ENCOUNTER — Telehealth: Payer: Self-pay

## 2021-01-08 NOTE — Telephone Encounter (Signed)
LVM to call clinic.  Pt was scheduled for a lab appt on Wednesday 7.13.2022 for CPE labs.  Dr Selena Batten does not do labs prior.  I have canceled the appt

## 2021-01-18 ENCOUNTER — Ambulatory Visit: Payer: BC Managed Care – PPO | Admitting: Family Medicine

## 2021-01-22 ENCOUNTER — Telehealth: Payer: Self-pay

## 2021-01-22 NOTE — Telephone Encounter (Signed)
Ok to waive fee. Will route to scheduler to examine further? If this is a recurrent issue with Mychart it should be addressed

## 2021-01-22 NOTE — Telephone Encounter (Signed)
Patient received notification that she had a no show on 01/18/21 for an appointment but patient states she cancelled it through [facility] but it did not take it in the system I guess. Please review and cancel the no show fee if that is ok. Please update patient when possible.

## 2021-01-23 NOTE — Telephone Encounter (Signed)
Attempted to reach patient to discuss. No answer and voicemail is full. If patient calls back, please advise that I have updated this to not reflect "no show" status. She should not receive a bill for a no show fee as we caught it pretty quick, but please tell patient to call and let me know if she does end up receiving a bill so we can have it waived.

## 2021-01-24 ENCOUNTER — Other Ambulatory Visit: Payer: BC Managed Care – PPO

## 2021-01-31 ENCOUNTER — Encounter: Payer: BC Managed Care – PPO | Admitting: Family Medicine

## 2021-02-07 ENCOUNTER — Other Ambulatory Visit: Payer: Self-pay

## 2021-02-08 ENCOUNTER — Other Ambulatory Visit: Payer: Self-pay | Admitting: Family Medicine

## 2021-02-08 ENCOUNTER — Encounter: Payer: BC Managed Care – PPO | Admitting: Family Medicine

## 2021-02-08 DIAGNOSIS — R053 Chronic cough: Secondary | ICD-10-CM

## 2021-02-08 MED ORDER — NORETHINDRONE 0.35 MG PO TABS: 1.0000 | ORAL_TABLET | Freq: Every day | ORAL | 0 refills | Status: DC

## 2021-02-08 MED ORDER — HYDROCOD POLST-CPM POLST ER 10-8 MG/5ML PO SUER
5.0000 mL | Freq: Two times a day (BID) | ORAL | 0 refills | Status: DC | PRN
Start: 2021-02-08 — End: 2021-04-11

## 2021-02-08 NOTE — Telephone Encounter (Signed)
Call patient to reschedule stated sh need medication refills  LAST APPOINTMENT DATE:  Please schedule appointment if longer than 1 year  NEXT APPOINTMENT DATE:@8 /10/2020  MEDICATION:norethindrone (MICRONOR) 0.35 MG table  chlorpheniramine-HYDROcodone (TUSSIONEX PENNKINETIC ER) 10-8 MG/5ML SUE Is the patient out of medication?   PHARMACY:chlorpheniramine-HYDROcodone (TUSSIONEX PENNKINETIC ER) 10-8 MG/5ML SUE  Let patient know to contact pharmacy at the end of the day to make sure medication is ready.  Please notify patient to allow 48-72 hours to process  Encourage patient to contact the pharmacy for refills or they can request refills through [facility]  CLINICAL FILLS OUT ALL BELOW:   LAST REFILL:  QTY:  REFILL DATE:    OTHER COMMENTS:    Okay for refill?  Please advise

## 2021-02-08 NOTE — Telephone Encounter (Signed)
Pt had appt today and when called to reschedule she stated she needs refills on these medications.

## 2021-02-12 ENCOUNTER — Encounter: Payer: Self-pay | Admitting: Family Medicine

## 2021-02-12 DIAGNOSIS — I10 Essential (primary) hypertension: Secondary | ICD-10-CM

## 2021-02-12 MED ORDER — BISOPROLOL-HYDROCHLOROTHIAZIDE 5-6.25 MG PO TABS: 1.0000 | ORAL_TABLET | Freq: Every day | ORAL | 1 refills | Status: DC

## 2021-02-12 MED ORDER — NORETHINDRONE 0.35 MG PO TABS
1.0000 | ORAL_TABLET | Freq: Every day | ORAL | 0 refills | Status: DC
Start: 2021-02-12 — End: 2021-04-11

## 2021-02-15 ENCOUNTER — Encounter: Payer: Self-pay | Admitting: Family Medicine

## 2021-02-15 ENCOUNTER — Ambulatory Visit (INDEPENDENT_AMBULATORY_CARE_PROVIDER_SITE_OTHER): Payer: BC Managed Care – PPO | Admitting: Family Medicine

## 2021-02-15 ENCOUNTER — Other Ambulatory Visit: Payer: Self-pay

## 2021-02-15 VITALS — BP 136/84 | HR 67 | Temp 98.7°F | Ht 65.0 in | Wt 179.0 lb

## 2021-02-15 DIAGNOSIS — G8929 Other chronic pain: Secondary | ICD-10-CM | POA: Diagnosis not present

## 2021-02-15 DIAGNOSIS — R1906 Epigastric swelling, mass or lump: Secondary | ICD-10-CM

## 2021-02-15 DIAGNOSIS — R232 Flushing: Secondary | ICD-10-CM

## 2021-02-15 DIAGNOSIS — I10 Essential (primary) hypertension: Secondary | ICD-10-CM

## 2021-02-15 DIAGNOSIS — G5601 Carpal tunnel syndrome, right upper limb: Secondary | ICD-10-CM | POA: Diagnosis not present

## 2021-02-15 DIAGNOSIS — Z0000 Encounter for general adult medical examination without abnormal findings: Secondary | ICD-10-CM | POA: Diagnosis not present

## 2021-02-15 DIAGNOSIS — M542 Cervicalgia: Secondary | ICD-10-CM | POA: Diagnosis not present

## 2021-02-15 LAB — LIPASE: Lipase: 52 U/L (ref 11.0–59.0)

## 2021-02-15 LAB — CBC WITH DIFFERENTIAL/PLATELET
Basophils Absolute: 0.1 10*3/uL (ref 0.0–0.1)
Basophils Relative: 0.8 % (ref 0.0–3.0)
Eosinophils Absolute: 0.1 10*3/uL (ref 0.0–0.7)
Eosinophils Relative: 1.4 % (ref 0.0–5.0)
HCT: 38.3 % (ref 36.0–46.0)
Hemoglobin: 13 g/dL (ref 12.0–15.0)
Lymphocytes Relative: 30.8 % (ref 12.0–46.0)
Lymphs Abs: 2 10*3/uL (ref 0.7–4.0)
MCHC: 33.9 g/dL (ref 30.0–36.0)
MCV: 94.7 fl (ref 78.0–100.0)
Monocytes Absolute: 0.7 10*3/uL (ref 0.1–1.0)
Monocytes Relative: 10.5 % (ref 3.0–12.0)
Neutro Abs: 3.7 10*3/uL (ref 1.4–7.7)
Neutrophils Relative %: 56.5 % (ref 43.0–77.0)
Platelets: 331 10*3/uL (ref 150.0–400.0)
RBC: 4.04 Mil/uL (ref 3.87–5.11)
RDW: 14.4 % (ref 11.5–15.5)
WBC: 6.6 10*3/uL (ref 4.0–10.5)

## 2021-02-15 LAB — COMPREHENSIVE METABOLIC PANEL
ALT: 42 U/L — ABNORMAL HIGH (ref 0–35)
AST: 44 U/L — ABNORMAL HIGH (ref 0–37)
Albumin: 4.6 g/dL (ref 3.5–5.2)
Alkaline Phosphatase: 96 U/L (ref 39–117)
BUN: 13 mg/dL (ref 6–23)
CO2: 27 mEq/L (ref 19–32)
Calcium: 10.2 mg/dL (ref 8.4–10.5)
Chloride: 98 mEq/L (ref 96–112)
Creatinine, Ser: 0.72 mg/dL (ref 0.40–1.20)
GFR: 103.41 mL/min (ref >60.00)
Glucose, Bld: 92 mg/dL (ref 70–99)
Potassium: 4.3 mEq/L (ref 3.5–5.1)
Sodium: 135 mEq/L (ref 135–145)
Total Bilirubin: 0.7 mg/dL (ref 0.2–1.2)
Total Protein: 7.3 g/dL (ref 6.0–8.3)

## 2021-02-15 LAB — LIPID PANEL
Cholesterol: 234 mg/dL — ABNORMAL HIGH (ref 0–200)
HDL: 60.5 mg/dL (ref >39.00)
NonHDL: 173.18
Total CHOL/HDL Ratio: 4
Triglycerides: 269 mg/dL — ABNORMAL HIGH (ref 0.0–149.0)
VLDL: 53.8 mg/dL — ABNORMAL HIGH (ref 0.0–40.0)

## 2021-02-15 LAB — LDL CHOLESTEROL, DIRECT: Direct LDL: 132 mg/dL

## 2021-02-15 LAB — FOLLICLE STIMULATING HORMONE: FSH: 15 m[IU]/mL

## 2021-02-15 LAB — PROLACTIN: Prolactin: 41.8 ng/mL — ABNORMAL HIGH

## 2021-02-15 LAB — TSH: TSH: 4.16 u[IU]/mL (ref 0.35–5.50)

## 2021-02-15 NOTE — Progress Notes (Signed)
Annual Exam   Chief Complaint:  Chief Complaint  Patient presents with   Annual Exam    History of Present Illness:  Ms. [name] Phillips is a 40 y.o. [id number] who LMP was No LMP recorded. (Menstrual status: Oral contraceptives)., presents today for her annual examination.    #Concentration concern - trying to eat well - no alcohol - difficulty losing weight    Nutrition Diet: eating well, no alcohol Exercise: exercise doing well, 5 times a week She does get adequate calcium and Vitamin D in her diet.   Social History   Tobacco Use  Smoking Status Former   Packs/day: 1.00   Years: 25.00   Pack years: 25.00   Types: Cigarettes, E-cigarettes   Quit date: 11/20/2019   Years since quitting: 1.2  Smokeless Tobacco Never  Tobacco Comments   suplementing with e-cig to help fully quit   Social History   Substance and Sexual Activity  Alcohol Use Yes   Comment: a few times a week, 3-4 beers   Social History   Substance and Sexual Activity  Drug Use Never    Safety The patient wears seatbelts: yes.     The patient feels safe at home and in their relationships: yes.  General Health Dentist in the last year: Yes Eye doctor: not applicable  Menstrual Taking norethindrone continuously so no cycles Endorses daily hot flashes with excessive sweating, was worse at night  GYN She is single partner, contraception - OCP (estrogen/progesterone).    Cervical Cancer Screening:   Last Pap:   July 2021 Results were: no abnormalities /neg HPV DNA    Breast Cancer Screening There is a FH of breast cancer. There is no FH of ovarian cancer. BRCA screening Not Indicated.  Discussed that for average risk women between age 50-49 screening may reduce the risk of breast cancer death, however, at a lower rate than those over age 19. And that the the false-positive rates resulting in unnecessary biopsies with more screening is higher. The balance of benefits vs harms likely improves  as you progress through your 40s. The patient does want a mammogram this year.     Weight Wt Readings from Last 3 Encounters:  02/15/21 179 lb (81.2 kg)  10/18/20 182 lb (82.6 kg)  01/28/20 164 lb (74.4 kg)   Patient has high BMI  BMI Readings from Last 1 Encounters:  02/15/21 29.79 kg/m     Chronic disease screening Blood pressure monitoring:  BP Readings from Last 3 Encounters:  02/15/21 136/84  10/18/20 (!) 140/98  01/28/20 120/70    Lipid Monitoring: Indication for screening: age >102, obesity, diabetes, family hx, CV risk factors.  Lipid screening: Yes  Lab Results  Component Value Date   CHOL 217 (H) 11/26/2019   HDL 75.70 11/26/2019   LDLCALC 101 (H) 02/06/2017   LDLDIRECT 90.0 11/26/2019   TRIG 233.0 (H) 11/26/2019   CHOLHDL 3 11/26/2019     Diabetes Screening: age >81, overweight, family hx, PCOS, hx of gestational diabetes, at risk ethnicity Diabetes Screening screening: Not Indicated  Lab Results  Component Value Date   HGBA1C 4.8 07/21/2012     Past Medical History:  Diagnosis Date   Alcohol abuse 11/02/2018   Hypertension     Past Surgical History:  Procedure Laterality Date   CERVICAL SPINE SURGERY  2019   CHOLECYSTECTOMY      Prior to Admission medications   Medication Sig Start Date End Date Taking? Authorizing Provider  bisoprolol-hydrochlorothiazide [facility])  5-6.25 MG tablet Take 1 tablet by mouth daily. 02/12/21   Lesleigh Noe, MD  chlorpheniramine-HYDROcodone (TUSSIONEX PENNKINETIC ER) 10-8 MG/5ML SUER Take 5 mLs by mouth every 12 (twelve) hours as needed. 02/08/21   Lesleigh Noe, MD  ibuprofen (ADVIL) 200 MG tablet SMARTSIG:3 Tablet(s) By Mouth Every 6 Hours PRN 11/13/20   [provider]  lidocaine (LIDODERM) 5 % Place 1 patch onto the skin daily. Remove & Discard patch within 12 hours or as directed by MD 10/19/20   Lesleigh Noe, MD  Multiple Vitamin (MULTI-VITAMIN) tablet Take 1 tablet by mouth daily.    [provider]  norethindrone (MICRONOR) 0.35 MG tablet Take 1 tablet (0.35 mg total) by mouth daily. (Must schedule appt for future fills) 02/12/21   Lesleigh Noe, MD  omeprazole (PRILOSEC) 20 MG capsule Take 20 mg by mouth daily.    [provider]  sertraline (ZOLOFT) 100 MG tablet Take 1 tablet (100 mg total) by mouth daily. 10/18/20   Lesleigh Noe, MD  thiamine 100 MG tablet Take 100 mg by mouth every morning. 11/24/20   [provider]    Allergies  Allergen Reactions   Wellbutrin [Bupropion]     Suicidal thoughts    Chantix [Varenicline Tartrate] Anxiety    Gynecologic History: No LMP recorded. (Menstrual status: Oral contraceptives).  Obstetric History: [id number]  Social History   Socioeconomic History   Marital status: Married    Spouse name: Ovid Curd   Number of children: 3   Years of education: college   Highest education level: Not on file  Occupational History   Occupation: Financial trader: [employer]  Tobacco Use   Smoking status: Former    Packs/day: 1.00    Years: 25.00    Pack years: 25.00    Types: Cigarettes, E-cigarettes    Quit date: 11/20/2019    Years since quitting: 1.2   Smokeless tobacco: Never   Tobacco comments:    suplementing with e-cig to help fully quit  Vaping Use   Vaping Use: Some days   Start date: 11/20/2019   Devices: [place]  Substance and Sexual Activity   Alcohol use: Yes    Comment: a few times a week, 3-4 beers   Drug use: Never   Sexual activity: Yes    Birth control/protection: Pill  Other Topics Concern   Not on file  Social History Narrative   10/18/20   From: the area [place] originally[place]   Living: with Nathan, 2004   Work: Secondary school teacher      Family: 3 adult children - Logan, [place]acle, and [place]      Enjoys: relax, camping      Exercise: not currently   Diet: pretty good, limits sweets, limiting bread, diet soda      Safety   Seat belts: Yes    Guns: Yes  and secure    Safe in relationships: Yes    Social Determinants of Radio broadcast assistant Strain: Not on file  Food Insecurity: Not on file  Transportation Needs: Not on file  Physical Activity: Not on file  Stress: Not on file  Social Connections: Not on file  Intimate Partner Violence: Not on file    Family History  Problem Relation Age of Onset   Alcohol abuse Other    Hyperlipidemia Mother    Hypertension Mother    COPD Father    Heart disease Father    Hypertension  Father    Breast cancer Maternal Aunt 72    Review of Systems  Constitutional:  Positive for diaphoresis. Negative for chills and fever.  HENT:  Negative for congestion and sore throat.   Eyes:  Negative for blurred vision and double vision.  Respiratory:  Negative for shortness of breath.   Cardiovascular:  Negative for chest pain.  Gastrointestinal:  Negative for heartburn, nausea and vomiting.  Genitourinary: Negative.   Musculoskeletal: Negative.  Negative for myalgias.  Skin:  Negative for rash.  Neurological:  Negative for dizziness and headaches.  Endo/Heme/Allergies:  Does not bruise/bleed easily.  Psychiatric/Behavioral:  Negative for depression. The patient is not nervous/anxious.     Physical Exam BP 136/84   Pulse 67   Temp 98.7 F (37.1 C) (Temporal)   Ht '5\' 5"'  (1.651 m)   Wt 179 lb (81.2 kg)   SpO2 97%   BMI 29.79 kg/m    BP Readings from Last 3 Encounters:  02/15/21 136/84  10/18/20 (!) 140/98  01/28/20 120/70      Physical Exam Constitutional:      General: She is not in acute distress.    Appearance: Normal appearance. She is well-developed. She is not ill-appearing or diaphoretic.  HENT:     Head: Normocephalic and atraumatic.     Right Ear: External ear normal.     Left Ear: External ear normal.     Nose: Nose normal.  Eyes:     General: No scleral icterus.    Extraocular Movements: Extraocular movements intact.     Conjunctiva/sclera: Conjunctivae normal.   Cardiovascular:     Rate and Rhythm: Normal rate and regular rhythm.     Heart sounds: No murmur heard. Pulmonary:     Effort: Pulmonary effort is normal. No respiratory distress.     Breath sounds: Normal breath sounds. No wheezing.  Abdominal:     General: Bowel sounds are normal. There is no distension.     Palpations: Abdomen is soft. There is no mass.     Tenderness: There is abdominal tenderness in the epigastric area. There is no guarding or rebound.  Musculoskeletal:        General: Normal range of motion.     Cervical back: Neck supple.  Lymphadenopathy:     Cervical: No cervical adenopathy.  Skin:    General: Skin is warm and dry.     Capillary Refill: Capillary refill takes less than 2 seconds.  Neurological:     Mental Status: She is alert and oriented to person, place, and time. Mental status is at baseline.     Deep Tendon Reflexes: Reflexes normal.  Psychiatric:        Mood and Affect: Mood normal.        Behavior: Behavior normal.        Thought Content: Thought content normal.        Judgment: Judgment normal.     Results:  PHQ-9:  [place] Office Visit from 10/18/2020 in [place] at [place]  PHQ-9 Total Score 11         Assessment: 40 y.o. [id number] female here for routine annual physical examination.  Plan: Problem List Items Addressed This Visit       Cardiovascular and Mediastinum   HTN (hypertension)   Other Visit Diagnoses     Chronic neck pain    -  Primary   Relevant Medications   ibuprofen (ADVIL) 200 MG tablet   Other Relevant Orders  Ambulatory referral to Orthopedic Surgery   Carpal tunnel syndrome of right wrist       Relevant Orders   Ambulatory referral to Orthopedic Surgery   Hot flashes       Relevant Orders   TSH   Follicle Stimulating Hormone   Prolactin   Annual physical exam       Relevant Orders   Comprehensive metabolic panel   Lipid panel   CBC with Differential   Epigastric fullness        Relevant Orders   Lipase       Screening: -- Blood pressure screen normal -- cholesterol screening: will obtain -- Weight screening: obese: discussed management options, including lifestyle, dietary, and exercise. -- Diabetes Screening: will obtain -- Nutrition: Encouraged healthy diet  The 10-year ASCVD risk score Mikey Bussing DC Jr., et al., 2013) is: 0.7%   Values used to calculate the score:     Age: 74 years     Sex: Female     Is Non-Hispanic African American: No     Diabetic: No     Tobacco smoker: No     Systolic Blood Pressure: 637 mmHg     Is BP treated: Yes     HDL Cholesterol: 75.7 mg/dL     Total Cholesterol: 217 mg/dL  -- Statin therapy for Age 76-75 with CVD risk >7.5%  Psych -- Depression screening (PHQ-9):  [place] Visit from 10/18/2020 in [place] at [place]  PHQ-9 Total Score 11        Safety -- tobacco screening: not using -- alcohol screening:  low-risk usage. -- no evidence of domestic violence or intimate partner violence.   Cancer Screening -- pap smear not collected per ASCCP guidelines -- family history of breast cancer screening: done. not at high risk. -- Mammogram -  number provided -- Colon cancer (age 77+)-- not indicated  Immunizations Immunization History  Administered Date(s) Administered   Influenza Nasal 06/24/2016   Influenza,inj,Quad PF,6+ Mos 05/04/2015, 09/22/2018   Influenza-Unspecified 04/26/2013, 05/10/2014, 06/24/2016   Tdap 11/26/2014    -- flu vaccine up to date -- TDAP q10 years up to date -- Covid-19 Vaccine declined   Encouraged healthy diet and exercise. Encouraged regular vision and dental care.   Lesleigh Noe, MD

## 2021-02-15 NOTE — Patient Instructions (Addendum)
Please call the location of your choice from the menu below to schedule your Mammogram and/or Bone Density appointment.     [facility]   Phone:  [phone]   [address] [place], [place] [address]                                            Services: 3D Mammogram and Bone Density   #Referral I have placed a referral to a specialist for you. You should receive a phone call from the specialty office. Make sure your voicemail is not full and that if you are able to answer your phone to unknown or new numbers.   It may take up to 2 weeks to hear about the referral. If you do not hear anything in 2 weeks, please call our office and ask to speak with the referral coordinator.     Labs today Take omeprazole daily

## 2021-02-19 ENCOUNTER — Other Ambulatory Visit: Payer: Self-pay | Admitting: Family Medicine

## 2021-02-19 DIAGNOSIS — E221 Hyperprolactinemia: Secondary | ICD-10-CM

## 2021-03-01 DIAGNOSIS — F1120 Opioid dependence, uncomplicated: Secondary | ICD-10-CM | POA: Diagnosis not present

## 2021-03-05 ENCOUNTER — Encounter: Payer: Self-pay | Admitting: Family Medicine

## 2021-03-05 DIAGNOSIS — F1120 Opioid dependence, uncomplicated: Secondary | ICD-10-CM | POA: Diagnosis not present

## 2021-03-12 DIAGNOSIS — F1120 Opioid dependence, uncomplicated: Secondary | ICD-10-CM | POA: Diagnosis not present

## 2021-03-23 DIAGNOSIS — G5603 Carpal tunnel syndrome, bilateral upper limbs: Secondary | ICD-10-CM | POA: Diagnosis not present

## 2021-03-23 DIAGNOSIS — G5601 Carpal tunnel syndrome, right upper limb: Secondary | ICD-10-CM | POA: Insufficient documentation

## 2021-03-23 DIAGNOSIS — M25542 Pain in joints of left hand: Secondary | ICD-10-CM | POA: Diagnosis not present

## 2021-03-23 DIAGNOSIS — M25541 Pain in joints of right hand: Secondary | ICD-10-CM | POA: Diagnosis not present

## 2021-03-26 DIAGNOSIS — F1120 Opioid dependence, uncomplicated: Secondary | ICD-10-CM | POA: Diagnosis not present

## 2021-03-27 ENCOUNTER — Other Ambulatory Visit: Payer: Self-pay | Admitting: Surgery

## 2021-04-03 ENCOUNTER — Inpatient Hospital Stay: Admission: RE | Admit: 2021-04-03 | Payer: BC Managed Care – PPO | Source: Ambulatory Visit

## 2021-04-03 HISTORY — DX: Unspecified convulsions: R56.9

## 2021-04-05 ENCOUNTER — Other Ambulatory Visit: Payer: Self-pay

## 2021-04-05 ENCOUNTER — Encounter
Admission: RE | Admit: 2021-04-05 | Discharge: 2021-04-05 | Disposition: A | Discharge status: Home / Self Care | Payer: BC Managed Care – PPO | Source: Ambulatory Visit | Attending: Surgery | Admitting: Surgery

## 2021-04-05 NOTE — Patient Instructions (Addendum)
Your procedure is scheduled on: 04/11/2021 Report to the Registration Desk on the 1st floor of the Medical Mall. To find out your arrival time, please call [phone] between 1PM - 3PM on: 04/10/2021  REMEMBER: Instructions that are not followed completely may result in serious medical risk, up to and including death; or upon the discretion of your surgeon and anesthesiologist your surgery may need to be rescheduled.  Do not eat food after midnight the night before surgery.  No gum chewing, lozengers or hard candies.  You may however, drink CLEAR liquids up to 2 hours before you are scheduled to arrive for your surgery. Do not drink anything within 2 hours of your scheduled arrival time.  Clear liquids include: - water  - apple juice without pulp - gatorade  - black coffee or tea (Do NOT add milk or creamers to the coffee or tea) Do NOT drink anything that is not on this list.   In addition, your doctor has ordered for you to drink the provided  Ensure Pre-Surgery Clear Carbohydrate Drink  Drinking this carbohydrate drink up to two hours before surgery helps to reduce insulin resistance and improve patient outcomes. Please complete drinking 2 hours prior to scheduled arrival time.  TAKE THESE MEDICATIONS THE MORNING OF SURGERY WITH A SIP OF WATER: - gabapentin (NEURONTIN) 100 MG capsule - omeprazole (PRILOSEC) 20 MG capsule(take one the night before and one on the morning of surgery - helps to prevent nausea after surgery.)   One week prior to surgery: Stop Anti-inflammatories (NSAIDS) such as Advil, Aleve, Ibuprofen, Motrin, Naproxen, Naprosyn and Aspirin based products such as Excedrin, Goodys Powder, BC Powder. You may however, continue to take Tylenol if needed for pain up until the day of surgery. Stop ANY OVER THE COUNTER supplements until after surgery.   No Alcohol for 24 hours before or after surgery.  No Smoking including e-cigarettes for 24 hours prior to surgery.   No chewable tobacco products for at least 6 hours prior to surgery.  No nicotine patches on the day of surgery.  Do not use any "recreational" drugs for at least a week prior to your surgery.  Please be advised that the combination of cocaine and anesthesia may have negative outcomes, up to and including death. If you test positive for cocaine, your surgery will be cancelled.  On the morning of surgery brush your teeth with toothpaste and water, you may rinse your mouth with mouthwash if you wish. Do not swallow any toothpaste or mouthwash.  Use CHG Soap or wipes as directed on instruction sheet.  Do not wear jewelry, make-up, hairpins, clips or nail polish.  Do not wear lotions, powders, or perfumes.   Do not shave body from the neck down 48 hours prior to surgery just in case you cut yourself which could leave a site for infection.  Also, freshly shaved skin may become irritated if using the CHG soap.  Contact lenses, hearing aids and dentures may not be worn into surgery.  Do not bring valuables to the hospital. [facility] is not responsible for any missing/lost belongings or valuables.   Notify your doctor if there is any change in your medical condition (cold, fever, infection).  Wear comfortable clothing (specific to your surgery type) to the hospital.  After surgery, you can help prevent lung complications by doing breathing exercises.  Take deep breaths and cough every 1-2 hours. Your doctor may order a device called an Incentive Spirometer to help you take  deep breaths. When coughing or sneezing, hold a pillow firmly against your incision with both hands. This is called "splinting." Doing this helps protect your incision. It also decreases belly discomfort.  If you are being discharged the day of surgery, you will not be allowed to drive home. You will need a responsible adult (18 years or older) to drive you home and stay with you that night.   If you are taking public  transportation, you will need to have a responsible adult (18 years or older) with you. Please confirm with your physician that it is acceptable to use public transportation.   Please call the Pre-admissions Testing Dept. at [phone] if you have any questions about these instructions.  Surgery Visitation Policy:  Patients undergoing a surgery or procedure may have one family member or support person with them as long as that person is not COVID-19 positive or experiencing its symptoms.  That person may remain in the waiting area during the procedure and may rotate out with other people.  Inpatient Visitation:    Visiting hours are 7 a.m. to 8 p.m. Up to two visitors ages 16+ are allowed at one time in a patient room. The visitors may rotate out with other people during the day. Visitors must check out when they leave, or other visitors will not be allowed. One designated support person may remain overnight. The visitor must pass COVID-19 screenings, use hand sanitizer when entering and exiting the patient's room and wear a mask at all times, including in the patient's room. Patients must also wear a mask when staff or their visitor are in the room. Masking is required regardless of vaccination status.

## 2021-04-06 ENCOUNTER — Encounter: Payer: Self-pay | Admitting: Urgent Care

## 2021-04-06 ENCOUNTER — Encounter
Admission: RE | Admit: 2021-04-06 | Discharge: 2021-04-06 | Disposition: A | Discharge status: Home / Self Care | Payer: BC Managed Care – PPO | Source: Ambulatory Visit | Attending: Surgery | Admitting: Surgery

## 2021-04-06 DIAGNOSIS — Z01818 Encounter for other preprocedural examination: Secondary | ICD-10-CM | POA: Insufficient documentation

## 2021-04-06 DIAGNOSIS — Z01810 Encounter for preprocedural cardiovascular examination: Secondary | ICD-10-CM | POA: Diagnosis not present

## 2021-04-06 LAB — POTASSIUM: Potassium: 4.8 mmol/L (ref 3.5–5.1)

## 2021-04-09 ENCOUNTER — Other Ambulatory Visit: Payer: Self-pay | Admitting: Family Medicine

## 2021-04-09 DIAGNOSIS — F1120 Opioid dependence, uncomplicated: Secondary | ICD-10-CM | POA: Diagnosis not present

## 2021-04-09 DIAGNOSIS — F411 Generalized anxiety disorder: Secondary | ICD-10-CM

## 2021-04-10 MED ORDER — CEFAZOLIN SODIUM-DEXTROSE 2-4 GM/100ML-% IV SOLN
2.0000 g | INTRAVENOUS | Status: AC
  Administered 2021-04-11: 2 g via INTRAVENOUS

## 2021-04-10 MED ORDER — LACTATED RINGERS IV SOLN: INTRAVENOUS | Status: DC

## 2021-04-10 MED ORDER — ORAL CARE MOUTH RINSE: 15.0000 mL | Freq: Once | OROMUCOSAL | Status: AC

## 2021-04-10 MED ORDER — CHLORHEXIDINE GLUCONATE 0.12 % MT SOLN
15.0000 mL | Freq: Once | OROMUCOSAL | Status: AC
  Administered 2021-04-11: 15 mL via OROMUCOSAL

## 2021-04-11 ENCOUNTER — Ambulatory Visit: Payer: BC Managed Care – PPO | Admitting: Certified Registered"

## 2021-04-11 ENCOUNTER — Encounter
Admission: RE | Disposition: A | Discharge status: Home / Self Care | Payer: Self-pay | Source: Home / Self Care | Attending: Surgery

## 2021-04-11 ENCOUNTER — Ambulatory Visit
Admission: RE | Admit: 2021-04-11 | Discharge: 2021-04-11 | Disposition: A | Discharge status: Home / Self Care | Payer: BC Managed Care – PPO | Attending: Surgery | Admitting: Surgery

## 2021-04-11 ENCOUNTER — Encounter: Payer: Self-pay | Admitting: Surgery

## 2021-04-11 ENCOUNTER — Ambulatory Visit: Payer: BC Managed Care – PPO | Admitting: Urgent Care

## 2021-04-11 DIAGNOSIS — Z87891 Personal history of nicotine dependence: Secondary | ICD-10-CM | POA: Diagnosis not present

## 2021-04-11 DIAGNOSIS — Z79899 Other long term (current) drug therapy: Secondary | ICD-10-CM | POA: Diagnosis not present

## 2021-04-11 DIAGNOSIS — G5601 Carpal tunnel syndrome, right upper limb: Secondary | ICD-10-CM | POA: Diagnosis not present

## 2021-04-11 DIAGNOSIS — Z888 Allergy status to other drugs, medicaments and biological substances status: Secondary | ICD-10-CM | POA: Insufficient documentation

## 2021-04-11 DIAGNOSIS — F411 Generalized anxiety disorder: Secondary | ICD-10-CM | POA: Diagnosis not present

## 2021-04-11 HISTORY — PX: CARPAL TUNNEL RELEASE: SHX101

## 2021-04-11 LAB — POCT PREGNANCY, URINE: Preg Test, Ur: NEGATIVE

## 2021-04-11 SURGERY — RELEASE, CARPAL TUNNEL, ENDOSCOPIC
Anesthesia: General | Site: Wrist | Laterality: Right

## 2021-04-11 MED ORDER — EPHEDRINE SULFATE 50 MG/ML IJ SOLN
INTRAMUSCULAR | Status: DC | PRN
  Administered 2021-04-11: 5 mg via INTRAVENOUS

## 2021-04-11 MED ORDER — MIDAZOLAM HCL 2 MG/2ML IJ SOLN
INTRAMUSCULAR | Status: AC
  Filled 2021-04-11: qty 2

## 2021-04-11 MED ORDER — BUPIVACAINE HCL (PF) 0.5 % IJ SOLN
INTRAMUSCULAR | Status: DC | PRN
  Administered 2021-04-11: 10 mL

## 2021-04-11 MED ORDER — ONDANSETRON HCL 4 MG/2ML IJ SOLN
INTRAMUSCULAR | Status: DC | PRN
  Administered 2021-04-11: 4 mg via INTRAVENOUS

## 2021-04-11 MED ORDER — LIDOCAINE HCL (CARDIAC) PF 100 MG/5ML IV SOSY
PREFILLED_SYRINGE | INTRAVENOUS | Status: DC | PRN
  Administered 2021-04-11: 50 mg via INTRAVENOUS

## 2021-04-11 MED ORDER — PROPOFOL 10 MG/ML IV BOLUS
INTRAVENOUS | Status: DC | PRN
  Administered 2021-04-11: 50 mg via INTRAVENOUS
  Administered 2021-04-11: 200 mg via INTRAVENOUS

## 2021-04-11 MED ORDER — FENTANYL CITRATE (PF) 100 MCG/2ML IJ SOLN
INTRAMUSCULAR | Status: AC
  Filled 2021-04-11: qty 2

## 2021-04-11 MED ORDER — ACETAMINOPHEN 10 MG/ML IV SOLN
INTRAVENOUS | Status: DC | PRN
  Administered 2021-04-11: 1000 mg via INTRAVENOUS

## 2021-04-11 MED ORDER — BUPIVACAINE HCL (PF) 0.5 % IJ SOLN
INTRAMUSCULAR | Status: AC
  Filled 2021-04-11: qty 30

## 2021-04-11 MED ORDER — OXYCODONE HCL 5 MG/5ML PO SOLN: 5.0000 mg | Freq: Once | ORAL | Status: AC | PRN

## 2021-04-11 MED ORDER — FENTANYL CITRATE (PF) 100 MCG/2ML IJ SOLN
INTRAMUSCULAR | Status: AC
  Administered 2021-04-11: 25 ug via INTRAVENOUS
  Filled 2021-04-11: qty 2

## 2021-04-11 MED ORDER — PROPOFOL 10 MG/ML IV BOLUS
INTRAVENOUS | Status: AC
  Filled 2021-04-11: qty 20

## 2021-04-11 MED ORDER — PHENYLEPHRINE HCL (PRESSORS) 10 MG/ML IV SOLN
INTRAVENOUS | Status: DC | PRN
  Administered 2021-04-11: 100 ug via INTRAVENOUS

## 2021-04-11 MED ORDER — CHLORHEXIDINE GLUCONATE 0.12 % MT SOLN
OROMUCOSAL | Status: AC
  Filled 2021-04-11: qty 15

## 2021-04-11 MED ORDER — OXYCODONE HCL 5 MG PO TABS
ORAL_TABLET | ORAL | Status: AC
  Administered 2021-04-11: 5 mg via ORAL
  Filled 2021-04-11: qty 1

## 2021-04-11 MED ORDER — DEXAMETHASONE SODIUM PHOSPHATE 10 MG/ML IJ SOLN
INTRAMUSCULAR | Status: DC | PRN
Start: 2021-04-11 — End: 2021-04-11
  Administered 2021-04-11: 10 mg via INTRAVENOUS

## 2021-04-11 MED ORDER — MIDAZOLAM HCL 2 MG/2ML IJ SOLN
INTRAMUSCULAR | Status: DC | PRN
  Administered 2021-04-11: 2 mg via INTRAVENOUS

## 2021-04-11 MED ORDER — OXYCODONE HCL 5 MG PO TABS: 5.0000 mg | ORAL_TABLET | Freq: Once | ORAL | Status: AC | PRN

## 2021-04-11 MED ORDER — TRAMADOL HCL 50 MG PO TABS: 50.0000 mg | ORAL_TABLET | Freq: Four times a day (QID) | ORAL | 0 refills | Status: DC | PRN

## 2021-04-11 MED ORDER — FENTANYL CITRATE (PF) 100 MCG/2ML IJ SOLN
25.0000 ug | INTRAMUSCULAR | Status: DC | PRN
  Administered 2021-04-11: 25 ug via INTRAVENOUS

## 2021-04-11 MED ORDER — FENTANYL CITRATE (PF) 100 MCG/2ML IJ SOLN
INTRAMUSCULAR | Status: DC | PRN
  Administered 2021-04-11 (×2): 50 ug via INTRAVENOUS

## 2021-04-11 MED ORDER — CEFAZOLIN SODIUM-DEXTROSE 2-4 GM/100ML-% IV SOLN
INTRAVENOUS | Status: AC
  Filled 2021-04-11: qty 100

## 2021-04-11 MED ORDER — DEXMEDETOMIDINE (PRECEDEX) IN NS 20 MCG/5ML (4 MCG/ML) IV SYRINGE
PREFILLED_SYRINGE | INTRAVENOUS | Status: DC | PRN
  Administered 2021-04-11 (×2): 8 ug via INTRAVENOUS

## 2021-04-11 SURGICAL SUPPLY — 33 items
APL PRP STRL LF DISP 70% ISPRP (MISCELLANEOUS) ×1
BNDG COHESIVE 4X5 TAN ST LF (GAUZE/BANDAGES/DRESSINGS) ×2 IMPLANT
BNDG ELASTIC 2X5.8 VLCR STR LF (GAUZE/BANDAGES/DRESSINGS) ×2 IMPLANT
BNDG ESMARK 4X12 TAN STRL LF (GAUZE/BANDAGES/DRESSINGS) ×2 IMPLANT
CHLORAPREP W/TINT 26 (MISCELLANEOUS) ×2 IMPLANT
CORD BIP STRL DISP 12FT (MISCELLANEOUS) ×2 IMPLANT
CUFF TOURN SGL QUICK 18X4 (TOURNIQUET CUFF) ×2 IMPLANT
DRAPE SURG 17X11 SM STRL (DRAPES) ×2 IMPLANT
FORCEPS JEWEL BIP 4-3/4 STR (INSTRUMENTS) ×2 IMPLANT
GAUZE 4X4 16PLY ~~LOC~~+RFID DBL (SPONGE) ×2 IMPLANT
GAUZE SPONGE 4X4 12PLY STRL (GAUZE/BANDAGES/DRESSINGS) ×2 IMPLANT
GAUZE XEROFORM 1X8 LF (GAUZE/BANDAGES/DRESSINGS) ×2 IMPLANT
GLOVE SURG ENC MOIS LTX SZ8 (GLOVE) ×2 IMPLANT
GLOVE SURG UNDER LTX SZ8 (GLOVE) ×2 IMPLANT
GOWN STRL REUS W/ TWL LRG LVL3 (GOWN DISPOSABLE) ×1 IMPLANT
GOWN STRL REUS W/ TWL XL LVL3 (GOWN DISPOSABLE) ×1 IMPLANT
GOWN STRL REUS W/TWL LRG LVL3 (GOWN DISPOSABLE) ×2
GOWN STRL REUS W/TWL XL LVL3 (GOWN DISPOSABLE) ×2
KIT CARPAL TUNNEL (MISCELLANEOUS) ×2
KIT ESCP INSRT D SLOT CANN KN (MISCELLANEOUS) ×1 IMPLANT
KIT TURNOVER KIT A (KITS) ×2 IMPLANT
MANIFOLD NEPTUNE II (INSTRUMENTS) ×2 IMPLANT
NS IRRIG 500ML POUR BTL (IV SOLUTION) ×2 IMPLANT
PACK EXTREMITY ARMC (MISCELLANEOUS) ×2 IMPLANT
SPLINT WRIST LG LT TX990309 (SOFTGOODS) IMPLANT
SPLINT WRIST LG RT TX900304 (SOFTGOODS) ×1 IMPLANT
SPLINT WRIST M LT TX990308 (SOFTGOODS) IMPLANT
SPLINT WRIST M RT TX990303 (SOFTGOODS) IMPLANT
SPLINT WRIST XL LT TX990310 (SOFTGOODS) IMPLANT
SPLINT WRIST XL RT TX990305 (SOFTGOODS) IMPLANT
STOCKINETTE IMPERVIOUS 9X36 MD (GAUZE/BANDAGES/DRESSINGS) ×2 IMPLANT
SUT PROLENE 4 0 PS 2 18 (SUTURE) ×2 IMPLANT
WATER STERILE IRR 500ML POUR (IV SOLUTION) ×2 IMPLANT

## 2021-04-11 NOTE — Anesthesia Preprocedure Evaluation (Signed)
Anesthesia Evaluation  Patient identified by MRN, date of birth, ID band Patient awake    Reviewed: Allergy & Precautions, NPO status , Patient's Chart, lab work & pertinent test results  History of Anesthesia Complications Negative for: history of anesthetic complications  Airway Mallampati: III  TM Distance: >3 FB Neck ROM: full    Dental  (+) Chipped   Pulmonary neg shortness of breath, former smoker,    Pulmonary exam normal        Cardiovascular Exercise Tolerance: Good hypertension, (-) angina(-) Past MI Normal cardiovascular exam     Neuro/Psych Seizures -, Well Controlled,  PSYCHIATRIC DISORDERS    GI/Hepatic negative GI ROS, Neg liver ROS, neg GERD  ,  Endo/Other  negative endocrine ROS  Renal/GU      Musculoskeletal   Abdominal   Peds  Hematology negative hematology ROS (+)   Anesthesia Other Findings Past Medical History: 11/02/2018: Alcohol abuse No date: Hypertension No date: Seizure [facility])     Comment:  sees dr Sherryll Burger at [facility], no longer has them. 9 years ago.  Past Surgical History: 2019: CERVICAL SPINE SURGERY No date: CHOLECYSTECTOMY     Reproductive/Obstetrics negative OB ROS                             Anesthesia Physical Anesthesia Plan  ASA: 3  Anesthesia Plan: General LMA   Post-op Pain Management:    Induction: Intravenous  PONV Risk Score and Plan: Dexamethasone, Ondansetron, Midazolam and Treatment may vary due to age or medical condition  Airway Management Planned: LMA  Additional Equipment:   Intra-op Plan:   Post-operative Plan: Extubation in OR  Informed Consent: I have reviewed the patients History and Physical, chart, labs and discussed the procedure including the risks, benefits and alternatives for the proposed anesthesia with the patient or authorized representative who has indicated his/her understanding and acceptance.     Dental  Advisory Given  Plan Discussed with: Anesthesiologist, CRNA and Surgeon  Anesthesia Plan Comments: (Patient consented for risks of anesthesia including but not limited to:  - adverse reactions to medications - damage to eyes, teeth, lips or other oral mucosa - nerve damage due to positioning  - sore throat or hoarseness - Damage to heart, brain, nerves, lungs, other parts of body or loss of life  Patient voiced understanding.)        Anesthesia Quick Evaluation

## 2021-04-11 NOTE — Discharge Instructions (Addendum)
Orthopedic discharge instructions: Keep dressing dry and intact. Keep hand elevated above heart level. May shower after dressing removed on postop day 4 (Sunday). Cover sutures with Band-Aids after drying off. Apply ice to affected area frequently. Take ibuprofen 600-800 mg TID OR Aleve 2 tabs BID with meals for 5-7 days, then as necessary. Take ES Tylenol or pain medication as prescribed when needed.  Return for follow-up in 10-14 days or as scheduled.    AMBULATORY SURGERY  DISCHARGE INSTRUCTIONS   The drugs that you were given will stay in your system until tomorrow so for the next 24 hours you should not:  Drive an automobile Make any legal decisions Drink any alcoholic beverage   You may resume regular meals tomorrow.  Today it is better to start with liquids and gradually work up to solid foods.  You may eat anything you prefer, but it is better to start with liquids, then soup and crackers, and gradually work up to solid foods.   Please notify your doctor immediately if you have any unusual bleeding, trouble breathing, redness and pain at the surgery site, drainage, fever, or pain not relieved by medication.    Additional Instructions:        Please contact your physician with any problems or Same Day Surgery at [phone], Monday through Friday 6 am to 4 pm, or [place] at [facility] number at [phone].

## 2021-04-11 NOTE — Op Note (Signed)
04/11/2021  9:21 AM  Patient:   [name] Phillips  Pre-Op Diagnosis:   Right carpal tunnel syndrome.  Post-Op Diagnosis:   Same.  Procedure:   Endoscopic right carpal tunnel release.  Surgeon:   Maryagnes Amos, MD  Assistant:   Frederic Jericho, PA-S  Anesthesia:   General LMA  Findings:   As above.  Complications:   None  EBL:   0 cc  Fluids:   200 cc crystalloid  TT:   15 minutes at 250 mmHg  Drains:   None  Closure:   4-0 Prolene interrupted sutures  Brief Clinical Note:   The patient is a 40 year old female with a history progressively worsening pain and paresthesias to her right hand. Her symptoms have progressed despite medications, activity modification, etc. Her history and examination are consistent with carpal tunnel syndrome, confirmed by EMG. The patient presents at this time for an endoscopic right carpal tunnel release.   Procedure:   The patient was brought into the operating room and lain in the supine position. After adequate general laryngeal mask anesthesia was obtained, the right hand and upper extremity were prepped with ChloraPrep solution before being draped sterilely. Preoperative antibiotics were administered. A timeout was performed to verify the appropriate surgical site before the limb was exsanguinated with an Esmarch and the tourniquet inflated to 250 mmHg.   An approximately 1.5-2 cm incision was made over the volar wrist flexion crease, centered over the palmaris longus tendon. The incision was carried down through the subcutaneous tissues with care taken to identify and protect any neurovascular structures. The distal forearm fascia was penetrated just proximal to the transverse carpal ligament. The soft tissues were released off the superficial and deep surfaces of the distal forearm fascia and this was released proximally for 3-4 cm under direct visualization.  Attention was directed distally. The Therapist, nutritional was passed beneath the transverse  carpal ligament along the ulnar aspect of the carpal tunnel and used to release any adhesions as well as to remove any adherent synovial tissue before first the smaller then the larger of the two dilators were passed beneath the transverse carpal ligament along the ulnar margin of the carpal tunnel. The slotted cannula was introduced and the endoscope was placed into the slotted cannula and the undersurface of the transverse carpal ligament visualized. The distal margin of the transverse carpal ligament was marked by placing a 25-gauge needle percutaneously at Kaplan's cardinal point so that it entered the distal portion of the slotted cannula. Under endoscopic visualization, the transverse carpal ligament was released from proximal to distal using the end-cutting blade. A second pass was performed to ensure complete release of the ligament. The adequacy of release was verified both endoscopically and by palpation using the freer elevator.  The wound was irrigated thoroughly with sterile saline solution before being closed using 4-0 Prolene interrupted sutures. A total of 10 cc of 0.5% plain Sensorcaine was injected in and around the incision before a sterile bulky dressing was applied to the wound. The patient was placed into a volar wrist splint before being awakened, extubated, and returned to the recovery room in satisfactory condition after tolerating the procedure well.

## 2021-04-11 NOTE — H&P (Signed)
History of Present Illness:  [name] Phillips is a 40 y.o. female who presents for evaluation and treatment of her bilateral hand and wrist pain and paresthesias, right more symptomatic than left. The patient notes that the symptoms have been present for many years and developed without any specific cause or injury. She apparently underwent an anterior cervical discectomy and fusion procedure several years ago in [place] which she states provided moderate relief of the symptoms. However, the symptoms began to recur. An EMG was performed by [facility] in 2019 which demonstrated the presence of mild carpal tunnel syndrome bilaterally, according to the patient. She has been trying to manage her symptoms nonsurgically with anti-inflammatory medications, ice, heat, and splinting. However, despite these treatments, her symptoms have worsened over the past 6 months or so, especially in her right hand, prompting her to make this appointment. Her symptoms are aggravated by any repetitive grasping activities as well as with driving. She also has pain at night which awakens her from sleep. 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 She denies any recent reinjury to either hand or wrist.  Current Outpatient Medications:  ALPRAZolam (XANAX) 0.5 MG tablet TAKE ONE TABLET BY MOUTH 3 TIMES DAILY AS NEEDED FOR ANXIETY OR SLEEP   mirtazapine (REMERON) 15 MG tablet Take 15 mg by mouth nightly.   multivitamin tablet Take by mouth.   phenytoin extended (DILANTIN) 100 MG ER capsule Take 1 capsule (100 mg total) by mouth once daily. 30 capsule 0   Allergies:   Varenicline Anxiety   Past Medical History:   Hypertension   Seizures (CMS-HCC)   Past Surgical History: No past surgical history on file.   Family History:   No Known Problems Mother   No Known Problems Father   Social History:   Socioeconomic History:   Marital status: Married    Review of Systems:  A comprehensive 14 point ROS was performed, reviewed, and the pertinent orthopaedic findings are documented in the HPI.  Physical Exam: Vitals:  03/23/21 1154  BP: 122/88  Weight: 86.5 kg (190 lb 12.8 oz)  Height: 162.6 cm (5\' 4" )  PainSc: 4  PainLoc: Wrist   General/Constitutional: The patient appears to be well-nourished, well-developed, and in no acute distress. Neuro/Psych: Normal mood and affect, oriented to person, place and time. Eyes: Non-icteric. Pupils are equal, round, and reactive to light, and exhibit synchronous movement. ENT: Unremarkable. Lymphatic: No palpable adenopathy. Respiratory: Lungs clear to auscultation, Normal chest excursion, No wheezes and Non-labored breathing Cardiovascular: Regular rate and rhythm. No murmurs. and No edema, swelling or tenderness, except as noted in detailed exam. Integumentary: No impressive skin lesions present, except as noted in detailed exam. Musculoskeletal: Unremarkable, except as noted in detailed exam.  Right wrist/hand exam: Skin inspection of the right wrist and hand is unremarkable. No swelling, erythema, ecchymosis, abrasions, or other skin abnormalities are identified. There is no tenderness to palpation over the dorsal or palmar aspects of the wrist or hand. She exhibits full active and passive range of motion of the wrist without any pain or catching. She also is able actively flex and extend all digits fully without any pain or triggering. She is neurovascularly intact to her right hand. She has a positive Phalen's test, but a negative Tinel's test over the carpal tunnel.  X-rays/MRI/Lab data:  AP, lateral, and oblique views of the right hand are obtained. These films demonstrate no evidence of fractures, lytic lesions,  or significant degenerative changes.  AP, lateral, and oblique views of the left hand are obtained. These films demonstrate no evidence of fractures, lytic lesions, or significant  degenerative changes.  Assessment:  Carpal tunnel syndrome, right    Plan: The treatment options were discussed with the patient. In addition, patient educational materials were provided regarding the diagnosis and treatment options. The patient is quite frustrated by her symptoms and function limitations, especially as they pertain to her right hand. Therefore, I have recommended a surgical procedure, specifically an endoscopic right carpal tunnel release. The procedure was discussed with the patient, as were the potential risks (including bleeding, infection, nerve and/or blood vessel injury, persistent or recurrent pain/paresthesias, weakness of grip, need for further surgery, blood clots, strokes, heart attacks and/or arhythmias, pneumonia, etc.) and benefits. The patient states his/her understanding and wishes to proceed. All of the patient's questions and concerns were answered. She can call any time with further concerns. She will follow up post-surgery, routine.   H&P reviewed and patient re-examined. No changes.

## 2021-04-11 NOTE — Anesthesia Postprocedure Evaluation (Signed)
Anesthesia Post Note  Patient: [name] Phillips  Procedure(s) Performed: CARPAL TUNNEL RELEASE ENDOSCOPIC (Right: Wrist)  Patient location during evaluation: PACU Anesthesia Type: General Level of consciousness: awake and alert Pain management: pain level controlled Vital Signs Assessment: post-procedure vital signs reviewed and stable Respiratory status: spontaneous breathing, nonlabored ventilation, respiratory function stable and patient connected to nasal cannula oxygen Cardiovascular status: blood pressure returned to baseline and stable Postop Assessment: no apparent nausea or vomiting Anesthetic complications: no   No notable events documented.   Last Vitals:  Vitals:   04/11/21 1015 04/11/21 1028  BP:  126/75  Pulse:  74  Resp: 14 18  Temp: 36.9 C 36.8 C  SpO2:  100%    Last Pain:  Vitals:   04/11/21 1046  TempSrc:   PainSc: 4                  Cleda Mccreedy Genella Bas

## 2021-04-11 NOTE — Anesthesia Procedure Notes (Signed)
Procedure Name: Intubation Date/Time: 04/11/2021 8:39 AM Performed by: Cheral Bay, CRNA Pre-anesthesia Checklist: Patient identified, Emergency Drugs available, Suction available and Patient being monitored Patient Re-evaluated:Patient Re-evaluated prior to induction Oxygen Delivery Method: Circle system utilized Preoxygenation: Pre-oxygenation with 100% oxygen Induction Type: IV induction Ventilation: Mask ventilation without difficulty LMA: LMA inserted Tube type: Oral Number of attempts: 1 Placement Confirmation: ETT inserted through vocal cords under direct vision, positive ETCO2 and breath sounds checked- equal and bilateral Tube secured with: Tape Dental Injury: Teeth and Oropharynx as per pre-operative assessment

## 2021-04-11 NOTE — Transfer of Care (Signed)
Immediate Anesthesia Transfer of Care Note  Patient: [name] Phillips  Procedure(s) Performed: CARPAL TUNNEL RELEASE ENDOSCOPIC (Right: Wrist)  Patient Location: PACU  Anesthesia Type:General  Level of Consciousness: drowsy  Airway & Oxygen Therapy: Patient Spontanous Breathing and Patient connected to face mask oxygen  Post-op Assessment: Post vital signs: Reviewed and stable  Last Vitals:  Vitals Value Taken Time  BP 94/52 04/11/21 0921  Temp    Pulse 69 04/11/21 0923  Resp 11 04/11/21 0923  SpO2 100 % 04/11/21 0923  Vitals shown include unvalidated device data.  Last Pain:  Vitals:   04/11/21 0721  TempSrc: Temporal  PainSc: 3          Complications: No notable events documented.

## 2021-04-23 DIAGNOSIS — F1120 Opioid dependence, uncomplicated: Secondary | ICD-10-CM | POA: Diagnosis not present

## 2021-04-30 ENCOUNTER — Encounter: Payer: Self-pay | Admitting: Family Medicine

## 2021-05-07 DIAGNOSIS — F1120 Opioid dependence, uncomplicated: Secondary | ICD-10-CM | POA: Diagnosis not present

## 2021-05-10 ENCOUNTER — Encounter: Payer: Self-pay | Admitting: Family Medicine

## 2021-05-10 ENCOUNTER — Other Ambulatory Visit: Payer: Self-pay

## 2021-05-10 ENCOUNTER — Telehealth (INDEPENDENT_AMBULATORY_CARE_PROVIDER_SITE_OTHER): Payer: BC Managed Care – PPO | Admitting: Family Medicine

## 2021-05-10 VITALS — Wt 201.0 lb

## 2021-05-10 DIAGNOSIS — I10 Essential (primary) hypertension: Secondary | ICD-10-CM | POA: Diagnosis not present

## 2021-05-10 DIAGNOSIS — R635 Abnormal weight gain: Secondary | ICD-10-CM

## 2021-05-10 DIAGNOSIS — E669 Obesity, unspecified: Secondary | ICD-10-CM | POA: Diagnosis not present

## 2021-05-10 DIAGNOSIS — R7989 Other specified abnormal findings of blood chemistry: Secondary | ICD-10-CM | POA: Diagnosis not present

## 2021-05-10 DIAGNOSIS — Z6833 Body mass index (BMI) 33.0-33.9, adult: Secondary | ICD-10-CM

## 2021-05-10 DIAGNOSIS — E66811 Obesity, class 1: Secondary | ICD-10-CM | POA: Insufficient documentation

## 2021-05-10 MED ORDER — OZEMPIC (0.25 OR 0.5 MG/DOSE) 2 MG/1.5ML ~~LOC~~ SOPN: 0.2500 mg | PEN_INJECTOR | SUBCUTANEOUS | 1 refills | Status: DC

## 2021-05-10 MED ORDER — PHENTERMINE HCL 15 MG PO CAPS: 15.0000 mg | ORAL_CAPSULE | ORAL | 0 refills | Status: DC

## 2021-05-10 NOTE — Assessment & Plan Note (Addendum)
Slightly elevated a few months ago. No vision changes or lactation. Has had weight gain. Will repeat and if still high plan for additional work-up - including MRI of the pituitary. May be 2/2 to zoloft

## 2021-05-10 NOTE — Assessment & Plan Note (Signed)
Reports normal readings at home. Cont bisoprolol-hctz 5-6.25. In-person visit for next appointment if phentermine is the agent used.

## 2021-05-10 NOTE — Assessment & Plan Note (Signed)
Pt with weight gain in the setting of daily exercise and low calorie/healthy diet. Discussed trying ozempic as lower risk medication and effective. However, if not covered by insurance would recommend phentermine though discussed need for more frequent visits and risk on bp. She will check with insurance about medications covered, ozempic sent and side effects discussed. Return 4 weeks for weight and BP check

## 2021-05-10 NOTE — Progress Notes (Signed)
I connected with [name] Phillips on 05/10/21 at 12:00 PM EDT by video and verified that I am speaking with the correct person using two identifiers.   I discussed the limitations, risks, security and privacy concerns of performing an evaluation and management service by video and the availability of in person appointments. I also discussed with the patient that there may be a patient responsible charge related to this service. The patient expressed understanding and agreed to proceed.  Patient location: Home Provider Location: [facility] [place] Participants: Lynnda Child and [name] Phillips   Subjective:     [name] Phillips is a 40 y.o. female presenting for Obesity (Wants to discuss options to lose weight /Waist circumference: 44")     HPI  #Weight gain - has gained 20 lbs - even though she has been dieting - no alcohol - increased water - no "fun food" - once a week - does not count calories  - took a OTC water pill to see if that was the cause - walking daily 2 miles - 20-30 minutes, doing some PT exercises - recent arm surgery - can feel the weight - causing back pain and stress  Bp 128/61, has been well controlled   Review of Systems   Social History   Tobacco Use  Smoking Status Former   Packs/day: 1.00   Years: 25.00   Pack years: 25.00   Types: Cigarettes, E-cigarettes   Quit date: 11/20/2019   Years since quitting: 1.4  Smokeless Tobacco Never  Tobacco Comments   suplementing with e-cig to help fully quit        Objective:   BP Readings from Last 3 Encounters:  04/11/21 126/75  02/15/21 136/84  10/18/20 (!) 140/98   Wt Readings from Last 3 Encounters:  05/10/21 201 lb (91.2 kg)  02/15/21 179 lb (81.2 kg)  10/18/20 182 lb (82.6 kg)   Wt 201 lb (91.2 kg)   BMI 33.45 kg/m   Physical Exam Constitutional:      Appearance: Normal appearance. She is not ill-appearing.  HENT:     Head: Normocephalic and atraumatic.     Right Ear:  External ear normal.     Left Ear: External ear normal.  Eyes:     Conjunctiva/sclera: Conjunctivae normal.  Pulmonary:     Effort: Pulmonary effort is normal. No respiratory distress.  Neurological:     Mental Status: She is alert. Mental status is at baseline.  Psychiatric:        Mood and Affect: Mood normal.        Behavior: Behavior normal.        Thought Content: Thought content normal.        Judgment: Judgment normal.           Assessment & Plan:   Problem List Items Addressed This Visit       Cardiovascular and Mediastinum   HTN (hypertension)    Reports normal readings at home. Cont bisoprolol-hctz 5-6.25. In-person visit for next appointment if phentermine is the agent used.         Other   Class 1 obesity with serious comorbidity and body mass index (BMI) of 33.0 to 33.9 in adult - Primary    Pt with weight gain in the setting of daily exercise and low calorie/healthy diet. Discussed trying ozempic as lower risk medication and effective. However, if not covered by insurance would recommend phentermine though discussed need for more frequent visits and  risk on bp. She will check with insurance about medications covered, ozempic sent and side effects discussed. Return 4 weeks for weight and BP check      Relevant Medications   Semaglutide,0.25 or 0.5MG /DOS, (OZEMPIC, 0.25 OR 0.5 MG/DOSE,) 2 MG/1.5ML SOPN   Elevated prolactin level    Slightly elevated a few months ago. No vision changes or lactation. Has had weight gain. Will repeat and if still high plan for additional work-up - including MRI of the pituitary. May be 2/2 to zoloft      Relevant Orders   Prolactin   Other Visit Diagnoses     Weight gain       Relevant Medications   Semaglutide,0.25 or 0.5MG /DOS, (OZEMPIC, 0.25 OR 0.5 MG/DOSE,) 2 MG/1.5ML SOPN   Other Relevant Orders   TSH        Return in about 4 weeks (around 06/07/2021) for with another provider.  Lynnda Child, MD

## 2021-05-21 DIAGNOSIS — F11159 Opioid abuse with opioid-induced psychotic disorder, unspecified: Secondary | ICD-10-CM | POA: Diagnosis not present

## 2021-06-04 ENCOUNTER — Other Ambulatory Visit: Payer: Self-pay | Admitting: Family Medicine

## 2021-06-04 DIAGNOSIS — E669 Obesity, unspecified: Secondary | ICD-10-CM

## 2021-06-04 DIAGNOSIS — F1120 Opioid dependence, uncomplicated: Secondary | ICD-10-CM | POA: Diagnosis not present

## 2021-06-05 NOTE — Telephone Encounter (Signed)
Called patient and [name] Phillips and schedule apt

## 2021-06-11 NOTE — Telephone Encounter (Signed)
CALLED PATIENT AND GOT HER SCHEDULED FOR TOMORROW @3 :40 PM

## 2021-06-12 ENCOUNTER — Other Ambulatory Visit: Payer: Self-pay

## 2021-06-12 ENCOUNTER — Encounter: Payer: Self-pay | Admitting: Family

## 2021-06-12 ENCOUNTER — Ambulatory Visit (INDEPENDENT_AMBULATORY_CARE_PROVIDER_SITE_OTHER): Payer: BC Managed Care – PPO | Admitting: Family

## 2021-06-12 VITALS — BP 122/74 | HR 74 | Temp 97.3°F | Ht 65.0 in | Wt 195.0 lb

## 2021-06-12 DIAGNOSIS — F1111 Opioid abuse, in remission: Secondary | ICD-10-CM

## 2021-06-12 DIAGNOSIS — F411 Generalized anxiety disorder: Secondary | ICD-10-CM

## 2021-06-12 DIAGNOSIS — R635 Abnormal weight gain: Secondary | ICD-10-CM | POA: Diagnosis not present

## 2021-06-12 DIAGNOSIS — Z6833 Body mass index (BMI) 33.0-33.9, adult: Secondary | ICD-10-CM

## 2021-06-12 DIAGNOSIS — R7989 Other specified abnormal findings of blood chemistry: Secondary | ICD-10-CM | POA: Diagnosis not present

## 2021-06-12 DIAGNOSIS — R6889 Other general symptoms and signs: Secondary | ICD-10-CM

## 2021-06-12 DIAGNOSIS — R768 Other specified abnormal immunological findings in serum: Secondary | ICD-10-CM

## 2021-06-12 DIAGNOSIS — E669 Obesity, unspecified: Secondary | ICD-10-CM

## 2021-06-12 DIAGNOSIS — E66811 Obesity, class 1: Secondary | ICD-10-CM

## 2021-06-12 DIAGNOSIS — M2550 Pain in unspecified joint: Secondary | ICD-10-CM | POA: Diagnosis not present

## 2021-06-12 DIAGNOSIS — I10 Essential (primary) hypertension: Secondary | ICD-10-CM

## 2021-06-12 NOTE — Progress Notes (Signed)
Established Patient Office Visit  Subjective:  Patient ID: [name] Phillips, female    DOB: [birth year]  Age: 40 y.o. MRN: [id number]  CC:  Chief Complaint  Patient presents with   Medication Refill    HPI [name] Phillips is here today for f/u.  She started phentermine 15 mg October 26th, she has been taking daily but currently five days without prescription. She didn't really notice any weight change, did not have any cp/palp/and or sob.   C/o weight gain over the last five months, about thirty pounds. Denies fatigue. No dry skin. No thinning hair. Joint pains, bil ankles. Stiffness in the am less than one hour.   Hot flashes, seems profuse and drips down her back. Heat intolerance. No change of pregnancy. No recent period because of the birth control. Takes constantly, as she was having very heavy periods.  Filed Weights   06/12/21 1557  Weight: 195 lb (88.5 kg)        Past Medical History:  Diagnosis Date   Alcohol abuse 11/02/2018   Hypertension    Seizure [facility])    [facility], no longer has them. 9 years ago.    Past Surgical History:  Procedure Laterality Date   CARPAL TUNNEL RELEASE Right 04/11/2021   Procedure: CARPAL TUNNEL RELEASE ENDOSCOPIC;  Surgeon: Corky Mull, MD;  Location: [facility] ORS;  Service: Orthopedics;  Laterality: Right;  Pateint requesting 1st case of the day   CERVICAL SPINE SURGERY  2019   CHOLECYSTECTOMY      Family History  Problem Relation Age of Onset   Alcohol abuse Other    Hyperlipidemia Mother    Hypertension Mother    COPD Father    Heart disease Father    Hypertension Father    Breast cancer Maternal Aunt 47    Social History   Socioeconomic History   Marital status: Married    Spouse name: Ovid Curd   Number of children: 3   Years of education: college   Highest education level: Not on file  Occupational History   Occupation: Financial trader: [employer]  Tobacco Use   Smoking status:  Former    Packs/day: 1.00    Years: 25.00    Pack years: 25.00    Types: Cigarettes, E-cigarettes    Quit date: 11/20/2019    Years since quitting: 1.5   Smokeless tobacco: Never   Tobacco comments:    suplementing with e-cig to help fully quit  Vaping Use   Vaping Use: Some days   Start date: 11/20/2019   Devices: [place]  Substance and Sexual Activity   Alcohol use: Not Currently   Drug use: Not Currently   Sexual activity: Yes    Birth control/protection: Pill  Other Topics Concern   Not on file  Social History Narrative   10/18/20   From: the area ([place] originally)   Living: with Nathan, 2004   Work: Secondary school teacher      Family: 3 adult children - [place], [place], and [place]      Enjoys: relax, camping      Exercise: not currently   Diet: pretty good, limits sweets, limiting bread, diet soda      Safety   Seat belts: Yes    Guns: Yes  and secure   Safe in relationships: Yes    Social Determinants of Health   Financial Resource Strain: Not on file  Food Insecurity: Not on file  Transportation Needs: Not on file  Physical Activity: Not on file  Stress: Not on file  Social Connections: Not on file  Intimate Partner Violence: Not on file    Outpatient Medications Prior to Visit  Medication Sig Dispense Refill   bisoprolol-hydrochlorothiazide (ZIAC) 5-6.25 MG tablet Take 1 tablet by mouth daily. 90 tablet 1   Buprenorphine HCl-Naloxone HCl 12-3 MG FILM Place 1 Film under the tongue daily.     cetirizine (ZYRTEC) 10 MG tablet Take 10 mg by mouth daily.     Melatonin 12 MG TABS Take 3 mg by mouth at bedtime.     Multiple Vitamin (MULTI-VITAMIN) tablet Take 1 tablet by mouth daily.     naproxen sodium (ALEVE) 220 MG tablet Take 220 mg by mouth daily as needed (pain).     norethindrone (MICRONOR) 0.35 MG tablet Take 1 tablet (0.35 mg total) by mouth daily. 90 tablet 1   omeprazole (PRILOSEC) 20 MG capsule Take 20 mg by mouth daily.     sertraline (ZOLOFT) 100 MG  tablet TAKE 1 TABLET BY MOUTH DAILY. 90 tablet 1   phentermine 15 MG capsule Take 1 capsule (15 mg total) by mouth every morning. 30 capsule 0   ketotifen (ZADITOR) 0.025 % ophthalmic solution Place 1 drop into both eyes daily as needed (allergies).     Semaglutide,0.25 or 0.5MG /DOS, (OZEMPIC, 0.25 OR 0.5 MG/DOSE,) 2 MG/1.5ML SOPN Inject 0.25 mg into the skin once a week. 1.5 mL 1   No facility-administered medications prior to visit.    Allergies  Allergen Reactions   Wellbutrin [Bupropion]     Suicidal thoughts    Chantix [Varenicline Tartrate] Anxiety    ROS Review of Systems  Constitutional:  Positive for fatigue and unexpected weight change (weight gain). Negative for chills and fever.  Respiratory:  Negative for cough and shortness of breath.   Cardiovascular:  Negative for chest pain and palpitations.  Endocrine: Positive for heat intolerance (hot flashes, finds she is sweating profusely throughout the day).  Musculoskeletal:  Positive for arthralgias (bil ankles stiffness in am less than one hour).     Objective:    Physical Exam Constitutional:      General: She is not in acute distress.    Appearance: Normal appearance. She is obese. She is not ill-appearing.  HENT:     Head: Normocephalic.     Mouth/Throat:     Mouth: Mucous membranes are moist.  Cardiovascular:     Rate and Rhythm: Normal rate and regular rhythm.  Pulmonary:     Effort: Pulmonary effort is normal.     Breath sounds: Normal breath sounds.  Abdominal:     General: Abdomen is flat.  Musculoskeletal:        General: Normal range of motion.     Cervical back: Normal range of motion.  Skin:    General: Skin is warm.  Neurological:     General: No focal deficit present.     Mental Status: She is alert and oriented to person, place, and time.  Psychiatric:        Mood and Affect: Mood normal.        Behavior: Behavior normal.        Thought Content: Thought content normal.        Judgment:  Judgment normal.    BP 122/74   Pulse 74   Temp (!) 97.3 F (36.3 C) (Temporal)   Ht 5\' 5"  (1.651 m)   Wt 195 lb (88.5  kg)   SpO2 98%   BMI 32.45 kg/m  Wt Readings from Last 3 Encounters:  06/12/21 195 lb (88.5 kg)  05/10/21 201 lb (91.2 kg)  02/15/21 179 lb (81.2 kg)     Health Maintenance Due  Topic Date Due   COVID-19 Vaccine (1) Never done   INFLUENZA VACCINE  02/12/2021    There are no preventive care reminders to display for this patient.  Lab Results  Component Value Date   TSH 4.16 02/15/2021   Lab Results  Component Value Date   WBC 6.6 02/15/2021   HGB 13.0 02/15/2021   HCT 38.3 02/15/2021   MCV 94.7 02/15/2021   PLT 331.0 02/15/2021   Lab Results  Component Value Date   NA 135 02/15/2021   K 4.8 04/06/2021   CO2 27 02/15/2021   GLUCOSE 92 02/15/2021   BUN 13 02/15/2021   CREATININE 0.72 02/15/2021   BILITOT 0.7 02/15/2021   ALKPHOS 96 02/15/2021   AST 44 (H) 02/15/2021   ALT 42 (H) 02/15/2021   PROT 7.3 02/15/2021   ALBUMIN 4.6 02/15/2021   CALCIUM 10.2 02/15/2021   ANIONGAP 14 11/01/2018   GFR 103.41 02/15/2021   Lab Results  Component Value Date   CHOL 234 (H) 02/15/2021   Lab Results  Component Value Date   HDL 60.50 02/15/2021   Lab Results  Component Value Date   LDLCALC 101 (H) 02/06/2017   Lab Results  Component Value Date   TRIG 269.0 (H) 02/15/2021   Lab Results  Component Value Date   CHOLHDL 4 02/15/2021   Lab Results  Component Value Date   HGBA1C 4.8 07/21/2012      Assessment & Plan:   Problem List Items Addressed This Visit       Cardiovascular and Mediastinum   HTN (hypertension)    Patient to continue medication as prescribed and monitor blood pressure as necessary.  While she is taking the phentermine I advised her to take the blood pressure at least twice daily about 5 to 10 minutes after sitting down.  Patient report any high blood pressure chest pain palpitations and no shortness of breath.   Low-sodium diet advised        Other   Generalized anxiety disorder    Patient stable on Zoloft daily and doing well.  Denies SI and/or HI.      HCV antibody positive    We will repeat pending results.      Class 1 obesity with serious comorbidity and body mass index (BMI) of 33.0 to 33.9 in adult    In regards to prescription we will increase to 37.5 mg daily.  If any chest pain palpitations and shortness of breath please stop medication immediately and notify the office and/or call 911 if there is severe.  We discussed proper eating techniques and exercises well medication therapy.  We will also recent weight gain..  Labs ordered pending results      Elevated prolactin level    Repeat prolactin level today pending results.  If endocrinology      Relevant Orders   Prolactin (Completed)   Opioid abuse, in remission ([place])   Weight gain    Labs ordered and pending results.  Discussed exercise and dietary changes that can be utilized.      Relevant Orders   POCT urine pregnancy   Thyroid Peroxidase Antibodies (TPO) ([facility])   T3, free   T4, free   TSH   Intolerance to heat -  Primary    Labs ordered pending results.      Polyarthralgia    Labs ordered for possible autoimmune and/or inflammatory etiologies.      Relevant Orders   C-reactive protein   Sedimentation rate   Rheumatoid factor (Completed)   ANA   Elevated liver function tests    We will repeat liver function test today pending results.  Advised patient to go easy on Tylenol and ibuprofen if she takes them.  Patient aware not to drink alcohol.  Herbal supplements can also increase liver function tests.      Relevant Orders   Hepatitis panel, acute    No orders of the defined types were placed in this encounter.   Follow-up: Return in about 1 month (around 07/12/2021).    Mort Sawyers, FNP

## 2021-06-12 NOTE — Patient Instructions (Addendum)
You have bene prescribed a controlled substance today that may increase your blood pressure. Please monitor daily every am and every night. Please report any elevations. If any cp palp and or sob please notify me immediately and or call 911.  Stop by the lab prior to leaving today. I will notify you of your results once received.   It was a pleasure seeing you today! Please do not hesitate to reach out with any questions and or concerns.  Regards,   Mort Sawyers

## 2021-06-13 DIAGNOSIS — R635 Abnormal weight gain: Secondary | ICD-10-CM | POA: Insufficient documentation

## 2021-06-13 DIAGNOSIS — R7989 Other specified abnormal findings of blood chemistry: Secondary | ICD-10-CM | POA: Insufficient documentation

## 2021-06-13 DIAGNOSIS — R6889 Other general symptoms and signs: Secondary | ICD-10-CM | POA: Insufficient documentation

## 2021-06-13 DIAGNOSIS — M2550 Pain in unspecified joint: Secondary | ICD-10-CM | POA: Insufficient documentation

## 2021-06-13 LAB — T4, FREE: Free T4: 0.66 ng/dL (ref 0.60–1.60)

## 2021-06-13 LAB — T3, FREE: T3, Free: 3.9 pg/mL (ref 2.3–4.2)

## 2021-06-13 LAB — TSH: TSH: 2.67 u[IU]/mL (ref 0.35–5.50)

## 2021-06-13 LAB — C-REACTIVE PROTEIN: CRP: <1 mg/dL (ref 0.5–20.0)

## 2021-06-13 LAB — THYROID PEROXIDASE ANTIBODIES (TPO) (REFL): Thyroperoxidase Ab SerPl-aCnc: <1 IU/mL (ref <9)

## 2021-06-13 LAB — SEDIMENTATION RATE: Sed Rate: 19 mm/hr (ref 0–20)

## 2021-06-13 MED ORDER — PHENTERMINE HCL 37.5 MG PO CAPS
37.5000 mg | ORAL_CAPSULE | ORAL | 0 refills | Status: DC
Start: 2021-06-13 — End: 2021-07-12

## 2021-06-13 NOTE — Assessment & Plan Note (Signed)
Labs ordered and pending results.  Discussed exercise and dietary changes that can be utilized.

## 2021-06-13 NOTE — Assessment & Plan Note (Signed)
We will repeat liver function test today pending results.  Advised patient to go easy on Tylenol and ibuprofen if she takes them.  Patient aware not to drink alcohol.  Herbal supplements can also increase liver function tests.

## 2021-06-13 NOTE — Assessment & Plan Note (Signed)
We will repeat pending results.

## 2021-06-13 NOTE — Assessment & Plan Note (Signed)
In regards to prescription we will increase to 37.5 mg daily.  If any chest pain palpitations and shortness of breath please stop medication immediately and notify the office and/or call 911 if there is severe.  We discussed proper eating techniques and exercises well medication therapy.  We will also recent weight gain..  Labs ordered pending results

## 2021-06-13 NOTE — Assessment & Plan Note (Signed)
Labs ordered pending results 

## 2021-06-13 NOTE — Assessment & Plan Note (Signed)
Patient to continue medication as prescribed and monitor blood pressure as necessary.  While she is taking the phentermine I advised her to take the blood pressure at least twice daily about 5 to 10 minutes after sitting down.  Patient report any high blood pressure chest pain palpitations and no shortness of breath.  Low-sodium diet advised

## 2021-06-13 NOTE — Assessment & Plan Note (Signed)
Labs ordered for possible autoimmune and/or inflammatory etiologies.

## 2021-06-13 NOTE — Assessment & Plan Note (Signed)
Patient stable on Zoloft daily and doing well.  Denies SI and/or HI.

## 2021-06-13 NOTE — Assessment & Plan Note (Signed)
Repeat prolactin level today pending results.  If endocrinology

## 2021-06-15 LAB — RHEUMATOID FACTOR: Rheumatoid fact SerPl-aCnc: <14 IU/mL (ref <14)

## 2021-06-15 LAB — PROLACTIN: Prolactin: 17.8 ng/mL

## 2021-06-15 LAB — HEPATITIS PANEL, ACUTE
Hep A IgM: NONREACTIVE
Hep B C IgM: NONREACTIVE
Hepatitis B Surface Ag: NONREACTIVE
Hepatitis C Ab: REACTIVE — AB
SIGNAL TO CUT-OFF: 1.57 — ABNORMAL HIGH (ref <1.00)

## 2021-06-15 LAB — HCV RNA,QUANTITATIVE REAL TIME PCR
HCV Quantitative Log: <1.18 Log IU/mL — AB
HCV RNA, PCR, QN: <15 IU/mL — AB

## 2021-06-15 LAB — ANA: Anti Nuclear Antibody (ANA): NEGATIVE

## 2021-06-18 ENCOUNTER — Encounter: Payer: Self-pay | Admitting: Family

## 2021-06-18 ENCOUNTER — Other Ambulatory Visit: Payer: Self-pay | Admitting: Family

## 2021-06-18 DIAGNOSIS — F1120 Opioid dependence, uncomplicated: Secondary | ICD-10-CM | POA: Diagnosis not present

## 2021-06-18 DIAGNOSIS — B1920 Unspecified viral hepatitis C without hepatic coma: Secondary | ICD-10-CM

## 2021-07-12 ENCOUNTER — Encounter: Payer: Self-pay | Admitting: Family

## 2021-07-12 ENCOUNTER — Telehealth (INDEPENDENT_AMBULATORY_CARE_PROVIDER_SITE_OTHER): Payer: BC Managed Care – PPO | Admitting: Family

## 2021-07-12 ENCOUNTER — Other Ambulatory Visit: Payer: Self-pay

## 2021-07-12 ENCOUNTER — Telehealth: Payer: Self-pay

## 2021-07-12 ENCOUNTER — Encounter: Payer: Self-pay | Admitting: *Deleted

## 2021-07-12 VITALS — BP 128/82 | HR 82 | Ht 65.0 in | Wt 192.4 lb

## 2021-07-12 DIAGNOSIS — I10 Essential (primary) hypertension: Secondary | ICD-10-CM

## 2021-07-12 DIAGNOSIS — Z3041 Encounter for surveillance of contraceptive pills: Secondary | ICD-10-CM

## 2021-07-12 DIAGNOSIS — R7989 Other specified abnormal findings of blood chemistry: Secondary | ICD-10-CM | POA: Diagnosis not present

## 2021-07-12 DIAGNOSIS — R635 Abnormal weight gain: Secondary | ICD-10-CM | POA: Diagnosis not present

## 2021-07-12 DIAGNOSIS — R61 Generalized hyperhidrosis: Secondary | ICD-10-CM

## 2021-07-12 MED ORDER — NORETHINDRONE 0.35 MG PO TABS: 1.0000 | ORAL_TABLET | Freq: Every day | ORAL | 1 refills | Status: DC

## 2021-07-12 MED ORDER — PHENTERMINE HCL 37.5 MG PO CAPS: 37.5000 mg | ORAL_CAPSULE | ORAL | 0 refills | Status: DC

## 2021-07-12 NOTE — Patient Instructions (Addendum)
A referral was placed today for endocrinology as well as hepatologist.   Please let us know if you have not heard back within 1 week about your referral.  A diagnostic test was ordered for today, and requested to be performed at [facility].  I have sent the order over to the facility.  Please give this center a call, and schedule your appointment to have this test completed. Once results are received, I will be in touch.   Please do not hesitate to reach out with any questions and or concerns.  Regards,   Mort Sawyers

## 2021-07-12 NOTE — Telephone Encounter (Signed)
Patient is requesting a transfer from Dr. Selena Batten to [place], do you approve? If so we will contact her to set up for a TOC"

## 2021-07-12 NOTE — Progress Notes (Signed)
MyChart Video Visit    Virtual Visit via Video Note   This visit type was conducted due to national recommendations for restrictions regarding the COVID-19 Pandemic (e.g. social distancing) in an effort to limit this patient's exposure and mitigate transmission in our community. This patient is at least at moderate risk for complications without adequate follow up. This format is felt to be most appropriate for this patient at this time. Physical exam was limited by quality of the video and audio technology used for the visit. CMA was able to get the patient set up on a video visit.  Patient location: Home. Patient and provider in visit Provider location: Office  I discussed the limitations of evaluation and management by telemedicine and the availability of in person appointments. The patient expressed understanding and agreed to proceed.  Visit Date: 07/12/2021  Today's healthcare provider: Mort Sawyers, FNP     Subjective:    Patient ID: [name] Phillips, female    DOB: 1981-01-11, 40 y.o.   MRN: [id number]  Chief Complaint  Patient presents with   Weight Check    HPI  40 y/o female here for f/u.   Still sweating profusely, has had this issue even before starting phentermine. Prolactin has returned to normal after last lab draw. She is taking bisoprolol (beta blocker), and sertraline which could be possible considerations in increased sweat production. Possibly even norethindrone. FSH wnl in 8/22. TSH wnl. At this point, may consider endo consult, d/w pt.  Lab Results  Component Value Date   TSH 2.67 06/12/2021   Lab Results  Component Value Date   WBC 6.6 02/15/2021   HGB 13.0 02/15/2021   HCT 38.3 02/15/2021   MCV 94.7 02/15/2021   PLT 331.0 02/15/2021   Questionable Hepatitis C results: HCV RNA, PCR QN detected as well as HCV quant. Pt referred to hepatology which she has not heard back from . She does have details however from referral team, and she plans to  call to make an appt. Denies abd pain. Rare nsaids/tylenol use. Completely abstained from alcohol , not currently taking any herbal supplements. Last abdominal imaging CT abd/pelvis 2015 with suggestion of fatty liver disease.   Hepatitis Latest Ref Rng & Units 06/12/2021  Hep B Surface Ag NON-REACTIVE NON-REACTIVE  Hep B IgM NON-REACTIVE NON-REACTIVE  Hep C Ab NON-REACTIVE REACTIVE(A)  Hep C Ab NON-REACTIVE REACTIVE(A)  Hep A IgM NON-REACTIVE NON-REACTIVE     Weight loss: a little frustrated because over the last one month has only lost three pounds, and has increased phentermine to 37.5 . She is not currently calorie counting but she is watching her intake of food. Treadmill and or exercise bike five days a week at work.She has lost a total of 11 pounds since 10/22.  Wt Readings from Last 3 Encounters:  07/12/21 192 lb 6 oz (87.3 kg)  06/12/21 195 lb (88.5 kg)  05/10/21 201 lb (91.2 kg)     Past Medical History:  Diagnosis Date   Alcohol abuse 11/02/2018   Hypertension    Seizure [facility])    [facility], no [facility] them. 9 years ago.    Past Surgical History:  Procedure Laterality Date   CARPAL TUNNEL RELEASE Right 04/11/2021   Procedure: CARPAL TUNNEL RELEASE ENDOSCOPIC;  Surgeon: Christena Flake, MD;  Location: [facility] ORS;  Service: Orthopedics;  Laterality: Right;  Pateint requesting 1st case of the day   CERVICAL SPINE SURGERY  2019  CHOLECYSTECTOMY      Family History  Problem Relation Age of Onset   Alcohol abuse Other    Hyperlipidemia Mother    Hypertension Mother    COPD Father    Heart disease Father    Hypertension Father    Breast cancer Maternal Aunt 15    Social History   Socioeconomic History   Marital status: Married    Spouse name: Harrold Donath   Number of children: 3   Years of education: college   Highest education level: Not on file  Occupational History   Occupation: Geophysicist/field seismologist: [employer]  Tobacco Use   Smoking  status: Former    Packs/day: 1.00    Years: 25.00    Pack years: 25.00    Types: Cigarettes, E-cigarettes    Quit date: 11/20/2019    Years since quitting: 1.6   Smokeless tobacco: Never   Tobacco comments:    suplementing with e-cig to help fully quit  Vaping Use   Vaping Use: Some days   Start date: 11/20/2019   Devices: [place]  Substance and Sexual Activity   Alcohol use: Not Currently   Drug use: Not Currently   Sexual activity: Yes    Birth control/protection: Pill  Other Topics Concern   Not on file  Social History Narrative   10/18/20   From: the area ([place] originally[place]   Living: with Harrold Donath, 2004   Work: Investment banker, corporate      Family: 3 adult children - [place], [place], and [place]      Enjoys: relax, camping      Exercise: not currently   Diet: pretty good, limits sweets, limiting bread, diet soda      Safety   Seat belts: Yes    Guns: Yes  and secure   Safe in relationships: Yes    Social Determinants of Corporate investment banker Strain: Not on file  Food Insecurity: Not on file  Transportation Needs: Not on file  Physical Activity: Not on file  Stress: Not on file  Social Connections: Not on file  Intimate Partner Violence: Not on file    Outpatient Medications Prior to Visit  Medication Sig Dispense Refill   bisoprolol-hydrochlorothiazide (ZIAC) 5-6.25 MG tablet Take 1 tablet by mouth daily. 90 tablet 1   Buprenorphine HCl-Naloxone HCl 12-3 MG FILM Place 1 Film under the tongue daily.     cetirizine (ZYRTEC) 10 MG tablet Take 10 mg by mouth daily.     Melatonin 12 MG TABS Take 3 mg by mouth at bedtime.     Multiple Vitamin (MULTI-VITAMIN) tablet Take 1 tablet by mouth daily.     naproxen sodium (ALEVE) 220 MG tablet Take 220 mg by mouth daily as needed (pain).     omeprazole (PRILOSEC) 20 MG capsule Take 20 mg by mouth daily.     sertraline (ZOLOFT) 100 MG tablet TAKE 1 TABLET BY MOUTH DAILY. 90 tablet 1   norethindrone (MICRONOR) 0.35 MG tablet Take  1 tablet (0.35 mg total) by mouth daily. 90 tablet 1   phentermine 37.5 MG capsule Take 1 capsule (37.5 mg total) by mouth every morning. 30 capsule 0   No facility-administered medications prior to visit.    Allergies  Allergen Reactions   Wellbutrin [Bupropion]     Suicidal thoughts    Chantix [Varenicline Tartrate] Anxiety    Review of Systems  Constitutional:  Positive for diaphoresis (excessive sweating, chronic). Negative for chills and fever.  Respiratory:  Negative for cough, sputum production and shortness of breath.   Cardiovascular:  Negative for chest pain and palpitations.  Skin:  Negative for rash.  Neurological:  Negative for dizziness and headaches.  Psychiatric/Behavioral:  Negative for depression, substance abuse and suicidal ideas.       Objective:    Physical Exam Constitutional:      General: She is not in acute distress.    Appearance: Normal appearance. She is obese. She is not ill-appearing, toxic-appearing or diaphoretic.  HENT:     Head: Normocephalic.  Pulmonary:     Effort: Pulmonary effort is normal.  Neurological:     General: No focal deficit present.     Mental Status: She is alert. Mental status is at baseline. She is disoriented.  Psychiatric:        Mood and Affect: Mood normal.        Behavior: Behavior normal.        Thought Content: Thought content normal.        Judgment: Judgment normal.    BP 128/82    Pulse 82    Ht 5\' 5"  (1.651 m)    Wt 192 lb 6 oz (87.3 kg)    SpO2 99%    BMI 32.01 kg/m  Wt Readings from Last 3 Encounters:  07/12/21 192 lb 6 oz (87.3 kg)  06/12/21 195 lb (88.5 kg)  05/10/21 201 lb (91.2 kg)       Assessment & Plan:   Problem List Items Addressed This Visit       Cardiovascular and Mediastinum   HTN (hypertension)    Patient to continue monitoring closely her blood pressure with a blood pressure cuff at home as directed.        Musculoskeletal and Integument   Hyperhidrosis    Lab work up has  been negative, have considered some potential medications that may be interfering and/or increasing hyperhidrosis and have discussed with patient on options as far as discontinuing however patient is hesitant to at this time.  Will refer to endocrinologist for further evaluation and treatment.  Suspect that this may be possibly hormonal      Relevant Orders   Ambulatory referral to Endocrinology     Other   Weight gain - Primary    PDMP reviewed no suspicious activity patient to continue with phentermine 37.5 mg once p.o. daily have advised patient that this will be the last month of this medication.  Patient verbalized understanding.  Also advised patient to continue with ongoing exercise as well as diet management recommended daily calorie count as well with my fitness pal.  Have referred patient to weight management as well referral placed have advised patient if she does not hear anything in the next 1 week to please let me know      Relevant Medications   phentermine 37.5 MG capsule   Other Relevant Orders   Amb Ref to Medical Weight Management   Ambulatory referral to Endocrinology   Elevated liver function tests    Positive hepatitis C panels have referred patient to hepatologist patient is to call and make appointment as she has information from our referral team.  In the meantime have ordered an ultrasound of the abdomen as last diagnostic imaging of the liver was in 2015 with a CT scan.  Pending results have advised patient to limit and/or not take ibuprofen and/or Tylenol, and/or over supplements.  Patient is in alcohol remission so she is not currently ingesting  any alcohol.      Encounter for surveillance of contraceptive pills    Discussed with patient that poss potentially this may be causing some of her sweating patient hesitant to discontinue as this has helped her with mood improvement.  She has not had a period in over 3 years because of this medication.  Will refill for now  patient states no chance of pregnancy.      Relevant Medications   norethindrone (MICRONOR) 0.35 MG tablet   Other Visit Diagnoses     Abnormal liver function test       Relevant Orders   US Abdomen Complete       I am having Ajah A. Gresham maintain her Multi-Vitamin, omeprazole, bisoprolol-hydrochlorothiazide, Melatonin, naproxen sodium, cetirizine, Buprenorphine HCl-Naloxone HCl, sertraline, phentermine, and norethindrone.  Meds ordered this encounter  Medications   phentermine 37.5 MG capsule    Sig: Take 1 capsule (37.5 mg total) by mouth every morning.    Dispense:  30 capsule    Refill:  0    Order Specific Question:   Supervising Provider    Answer:   BEDSOLE, AMY E [[id number]]   norethindrone (MICRONOR) 0.35 MG tablet    Sig: Take 1 tablet (0.35 mg total) by mouth daily.    Dispense:  90 tablet    Refill:  1    Order Specific Question:   Supervising Provider    Answer:   BEDSOLE, AMY E [[id number]]    I discussed the assessment and treatment plan with the patient. The patient was provided an opportunity to ask questions and all were answered. The patient agreed with the plan and demonstrated an understanding of the instructions.   The patient was advised to call back or seek an in-person evaluation if the symptoms worsen or if the condition fails to improve as anticipated.  I provided 45 minutes of face-to-face time during this encounter.   Mort Sawyers, FNP [facility] [place] [phone] (phone) [phone] (fax)  [facility]

## 2021-07-12 NOTE — Telephone Encounter (Signed)
I don't believe that Dr. Selena Batten will have a problem with the TOC. Approved.

## 2021-07-13 ENCOUNTER — Encounter: Payer: Self-pay | Admitting: Family

## 2021-07-13 DIAGNOSIS — R61 Generalized hyperhidrosis: Secondary | ICD-10-CM | POA: Insufficient documentation

## 2021-07-13 DIAGNOSIS — Z3041 Encounter for surveillance of contraceptive pills: Secondary | ICD-10-CM | POA: Insufficient documentation

## 2021-07-13 NOTE — Assessment & Plan Note (Signed)
Patient to continue monitoring closely her blood pressure with a blood pressure cuff at home as directed.

## 2021-07-13 NOTE — Assessment & Plan Note (Signed)
Discussed with patient that poss potentially this may be causing some of her sweating patient hesitant to discontinue as this has helped her with mood improvement.  She has not had a period in over 3 years because of this medication.  Will refill for now patient states no chance of pregnancy.

## 2021-07-13 NOTE — Assessment & Plan Note (Signed)
PDMP reviewed no suspicious activity patient to continue with phentermine 37.5 mg once p.o. daily have advised patient that this will be the last month of this medication.  Patient verbalized understanding.  Also advised patient to continue with ongoing exercise as well as diet management recommended daily calorie count as well with my fitness pal.  Have referred patient to weight management as well referral placed have advised patient if she does not hear anything in the next 1 week to please let me know

## 2021-07-13 NOTE — Assessment & Plan Note (Signed)
Positive hepatitis C panels have referred patient to hepatologist patient is to call and make appointment as she has information from our referral team.  In the meantime have ordered an ultrasound of the abdomen as last diagnostic imaging of the liver was in 2015 with a CT scan.  Pending results have advised patient to limit and/or not take ibuprofen and/or Tylenol, and/or over supplements.  Patient is in alcohol remission so she is not currently ingesting any alcohol.

## 2021-07-13 NOTE — Assessment & Plan Note (Addendum)
Lab work up has been negative, have considered some potential medications that may be interfering and/or increasing hyperhidrosis and have discussed with patient on options as far as discontinuing however patient is hesitant to at this time.  Will refer to endocrinologist for further evaluation and treatment.  Suspect that this may be possibly hormonal

## 2021-07-17 NOTE — Telephone Encounter (Signed)
LMTCB to schedule toc °

## 2021-07-18 DIAGNOSIS — F1120 Opioid dependence, uncomplicated: Secondary | ICD-10-CM | POA: Diagnosis not present

## 2021-07-23 ENCOUNTER — Other Ambulatory Visit: Payer: Self-pay

## 2021-07-23 ENCOUNTER — Ambulatory Visit
Admission: RE | Admit: 2021-07-23 | Discharge: 2021-07-23 | Disposition: A | Discharge status: Home / Self Care | Payer: BC Managed Care – PPO | Source: Ambulatory Visit | Attending: Family | Admitting: Family

## 2021-07-23 DIAGNOSIS — Z9049 Acquired absence of other specified parts of digestive tract: Secondary | ICD-10-CM | POA: Diagnosis not present

## 2021-07-23 DIAGNOSIS — B1920 Unspecified viral hepatitis C without hepatic coma: Secondary | ICD-10-CM | POA: Diagnosis not present

## 2021-07-23 DIAGNOSIS — R7989 Other specified abnormal findings of blood chemistry: Secondary | ICD-10-CM | POA: Diagnosis not present

## 2021-07-23 DIAGNOSIS — K7689 Other specified diseases of liver: Secondary | ICD-10-CM | POA: Diagnosis not present

## 2021-07-30 NOTE — Telephone Encounter (Signed)
Done this morning 

## 2021-07-30 NOTE — Telephone Encounter (Signed)
[name] Phillips, can you assist this faxing this referral? The ROI is in Epic. You should be able to view it to print and fax.   Please fax to  [facility] ATTN: Annamarie Major, NP Fax: [phone]

## 2021-08-01 DIAGNOSIS — F1120 Opioid dependence, uncomplicated: Secondary | ICD-10-CM | POA: Diagnosis not present

## 2021-08-17 DIAGNOSIS — E785 Hyperlipidemia, unspecified: Secondary | ICD-10-CM | POA: Insufficient documentation

## 2021-08-17 DIAGNOSIS — R7989 Other specified abnormal findings of blood chemistry: Secondary | ICD-10-CM | POA: Diagnosis not present

## 2021-08-17 DIAGNOSIS — Z1159 Encounter for screening for other viral diseases: Secondary | ICD-10-CM | POA: Diagnosis not present

## 2021-08-17 DIAGNOSIS — R748 Abnormal levels of other serum enzymes: Secondary | ICD-10-CM | POA: Diagnosis not present

## 2021-08-17 DIAGNOSIS — R768 Other specified abnormal immunological findings in serum: Secondary | ICD-10-CM | POA: Diagnosis not present

## 2021-08-17 DIAGNOSIS — K760 Fatty (change of) liver, not elsewhere classified: Secondary | ICD-10-CM | POA: Insufficient documentation

## 2021-08-17 DIAGNOSIS — B182 Chronic viral hepatitis C: Secondary | ICD-10-CM | POA: Diagnosis not present

## 2021-09-03 DIAGNOSIS — F1120 Opioid dependence, uncomplicated: Secondary | ICD-10-CM | POA: Diagnosis not present

## 2021-09-24 ENCOUNTER — Encounter: Payer: Self-pay | Admitting: Family

## 2021-09-24 DIAGNOSIS — Z6832 Body mass index (BMI) 32.0-32.9, adult: Secondary | ICD-10-CM

## 2021-09-24 DIAGNOSIS — R739 Hyperglycemia, unspecified: Secondary | ICD-10-CM

## 2021-09-24 DIAGNOSIS — E669 Obesity, unspecified: Secondary | ICD-10-CM

## 2021-10-12 ENCOUNTER — Encounter: Payer: Self-pay | Admitting: Family

## 2021-10-12 DIAGNOSIS — E669 Obesity, unspecified: Secondary | ICD-10-CM | POA: Insufficient documentation

## 2021-10-12 DIAGNOSIS — Z6832 Body mass index (BMI) 32.0-32.9, adult: Secondary | ICD-10-CM | POA: Insufficient documentation

## 2021-10-12 DIAGNOSIS — R739 Hyperglycemia, unspecified: Secondary | ICD-10-CM | POA: Insufficient documentation

## 2021-10-12 MED ORDER — WEGOVY 0.25 MG/0.5ML ~~LOC~~ SOAJ: 0.2500 mg | SUBCUTANEOUS | 0 refills | Status: DC

## 2021-10-17 ENCOUNTER — Telehealth: Payer: Self-pay

## 2021-10-17 NOTE — Telephone Encounter (Signed)
Waiting for fax with denial information. ?

## 2021-10-17 NOTE — Telephone Encounter (Signed)
PA started on [facility] 10/17/2021 ?

## 2021-10-23 ENCOUNTER — Encounter: Payer: Self-pay | Admitting: Family

## 2021-10-23 ENCOUNTER — Telehealth: Payer: Self-pay

## 2021-10-23 DIAGNOSIS — F411 Generalized anxiety disorder: Secondary | ICD-10-CM

## 2021-10-23 NOTE — Telephone Encounter (Signed)
Called and informed that PA was denied. Also stated we would start an appeal. ?

## 2021-10-24 MED ORDER — SERTRALINE HCL 100 MG PO TABS: 100.0000 mg | ORAL_TABLET | Freq: Every day | ORAL | 1 refills | Status: DC

## 2021-10-24 NOTE — Telephone Encounter (Signed)
Pt stated she needed Zoloft filled. ?

## 2021-10-24 NOTE — Telephone Encounter (Signed)
This has been filled 

## 2021-12-13 ENCOUNTER — Other Ambulatory Visit: Payer: Self-pay | Admitting: Family Medicine

## 2021-12-13 DIAGNOSIS — I10 Essential (primary) hypertension: Secondary | ICD-10-CM

## 2021-12-14 NOTE — Telephone Encounter (Signed)
Please have pt schedule follow up due this month

## 2021-12-17 NOTE — Telephone Encounter (Signed)
Unable to LVM, VM is full

## 2022-01-16 ENCOUNTER — Other Ambulatory Visit: Payer: Self-pay | Admitting: Family

## 2022-01-16 DIAGNOSIS — I10 Essential (primary) hypertension: Secondary | ICD-10-CM

## 2022-01-24 ENCOUNTER — Other Ambulatory Visit: Payer: Self-pay | Admitting: Family

## 2022-01-24 DIAGNOSIS — F411 Generalized anxiety disorder: Secondary | ICD-10-CM

## 2022-02-13 DIAGNOSIS — D101 Benign neoplasm of tongue: Secondary | ICD-10-CM | POA: Diagnosis not present

## 2022-02-13 DIAGNOSIS — K140 Glossitis: Secondary | ICD-10-CM | POA: Diagnosis not present

## 2022-02-13 DIAGNOSIS — D3702 Neoplasm of uncertain behavior of tongue: Secondary | ICD-10-CM | POA: Diagnosis not present

## 2022-02-15 LAB — SURGICAL PATHOLOGY

## 2022-02-19 ENCOUNTER — Other Ambulatory Visit: Payer: Self-pay | Admitting: Family

## 2022-02-19 DIAGNOSIS — I10 Essential (primary) hypertension: Secondary | ICD-10-CM

## 2022-02-20 NOTE — Telephone Encounter (Signed)
Pt overdue for appt was due for f/u in June Please have her schedule appt for refill

## 2022-02-20 NOTE — Telephone Encounter (Signed)
Pt made appointment for 8/14 @ 8 am

## 2022-02-25 ENCOUNTER — Ambulatory Visit: Payer: BC Managed Care – PPO | Admitting: Family

## 2022-02-25 VITALS — BP 134/86 | HR 85 | Temp 98.6°F | Resp 16 | Ht 65.0 in | Wt 198.0 lb

## 2022-02-25 DIAGNOSIS — Z6832 Body mass index (BMI) 32.0-32.9, adult: Secondary | ICD-10-CM | POA: Diagnosis not present

## 2022-02-25 DIAGNOSIS — L719 Rosacea, unspecified: Secondary | ICD-10-CM | POA: Insufficient documentation

## 2022-02-25 DIAGNOSIS — B351 Tinea unguium: Secondary | ICD-10-CM | POA: Diagnosis not present

## 2022-02-25 DIAGNOSIS — E669 Obesity, unspecified: Secondary | ICD-10-CM

## 2022-02-25 DIAGNOSIS — R7989 Other specified abnormal findings of blood chemistry: Secondary | ICD-10-CM

## 2022-02-25 DIAGNOSIS — I10 Essential (primary) hypertension: Secondary | ICD-10-CM

## 2022-02-25 DIAGNOSIS — E7800 Pure hypercholesterolemia, unspecified: Secondary | ICD-10-CM

## 2022-02-25 DIAGNOSIS — R768 Other specified abnormal immunological findings in serum: Secondary | ICD-10-CM

## 2022-02-25 DIAGNOSIS — F1111 Opioid abuse, in remission: Secondary | ICD-10-CM

## 2022-02-25 LAB — LIPID PANEL
Cholesterol: 217 mg/dL — ABNORMAL HIGH (ref 0–200)
HDL: 47.3 mg/dL (ref >39.00)
LDL Cholesterol: 135 mg/dL — ABNORMAL HIGH (ref 0–99)
NonHDL: 169.75
Total CHOL/HDL Ratio: 5
Triglycerides: 176 mg/dL — ABNORMAL HIGH (ref 0.0–149.0)
VLDL: 35.2 mg/dL (ref 0.0–40.0)

## 2022-02-25 LAB — COMPREHENSIVE METABOLIC PANEL WITH GFR
ALT: 26 U/L (ref 0–35)
AST: 29 U/L (ref 0–37)
Albumin: 4.5 g/dL (ref 3.5–5.2)
Alkaline Phosphatase: 123 U/L — ABNORMAL HIGH (ref 39–117)
BUN: 14 mg/dL (ref 6–23)
CO2: 24 meq/L (ref 19–32)
Calcium: 9.4 mg/dL (ref 8.4–10.5)
Chloride: 103 meq/L (ref 96–112)
Creatinine, Ser: 0.65 mg/dL (ref 0.40–1.20)
GFR: 108.11 mL/min
Glucose, Bld: 93 mg/dL (ref 70–99)
Potassium: 4.2 meq/L (ref 3.5–5.1)
Sodium: 139 meq/L (ref 135–145)
Total Bilirubin: 0.5 mg/dL (ref 0.2–1.2)
Total Protein: 7 g/dL (ref 6.0–8.3)

## 2022-02-25 LAB — TSH: TSH: 1.61 u[IU]/mL (ref 0.35–5.50)

## 2022-02-25 LAB — CBC
HCT: 39 % (ref 36.0–46.0)
Hemoglobin: 13 g/dL (ref 12.0–15.0)
MCHC: 33.4 g/dL (ref 30.0–36.0)
MCV: 93 fl (ref 78.0–100.0)
Platelets: 323 10*3/uL (ref 150.0–400.0)
RBC: 4.19 Mil/uL (ref 3.87–5.11)
RDW: 13.1 % (ref 11.5–15.5)
WBC: 6.5 10*3/uL (ref 4.0–10.5)

## 2022-02-25 MED ORDER — METRONIDAZOLE 0.75 % EX CREA: TOPICAL_CREAM | Freq: Two times a day (BID) | CUTANEOUS | 0 refills | Status: DC

## 2022-02-25 MED ORDER — CICLOPIROX 8 % EX SOLN: Freq: Every day | CUTANEOUS | 0 refills | Status: DC

## 2022-02-25 NOTE — Assessment & Plan Note (Signed)
Repeat lipid panel today pending results. Work on [employer] exercise as tolerated

## 2022-02-25 NOTE — Assessment & Plan Note (Signed)
Repeat cmp today pending results. 

## 2022-02-25 NOTE — Assessment & Plan Note (Signed)
Continue with medications as prescribed

## 2022-02-25 NOTE — Assessment & Plan Note (Signed)
Referred to liver specialist, pending fibroscan however pt is to still make appt for this for f/u. Advised pt the importance of needed f/u.

## 2022-02-25 NOTE — Progress Notes (Signed)
Established Patient Office Visit  Subjective:  Patient ID: [name] Phillips, female    DOB: 01-03-1981  Age: 41 y.o. MRN: [id number]  CC:  Chief Complaint  Patient presents with   Medication Refill    HPI [name] Phillips is here today for follow up.   Changed to buprenorphine (subutex) 8 mg daily. She was recently changed from buprenorphine HCL Naloxone HCL. Her psychiatrist left hte practice suddenly, and she is concerned with how she will get her medication refilled.   GAD: sertraline 100 mg once daily. Doing well on this dosage.   Spotted twice in the last few months, stopped norethindrone 0.35 mg. Some acne out of the normal but otherwise doing well.   Obesity: started on ozempic, doing well with this. with direct primary care, weight loss, in [place]. F/u is every week.   Wt Readings from Last 3 Encounters:  02/25/22 198 lb (89.8 kg)  07/12/21 192 lb 6 oz (87.3 kg)  06/12/21 195 lb (88.5 kg)   HTN: ran out of BP medication, and has not taken it today. Otherwise blood pressure has been ok she just forgot to pick up her refill.  Past Medical History:  Diagnosis Date   Alcohol abuse 11/02/2018   Hypertension    Seizure [facility])    sees dr Sherryll Burger at [facility], no longer has them. 9 years ago.    Hepatis C antibody: saw liver specialist was set up for fibro-scan however cancelled the last two appts. She has to call to make the appt. U/s abdomen with coarsened hepatic echo-texture.   With right toenail fungus on the great toe. Has a pit in it with thickening around the top of the nail. Non tender.    Past Surgical History:  Procedure Laterality Date   CARPAL TUNNEL RELEASE Right 04/11/2021   Procedure: CARPAL TUNNEL RELEASE ENDOSCOPIC;  Surgeon: Christena Flake, MD;  Location: [facility] ORS;  Service: Orthopedics;  Laterality: Right;  Pateint requesting 1st case of the day   CERVICAL SPINE SURGERY  2019   CHOLECYSTECTOMY      Family History  Problem Relation Age of Onset   Alcohol  abuse Other    Hyperlipidemia Mother    Hypertension Mother    COPD Father    Heart disease Father    Hypertension Father    Breast cancer Maternal Aunt 67    Social History   Socioeconomic History   Marital status: Married    Spouse name: Harrold Donath   Number of children: 3   Years of education: college   Highest education level: Not on file  Occupational History   Occupation: Geophysicist/field seismologist: [employer]  Tobacco Use   Smoking status: Former    Packs/day: 1.00    Years: 25.00    Total pack years: 25.00    Types: Cigarettes, E-cigarettes    Quit date: 11/20/2019    Years since quitting: 2.2   Smokeless tobacco: Never   Tobacco comments:    suplementing with e-cig to help fully quit  Vaping Use   Vaping Use: Some days   Start date: 11/20/2019   Devices: [place]  Substance and Sexual Activity   Alcohol use: Not Currently   Drug use: Not Currently   Sexual activity: Yes    Birth control/protection: Pill  Other Topics Concern   Not on file  Social History Narrative   10/18/20   From: the area ([place] originally[place]   Living: with Harrold Donath, 2004  Work: Investment banker, corporate      Family: 3 adult children - [place], [place], and [place]      Enjoys: relax, camping      Exercise: not currently   Diet: pretty good, limits sweets, limiting bread, diet soda      Safety   Seat belts: Yes    Guns: Yes  and secure   Safe in relationships: Yes    Social Determinants of Corporate investment banker Strain: Not on file  Food Insecurity: Not on file  Transportation Needs: Not on file  Physical Activity: Not on file  Stress: Not on file  Social Connections: Not on file  Intimate Partner Violence: Not on file    Outpatient Medications Prior to Visit  Medication Sig Dispense Refill   bisoprolol-hydrochlorothiazide (ZIAC) 5-6.25 MG tablet TAKE ONE TABLET BY MOUTH EVERY DAY 30 tablet 0   buprenorphine (SUBUTEX) 8 MG SUBL SL tablet Place 8 mg under the tongue daily.      cetirizine (ZYRTEC) 10 MG tablet Take 10 mg by mouth daily.     Melatonin 12 MG TABS Take 3 mg by mouth at bedtime.     Multiple Vitamin (MULTI-VITAMIN) tablet Take 1 tablet by mouth daily.     omeprazole (PRILOSEC) 20 MG capsule Take 20 mg by mouth daily.     Semaglutide,0.25 or 0.5MG /DOS, (OZEMPIC, 0.25 OR 0.5 MG/DOSE,) 2 MG/1.5ML SOPN Inject 0.25 mg into the skin once a week.     sertraline (ZOLOFT) 100 MG tablet Take 1 tablet (100 mg total) by mouth daily. 90 tablet 1   Buprenorphine HCl-Naloxone HCl 12-3 MG FILM Place 1 Film under the tongue daily. (Patient not taking: Reported on 02/25/2022)     norethindrone (MICRONOR) 0.35 MG tablet Take 1 tablet (0.35 mg total) by mouth daily. (Patient not taking: Reported on 02/25/2022) 90 tablet 1   phentermine 37.5 MG capsule Take 1 capsule (37.5 mg total) by mouth every morning. (Patient not taking: Reported on 02/25/2022) 30 capsule 0   Semaglutide-Weight Management (WEGOVY) 0.25 MG/0.5ML SOAJ Inject 0.25 mg into the skin once a week. (Patient not taking: Reported on 02/25/2022) 2 mL 0   No facility-administered medications prior to visit.    Allergies  Allergen Reactions   Wellbutrin [Bupropion]     Suicidal thoughts    Chantix [Varenicline Tartrate] Anxiety       Objective:    Physical Exam Constitutional:      General: She is not in acute distress.    Appearance: Normal appearance. She is obese. She is not ill-appearing, toxic-appearing or diaphoretic.  Cardiovascular:     Rate and Rhythm: Normal rate and regular rhythm.  Pulmonary:     Effort: Pulmonary effort is normal.  Feet:     Right foot:     Toenail Condition: Right toenails are abnormally thick. Fungal disease present. Neurological:     General: No focal deficit present.     Mental Status: She is alert and oriented to person, place, and time. Mental status is at baseline.  Psychiatric:        Mood and Affect: Mood normal.        Behavior: Behavior normal.         Thought Content: Thought content normal.        Judgment: Judgment normal.     BP 134/86   Pulse 85   Temp 98.6 F (37 C)   Resp 16   Ht 5\' 5"  (1.651 m)   Wt 198  lb (89.8 kg)   SpO2 97%   BMI 32.95 kg/m  Wt Readings from Last 3 Encounters:  02/25/22 198 lb (89.8 kg)  07/12/21 192 lb 6 oz (87.3 kg)  06/12/21 195 lb (88.5 kg)     Health Maintenance Due  Topic Date Due   COVID-19 Vaccine (1) Never done   INFLUENZA VACCINE  02/12/2022    There are no preventive care reminders to display for this patient.  Lab Results  Component Value Date   TSH 2.67 06/12/2021   Lab Results  Component Value Date   WBC 6.6 02/15/2021   HGB 13.0 02/15/2021   HCT 38.3 02/15/2021   MCV 94.7 02/15/2021   PLT 331.0 02/15/2021   Lab Results  Component Value Date   NA 135 02/15/2021   K 4.8 04/06/2021   CO2 27 02/15/2021   GLUCOSE 92 02/15/2021   BUN 13 02/15/2021   CREATININE 0.72 02/15/2021   BILITOT 0.7 02/15/2021   ALKPHOS 96 02/15/2021   AST 44 (H) 02/15/2021   ALT 42 (H) 02/15/2021   PROT 7.3 02/15/2021   ALBUMIN 4.6 02/15/2021   CALCIUM 10.2 02/15/2021   ANIONGAP 14 11/01/2018   GFR 103.41 02/15/2021   Lab Results  Component Value Date   CHOL 234 (H) 02/15/2021   Lab Results  Component Value Date   HDL 60.50 02/15/2021   Lab Results  Component Value Date   LDLCALC 101 (H) 02/06/2017   Lab Results  Component Value Date   TRIG 269.0 (H) 02/15/2021   Lab Results  Component Value Date   CHOLHDL 4 02/15/2021   Lab Results  Component Value Date   HGBA1C 4.8 07/21/2012      Assessment & Plan:   Problem List Items Addressed This Visit       Cardiovascular and Mediastinum   HTN (hypertension)    Continue with medications as prescribed        Musculoskeletal and Integument   Rosacea   Relevant Medications   metroNIDAZOLE (METROCREAM) 0.75 % cream   Other Relevant Orders   Comprehensive metabolic panel   CBC   Toenail fungus - Primary   Relevant  Medications   ciclopirox (PENLAC) 8 % solution     Other   HCV antibody positive    Referred to liver specialist, pending fibroscan however pt is to still make appt for this for f/u. Advised pt the importance of needed f/u.       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 Have placed referral as well for psychiatry.       Elevated liver function tests    Repeat cmp today pending results.       Obesity (BMI 30-39.9)   Relevant Medications   Semaglutide,0.25 or 0.5MG /DOS, (OZEMPIC, 0.25 OR 0.5 MG/DOSE,) 2 MG/1.5ML SOPN   Other Relevant Orders   TSH   BMI 32.0-32.9,adult   Relevant Orders   TSH   Elevated LDL cholesterol level    Repeat lipid panel today pending results. Work on [employer] exercise as tolerated      Relevant Orders   Comprehensive metabolic panel   Lipid panel    Meds ordered this encounter  Medications   ciclopirox (PENLAC) 8 % solution    Sig: Apply topically at bedtime. Apply over nail and surrounding skin. Apply daily over previous coat.  After seven (7) days, may remove with alcohol and continue cycle.    Dispense:  6.6 mL    Refill:  0    Order Specific Question:   Supervising Provider    Answer:   BEDSOLE, AMY E [[id number]]   metroNIDAZOLE (METROCREAM) 0.75 % cream    Sig: Apply topically 2 (two) times daily.    Dispense:  45 g    Refill:  0    Order Specific Question:   Supervising Provider    Answer:   BEDSOLE, AMY E [[id number]]    Follow-up: No follow-ups on file.    Mort Sawyers, FNP

## 2022-02-25 NOTE — Assessment & Plan Note (Signed)
Advised pt she needs to make f/u with pain management and or psychiatry for refill of her subutex as I can not prescribe this medication. Have placed referral as well for psychiatry.

## 2022-02-25 NOTE — Patient Instructions (Addendum)
Call to liver center to reschedule the fibroscan.   Pick up your blood pressure medication!   Call around to get in with psychiatrist.   Stop by the lab prior to leaving today. I will notify you of your results once received.    -----------------------------------------------  Please refer to this list, and call around to see which place may be best suited for you.  Let me know which one you would like to go with, and if needed I will place a referral for you.  Please remember to ask them if they take your insurance. Another option is to call your insurance and inquire about available covered psychiatrists, and make appointment with one covered.   [facility] located at [address], [place], [place].  [phone].      [facility] located at [address], [place], [place]. [phone].     [facility] located at [address], [place], [place].  [phone].     [facility] located at [address] [place], [place], [place].  [phone].      [facility] located at [address], [place], [place].  [phone].        [facility] located at [address], [place], [place].  [phone].        [facility] - 4 local practices located at:    [address], [place], [place].  [phone].    [address] [place], [place], [place].  [phone].    [address], [place], [place].  [phone].    [address], [place], [place]. [phone].      [facility] located at [address], [place], [place]. [phone].        [facility] located at [address] [place], [place] [address]- [phone]?      [facility] located at [address] [place], [place] [address] [phone]?      [facility] located [address], [place], [place] [address] = [phone]?      [facility] located at [address], [place], [place] [address] = [phone]?      [facility] [address] [place], [place] [address] - [phone]

## 2022-02-26 NOTE — Progress Notes (Signed)
Alk phos trending down a bit but still slightly elevated.   Cholesterol too high, I do suggest starting a statin for cholesterol management would she be willing?  Otherwise labs unremarkable

## 2022-03-01 ENCOUNTER — Other Ambulatory Visit: Payer: Self-pay | Admitting: Family

## 2022-03-01 DIAGNOSIS — R739 Hyperglycemia, unspecified: Secondary | ICD-10-CM

## 2022-03-01 DIAGNOSIS — E7800 Pure hypercholesterolemia, unspecified: Secondary | ICD-10-CM

## 2022-03-01 MED ORDER — ATORVASTATIN CALCIUM 10 MG PO TABS
10.0000 mg | ORAL_TABLET | Freq: Every day | ORAL | 3 refills | Status: DC
Start: 2022-03-01 — End: 2022-10-03

## 2022-03-26 ENCOUNTER — Other Ambulatory Visit: Payer: Self-pay | Admitting: Family

## 2022-03-26 DIAGNOSIS — I10 Essential (primary) hypertension: Secondary | ICD-10-CM

## 2022-04-26 ENCOUNTER — Other Ambulatory Visit: Payer: Self-pay | Admitting: Family

## 2022-04-26 DIAGNOSIS — L719 Rosacea, unspecified: Secondary | ICD-10-CM

## 2022-04-26 DIAGNOSIS — I10 Essential (primary) hypertension: Secondary | ICD-10-CM

## 2022-04-26 DIAGNOSIS — F411 Generalized anxiety disorder: Secondary | ICD-10-CM

## 2022-06-01 ENCOUNTER — Other Ambulatory Visit: Payer: Self-pay | Admitting: Family

## 2022-06-01 DIAGNOSIS — I10 Essential (primary) hypertension: Secondary | ICD-10-CM

## 2022-07-10 ENCOUNTER — Other Ambulatory Visit: Payer: Self-pay | Admitting: Family

## 2022-07-10 DIAGNOSIS — I10 Essential (primary) hypertension: Secondary | ICD-10-CM

## 2022-08-13 ENCOUNTER — Other Ambulatory Visit: Payer: Self-pay | Admitting: Nurse Practitioner

## 2022-08-13 DIAGNOSIS — I10 Essential (primary) hypertension: Secondary | ICD-10-CM

## 2022-08-15 NOTE — Telephone Encounter (Signed)
Pt needs appt next month for f/u  Will need this appt for future refills

## 2022-08-15 NOTE — Telephone Encounter (Signed)
Left message to return call to our office. Will need to schedule a f/u appt once she returns the call for further refills.

## 2022-08-16 NOTE — Telephone Encounter (Signed)
Sent a mychart message advising patient medication sent for 30 days and no refills. To call our office to schedule an appt before next refill is due.

## 2022-10-03 ENCOUNTER — Encounter: Payer: Self-pay | Admitting: Family

## 2022-10-03 ENCOUNTER — Ambulatory Visit (INDEPENDENT_AMBULATORY_CARE_PROVIDER_SITE_OTHER): Payer: Commercial Managed Care - PPO | Admitting: Family

## 2022-10-03 VITALS — BP 160/102 | HR 92 | Temp 97.8°F | Ht 65.0 in | Wt 161.8 lb

## 2022-10-03 DIAGNOSIS — R7989 Other specified abnormal findings of blood chemistry: Secondary | ICD-10-CM

## 2022-10-03 DIAGNOSIS — F411 Generalized anxiety disorder: Secondary | ICD-10-CM

## 2022-10-03 DIAGNOSIS — E7800 Pure hypercholesterolemia, unspecified: Secondary | ICD-10-CM

## 2022-10-03 DIAGNOSIS — E782 Mixed hyperlipidemia: Secondary | ICD-10-CM

## 2022-10-03 DIAGNOSIS — Z79899 Other long term (current) drug therapy: Secondary | ICD-10-CM

## 2022-10-03 DIAGNOSIS — I10 Essential (primary) hypertension: Secondary | ICD-10-CM | POA: Diagnosis not present

## 2022-10-03 LAB — COMPREHENSIVE METABOLIC PANEL
ALT: 28 U/L (ref 0–35)
AST: 22 U/L (ref 0–37)
Albumin: 4.4 g/dL (ref 3.5–5.2)
Alkaline Phosphatase: 136 U/L — ABNORMAL HIGH (ref 39–117)
BUN: 11 mg/dL (ref 6–23)
CO2: 28 mEq/L (ref 19–32)
Calcium: 9.6 mg/dL (ref 8.4–10.5)
Chloride: 101 mEq/L (ref 96–112)
Creatinine, Ser: 0.58 mg/dL (ref 0.40–1.20)
GFR: 110.65 mL/min (ref >60.00)
Glucose, Bld: 103 mg/dL — ABNORMAL HIGH (ref 70–99)
Potassium: 3.9 mEq/L (ref 3.5–5.1)
Sodium: 138 mEq/L (ref 135–145)
Total Bilirubin: 0.6 mg/dL (ref 0.2–1.2)
Total Protein: 7.4 g/dL (ref 6.0–8.3)

## 2022-10-03 LAB — LIPID PANEL
Cholesterol: 184 mg/dL (ref 0–200)
HDL: 50.9 mg/dL (ref >39.00)
LDL Cholesterol: 111 mg/dL — ABNORMAL HIGH (ref 0–99)
NonHDL: 133.56
Total CHOL/HDL Ratio: 4
Triglycerides: 115 mg/dL (ref 0.0–149.0)
VLDL: 23 mg/dL (ref 0.0–40.0)

## 2022-10-03 MED ORDER — BISOPROLOL-HYDROCHLOROTHIAZIDE 5-6.25 MG PO TABS: 1.0000 | ORAL_TABLET | Freq: Every day | ORAL | 3 refills | Status: DC

## 2022-10-03 MED ORDER — SERTRALINE HCL 100 MG PO TABS: 100.0000 mg | ORAL_TABLET | Freq: Every day | ORAL | 3 refills | Status: DC

## 2022-10-03 MED ORDER — ATORVASTATIN CALCIUM 10 MG PO TABS: 10.0000 mg | ORAL_TABLET | Freq: Every day | ORAL | 3 refills | Status: DC

## 2022-10-03 NOTE — Progress Notes (Signed)
Established Patient Office Visit  Subjective:      CC:  Chief Complaint  Patient presents with   Medical Management of Chronic Issues    HPI: [name] Phillips is a 42 y.o. female presenting on 10/03/2022 for Medical Management of Chronic Issues .  Alcohol abuse h/o: stopped subutex in the fall, doing well and still alcohol free.   Anxiety/depression: doing well with sertraline 100 mg once daily.     10/03/2022    2:50 PM 10/18/2020   10:13 AM 01/28/2020   10:28 AM 09/22/2018   11:43 AM  GAD 7 : Generalized Anxiety Score  Nervous, Anxious, on Edge 0 1 1 1   Control/stop worrying 0 1 1 1   Worry too much - different things 0 1 2 1   Trouble relaxing 0 1 2 1   Restless 1 1 2 1   Easily annoyed or irritable 0 1 1 1   Afraid - awful might happen 0 1 1 1   Total GAD 7 Score 1 7 10 7   Anxiety Difficulty Not difficult at all Somewhat difficult Somewhat difficult        10/03/2022    2:49 PM 10/18/2020   10:13 AM 01/28/2020    9:35 AM  PHQ9 SCORE ONLY  PHQ-9 Total Score 2 11 0   08/17/21, last visit with hepatology, [facility]. Seeing them for positive hep c antibody with detectable HCV RNA and steatosis on u/s intermittent lft elevation dating back to 2013.   HLD, HTN: has been without blood pressure and cholesterol medical for about two weeks. Is due to pick this up from the pharmacy.   Lipid Panel     Component Value Date/Time   CHOL 184 10/03/2022 1301   TRIG 115.0 10/03/2022 1301   HDL 50.90 10/03/2022 1301   CHOLHDL 4 10/03/2022 1301   VLDL 23.0 10/03/2022 1301   LDLCALC 111 (H) 10/03/2022 1301   LDLDIRECT 132.0 02/15/2021 99991111   Last metabolic panel Lab Results  Component Value Date   GLUCOSE 103 (H) 10/03/2022   NA 138 10/03/2022   K 3.9 10/03/2022   CL 101 10/03/2022   CO2 28 10/03/2022   BUN 11 10/03/2022   CREATININE 0.58 10/03/2022   GFRNONAA >60 11/01/2018   CALCIUM 9.6 10/03/2022   PHOS 2.1 (L) 07/21/2012   PROT 7.4 10/03/2022   ALBUMIN 4.4 10/03/2022    BILITOT 0.6 10/03/2022   ALKPHOS 136 (H) 10/03/2022   AST 22 10/03/2022   ALT 28 10/03/2022   ANIONGAP 14 11/01/2018           Social history:  Relevant past medical, surgical, family and social history reviewed and updated as indicated. Interim medical history since our last visit reviewed.  Allergies and medications reviewed and updated.  DATA REVIEWED: CHART IN EPIC     ROS: Negative unless specifically indicated above in HPI.    Current Outpatient Medications:    cetirizine (ZYRTEC) 10 MG tablet, Take 10 mg by mouth daily., Disp: , Rfl:    ciclopirox (PENLAC) 8 % solution, Apply topically at bedtime. Apply over nail and surrounding skin. Apply daily over previous coat. After seven (7) days, may remove with alcohol and continue cycle., Disp: 6.6 mL, Rfl: 0   Melatonin 12 MG TABS, Take 3 mg by mouth at bedtime., Disp: , Rfl:    metroNIDAZOLE (METROCREAM) 0.75 % cream, Apply topically 2 (two) times daily., Disp: 45 g, Rfl: 0   Multiple Vitamin (MULTI-VITAMIN) tablet, Take 1 tablet by mouth daily., Disp: ,  Rfl:    omeprazole (PRILOSEC) 20 MG capsule, Take 20 mg by mouth daily., Disp: , Rfl:    Semaglutide,0.25 or 0.5MG /DOS, (OZEMPIC, 0.25 OR 0.5 MG/DOSE,) 2 MG/1.5ML SOPN, Inject 0.25 mg into the skin. Bi-weekly, Disp: , Rfl:    atorvastatin (LIPITOR) 10 MG tablet, Take 1 tablet (10 mg total) by mouth daily., Disp: 90 tablet, Rfl: 3   bisoprolol-hydrochlorothiazide (ZIAC) 5-6.25 MG tablet, Take 1 tablet by mouth daily., Disp: 90 tablet, Rfl: 3   sertraline (ZOLOFT) 100 MG tablet, Take 1 tablet (100 mg total) by mouth daily., Disp: 90 tablet, Rfl: 3      Objective:    BP (!) 160/102   Pulse 92   Temp 97.8 F (36.6 C) (Temporal)   Ht 5\' 5"  (1.651 m)   Wt 161 lb 12.8 oz (73.4 kg)   SpO2 99%   BMI 26.92 kg/m   Wt Readings from Last 3 Encounters:  10/03/22 161 lb 12.8 oz (73.4 kg)  02/25/22 198 lb (89.8 kg)  07/12/21 192 lb 6 oz (87.3 kg)    Physical  Exam Constitutional:      General: She is not in acute distress.    Appearance: Normal appearance. She is normal weight. She is not ill-appearing, toxic-appearing or diaphoretic.  HENT:     Head: Normocephalic.  Cardiovascular:     Rate and Rhythm: Normal rate and regular rhythm.  Pulmonary:     Effort: Pulmonary effort is normal.  Musculoskeletal:        General: Normal range of motion.  Neurological:     General: No focal deficit present.     Mental Status: She is alert and oriented to person, place, and time. Mental status is at baseline.  Psychiatric:        Mood and Affect: Mood normal.        Behavior: Behavior normal.        Thought Content: Thought content normal.        Judgment: Judgment normal.             Assessment & Plan:  On statin therapy -     Comprehensive metabolic panel -     Lipid panel  Elevated LDL cholesterol level -     Atorvastatin Calcium; Take 1 tablet (10 mg total) by mouth daily.  Dispense: 90 tablet; Refill: 3 -     Lipid panel  Essential hypertension -     Bisoprolol-hydroCHLOROthiazide; Take 1 tablet by mouth daily.  Dispense: 90 tablet; Refill: 3  Generalized anxiety disorder -     Sertraline HCl; Take 1 tablet (100 mg total) by mouth daily.  Dispense: 90 tablet; Refill: 3  Elevated liver function tests Assessment & Plan: Again reiterated need for consult/f/u with hepatology  She states she will call to make appt  Orders: -     Comprehensive metabolic panel  Mixed hyperlipidemia Assessment & Plan: Continue atorvastatin  Ordered lipid panel, pending results. Work on low cholesterol diet and exercise as tolerated    Primary hypertension Assessment & Plan: Resume medication for blood pressure  Discussed compliance  Pt advised of the following:  Continue medication as prescribed. Monitor blood pressure periodically and/or when you feel symptomatic. Goal is <130/90 on average. Ensure that you have rested for 30 minutes prior  to checking your blood pressure. Record your readings and bring them to your next visit if necessary.work on a low sodium diet.       Return in about 6 months (around  04/05/2023) for f/u CPE.  Eugenia Pancoast, MSN, APRN, FNP-C [place]

## 2022-10-07 NOTE — Assessment & Plan Note (Signed)
Again reiterated need for consult/f/u with hepatology  She states she will call to make appt

## 2022-10-07 NOTE — Assessment & Plan Note (Signed)
Continue atorvastatin  Ordered lipid panel, pending results. Work on low cholesterol diet and exercise as tolerated  

## 2022-10-07 NOTE — Assessment & Plan Note (Signed)
Resume medication for blood pressure  Discussed compliance  Pt advised of the following:  Continue medication as prescribed. Monitor blood pressure periodically and/or when you feel symptomatic. Goal is <130/90 on average. Ensure that you have rested for 30 minutes prior to checking your blood pressure. Record your readings and bring them to your next visit if necessary.work on a low sodium diet.

## 2022-10-23 ENCOUNTER — Encounter: Payer: Self-pay | Admitting: Family

## 2023-07-02 ENCOUNTER — Other Ambulatory Visit: Payer: Self-pay | Admitting: Family

## 2023-07-02 DIAGNOSIS — E7800 Pure hypercholesterolemia, unspecified: Secondary | ICD-10-CM

## 2023-09-19 ENCOUNTER — Telehealth: Admitting: Physician Assistant

## 2023-09-19 DIAGNOSIS — J019 Acute sinusitis, unspecified: Secondary | ICD-10-CM

## 2023-09-19 DIAGNOSIS — B9689 Other specified bacterial agents as the cause of diseases classified elsewhere: Secondary | ICD-10-CM

## 2023-09-19 MED ORDER — DOXYCYCLINE HYCLATE 100 MG PO TABS: 100.0000 mg | ORAL_TABLET | Freq: Two times a day (BID) | ORAL | 0 refills | Status: DC

## 2023-09-19 MED ORDER — PREDNISONE 20 MG PO TABS: 40.0000 mg | ORAL_TABLET | Freq: Every day | ORAL | 0 refills | Status: DC

## 2023-09-19 MED ORDER — FLUCONAZOLE 150 MG PO TABS: 150.0000 mg | ORAL_TABLET | ORAL | 0 refills | Status: DC | PRN

## 2023-09-19 NOTE — Patient Instructions (Addendum)
 Helen Hashimoto, thank you for joining [name], PA-C for today's virtual visit.  While this provider is not your primary care provider (PCP), if your PCP is located in our provider database this encounter information will be shared with them immediately following your visit.   A [facility] MyChart account gives you access to today's visit and all your visits, tests, and labs performed at [facility] " click here if you don't have a [facility] MyChart account or go to [email or web address]  Consent: (Patient) Helen Hashimoto provided verbal consent for this virtual visit at the beginning of the encounter.  Current Medications:  Current Outpatient Medications:    doxycycline (VIBRA-TABS) 100 MG tablet, Take 1 tablet (100 mg total) by mouth 2 (two) times daily., Disp: 20 tablet, Rfl: 0   fluconazole (DIFLUCAN) 150 MG tablet, Take 1 tablet (150 mg total) by mouth every 3 (three) days as needed., Disp: 3 tablet, Rfl: 0   predniSONE (DELTASONE) 20 MG tablet, Take 2 tablets (40 mg total) by mouth daily with breakfast., Disp: 14 tablet, Rfl: 0   atorvastatin (LIPITOR) 10 MG tablet, Take 1 tablet (10 mg total) by mouth daily., Disp: 90 tablet, Rfl: 3   bisoprolol-hydrochlorothiazide (ZIAC) 5-6.25 MG tablet, Take 1 tablet by mouth daily., Disp: 90 tablet, Rfl: 3   cetirizine (ZYRTEC) 10 MG tablet, Take 10 mg by mouth daily., Disp: , Rfl:    ciclopirox (PENLAC) 8 % solution, Apply topically at bedtime. Apply over nail and surrounding skin. Apply daily over previous coat. After seven (7) days, may remove with alcohol and continue cycle., Disp: 6.6 mL, Rfl: 0   Melatonin 12 MG TABS, Take 3 mg by mouth at bedtime., Disp: , Rfl:    metroNIDAZOLE (METROCREAM) 0.75 % cream, Apply topically 2 (two) times daily., Disp: 45 g, Rfl: 0   Multiple Vitamin (MULTI-VITAMIN) tablet, Take 1 tablet by mouth daily., Disp: , Rfl:    omeprazole (PRILOSEC) 20 MG capsule, Take 20 mg by mouth  daily., Disp: , Rfl:    Semaglutide,0.25 or 0.5MG /DOS, (OZEMPIC, 0.25 OR 0.5 MG/DOSE,) 2 MG/1.5ML SOPN, Inject 0.25 mg into the skin. Bi-weekly, Disp: , Rfl:    sertraline (ZOLOFT) 100 MG tablet, Take 1 tablet (100 mg total) by mouth daily., Disp: 90 tablet, Rfl: 3   Medications ordered in this encounter:  Meds ordered this encounter  Medications   doxycycline (VIBRA-TABS) 100 MG tablet    Sig: Take 1 tablet (100 mg total) by mouth 2 (two) times daily.    Dispense:  20 tablet    Refill:  0    Supervising Provider:   Merrilee Jansky [[id number]]   predniSONE (DELTASONE) 20 MG tablet    Sig: Take 2 tablets (40 mg total) by mouth daily with breakfast.    Dispense:  14 tablet    Refill:  0    Supervising Provider:   Merrilee Jansky [[id number]]   fluconazole (DIFLUCAN) 150 MG tablet    Sig: Take 1 tablet (150 mg total) by mouth every 3 (three) days as needed.    Dispense:  3 tablet    Refill:  0    Supervising Provider:   Merrilee Jansky [[id number]]     *If you need refills on other medications prior to your next appointment, please contact your pharmacy*  Follow-Up: Call back or seek an in-person evaluation if the symptoms worsen or if the condition fails to improve as anticipated.  [facility] [phone]  Other Instructions Sinus Infection, Adult A sinus infection, also called sinusitis, is inflammation of your sinuses. Sinuses are hollow spaces in the bones around your face. Your sinuses are located: Around your eyes. In the middle of your forehead. Behind your nose. In your cheekbones. Mucus normally drains out of your sinuses. When your nasal tissues become inflamed or swollen, mucus can become trapped or blocked. This allows bacteria, viruses, and fungi to grow, which leads to infection. Most infections of the sinuses are caused by a virus. A sinus infection can develop quickly. It can last for up to 4 weeks (acute) or for more than 12 weeks (chronic). A sinus  infection often develops after a cold. What are the causes? This condition is caused by anything that creates swelling in the sinuses or stops mucus from draining. This includes: Allergies. Asthma. Infection from bacteria or viruses. Deformities or blockages in your nose or sinuses. Abnormal growths in the nose (nasal polyps). Pollutants, such as chemicals or irritants in the air. Infection from fungi. This is rare. What increases the risk? You are more likely to develop this condition if you: Have a weak body defense system (immune system). Do a lot of swimming or diving. Overuse nasal sprays. Smoke. What are the signs or symptoms? The main symptoms of this condition are pain and a feeling of pressure around the affected sinuses. Other symptoms include: Stuffy nose or congestion that makes it difficult to breathe through your nose. Thick yellow or greenish drainage from your nose. Tenderness, swelling, and warmth over the affected sinuses. A cough that may get worse at night. Decreased sense of smell and taste. Extra mucus that collects in the throat or the back of the nose (postnasal drip) causing a sore throat or bad breath. Tiredness (fatigue). Fever. How is this diagnosed? This condition is diagnosed based on: Your symptoms. Your medical history. A physical exam. Tests to find out if your condition is acute or chronic. This may include: Checking your nose for nasal polyps. Viewing your sinuses using a device that has a light (endoscope). Testing for allergies or bacteria. Imaging tests, such as an MRI or CT scan. In rare cases, a bone biopsy may be done to rule out more serious types of fungal sinus disease. How is this treated? Treatment for a sinus infection depends on the cause and whether your condition is chronic or acute. If caused by a virus, your symptoms should go away on their own within 10 days. You may be given medicines to relieve symptoms. They  include: Medicines that shrink swollen nasal passages (decongestants). A spray that eases inflammation of the nostrils (topical intranasal corticosteroids). Rinses that help get rid of thick mucus in your nose (nasal saline washes). Medicines that treat allergies (antihistamines). Over-the-counter pain relievers. If caused by bacteria, your health care provider may recommend waiting to see if your symptoms improve. Most bacterial infections will get better without antibiotic medicine. You may be given antibiotics if you have: A severe infection. A weak immune system. If caused by narrow nasal passages or nasal polyps, surgery may be needed. Follow these instructions at home: Medicines Take, use, or apply over-the-counter and prescription medicines only as told by your health care provider. These may include nasal sprays. If you were prescribed an antibiotic medicine, take it as told by your health care provider. Do not stop taking the antibiotic even if you start to feel better. Hydrate and humidify  Drink enough fluid to keep your urine pale yellow. Staying  hydrated will help to thin your mucus. Use a cool mist humidifier to keep the humidity level in your home above 50%. Inhale steam for 10-15 minutes, 3-4 times a day, or as told by your health care provider. You can do this in the bathroom while a hot shower is running. Limit your exposure to cool or dry air. Rest Rest as much as possible. Sleep with your head raised (elevated). Make sure you get enough sleep each night. General instructions  Apply a warm, moist washcloth to your face 3-4 times a day or as told by your health care provider. This will help with discomfort. Use nasal saline washes as often as told by your health care provider. Wash your hands often with soap and water to reduce your exposure to germs. If soap and water are not available, use hand sanitizer. Do not smoke. Avoid being around people who are smoking  (secondhand smoke). Keep all follow-up visits. This is important. Contact a health care provider if: You have a fever. Your symptoms get worse. Your symptoms do not improve within 10 days. Get help right away if: You have a severe headache. You have persistent vomiting. You have severe pain or swelling around your face or eyes. You have vision problems. You develop confusion. Your neck is stiff. You have trouble breathing. These symptoms may be an emergency. Get help right away. Call 911. Do not wait to see if the symptoms will go away. Do not drive yourself to the hospital. Summary A sinus infection is soreness and inflammation of your sinuses. Sinuses are hollow spaces in the bones around your face. This condition is caused by nasal tissues that become inflamed or swollen. The swelling traps or blocks the flow of mucus. This allows bacteria, viruses, and fungi to grow, which leads to infection. If you were prescribed an antibiotic medicine, take it as told by your health care provider. Do not stop taking the antibiotic even if you start to feel better. Keep all follow-up visits. This is important. This information is not intended to replace advice given to you by your health care provider. Make sure you discuss any questions you have with your health care provider. Document Revised: 06/05/2021 Document Reviewed: 06/05/2021 Elsevier Patient Education  2024 Elsevier [employer]   If you have been instructed to have an in-person evaluation today at a local Urgent Care facility, please use the link below. It will take you to a list of all of our available [facility], including address, phone number and hours of operation. Please do not delay care.  [facility]  If you or a family member do not have a primary care provider, use the link below to schedule a visit and establish care. When you choose a [facility] primary care physician or advanced practice provider, you  gain a long-term partner in health. Find a Primary Care Provider  Learn more about [facility] in-office and virtual care options: [facility] - Get Care Now

## 2023-09-19 NOTE — Progress Notes (Signed)
 Virtual Visit Consent   [name] Phillips, you are scheduled for a virtual visit with a [facility] provider today. Just as with appointments in the office, your consent must be obtained to participate. Your consent will be active for this visit and any virtual visit you may have with one of our providers in the next 365 days. If you have a MyChart account, a copy of this consent can be sent to you electronically.  As this is a virtual visit, video technology does not allow for your provider to perform a traditional examination. This may limit your provider's ability to fully assess your condition. If your provider identifies any concerns that need to be evaluated in person or the need to arrange testing (such as labs, EKG, etc.), we will make arrangements to do so. Although advances in technology are sophisticated, we cannot ensure that it will always work on either your end or our end. If the connection with a video visit is poor, the visit may have to be switched to a telephone visit. With either a video or telephone visit, we are not always able to ensure that we have a secure connection.  By engaging in this virtual visit, you consent to the provision of healthcare and authorize for your insurance to be billed (if applicable) for the services provided during this visit. Depending on your insurance coverage, you may receive a charge related to this service.  I need to obtain your verbal consent now. Are you willing to proceed with your visit today? [name] Phillips has provided verbal consent on 09/19/2023 for a virtual visit (video or telephone). [name] Loveless, PA-C  Date: 09/19/2023 3:21 PM   Virtual Visit via Video Note   I, [name] Phillips, connected with  [name] Phillips  ([id number], February 14, 1981) on 09/19/23 at  3:15 PM EST by a video-enabled telemedicine application and verified that I am speaking with the correct person using two identifiers.  Location: Patient: Virtual Visit  Location Patient: Home Provider: Virtual Visit Location Provider: Home Office   I discussed the limitations of evaluation and management by telemedicine and the availability of in person appointments. The patient expressed understanding and agreed to proceed.    History of Present Illness: [name] Phillips is a 43 y.o. who identifies as a female who was assigned female at birth, and is being seen today for possible sinus infection.  HPI: Sinusitis This is a new problem. The current episode started 1 to 4 weeks ago (going on about a month; took a leftover Rx of amoxicillin but was not a full dose, symptoms improved slightly but not completely then returned worse). The problem has been gradually worsening since onset. There has been no fever. Associated symptoms include congestion, ear pain (mild; bilateral), headaches and sinus pressure. Pertinent negatives include no chills, coughing or sore throat. Treatments tried: amoxicillin, sinus relief, nasal sprays; tried lots of OTC options. The treatment provided no relief.     Problems:  Patient Active Problem List   Diagnosis Date Noted   Elevated LDL cholesterol level 02/25/2022   Rosacea 02/25/2022   Toenail fungus 02/25/2022   Obesity (BMI 30-39.9) 10/12/2021   BMI 32.0-32.9,adult 10/12/2021   Hyperglycemia 10/12/2021   Hepatic steatosis 08/17/2021   Hyperlipidemia 08/17/2021   Hyperhidrosis 07/13/2021   Polyarthralgia 06/13/2021   Elevated liver function tests 06/13/2021   Opioid abuse, in remission (HCC) 06/12/2021   Class 1 obesity with serious comorbidity and body mass index (BMI) of 33.0 to  33.9 in adult 05/10/2021   Carpal tunnel syndrome, right 03/23/2021   Insomnia 10/18/2020   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   MDD (major depressive disorder), recurrent severe, 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   History of ovarian cyst 12/16/2017   Premenstrual syndrome 07/28/2017   HTN (hypertension) 05/03/2011   Generalized anxiety  disorder 04/05/2011   Former smoker 02/02/2007    Allergies:  Allergies  Allergen Reactions   Wellbutrin [Bupropion]     Suicidal thoughts    Chantix [Varenicline Tartrate] Anxiety   Medications:  Current Outpatient Medications:    doxycycline (VIBRA-TABS) 100 MG tablet, Take 1 tablet (100 mg total) by mouth 2 (two) times daily., Disp: 20 tablet, Rfl: 0   fluconazole (DIFLUCAN) 150 MG tablet, Take 1 tablet (150 mg total) by mouth every 3 (three) days as needed., Disp: 3 tablet, Rfl: 0   predniSONE (DELTASONE) 20 MG tablet, Take 2 tablets (40 mg total) by mouth daily with breakfast., Disp: 14 tablet, Rfl: 0   atorvastatin (LIPITOR) 10 MG tablet, Take 1 tablet (10 mg total) by mouth daily., Disp: 90 tablet, Rfl: 3   bisoprolol-hydrochlorothiazide (ZIAC) 5-6.25 MG tablet, Take 1 tablet by mouth daily., Disp: 90 tablet, Rfl: 3   cetirizine (ZYRTEC) 10 MG tablet, Take 10 mg by mouth daily., Disp: , Rfl:    ciclopirox (PENLAC) 8 % solution, Apply topically at bedtime. Apply over nail and surrounding skin. Apply daily over previous coat. After seven (7) days, may remove with alcohol and continue cycle., Disp: 6.6 mL, Rfl: 0   Melatonin 12 MG TABS, Take 3 mg by mouth at bedtime., Disp: , Rfl:    metroNIDAZOLE (METROCREAM) 0.75 % cream, Apply topically 2 (two) times daily., Disp: 45 g, Rfl: 0   Multiple Vitamin (MULTI-VITAMIN) tablet, Take 1 tablet by mouth daily., Disp: , Rfl:    omeprazole (PRILOSEC) 20 MG capsule, Take 20 mg by mouth daily., Disp: , Rfl:    Semaglutide,0.25 or 0.5MG /DOS, (OZEMPIC, 0.25 OR 0.5 MG/DOSE,) 2 MG/1.5ML SOPN, Inject 0.25 mg into the skin. Bi-weekly, Disp: , Rfl:    sertraline (ZOLOFT) 100 MG tablet, Take 1 tablet (100 mg total) by mouth daily., Disp: 90 tablet, Rfl: 3  Observations/Objective: Patient is well-developed, well-nourished in no acute distress.  Resting comfortably at home.  Head is normocephalic, atraumatic.  No labored breathing.  Speech is clear and  coherent with logical content.  Patient is alert and oriented at baseline.    Assessment and Plan: 1. Acute bacterial sinusitis (Primary) - doxycycline (VIBRA-TABS) 100 MG tablet; Take 1 tablet (100 mg total) by mouth 2 (two) times daily.  Dispense: 20 tablet; Refill: 0 - predniSONE (DELTASONE) 20 MG tablet; Take 2 tablets (40 mg total) by mouth daily with breakfast.  Dispense: 14 tablet; Refill: 0 - fluconazole (DIFLUCAN) 150 MG tablet; Take 1 tablet (150 mg total) by mouth every 3 (three) days as needed.  Dispense: 3 tablet; Refill: 0  - Worsening symptoms that have not responded to OTC medications.  - Will give Doxycycline - Prednisone for inflammation - Continue allergy medications.  - Steam and humidifier can help - Stay well hydrated and get plenty of rest.  - Diflucan given as prophylaxis as patient tends to get vaginal yeast infections with antibiotic use - Seek in person evaluation if no symptom improvement or if symptoms worsen   Follow Up Instructions: I discussed the assessment and treatment plan with the patient. The patient was provided an opportunity to ask questions and all were answered.  The patient agreed with the plan and demonstrated an understanding of the instructions.  A copy of instructions were sent to the patient via MyChart unless otherwise noted below.    The patient was advised to call back or seek an in-person evaluation if the symptoms worsen or if the condition fails to improve as anticipated.    [name] Loveless, PA-C

## 2023-10-02 ENCOUNTER — Other Ambulatory Visit: Payer: Self-pay | Admitting: Family

## 2023-10-02 DIAGNOSIS — E7800 Pure hypercholesterolemia, unspecified: Secondary | ICD-10-CM

## 2023-10-02 DIAGNOSIS — I10 Essential (primary) hypertension: Secondary | ICD-10-CM

## 2023-10-15 IMAGING — US US ABDOMEN COMPLETE
1 series · 14 of 25 positions shown · non-contrast
Comparison: None.

CLINICAL DATA: Abnormal results for hepatitis-C

EXAM:
ABDOMEN ULTRASOUND COMPLETE

[Series 1: us abdomen complete · 14 of 89 slices shown]
[im 1/89]
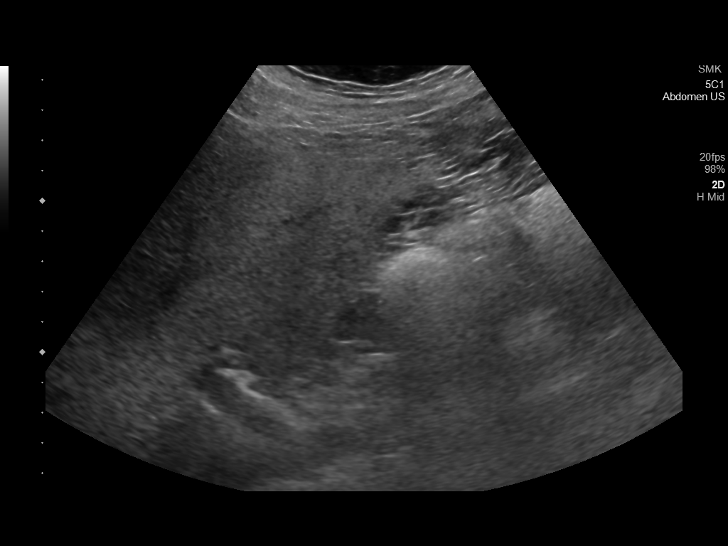
[im 8/89]
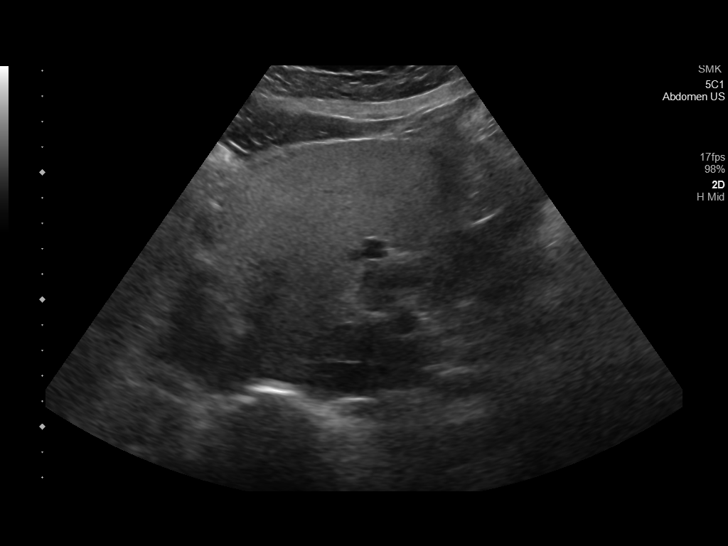
[im 15/89]
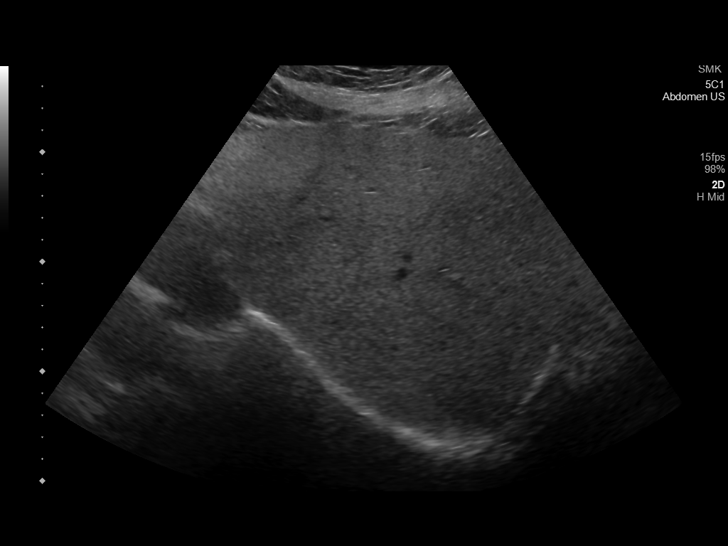
[im 23/89]
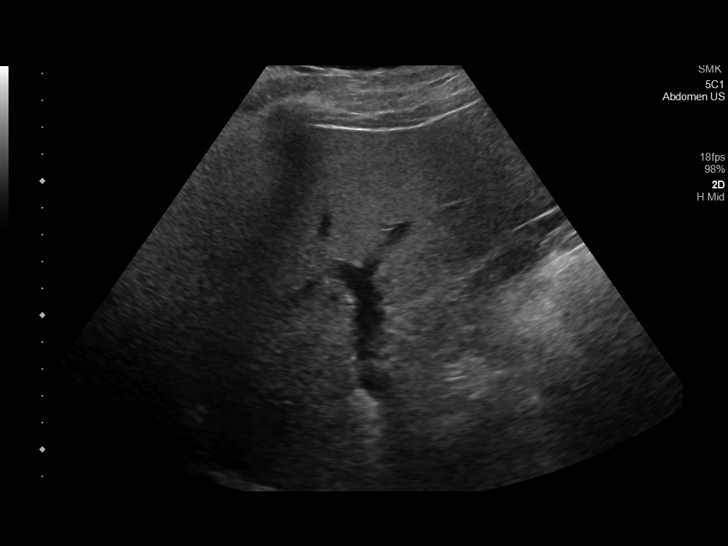
[im 30/89]
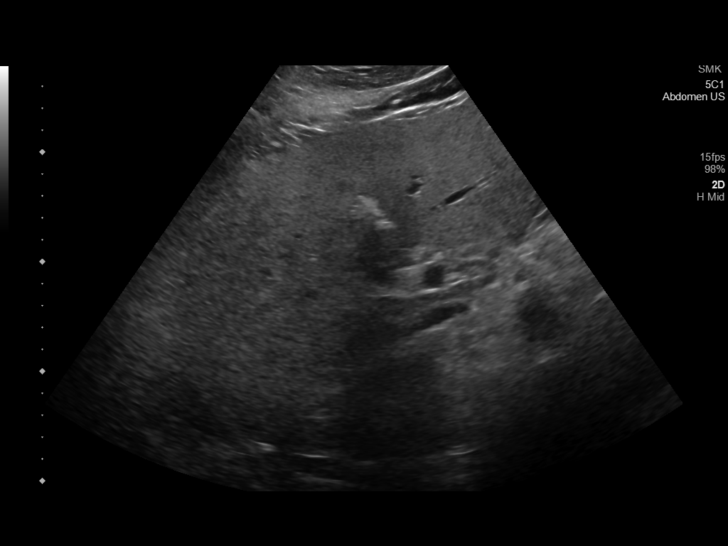
[im 34/89]
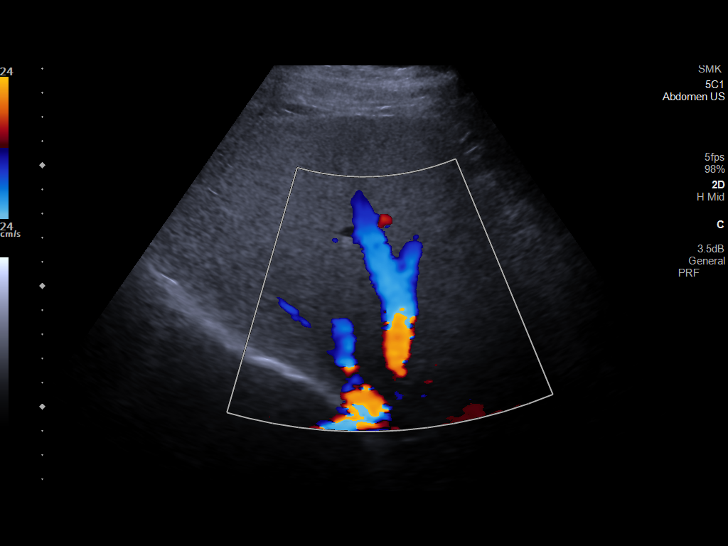
[im 41/89]
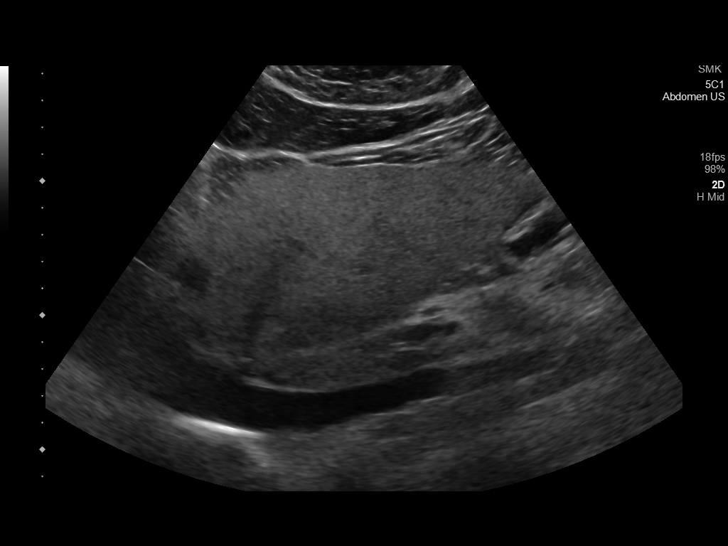
[im 48/89]
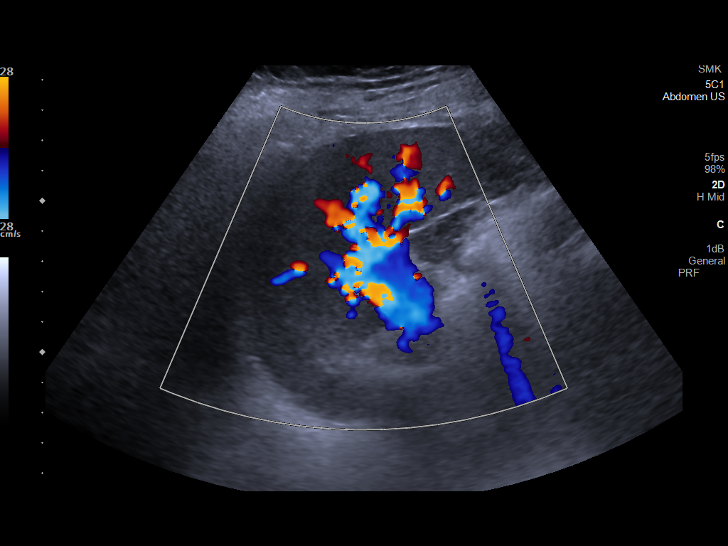
[im 56/89]
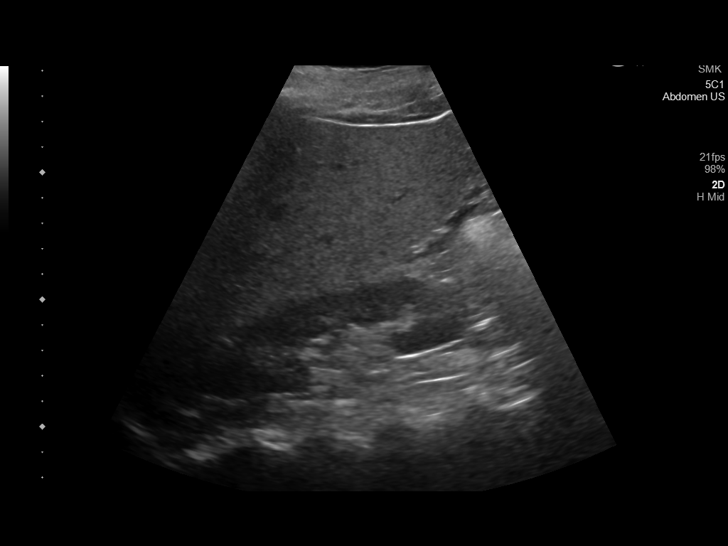
[im 59/89]
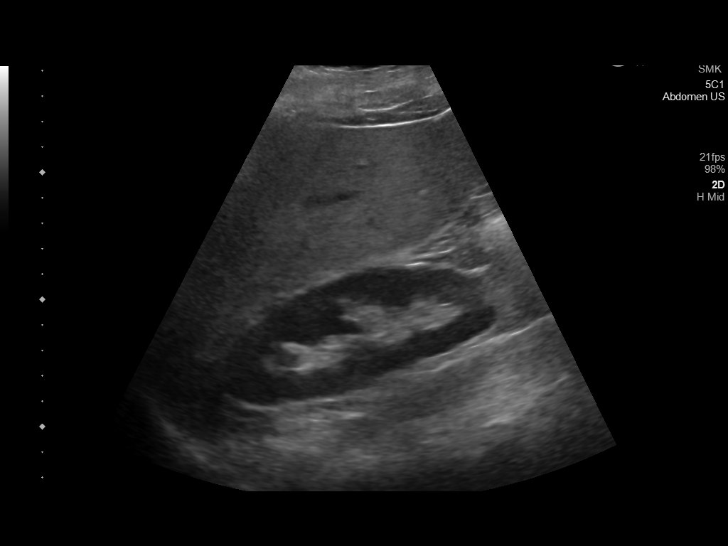
[im 67/89]
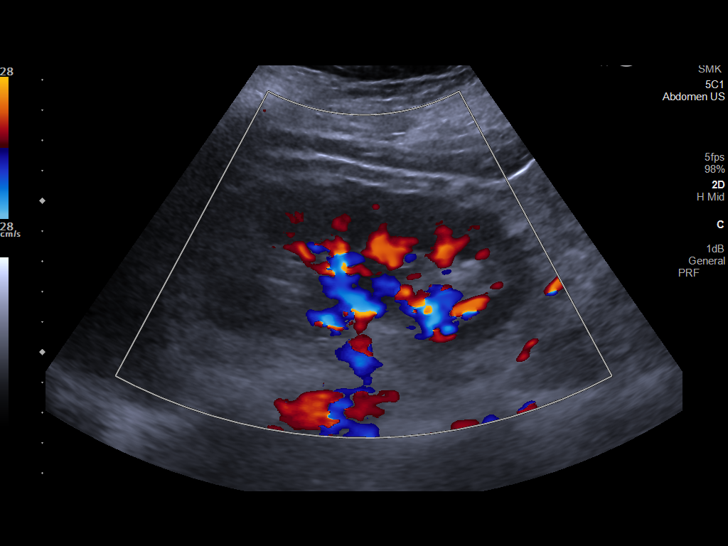
[im 74/89]
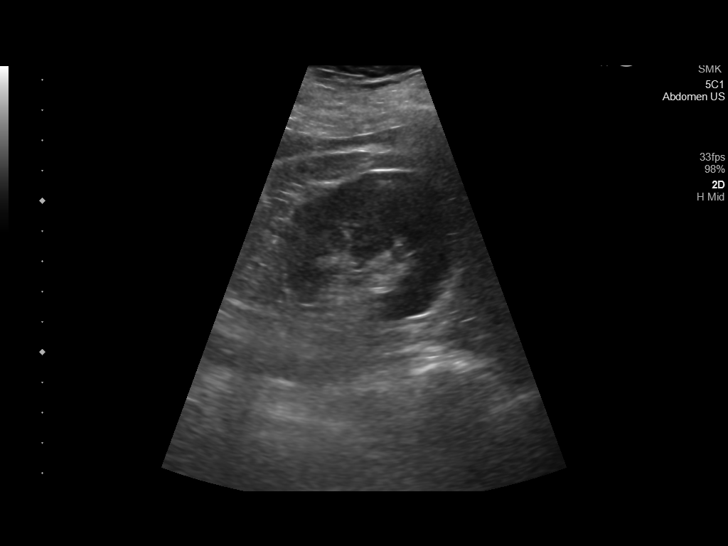
[im 81/89]
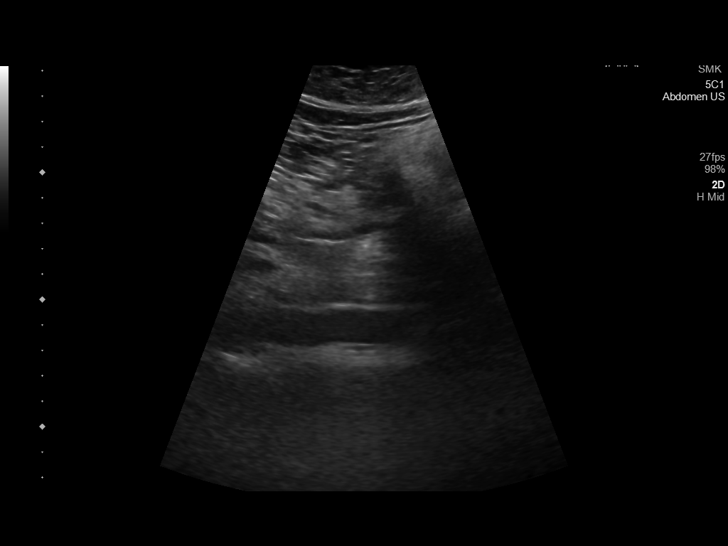
[im 89/89]
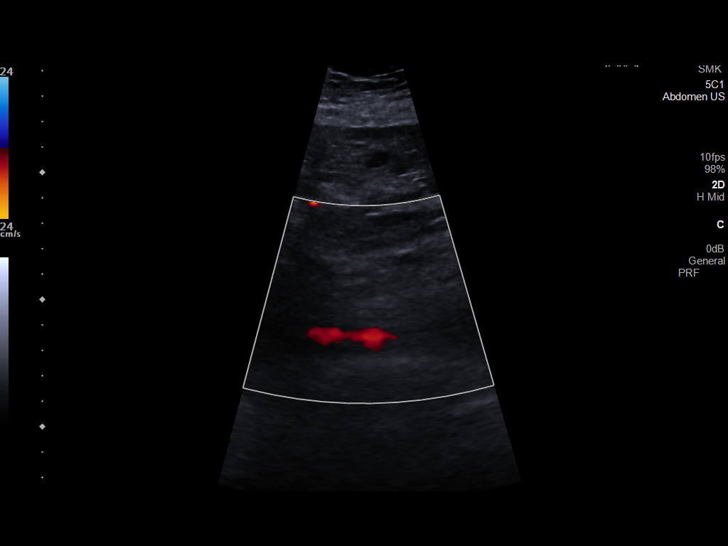

[14 of 25 positions shown; findings below may reference images not displayed]

FINDINGS: Gallbladder: Surgically absent

Common bile duct: Diameter: 5 mm

Liver: No focal lesion identified. Increased parenchymal
echogenicity with coarsened hepatic echotexture. Portal vein is
patent on color Doppler imaging with normal direction of blood flow
towards the liver.

IVC: No abnormality visualized.

Pancreas: Visualized portion unremarkable.

Spleen: Size and appearance within normal limits.

Right Kidney: Length: 11.1 cm. Echogenicity within normal limits. No
mass or hydronephrosis visualized.

Left Kidney: Length: 11.5 cm. Echogenicity within normal limits. No
mass or hydronephrosis visualized.

Abdominal aorta: No aneurysm visualized.

Other findings: None.
IMPRESSION: The echogenicity of the liver is increased. This is a nonspecific
finding but is most commonly seen with hepatic steatosis or
hepatocellular disease. There are no obvious focal liver lesions.

## 2023-10-21 ENCOUNTER — Other Ambulatory Visit: Payer: Self-pay | Admitting: Family

## 2023-10-21 DIAGNOSIS — F411 Generalized anxiety disorder: Secondary | ICD-10-CM

## 2023-10-29 ENCOUNTER — Encounter: Payer: Self-pay | Admitting: Family

## 2023-10-29 ENCOUNTER — Ambulatory Visit (INDEPENDENT_AMBULATORY_CARE_PROVIDER_SITE_OTHER): Admitting: Family

## 2023-10-29 VITALS — BP 130/78 | HR 80 | Temp 97.4°F | Ht 65.0 in | Wt 174.0 lb

## 2023-10-29 DIAGNOSIS — R7989 Other specified abnormal findings of blood chemistry: Secondary | ICD-10-CM

## 2023-10-29 DIAGNOSIS — E7800 Pure hypercholesterolemia, unspecified: Secondary | ICD-10-CM | POA: Diagnosis not present

## 2023-10-29 DIAGNOSIS — R41840 Attention and concentration deficit: Secondary | ICD-10-CM | POA: Insufficient documentation

## 2023-10-29 DIAGNOSIS — E663 Overweight: Secondary | ICD-10-CM | POA: Insufficient documentation

## 2023-10-29 DIAGNOSIS — E782 Mixed hyperlipidemia: Secondary | ICD-10-CM

## 2023-10-29 DIAGNOSIS — K2100 Gastro-esophageal reflux disease with esophagitis, without bleeding: Secondary | ICD-10-CM | POA: Insufficient documentation

## 2023-10-29 DIAGNOSIS — F411 Generalized anxiety disorder: Secondary | ICD-10-CM | POA: Diagnosis not present

## 2023-10-29 DIAGNOSIS — I10 Essential (primary) hypertension: Secondary | ICD-10-CM

## 2023-10-29 DIAGNOSIS — K760 Fatty (change of) liver, not elsewhere classified: Secondary | ICD-10-CM

## 2023-10-29 LAB — LIPID PANEL
Cholesterol: 154 mg/dL (ref 0–200)
HDL: 60.3 mg/dL (ref >39.00)
LDL Cholesterol: 60 mg/dL (ref 0–99)
NonHDL: 93.47
Total CHOL/HDL Ratio: 3
Triglycerides: 169 mg/dL — ABNORMAL HIGH (ref 0.0–149.0)
VLDL: 33.8 mg/dL (ref 0.0–40.0)

## 2023-10-29 LAB — COMPREHENSIVE METABOLIC PANEL WITH GFR
ALT: 23 U/L (ref 0–35)
AST: 20 U/L (ref 0–37)
Albumin: 4.8 g/dL (ref 3.5–5.2)
Alkaline Phosphatase: 82 U/L (ref 39–117)
BUN: 16 mg/dL (ref 6–23)
CO2: 32 meq/L (ref 19–32)
Calcium: 9.9 mg/dL (ref 8.4–10.5)
Chloride: 99 meq/L (ref 96–112)
Creatinine, Ser: 0.62 mg/dL (ref 0.40–1.20)
GFR: 108.07 mL/min (ref >60.00)
Glucose, Bld: 80 mg/dL (ref 70–99)
Potassium: 4 meq/L (ref 3.5–5.1)
Sodium: 140 meq/L (ref 135–145)
Total Bilirubin: 0.4 mg/dL (ref 0.2–1.2)
Total Protein: 7 g/dL (ref 6.0–8.3)

## 2023-10-29 MED ORDER — OMEPRAZOLE 20 MG PO CPDR: 20.0000 mg | DELAYED_RELEASE_CAPSULE | Freq: Every day | ORAL | 1 refills | Status: AC

## 2023-10-29 MED ORDER — SERTRALINE HCL 100 MG PO TABS: 100.0000 mg | ORAL_TABLET | Freq: Every day | ORAL | 3 refills | Status: DC

## 2023-10-29 MED ORDER — ATORVASTATIN CALCIUM 10 MG PO TABS: 10.0000 mg | ORAL_TABLET | Freq: Every day | ORAL | 3 refills | Status: AC

## 2023-10-29 MED ORDER — SERTRALINE HCL 100 MG PO TABS: ORAL_TABLET | ORAL | 3 refills | Status: DC

## 2023-10-29 NOTE — Assessment & Plan Note (Signed)
 Pt advised to work on diet and exercise as tolerated  Could consider gLP 1 if insurance covered The beneficiary does not have any FDA labeled contraindications to the requested agent including pregnancy, lactation, h/o medullary thyroid cancer or multiple endocrine neoplasia type II.

## 2023-10-29 NOTE — Assessment & Plan Note (Signed)
Repeat cmp today pending results. 

## 2023-10-29 NOTE — Assessment & Plan Note (Signed)
 F/u with hepatology overdue  Fibroscan needed per notes

## 2023-10-29 NOTE — Patient Instructions (Signed)
  Ask your insurance if wegovy or zepbound would be covered.

## 2023-10-29 NOTE — Assessment & Plan Note (Signed)
 Stable Continue medication for blood pressure

## 2023-10-29 NOTE — Progress Notes (Addendum)
 Established Patient Office Visit  Subjective:      CC:  Chief Complaint  Patient presents with   Medication Refill    Discuss increasing zoloft     HPI: [name] Phillips is a 43 y.o. female presenting on 10/29/2023 for Medication Refill (Discuss increasing zoloft )  GAD: sertraline  100 mg once daily. Ran out of sertraline  about 3 days ago. She wants to increase sertraline  if possible, has some stressful situations going on that is increasing her anxiety. Does see a therapist, working with [facility] that is has been helpful.   Elevated LFT: last saw hepatology 2023 ordered fibroscan to stage fibrosis however pt did not complete. U/s abd 07/23/21 echogenicity liver is increased.   HLD: on atorvastatin  10 mg nightly   HTN: on bisoprolol  hydrochlorothiazide  once daily. Stable in office today 130/78.   New complaints: Has some ADHD concerns. Often forgetting things, disorganized, can not handle multiple tasks, lack of focus concentration and often feeling restless.   Obesity: The beneficiary does not have any FDA labeled contraindications to the requested agent including pregnancy, lactation, h/o medullary thyroid  cancer or multiple endocrine neoplasia type II.   Exercise: no established exercise regimen  Diet: general doing well with portion control.    Lab Results  Component Value Date   HGBA1C 4.8 07/21/2012      Social history:  Relevant past medical, surgical, family and social history reviewed and updated as indicated. Interim medical history since our last visit reviewed.  Allergies and medications reviewed and updated.  DATA REVIEWED: CHART IN EPIC     ROS: Negative unless specifically indicated above in HPI.    Current Outpatient Medications:    bisoprolol -hydrochlorothiazide  (ZIAC ) 5-6.25 MG tablet, Take 1 tablet by mouth daily. MUST HAVE OV FOR FURTHER REFILLS, Disp: 90 tablet, Rfl: 0   cetirizine (ZYRTEC) 10 MG tablet, Take 10 mg by mouth  daily., Disp: , Rfl:    ciclopirox  (PENLAC ) 8 % solution, Apply topically at bedtime. Apply over nail and surrounding skin. Apply daily over previous coat. After seven (7) days, may remove with alcohol and continue cycle., Disp: 6.6 mL, Rfl: 0   Melatonin 12 MG TABS, Take 3 mg by mouth at bedtime., Disp: , Rfl:    Multiple Vitamin (MULTI-VITAMIN) tablet, Take 1 tablet by mouth daily., Disp: , Rfl:    Semaglutide ,0.25 or 0.5MG /DOS, (OZEMPIC , 0.25 OR 0.5 MG/DOSE,) 2 MG/1.5ML SOPN, Inject 0.25 mg into the skin. Bi-weekly, Disp: , Rfl:    atorvastatin  (LIPITOR) 10 MG tablet, Take 1 tablet (10 mg total) by mouth daily. MUST HAVE OV FOR FURTHER REFILLS, Disp: 90 tablet, Rfl: 3   omeprazole  (PRILOSEC) 20 MG capsule, Take 1 capsule (20 mg total) by mouth daily., Disp: 30 capsule, Rfl: 1   sertraline  (ZOLOFT ) 100 MG tablet, Take one daily po at bedtime, Disp: 90 tablet, Rfl: 3      Objective:    BP 130/78   Pulse 80   Temp (!) 97.4 F (36.3 C) (Oral)   Ht 5\' 5"  (1.651 m)   Wt 174 lb (78.9 kg)   LMP  (LMP Unknown)   SpO2 99%   BMI 28.96 kg/m   Wt Readings from Last 3 Encounters:  10/29/23 174 lb (78.9 kg)  10/03/22 161 lb 12.8 oz (73.4 kg)  02/25/22 198 lb (89.8 kg)    Physical Exam Constitutional:      General: She is not in acute distress.    Appearance: Normal appearance. She is normal weight.  She is not ill-appearing, toxic-appearing or diaphoretic.  HENT:     Head: Normocephalic.  Cardiovascular:     Rate and Rhythm: Normal rate and regular rhythm.  Pulmonary:     Effort: Pulmonary effort is normal.  Abdominal:     General: Bowel sounds are normal.     Palpations: Abdomen is soft.     Tenderness: There is no abdominal tenderness.  Musculoskeletal:        General: Normal range of motion.  Neurological:     General: No focal deficit present.     Mental Status: She is alert and oriented to person, place, and time. Mental status is at baseline.  Psychiatric:        Mood and  Affect: Mood normal.        Behavior: Behavior normal.        Thought Content: Thought content normal.        Judgment: Judgment normal.           Assessment & Plan:  Elevated liver function tests Assessment & Plan: Repeat cmp today pending results.    Orders: -     Comprehensive metabolic panel with GFR  Elevated LDL cholesterol level Assessment & Plan: Repeat lipid panel today pending results. Work on [employer] exercise as tolerated Continue atorvastatin  10 mg   Orders: -     Atorvastatin  Calcium ; Take 1 tablet (10 mg total) by mouth daily. MUST HAVE OV FOR FURTHER REFILLS  Dispense: 90 tablet; Refill: 3  Generalized anxiety disorder Assessment & Plan: Referral placed for psychiatry to eval for possible ADHD, pt with concerns  Increase sertraline  to 200 mg once daily.   Orders: -     Ambulatory referral to Psychiatry -     Sertraline  HCl; Take one daily po at bedtime  Dispense: 90 tablet; Refill: 3  Mixed hyperlipidemia -     Lipid panel  Hepatic steatosis Assessment & Plan: F/u with hepatology overdue  Fibroscan needed per notes   Primary hypertension Assessment & Plan: Stable Continue medication for blood pressure   Orders: -     Microalbumin / creatinine urine ratio; Future  Gastroesophageal reflux disease with esophagitis without hemorrhage Assessment & Plan: Try to decrease and or avoid spicy foods, fried fatty foods, and also caffeine and chocolate as these can increase heartburn symptoms.  Discussed trial to d/c omeprazole  in the future will use for 30 days and then see if able to wean off.   Orders: -     Omeprazole ; Take 1 capsule (20 mg total) by mouth daily.  Dispense: 30 capsule; Refill: 1  Lack of concentration -     Ambulatory referral to Psychiatry  Overweight (BMI 25.0-29.9) Assessment & Plan: Pt advised to work on diet and exercise as tolerated  Could consider gLP 1 if insurance covered The beneficiary does not have any FDA  labeled contraindications to the requested agent including pregnancy, lactation, h/o medullary thyroid  cancer or multiple endocrine neoplasia type II.        Return in about 6 months (around 04/29/2024) for f/u CPE.  Felicita Horns, MSN, APRN, FNP-C [facility]

## 2023-10-29 NOTE — Assessment & Plan Note (Signed)
 Try to decrease and or avoid spicy foods, fried fatty foods, and also caffeine and chocolate as these can increase heartburn symptoms.  Discussed trial to d/c omeprazole in the future will use for 30 days and then see if able to wean off.

## 2023-10-29 NOTE — Assessment & Plan Note (Signed)
 Repeat lipid panel today pending results. Work on [employer] exercise as tolerated Continue atorvastatin 10 mg

## 2023-10-30 ENCOUNTER — Encounter: Payer: Self-pay | Admitting: Family

## 2023-11-03 ENCOUNTER — Ambulatory Visit: Payer: Self-pay

## 2023-11-03 ENCOUNTER — Encounter: Payer: Self-pay | Admitting: Family

## 2023-11-03 MED ORDER — SERTRALINE HCL 100 MG PO TABS: ORAL_TABLET | ORAL | 1 refills | Status: AC

## 2023-11-03 NOTE — Assessment & Plan Note (Signed)
 Referral placed for psychiatry to eval for possible ADHD, pt with concerns  Increase sertraline  to 200 mg once daily.

## 2023-11-03 NOTE — Telephone Encounter (Signed)
 Pt called wanting to know why prescription of Sertraline  wasn't 200mg  when picking up from pharmacy. Note from visit didn't not mention increase in dosage from 100mg . RN called [facility] and spoke with Amya who said to route CRM and staff would look at it.          Copied from CRM [id number]. Topic: Clinical - Prescription Issue >> Nov 03, 2023 11:58 AM Jethro Morrison wrote: Reason for CRM: PT CALLED STATED SHE WENT TO THE PHARMACY TO PICK UP HER PRESCRIPTION BUT IT WAS NOT INCREASED. STATED IT SHOULD HAVE BEEN INCREASED TO 200MG . THE MEDICATION IS sertraline  (ZOLOFT ) 100 MG tablet

## 2023-11-03 NOTE — Addendum Note (Signed)
 Addended by: Felicita Horns on: 11/03/2023 02:42 PM   Modules accepted: Orders

## 2024-01-07 ENCOUNTER — Other Ambulatory Visit: Payer: Self-pay | Admitting: Family

## 2024-01-07 DIAGNOSIS — I10 Essential (primary) hypertension: Secondary | ICD-10-CM

## 2024-03-18 ENCOUNTER — Other Ambulatory Visit: Payer: Self-pay | Admitting: Family

## 2024-03-18 DIAGNOSIS — I10 Essential (primary) hypertension: Secondary | ICD-10-CM

## 2024-06-27 ENCOUNTER — Inpatient Hospital Stay
Admit: 2024-06-27 | Discharge: 2024-06-27 | Disposition: A | Attending: Student in an Organized Health Care Education/Training Program

## 2024-06-27 ENCOUNTER — Inpatient Hospital Stay
Admission: EM | Admit: 2024-06-27 | Discharge: 2024-07-15 | DRG: 853 | Disposition: A | Discharge status: Home Health | Source: Ambulatory Visit | Attending: Obstetrics and Gynecology | Admitting: Obstetrics and Gynecology

## 2024-06-27 ENCOUNTER — Inpatient Hospital Stay

## 2024-06-27 ENCOUNTER — Other Ambulatory Visit: Payer: Self-pay

## 2024-06-27 ENCOUNTER — Emergency Department

## 2024-06-27 DIAGNOSIS — F1190 Opioid use, unspecified, uncomplicated: Secondary | ICD-10-CM | POA: Diagnosis not present

## 2024-06-27 DIAGNOSIS — F1129 Opioid dependence with unspecified opioid-induced disorder: Secondary | ICD-10-CM

## 2024-06-27 DIAGNOSIS — Z825 Family history of asthma and other chronic lower respiratory diseases: Secondary | ICD-10-CM

## 2024-06-27 DIAGNOSIS — Z79899 Other long term (current) drug therapy: Secondary | ICD-10-CM | POA: Diagnosis not present

## 2024-06-27 DIAGNOSIS — R339 Retention of urine, unspecified: Secondary | ICD-10-CM | POA: Diagnosis not present

## 2024-06-27 DIAGNOSIS — J154 Pneumonia due to other streptococci: Secondary | ICD-10-CM | POA: Diagnosis not present

## 2024-06-27 DIAGNOSIS — F1111 Opioid abuse, in remission: Secondary | ICD-10-CM | POA: Diagnosis not present

## 2024-06-27 DIAGNOSIS — F1990 Other psychoactive substance use, unspecified, uncomplicated: Secondary | ICD-10-CM | POA: Diagnosis not present

## 2024-06-27 DIAGNOSIS — G7281 Critical illness myopathy: Secondary | ICD-10-CM | POA: Diagnosis not present

## 2024-06-27 DIAGNOSIS — J9602 Acute respiratory failure with hypercapnia: Secondary | ICD-10-CM | POA: Diagnosis present

## 2024-06-27 DIAGNOSIS — J189 Pneumonia, unspecified organism: Secondary | ICD-10-CM | POA: Insufficient documentation

## 2024-06-27 DIAGNOSIS — R0602 Shortness of breath: Secondary | ICD-10-CM | POA: Diagnosis present

## 2024-06-27 DIAGNOSIS — E860 Dehydration: Secondary | ICD-10-CM | POA: Diagnosis not present

## 2024-06-27 DIAGNOSIS — E870 Hyperosmolality and hypernatremia: Secondary | ICD-10-CM | POA: Diagnosis not present

## 2024-06-27 DIAGNOSIS — R0902 Hypoxemia: Secondary | ICD-10-CM | POA: Diagnosis not present

## 2024-06-27 DIAGNOSIS — R531 Weakness: Secondary | ICD-10-CM | POA: Diagnosis not present

## 2024-06-27 DIAGNOSIS — R6521 Severe sepsis with septic shock: Secondary | ICD-10-CM | POA: Diagnosis present

## 2024-06-27 DIAGNOSIS — J9601 Acute respiratory failure with hypoxia: Secondary | ICD-10-CM | POA: Diagnosis present

## 2024-06-27 DIAGNOSIS — R739 Hyperglycemia, unspecified: Secondary | ICD-10-CM | POA: Diagnosis not present

## 2024-06-27 DIAGNOSIS — R569 Unspecified convulsions: Secondary | ICD-10-CM | POA: Diagnosis not present

## 2024-06-27 DIAGNOSIS — B182 Chronic viral hepatitis C: Secondary | ICD-10-CM | POA: Diagnosis present

## 2024-06-27 DIAGNOSIS — R410 Disorientation, unspecified: Secondary | ICD-10-CM | POA: Diagnosis not present

## 2024-06-27 DIAGNOSIS — N179 Acute kidney failure, unspecified: Secondary | ICD-10-CM | POA: Insufficient documentation

## 2024-06-27 DIAGNOSIS — R6520 Severe sepsis without septic shock: Secondary | ICD-10-CM | POA: Diagnosis not present

## 2024-06-27 DIAGNOSIS — G40909 Epilepsy, unspecified, not intractable, without status epilepticus: Secondary | ICD-10-CM | POA: Diagnosis present

## 2024-06-27 DIAGNOSIS — E7800 Pure hypercholesterolemia, unspecified: Secondary | ICD-10-CM

## 2024-06-27 DIAGNOSIS — Z9911 Dependence on respirator [ventilator] status: Secondary | ICD-10-CM | POA: Diagnosis not present

## 2024-06-27 DIAGNOSIS — R1310 Dysphagia, unspecified: Secondary | ICD-10-CM | POA: Diagnosis not present

## 2024-06-27 DIAGNOSIS — A403 Sepsis due to Streptococcus pneumoniae: Principal | ICD-10-CM | POA: Diagnosis present

## 2024-06-27 DIAGNOSIS — R1312 Dysphagia, oropharyngeal phase: Secondary | ICD-10-CM | POA: Diagnosis not present

## 2024-06-27 DIAGNOSIS — R12 Heartburn: Secondary | ICD-10-CM | POA: Diagnosis not present

## 2024-06-27 DIAGNOSIS — A419 Sepsis, unspecified organism: Secondary | ICD-10-CM | POA: Diagnosis not present

## 2024-06-27 DIAGNOSIS — I10 Essential (primary) hypertension: Secondary | ICD-10-CM | POA: Diagnosis present

## 2024-06-27 DIAGNOSIS — Z87891 Personal history of nicotine dependence: Secondary | ICD-10-CM | POA: Diagnosis not present

## 2024-06-27 DIAGNOSIS — Z888 Allergy status to other drugs, medicaments and biological substances status: Secondary | ICD-10-CM

## 2024-06-27 DIAGNOSIS — G4089 Other seizures: Secondary | ICD-10-CM | POA: Diagnosis not present

## 2024-06-27 DIAGNOSIS — E872 Acidosis, unspecified: Secondary | ICD-10-CM | POA: Diagnosis present

## 2024-06-27 DIAGNOSIS — E871 Hypo-osmolality and hyponatremia: Secondary | ICD-10-CM | POA: Diagnosis present

## 2024-06-27 DIAGNOSIS — J13 Pneumonia due to Streptococcus pneumoniae: Secondary | ICD-10-CM | POA: Diagnosis present

## 2024-06-27 DIAGNOSIS — R4182 Altered mental status, unspecified: Secondary | ICD-10-CM | POA: Diagnosis not present

## 2024-06-27 DIAGNOSIS — Z7985 Long-term (current) use of injectable non-insulin antidiabetic drugs: Secondary | ICD-10-CM

## 2024-06-27 DIAGNOSIS — F1910 Other psychoactive substance abuse, uncomplicated: Secondary | ICD-10-CM | POA: Diagnosis present

## 2024-06-27 DIAGNOSIS — R7881 Bacteremia: Secondary | ICD-10-CM | POA: Diagnosis not present

## 2024-06-27 DIAGNOSIS — R9431 Abnormal electrocardiogram [ECG] [EKG]: Secondary | ICD-10-CM | POA: Diagnosis not present

## 2024-06-27 DIAGNOSIS — E876 Hypokalemia: Secondary | ICD-10-CM | POA: Insufficient documentation

## 2024-06-27 DIAGNOSIS — F419 Anxiety disorder, unspecified: Secondary | ICD-10-CM | POA: Diagnosis not present

## 2024-06-27 DIAGNOSIS — D75838 Other thrombocytosis: Secondary | ICD-10-CM | POA: Diagnosis not present

## 2024-06-27 DIAGNOSIS — I952 Hypotension due to drugs: Secondary | ICD-10-CM | POA: Diagnosis not present

## 2024-06-27 DIAGNOSIS — E785 Hyperlipidemia, unspecified: Secondary | ICD-10-CM | POA: Diagnosis present

## 2024-06-27 DIAGNOSIS — T4275XA Adverse effect of unspecified antiepileptic and sedative-hypnotic drugs, initial encounter: Secondary | ICD-10-CM | POA: Diagnosis not present

## 2024-06-27 DIAGNOSIS — G9341 Metabolic encephalopathy: Secondary | ICD-10-CM | POA: Diagnosis not present

## 2024-06-27 DIAGNOSIS — E663 Overweight: Secondary | ICD-10-CM | POA: Diagnosis present

## 2024-06-27 DIAGNOSIS — D649 Anemia, unspecified: Secondary | ICD-10-CM | POA: Diagnosis not present

## 2024-06-27 DIAGNOSIS — Z9049 Acquired absence of other specified parts of digestive tract: Secondary | ICD-10-CM

## 2024-06-27 DIAGNOSIS — B953 Streptococcus pneumoniae as the cause of diseases classified elsewhere: Secondary | ICD-10-CM

## 2024-06-27 DIAGNOSIS — Z751 Person awaiting admission to adequate facility elsewhere: Secondary | ICD-10-CM

## 2024-06-27 DIAGNOSIS — Z6827 Body mass index (BMI) 27.0-27.9, adult: Secondary | ICD-10-CM

## 2024-06-27 DIAGNOSIS — Z5320 Procedure and treatment not carried out because of patient's decision for unspecified reasons: Secondary | ICD-10-CM | POA: Diagnosis present

## 2024-06-27 DIAGNOSIS — F1410 Cocaine abuse, uncomplicated: Secondary | ICD-10-CM | POA: Diagnosis present

## 2024-06-27 DIAGNOSIS — Z8349 Family history of other endocrine, nutritional and metabolic diseases: Secondary | ICD-10-CM

## 2024-06-27 DIAGNOSIS — Z803 Family history of malignant neoplasm of breast: Secondary | ICD-10-CM

## 2024-06-27 DIAGNOSIS — E8770 Fluid overload, unspecified: Secondary | ICD-10-CM | POA: Diagnosis not present

## 2024-06-27 DIAGNOSIS — Z8249 Family history of ischemic heart disease and other diseases of the circulatory system: Secondary | ICD-10-CM

## 2024-06-27 LAB — BLOOD GAS, ARTERIAL
Acid-base deficit: 3.9 mmol/L — ABNORMAL HIGH (ref 0.0–2.0)
Acid-base deficit: 6.3 mmol/L — ABNORMAL HIGH (ref 0.0–2.0)
Bicarbonate: 21.6 mmol/L (ref 20.0–28.0)
Bicarbonate: 23.3 mmol/L (ref 20.0–28.0)
FIO2: 100 %
MECHVT: 400 mL
O2 Content: 60 L/min
O2 Saturation: 99 %
O2 Saturation: 96.6 %
PEEP: 12 cmH2O
Patient temperature: 37
Patient temperature: 37
pCO2 arterial: 40 mmHg (ref 32–48)
pCO2 arterial: 64 mmHg — ABNORMAL HIGH (ref 32–48)
pH, Arterial: 7.34 — ABNORMAL LOW (ref 7.35–7.45)
pH, Arterial: 7.17 — CL (ref 7.35–7.45)
pO2, Arterial: 89 mmHg (ref 83–108)
pO2, Arterial: 83 mmHg (ref 83–108)

## 2024-06-27 LAB — URINALYSIS, COMPLETE (UACMP) WITH MICROSCOPIC
Bacteria, UA: NONE SEEN
Bilirubin Urine: NEGATIVE
Glucose, UA: NEGATIVE mg/dL
Hgb urine dipstick: NEGATIVE
Ketones, ur: NEGATIVE mg/dL
Leukocytes,Ua: NEGATIVE
Nitrite: NEGATIVE
Protein, ur: 30 mg/dL — AB
Specific Gravity, Urine: 1.013 (ref 1.005–1.030)
pH: 5 (ref 5.0–8.0)

## 2024-06-27 LAB — BASIC METABOLIC PANEL WITH GFR
Anion gap: 18 — ABNORMAL HIGH (ref 5–15)
BUN: 67 mg/dL — ABNORMAL HIGH (ref 6–20)
CO2: 22 mmol/L (ref 22–32)
Calcium: 8.9 mg/dL (ref 8.9–10.3)
Chloride: 94 mmol/L — ABNORMAL LOW (ref 98–111)
Creatinine, Ser: 2.05 mg/dL — ABNORMAL HIGH (ref 0.44–1.00)
GFR, Estimated: 30 mL/min — ABNORMAL LOW (ref >60)
Glucose, Bld: 109 mg/dL — ABNORMAL HIGH (ref 70–99)
Potassium: 3.1 mmol/L — ABNORMAL LOW (ref 3.5–5.1)
Sodium: 133 mmol/L — ABNORMAL LOW (ref 135–145)

## 2024-06-27 LAB — RESP PANEL BY RT-PCR (RSV, FLU A&B, COVID)  RVPGX2
Influenza A by PCR: NEGATIVE
Influenza B by PCR: NEGATIVE
Resp Syncytial Virus by PCR: NEGATIVE
SARS Coronavirus 2 by RT PCR: NEGATIVE

## 2024-06-27 LAB — URINE DRUG SCREEN
Amphetamines: NEGATIVE
Barbiturates: NEGATIVE
Benzodiazepines: NEGATIVE
Cocaine: POSITIVE — AB
Fentanyl: POSITIVE — AB
Methadone Scn, Ur: NEGATIVE
Opiates: NEGATIVE
Tetrahydrocannabinol: NEGATIVE

## 2024-06-27 LAB — CBC WITH DIFFERENTIAL/PLATELET
Abs Immature Granulocytes: 1.11 K/uL — ABNORMAL HIGH (ref 0.00–0.07)
Basophils Absolute: 0 K/uL (ref 0.0–0.1)
Basophils Relative: 0 %
Eosinophils Absolute: 0 K/uL (ref 0.0–0.5)
Eosinophils Relative: 0 %
HCT: 33.5 % — ABNORMAL LOW (ref 36.0–46.0)
Hemoglobin: 11.3 g/dL — ABNORMAL LOW (ref 12.0–15.0)
Immature Granulocytes: 5 %
Lymphocytes Relative: 4 %
Lymphs Abs: 0.8 K/uL (ref 0.7–4.0)
MCH: 30.2 pg (ref 26.0–34.0)
MCHC: 33.7 g/dL (ref 30.0–36.0)
MCV: 89.6 fL (ref 80.0–100.0)
Monocytes Absolute: 0.1 K/uL (ref 0.1–1.0)
Monocytes Relative: 0 %
Neutro Abs: 18.7 K/uL — ABNORMAL HIGH (ref 1.7–7.7)
Neutrophils Relative %: 91 %
Platelets: 399 K/uL (ref 150–400)
RBC: 3.74 MIL/uL — ABNORMAL LOW (ref 3.87–5.11)
RDW: 13.8 % (ref 11.5–15.5)
Smear Review: NORMAL
WBC: 20.8 K/uL — ABNORMAL HIGH (ref 4.0–10.5)
nRBC: 0 % (ref 0.0–0.2)

## 2024-06-27 LAB — EXPECTORATED SPUTUM ASSESSMENT W GRAM STAIN, RFLX TO RESP C

## 2024-06-27 LAB — LACTIC ACID, PLASMA: Lactic Acid, Venous: 2.4 mmol/L (ref 0.5–1.9)

## 2024-06-27 LAB — MRSA NEXT GEN BY PCR, NASAL: MRSA by PCR Next Gen: NOT DETECTED

## 2024-06-27 LAB — GLUCOSE, CAPILLARY
Glucose-Capillary: 140 mg/dL — ABNORMAL HIGH (ref 70–99)
Glucose-Capillary: 152 mg/dL — ABNORMAL HIGH (ref 70–99)
Glucose-Capillary: 83 mg/dL (ref 70–99)

## 2024-06-27 LAB — TROPONIN T, HIGH SENSITIVITY: Troponin T High Sensitivity: <15 ng/L (ref 0–19)

## 2024-06-27 MED ORDER — LORAZEPAM 1 MG PO TABS
0.5000 mg | ORAL_TABLET | Freq: Three times a day (TID) | ORAL | Status: DC | PRN
  Administered 2024-06-27: 0.5 mg via ORAL
  Filled 2024-06-27: qty 1

## 2024-06-27 MED ORDER — POTASSIUM CHLORIDE 2 MEQ/ML IV SOLN
INTRAVENOUS | Status: DC
  Filled 2024-06-27: qty 1000

## 2024-06-27 MED ORDER — SODIUM CHLORIDE 0.9 % IV SOLN
500.0000 mg | INTRAVENOUS | Status: AC
  Administered 2024-06-27 – 2024-07-02 (×5): 500 mg via INTRAVENOUS
  Filled 2024-06-27 (×6): qty 5

## 2024-06-27 MED ORDER — ORAL CARE MOUTH RINSE: 15.0000 mL | OROMUCOSAL | Status: DC | PRN

## 2024-06-27 MED ORDER — NOREPINEPHRINE 4 MG/250ML-% IV SOLN
0.0000 ug/min | INTRAVENOUS | Status: DC
  Administered 2024-06-27 – 2024-06-28 (×2): 4 ug/min via INTRAVENOUS
  Administered 2024-06-28: 07:00:00 8 ug/min via INTRAVENOUS
  Filled 2024-06-27 (×2): qty 250

## 2024-06-27 MED ORDER — LACTATED RINGERS IV BOLUS
1000.0000 mL | Freq: Once | INTRAVENOUS | Status: AC
  Administered 2024-06-27: 1000 mL via INTRAVENOUS

## 2024-06-27 MED ORDER — HEPARIN SODIUM (PORCINE) 5000 UNIT/ML IJ SOLN
5000.0000 [IU] | Freq: Three times a day (TID) | INTRAMUSCULAR | Status: DC
  Administered 2024-06-27 – 2024-07-05 (×24): 5000 [IU] via SUBCUTANEOUS
  Filled 2024-06-27 (×24): qty 1

## 2024-06-27 MED ORDER — ROCURONIUM BROMIDE 10 MG/ML (PF) SYRINGE
PREFILLED_SYRINGE | INTRAVENOUS | Status: AC
  Filled 2024-06-27: qty 10

## 2024-06-27 MED ORDER — POTASSIUM CHLORIDE IN NACL 20-0.9 MEQ/L-% IV SOLN
INTRAVENOUS | Status: AC
  Filled 2024-06-27 (×3): qty 1000

## 2024-06-27 MED ORDER — SODIUM CHLORIDE 0.9 % IV SOLN
1.0000 g | Freq: Once | INTRAVENOUS | Status: AC
  Administered 2024-06-27: 1 g via INTRAVENOUS
  Filled 2024-06-27: qty 10

## 2024-06-27 MED ORDER — BUPRENORPHINE HCL-NALOXONE HCL 2-0.5 MG SL SUBL
1.0000 | SUBLINGUAL_TABLET | Freq: Every day | SUBLINGUAL | Status: DC
  Filled 2024-06-27: qty 1

## 2024-06-27 MED ORDER — NOREPINEPHRINE 4 MG/250ML-% IV SOLN: 0.0000 ug/min | INTRAVENOUS | Status: DC

## 2024-06-27 MED ORDER — HYDROCORTISONE SOD SUC (PF) 100 MG IJ SOLR
50.0000 mg | Freq: Four times a day (QID) | INTRAMUSCULAR | Status: DC
  Administered 2024-06-27 – 2024-06-29 (×9): 50 mg via INTRAVENOUS
  Filled 2024-06-27: qty 1
  Filled 2024-06-27 (×2): qty 2
  Filled 2024-06-27: qty 1
  Filled 2024-06-27 (×7): qty 2

## 2024-06-27 MED ORDER — PROPOFOL 1000 MG/100ML IV EMUL
0.0000 ug/kg/min | INTRAVENOUS | Status: DC
  Administered 2024-06-27: 30 ug/kg/min via INTRAVENOUS
  Administered 2024-06-28: 20:00:00 40 ug/kg/min via INTRAVENOUS
  Administered 2024-06-28: 13:00:00 45 ug/kg/min via INTRAVENOUS
  Administered 2024-06-28: 01:00:00 30 ug/kg/min via INTRAVENOUS
  Administered 2024-06-28: 08:00:00 45 ug/kg/min via INTRAVENOUS
  Administered 2024-06-29: 08:00:00 80 ug/kg/min via INTRAVENOUS
  Administered 2024-06-29: 12:00:00 60 ug/kg/min via INTRAVENOUS
  Administered 2024-06-29: 05:00:00 70 ug/kg/min via INTRAVENOUS
  Administered 2024-06-29: 01:00:00 40 ug/kg/min via INTRAVENOUS
  Administered 2024-06-30: 21:00:00 50 ug/kg/min via INTRAVENOUS
  Administered 2024-06-30: 09:00:00 10 ug/kg/min via INTRAVENOUS
  Administered 2024-06-30 – 2024-07-02 (×9): 50 ug/kg/min via INTRAVENOUS
  Administered 2024-07-03: 10 ug/kg/min via INTRAVENOUS
  Administered 2024-07-03 (×2): 25 ug/kg/min via INTRAVENOUS
  Administered 2024-07-04: 30 ug/kg/min via INTRAVENOUS
  Administered 2024-07-04: 45 ug/kg/min via INTRAVENOUS
  Administered 2024-07-04: 20 ug/kg/min via INTRAVENOUS
  Administered 2024-07-04 (×2): 35 ug/kg/min via INTRAVENOUS
  Administered 2024-07-05: 70 ug/kg/min via INTRAVENOUS
  Administered 2024-07-05: 35 ug/kg/min via INTRAVENOUS
  Administered 2024-07-05 (×3): 70 ug/kg/min via INTRAVENOUS
  Administered 2024-07-06: 40 ug/kg/min via INTRAVENOUS
  Administered 2024-07-06: 70 ug/kg/min via INTRAVENOUS
  Administered 2024-07-06: 80 ug/kg/min via INTRAVENOUS
  Administered 2024-07-06: 50 ug/kg/min via INTRAVENOUS
  Administered 2024-07-06: 80 ug/kg/min via INTRAVENOUS
  Administered 2024-07-06: 50 ug/kg/min via INTRAVENOUS
  Administered 2024-07-07: 40 ug/kg/min via INTRAVENOUS
  Administered 2024-07-07: 50 ug/kg/min via INTRAVENOUS
  Administered 2024-07-07: 35 ug/kg/min via INTRAVENOUS
  Administered 2024-07-07: 25 ug/kg/min via INTRAVENOUS
  Administered 2024-07-08: 30 ug/kg/min via INTRAVENOUS
  Filled 2024-06-27 (×47): qty 100

## 2024-06-27 MED ORDER — PIPERACILLIN-TAZOBACTAM 3.375 G IVPB
3.3750 g | Freq: Three times a day (TID) | INTRAVENOUS | Status: DC
  Administered 2024-06-27: 3.375 g via INTRAVENOUS
  Filled 2024-06-27 (×3): qty 50

## 2024-06-27 MED ORDER — LINEZOLID 600 MG/300ML IV SOLN
600.0000 mg | Freq: Two times a day (BID) | INTRAVENOUS | Status: DC
  Administered 2024-06-27: 600 mg via INTRAVENOUS
  Filled 2024-06-27 (×4): qty 300

## 2024-06-27 MED ORDER — LINEZOLID 600 MG/300ML IV SOLN
600.0000 mg | Freq: Two times a day (BID) | INTRAVENOUS | Status: DC
  Administered 2024-06-28: 06:00:00 600 mg via INTRAVENOUS
  Filled 2024-06-27 (×2): qty 300

## 2024-06-27 MED ORDER — NOREPINEPHRINE 4 MG/250ML-% IV SOLN
INTRAVENOUS | Status: AC
  Filled 2024-06-27: qty 250

## 2024-06-27 MED ORDER — ATORVASTATIN CALCIUM 20 MG PO TABS
10.0000 mg | ORAL_TABLET | Freq: Every day | ORAL | Status: DC
  Administered 2024-06-27 – 2024-06-28 (×2): 10 mg via ORAL
  Filled 2024-06-27 (×2): qty 1

## 2024-06-27 MED ORDER — FENTANYL 2500MCG IN NS 250ML (10MCG/ML) PREMIX INFUSION
INTRAVENOUS | Status: AC
  Filled 2024-06-27: qty 250

## 2024-06-27 MED ORDER — CHLORHEXIDINE GLUCONATE CLOTH 2 % EX PADS
6.0000 | MEDICATED_PAD | Freq: Every day | CUTANEOUS | Status: DC
  Administered 2024-06-28 – 2024-07-15 (×18): 6 via TOPICAL

## 2024-06-27 MED ORDER — ORAL CARE MOUTH RINSE
15.0000 mL | OROMUCOSAL | Status: DC
  Administered 2024-06-27 – 2024-07-11 (×163): 15 mL via OROMUCOSAL

## 2024-06-27 MED ORDER — ACETAMINOPHEN 10 MG/ML IV SOLN
1000.0000 mg | Freq: Four times a day (QID) | INTRAVENOUS | Status: AC
  Administered 2024-06-27 – 2024-06-28 (×4): 1000 mg via INTRAVENOUS
  Filled 2024-06-27 (×4): qty 100

## 2024-06-27 MED ORDER — POTASSIUM CHLORIDE 10 MEQ/100ML IV SOLN
10.0000 meq | INTRAVENOUS | Status: AC
  Filled 2024-06-27: qty 100

## 2024-06-27 MED ORDER — FENTANYL 2500MCG IN NS 250ML (10MCG/ML) PREMIX INFUSION
0.0000 ug/h | INTRAVENOUS | Status: DC
  Administered 2024-06-27: 150 ug/h via INTRAVENOUS

## 2024-06-27 MED ORDER — PROPOFOL 1000 MG/100ML IV EMUL
INTRAVENOUS | Status: AC
  Filled 2024-06-27: qty 100

## 2024-06-27 MED ORDER — ETOMIDATE 2 MG/ML IV SOLN
INTRAVENOUS | Status: AC
  Administered 2024-06-27: 20 mg
  Filled 2024-06-27: qty 20

## 2024-06-27 MED ORDER — ENOXAPARIN SODIUM 40 MG/0.4ML IJ SOSY: 40.0000 mg | PREFILLED_SYRINGE | INTRAMUSCULAR | Status: DC

## 2024-06-27 MED ORDER — POTASSIUM CHLORIDE 10 MEQ/100ML IV SOLN
10.0000 meq | INTRAVENOUS | Status: AC
  Administered 2024-06-27 (×2): 10 meq via INTRAVENOUS
  Filled 2024-06-27 (×4): qty 100

## 2024-06-27 MED ORDER — SODIUM CHLORIDE 0.9 % IV SOLN
500.0000 mg | Freq: Once | INTRAVENOUS | Status: DC
  Filled 2024-06-27: qty 5

## 2024-06-27 MED ORDER — PIPERACILLIN-TAZOBACTAM 3.375 G IVPB
3.3750 g | Freq: Three times a day (TID) | INTRAVENOUS | Status: DC
  Administered 2024-06-28 (×2): 3.375 g via INTRAVENOUS
  Filled 2024-06-27 (×2): qty 50

## 2024-06-27 NOTE — Procedures (Signed)
 Intubation Procedure Note  [name] Phillips  [id number]  30-Oct-1980  Date:06/27/2024  Time:6:48 PM   Provider Performing:Faizon Capozzi    Procedure: Intubation (31500)  Indication(s) Respiratory Failure  Consent Risks of the procedure as well as the alternatives and risks of each were explained to the patient and/or caregiver.  Consent for the procedure was obtained and is signed in the bedside chart   Anesthesia Etomidate  and Rocuronium    Time Out Verified patient identification, verified procedure, site/side was marked, verified correct patient position, special equipment/implants available, medications/allergies/relevant history reviewed, required imaging and test results available.   Sterile Technique Usual hand hygeine, masks, and gloves were used   Procedure Description Patient positioned in bed supine.  Sedation given as noted above.  Patient was intubated with endotracheal tube using Glidescope.  View was Grade 1 full glottis .  Number of attempts was 1.  Colorimetric CO2 detector was consistent with tracheal placement.   Complications/Tolerance None; patient tolerated the procedure well. Chest X-ray is ordered to verify placement.   EBL Minimal   Specimen(s) None  [name], MD [facility] 06/27/2024 6:48 PM

## 2024-06-27 NOTE — Progress Notes (Signed)
 A stat consult was placed to the hospital's IV Nurse for additional IV access; pt seen at 1505 and was having an echo done, and respiratory treatment in the ER;  pt to be going to ICU per RN;  will attempt 2nd iv site when able.

## 2024-06-27 NOTE — ED Notes (Signed)
 ECHO tech at bedside.

## 2024-06-27 NOTE — Procedures (Signed)
 ARTERIAL CATHETER INSERTION PROCEDURE NOTE  [name] Phillips  [id number]  09/23/1980  Date:06/27/2024  Time:8:34 PM   Provider Performing: Almarie DELENA Nose   Procedure: Insertion of Arterial Line (63379) with US  guidance (23062)   Indication(s) Blood pressure monitoring and/or need for frequent ABGs  Consent Risks of the procedure as well as the alternatives and risks of each were explained to the patient and/or caregiver.  Consent for the procedure was obtained and is signed in the bedside chart  Anesthesia None  Time Out Verified patient identification, verified procedure, site/side was marked, verified correct patient position, special equipment/implants available, medications/allergies/relevant history reviewed, required imaging and test results available.  Sterile Technique Maximal sterile technique including full sterile barrier drape, hand hygiene, sterile gown, sterile gloves, mask, hair covering, sterile ultrasound probe cover (if used).  Procedure Description Area of catheter insertion was cleaned with chlorhexidine  and draped in sterile fashion. With real-time ultrasound guidance an arterial catheter was placed into the right radial artery.  Appropriate arterial tracings confirmed on monitor.    Complications/Tolerance None; patient tolerated the procedure well.  EBL Minimal  Specimen(s) None     Almarie Nose DNP, CCRN, FNP-C, AGACNP-BC Acute Care & Family Nurse Practitioner [facility] PCCM on call pager [phone]

## 2024-06-27 NOTE — Progress Notes (Signed)
 Patient with worsening respiratory status with increased work of breathing, worsening hypoxia, and increased tachycardia. With her very severe pneumonia we would need to escalate therapy further with intubation, mechanical ventilation, and consideration for bronchoscopy to clear airway secretions. Discussed with the patient and her husband at bedside and she consents to proceed.  [name], MD [facility] 06/27/2024 6:33 PM

## 2024-06-27 NOTE — Progress Notes (Signed)
 Patient verbally consented for emergent RSI and bronch. Time out performed.

## 2024-06-27 NOTE — Progress Notes (Signed)
° °  Brief Progress Note   _____________________________________________________________________________________________________________  Patient Name: [name] Phillips Patient DOB: 10/01/1980 Date: @TODAY @      Data: Reviewed vital signs, labs, and clinical notes.    Action: No action required at this time.    Response:  Bed assigned.  _____________________________________________________________________________________________________________  [facility] RN Expeditor Priseis Cratty S Rosalita Carey Please contact us  directly via secure chat (search for [facility]) or by calling us  at [phone] [facility]).

## 2024-06-27 NOTE — Progress Notes (Signed)
 Echocardiogram 2D Echocardiogram has been performed.  Maralee Higuchi N Nicolas Sisler,RDCS 06/27/2024, 3:29 PM

## 2024-06-27 NOTE — ED Notes (Signed)
 Unable to obtain second set of cultures. Antibiotics started

## 2024-06-27 NOTE — ED Triage Notes (Signed)
 Pt to ED from [facility] for hypoxia and hypotension. Had URI starting 1 week ago which resolved. Then pt began having generalized weakness and SOB starting yesterday morning. Pt is 72% on RA. Placed on 4L, remained 81%, placed on 6L, 85%. Placed on NRB at 15L. Denies CP.  Respirations are unlabored. Called for room.

## 2024-06-27 NOTE — H&P (Signed)
 History and Physical    Patient: [name] Phillips FMW:[id number] DOB: [birth year] DOA: 06/27/2024 DOS: the patient was seen and examined on 06/27/2024 PCP: Corwin Antu, FNP  Patient coming from: Home  Chief Complaint:  Chief Complaint  Patient presents with   Shortness of Breath   Hypotension   HPI: [name] Phillips is a 43 y.o. female with medical history significant of essential hypertension, seizure disorder, polysubstance abuse, recent use of fentanyl  and heroin 48 hours ago, who presents to the hospital with progressively worsening short of breath, hypoxia.    Patient states that she had upper respiratory infection about a week ago, had a high fever on the first day of infection.  Since then, she has progressively worsening shortness of breath.  She also has a cough with large amount of yellow mucus.  No hemoptysis.  She snored fentanyl  and heroin 48 hours ago. She no longer has any fever, she has no nausea vomiting, she has no diarrhea.  She did not have any urinary symptoms.  Upon arriving to hospital, her blood pressure dropped down to 69/55, heart rate 84, respiratory rate 23.  Lab results showed severe leukocytosis of 20.8, lactic acid 2.4, sodium 133, potassium 3.1, creatinine 2.05.  Chest x-ray showed a widening right sided lung, with scattered infiltrate on the left.  In the ED, patient had a 2 L IV fluid bolus, blood pressure is better.  Patient was also given Rocephin  and Zithromax .  Review of Systems: As mentioned in the history of present illness. All other systems reviewed and are negative. Past Medical History:  Diagnosis Date   Alcohol abuse 11/02/2018   Hypertension    Seizure [facility])    sees dr maree at [facility], no longer has them. 9 years ago.   Past Surgical History:  Procedure Laterality Date   CARPAL TUNNEL RELEASE Right 04/11/2021   Procedure: CARPAL TUNNEL RELEASE ENDOSCOPIC;  Surgeon: Edie Norleen PARAS, MD;  Location: [facility] ORS;  Service: Orthopedics;  Laterality:  Right;  Pateint requesting 1st case of the day   CERVICAL SPINE SURGERY  2019   CHOLECYSTECTOMY     Social History:  reports that she quit smoking about 4 years ago. Her smoking use included cigarettes and e-cigarettes. She started smoking about 29 years ago. She has a 25 pack-year smoking history. She has never used smokeless tobacco. She reports that she does not currently use alcohol. She reports that she does not currently use drugs.  Allergies[1]  Family History  Problem Relation Age of Onset   Alcohol abuse Other    Hyperlipidemia Mother    Hypertension Mother    COPD Father    Heart disease Father    Hypertension Father    Breast cancer Maternal Aunt 19    Prior to Admission medications  Medication Sig Start Date End Date Taking? Authorizing Provider  Buprenorphine  HCl-Naloxone  HCl 2-0.5 MG FILM Place 0.5 Film under the tongue as directed. 06/17/24  Yes [provider]  gabapentin (NEURONTIN) 300 MG capsule Take 300 mg by mouth 3 (three) times daily as needed. 04/21/24  Yes [provider]  hydrOXYzine  (VISTARIL ) 25 MG capsule Take 25 mg by mouth 3 (three) times daily. 06/17/24  Yes [provider]  atorvastatin  (LIPITOR) 10 MG tablet Take 1 tablet (10 mg total) by mouth daily. MUST HAVE OV FOR FURTHER REFILLS 10/29/23   Corwin Antu, FNP  bisoprolol -hydrochlorothiazide  (ZIAC ) 5-6.25 MG tablet TAKE ONE TABLET BY MOUTH ONCE DAILY 03/18/24   Corwin Antu, FNP  cetirizine (ZYRTEC) 10 MG tablet Take 10 mg by mouth daily.    [provider]  ciclopirox  (PENLAC ) 8 % solution Apply topically at bedtime. Apply over nail and surrounding skin. Apply daily over previous coat. After seven (7) days, may remove with alcohol and continue cycle. 02/25/22   Dugal, Tabitha, FNP  cloNIDine  (CATAPRES ) 0.1 MG tablet Take 0.1 mg by mouth 3 (three) times daily as needed.    [provider]  Melatonin 12 MG TABS Take 3 mg by mouth at bedtime.    [provider]  Multiple Vitamin (MULTI-VITAMIN) tablet Take 1 tablet by mouth daily.    [provider]  omeprazole  (PRILOSEC) 20 MG capsule Take 1 capsule (20 mg total) by mouth daily. 10/29/23   Dugal, Tabitha, FNP  Semaglutide ,0.25 or 0.5MG /DOS, (OZEMPIC , 0.25 OR 0.5 MG/DOSE,) 2 MG/1.5ML SOPN Inject 0.25 mg into the skin. Bi-weekly    [provider]  sertraline  (ZOLOFT ) 100 MG tablet Take two tablets once daily 11/03/23   Dugal, Tabitha, FNP    Physical Exam: Vitals:   06/27/24 1025 06/27/24 1100 06/27/24 1127 06/27/24 1130  BP:  (!) 92/55  (!) 69/55  Pulse:  84  77  Resp:  (!) 23  16  Temp:      TempSrc:      SpO2:      Weight: 73.5 kg  73.5 kg   Height: 5' 4 (1.626 m)  5' 4 (1.626 m)    Physical Exam Constitutional:      General: She is in acute distress.     Appearance: She is ill-appearing. She is not toxic-appearing or diaphoretic.  HENT:     Head: Normocephalic and atraumatic.  Eyes:     Extraocular Movements: Extraocular movements intact.     Pupils: Pupils are equal, round, and reactive to light.  Neck:     Thyroid : No thyromegaly.     Vascular: No hepatojugular reflux or JVD.     Trachea: No tracheal deviation.  Cardiovascular:     Rate and Rhythm: Normal rate and regular rhythm.     Heart sounds: No murmur heard.    No gallop.  Pulmonary:     Comments: Tachypnea with labored breathing, significant decreased breath sounds on the right.  A few crackles on left. Abdominal:     General: Bowel sounds are normal.     Palpations: Abdomen is soft. There is no hepatomegaly, splenomegaly or mass.     Tenderness: There is no abdominal tenderness.  Musculoskeletal:        General: Normal range of motion.     Cervical back: Normal range of motion and neck supple.     Right lower leg: No edema.     Left lower leg: No edema.  Lymphadenopathy:     Cervical: No cervical adenopathy.  Skin:    General: Skin is warm and dry.     Coloration: Skin is not  cyanotic.  Neurological:     General: No focal deficit present.     Mental Status: She is alert and oriented to person, place, and time.  Psychiatric:        Mood and Affect: Mood is anxious.        Behavior: Behavior normal.     Data Reviewed:  X-ray and lab results as above.  Assessment and Plan: Acute respiratory failure with hypoxemia. Severe right sided pneumonia with possible empyema. Severe sepsis secondary to pneumonia. Patient met severe sepsis criteria with tachycardia, tachypnea,  severe leukocytosis, and acute respiratory failure. Patient has been receiving fluids, blood pressure is better now.  Lactic acid elevated at 2.4. Patient has polydrug abuse, with most recent use 48 hours ago.  Patient potentially can have aspiration pneumonia with the possibility of empyema.  I will obtain a CT of the chest without contrast to identify possible pleural effusion.  Consult from ICU/pulmonology would be also obtained. Patient will have a thoracentesis if pleural effusion is identified. To cover for aspiration organism, patient will be treated with Zosyn . Patient condition is critical, will be followed closely in the ICU stepdown unit. I will also obtain a VBG to see if patient has significant CO2 retention, will place on BiPAP if there is significant CO2 retention.  Acute renal failure secondary to severe sepsis. Hyponatremia. Hypokalemia. Patient has received IV fluid bolus, will continue IV fluids.  Replete potassium, monitor electrolytes including potassium, magnesium  and phosphate.  Polysubstance abuse including heroin and fentanyl . Continue to monitor, watch for withdrawal.  Essential hypertension. Hold off on blood pressure medicine due to hypotension.  Overweight with BMI 27.81. Diet exercise will be discussed once condition stable.   Advance Care Planning:   Code Status: Full Code patient wished to be full code.  Consults: ICU/pulmonology.  Family Communication:  None  Severity of Illness: CRITICAL CARE Performed by: Murvin Mana   Total critical care time: 45 minutes  Critical care time was exclusive of separately billable procedures and treating other patients.  Critical care was necessary to treat or prevent imminent or life-threatening deterioration.  Critical care was time spent personally by me on the following activities: development of treatment plan with patient and/or surrogate as well as nursing, discussions with consultants, evaluation of patient's response to treatment, examination of patient, obtaining history from patient or surrogate, ordering and performing treatments and interventions, ordering and review of laboratory studies, ordering and review of radiographic studies, pulse oximetry and re-evaluation of patient's condition.   Author: Murvin Mana, MD 06/27/2024 1:22 PM  For on call review [email or web address].     [1]  Allergies Allergen Reactions   Wellbutrin  [Bupropion ]     Suicidal thoughts    Chantix  [Varenicline  Tartrate] Anxiety

## 2024-06-27 NOTE — ED Provider Notes (Signed)
 [facility] Provider Note    Event Date/Time   First MD Initiated Contact with Patient 06/27/24 1034     (approximate)   History   Shortness of Breath and Hypotension   HPI  [name] Phillips is a 43 y.o. female who presents to the emergency department today because of concerns for shortness of breath.  Patient does come from urgent care where they found her to be hypotensive and hypoxic.  She says that she had a fever last week.  The fever then broke a couple of days ago.  Since then however she has had increasing shortness of breath.  Family at bedside states that she will fall asleep even when talking to you and he has noticed some altered mental status as well.  Patient denies any chest pain.  Denies any hemoptysis.  No recent travel. Does state she feels better after being put on supplemental oxygen.      Physical Exam   Triage Vital Signs: ED Triage Vitals  Encounter Vitals Group     BP 06/27/24 1017 (!) 86/55     Girls Systolic BP Percentile --      Girls Diastolic BP Percentile --      Boys Systolic BP Percentile --      Boys Diastolic BP Percentile --      Pulse Rate 06/27/24 1017 78     Resp 06/27/24 1017 20     Temp 06/27/24 1017 97.7 F (36.5 C)     Temp Source 06/27/24 1017 Oral     SpO2 06/27/24 1017 (!) 72 %     Weight 06/27/24 1025 162 lb (73.5 kg)     Height 06/27/24 1025 5' 4 (1.626 m)     Head Circumference --      Peak Flow --      Pain Score 06/27/24 1024 0     Pain Loc --      Pain Education --      Exclude from Growth Chart --     Most recent vital signs: Vitals:   06/27/24 1021 06/27/24 1024  BP:    Pulse:    Resp:    Temp:    SpO2: (!) 81% 90%   General: Awake, alert, oriented. CV:  Good peripheral perfusion. Regular rate and rhythm. Resp:  Increased work of breathing. Diminished breath sounds to the right lung.  Abd:  No distention.  Other:  No lower extremity edema or tenderness.   ED Results /  Procedures / Treatments   Labs (all labs ordered are listed, but only abnormal results are displayed) Labs Reviewed  CBC WITH DIFFERENTIAL/PLATELET - Abnormal; Notable for the following components:      Result Value   WBC 20.8 (*)    RBC 3.74 (*)    Hemoglobin 11.3 (*)    HCT 33.5 (*)    Neutro Abs 18.7 (*)    Abs Immature Granulocytes 1.11 (*)    All other components within normal limits  BASIC METABOLIC PANEL WITH GFR - Abnormal; Notable for the following components:   Sodium 133 (*)    Potassium 3.1 (*)    Chloride 94 (*)    Glucose, Bld 109 (*)    BUN 67 (*)    Creatinine, Ser 2.05 (*)    GFR, Estimated 30 (*)    Anion gap 18 (*)    All other components within normal limits  LACTIC ACID, PLASMA - Abnormal; Notable for the following components:  Lactic Acid, Venous 2.4 (*)    All other components within normal limits  RESP PANEL BY RT-PCR (RSV, FLU A&B, COVID)  RVPGX2  CULTURE, BLOOD (ROUTINE X 2)  CULTURE, BLOOD (ROUTINE X 2)  EXPECTORATED SPUTUM ASSESSMENT W GRAM STAIN, RFLX TO RESP C  ASPERGILLUS ANTIGEN, BAL/SERUM  BLASTOMYCES ANTIGEN  MRSA NEXT GEN BY PCR, NASAL  CULTURE, RESPIRATORY W GRAM STAIN  BLOOD GAS, VENOUS  HIV ANTIBODY (ROUTINE TESTING W REFLEX)  LEGIONELLA PNEUMOPHILA SEROGP 1 UR AG  STREP PNEUMONIAE URINARY ANTIGEN  FUNGITELL BETA-D-GLUCAN  HISTOPLASMA GAL[place]MANNAN AG SER  HISTOPLASMA ANTIGEN, URINE  TROPONIN T, HIGH SENSITIVITY     EKG I, Guadalupe Eagles, attending physician, personally viewed and interpreted this EKG  EKG Time: 1030 Rate: 82 Rhythm: normal sinus rhythm Axis: normal Intervals: qtc 420 QRS: narrow ST changes: no st elevation Impression: normal ekg   RADIOLOGY I independently interpreted and visualized the CXR. My interpretation: large right sided and smaller left sided pneumonia Radiology interpretation:  IMPRESSION:  1. New near complete opacification of the RIGHT hemithorax. This is  favored to reflect a combination  of atelectasis, infection, and  pleural effusion. Recommend follow-up PA and lateral chest  radiograph in 4-6 weeks to assess for resolution.  2. Patchy airspace opacities of the LEFT lung, favored infectious.     PROCEDURES:  Critical Care performed: Yes  CRITICAL CARE Performed by: Guadalupe Eagles   Total critical care time: 35 minutes  Critical care time was exclusive of separately billable procedures and treating other patients.  Critical care was necessary to treat or prevent imminent or life-threatening deterioration.  Critical care was time spent personally by me on the following activities: development of treatment plan with patient and/or surrogate as well as nursing, discussions with consultants, evaluation of patient's response to treatment, examination of patient, obtaining history from patient or surrogate, ordering and performing treatments and interventions, ordering and review of laboratory studies, ordering and review of radiographic studies, pulse oximetry and re-evaluation of patient's condition.   Procedures    MEDICATIONS ORDERED IN ED: Medications - No data to display   IMPRESSION / MDM / ASSESSMENT AND PLAN / ED COURSE  I reviewed the triage vital signs and the nursing notes.                              Differential diagnosis includes, but is not limited to, pneumothorax, PE, pneumonia, COPD  Patient's presentation is most consistent with acute presentation with potential threat to life or bodily function.   The patient is on the cardiac monitor to evaluate for evidence of arrhythmia and/or significant heart rate changes.  She presented to the emergency department today because of concerns for for shortness of breath, coming from urgent care where she was found to be hypotensive and hypoxic.  On exam here patient is hypoxic on room air so put on nonrebreather.  Auscultatory findings shows significantly diminished breath sounds in the right lung.   Chest x-ray concerning for large right-sided pneumonia as well as left sided infiltrates.  Patient's lactic acid and white blood cell count both elevated concerning for infection as well.  She was started on IV antibiotics.  She did feel better on supplemental oxygen.  Discussed with Dr.Zhang with the hospitalist service who will evaluate for admission.      FINAL CLINICAL IMPRESSION(S) / ED DIAGNOSES   Pneumonia AKI Lactic Acidosis Hypoxia    Note:  This  document was prepared using Conservation officer, historic buildings and may include unintentional dictation errors.    Floy Roberts, MD 06/27/24 [phone]

## 2024-06-27 NOTE — Procedures (Addendum)
 Bronchoscopy Procedure Note  JESSIKA ROTHERY  [id number]  16-Jan-1981  Date:06/27/2024  Time:6:59 PM   Provider Performing:Haji Delaine   Procedure(s):  Flexible bronchoscopy with bronchial alveolar lavage (68375)  Indication(s) Respiratory failure  Consent Risks of the procedure as well as the alternatives and risks of each were explained to the patient and/or caregiver.  Consent for the procedure was obtained and is signed in the bedside chart  Anesthesia general   Time Out Verified patient identification, verified procedure, site/side was marked, verified correct patient position, special equipment/implants available, medications/allergies/relevant history reviewed, required imaging and test results available.   Sterile Technique Usual hand hygiene, masks, gowns, and gloves were used   Procedure Description Bronchoscope advanced through endotracheal tube and into airway.  Airways were examined down to subsegmental level with findings noted below.   Following diagnostic evaluation, BAL(s) performed in RLL with normal saline and return of mucoid fluid  Findings: clear airways, no endobronchial secretions. There was an endobronchial raised mucosal lesion in the RLL. BAL in the RLL. Biopsy of the endobronchial lesion was not attempted given severe hypoxia.   Complications/Tolerance None; patient tolerated the procedure well. Chest X-ray is needed post procedure.   EBL Minimal   Specimen(s) BAL in RLL  [name], MD [facility] 06/27/2024 7:00 PM

## 2024-06-27 NOTE — Plan of Care (Signed)
  Problem: Clinical Measurements: Goal: Will remain free from infection Outcome: Progressing Goal: Diagnostic test results will improve Outcome: Progressing Goal: Respiratory complications will improve Outcome: Progressing Goal: Cardiovascular complication will be avoided Outcome: Progressing   Problem: Activity: Goal: Risk for activity intolerance will decrease Outcome: Progressing   Problem: Coping: Goal: Level of anxiety will decrease Outcome: Progressing   

## 2024-06-27 NOTE — Progress Notes (Signed)
 Echocardiogram 2D Echocardiogram was attempted at 2:29pm but patient in CT.  Kaina Orengo N Kiylah Loyer,RDCS 06/27/2024, 2:29 PM

## 2024-06-27 NOTE — Progress Notes (Signed)
 Respiratory care consult ordered.  Entered pts room and she was on a NRB with a Sp02 of 90-91%.  Protocol assessment was completed  pt was placed on a HHFNC on the documented settings.  Pt's Sp02 92-93%.  CT was ordered as well as a ABG.  ABG wasdone at approx 1515 after CT and a good hour on the HHFNC.  I spent 45 min with this pt the first time

## 2024-06-27 NOTE — Consult Note (Signed)
 NAME:  [name] Phillips, MRN:  [id number], DOB:  1980/08/14, LOS: 0 ADMISSION DATE:  06/27/2024, CONSULTATION DATE:  06/27/2024 REFERRING MD:  Murvin Mana, MD, CHIEF COMPLAINT:  Respiratory Failure   History of Present Illness:   43 year old female with history of polysubstance use (IVDU with fentanyl , last injection 4 days ago) who presents with increased shortness of breath and admitted for management of pneumonia.  She reports symptom onset around over a week ago with fevers and chills. This improved with symptomatic management, but she then developed increased shortness of breath and cough over the past couple of days prompting presentation to the ED. She reports a cough productive of yellow sputum. She has not had any sick contacts, nor has she been sick like this in the past.  Patient reports recent travel in November to [place], [place] but denies any hikes or venturing into the desert. She reports recently staying at a hotel here in [place] . She reports a history of IVDU, having used IV fentanyl  4 days ago, and also reports snorting narcotics and vape use. She gets her needles from an exchange program where she also procures the water  used for injection. Denies any history of endocarditis.  Pertinent  Medical History  -IVDU -HTN -HLD  Significant Hospital Events: Including procedures, antibiotic start and stop dates in addition to other pertinent events   06/27/24: admit with right sided pneumonia and respiratory failure. CXR with right lung white out.  Objective    Blood pressure (!) 69/55, pulse 77, temperature 97.7 F (36.5 C), temperature source Oral, resp. rate 16, height 5' 4 (1.626 m), weight 73.5 kg, last menstrual period 06/08/2024, SpO2 90%.       No intake or output data in the 24 hours ending 06/27/24 1400 Filed Weights   06/27/24 1025 06/27/24 1127  Weight: 73.5 kg 73.5 kg    Examination: Physical Exam Constitutional:      General: She is not in acute  distress.    Appearance: She is ill-appearing.  Cardiovascular:     Rate and Rhythm: Regular rhythm. Tachycardia present.     Pulses: Normal pulses.     Heart sounds: Normal heart sounds.  Pulmonary:     Breath sounds: No wheezing.     Comments: Bronchial breath sounds over the right, good air entry over the left. No wheeze Neurological:     General: No focal deficit present.     Mental Status: She is alert. Mental status is at baseline.      Assessment and Plan   #Acute Hypoxic Respiratory Failure #Community Acquired Pneumonia  #Right Lung White Out #Severe Sepsis #History of IVDU  #Polysubstance Use  Neuro - history of IVDU, last use was 4 days ago. On Suboxone , resumed by primary team. CV - severe sepsis secondary to community acquired pneumonia. Resuscitated with 30 cc/kg, and initiated on broad spectrum antibiotics. Lactic acid minimally increased at 2.4, will trend. BP has normalized after fluids but she is at high risk for the development of septic shock. She is also at increased risk for the development of endocarditis given history of IVDU.  -TTE Pulm - acute hypoxic respiratory failure due to severe right sided pneumonia. CXR with full right lung whiteout. Surprisingly, US  of the right lung shows a fully consolidated right lung with no effusion. The lung exhibits hepatization and air bronchograms. Will escalate oxygen therapy to HFNC, obtain infectious workup (see ID below), start antibiotics, and initiate steroids for adjunct therapy (10.1056/[id number] &  10.1056/[id number]).  -hydrocortisone  50 mg q 6 hours  -HFNC GI - NPO for now as high risk for worsening respiratory failure and potential need for mechanical ventilation. Renal - AKI on presentation in the setting of anti-HTN use as well as severe sepsis from PNA. Received IV fluid bolus, will trend kidney function and consider resuming gentle hydration. Endo - start hydrocortisone  for adjunct therapy with  pneumonia (50 q 6 hours) Hem/Onc - switch to heparin  subQ for DVT prophylaxis with AKI ID - with severe right sided pneumonia the differential includes CAP organisms, legionella, and fungal organisms. No effusion or empyema noted on US , chest CT pending. Will attempt to obtain respiratory cultures, legionella/strep/histoplasma urinary antigens, histo/blasto antibodies, and initiate broad spectrum antibiotics (Zosyn /Azithro/Linezolid ). MRSA screen ordered.  -respiratory and blood cultures  -strep/legionella/histo antigens  -beta-d-glucan/aspergillus ag  -zosyn /azithro/linezolid   -MRSA screen  Labs   CBC: Recent Labs  Lab 06/27/24 1042  WBC 20.8*  NEUTROABS 18.7*  HGB 11.3*  HCT 33.5*  MCV 89.6  PLT 399    Basic Metabolic Panel: Recent Labs  Lab 06/27/24 1042  NA 133*  K 3.1*  CL 94*  CO2 22  GLUCOSE 109*  BUN 67*  CREATININE 2.05*  CALCIUM  8.9   GFR: Estimated Creatinine Clearance: 34 mL/min (A) (by C-G formula based on SCr of 2.05 mg/dL (H)). Recent Labs  Lab 06/27/24 1042  WBC 20.8*  LATICACIDVEN 2.4*    Liver Function Tests: No results for input(s): AST, ALT, ALKPHOS, BILITOT, PROT, ALBUMIN in the last 168 hours. No results for input(s): LIPASE, AMYLASE in the last 168 hours. No results for input(s): AMMONIA in the last 168 hours.  ABG No results found for: PHART, PCO2ART, PO2ART, HCO3, TCO2, ACIDBASEDEF, O2SAT   Coagulation Profile: No results for input(s): INR, PROTIME in the last 168 hours.  Cardiac Enzymes: No results for input(s): CKTOTAL, CKMB, CKMBINDEX, TROPONINI in the last 168 hours.  HbA1C: Hemoglobin A1C  Date/Time Value Ref Range Status  07/21/2012 04:48 AM 4.8 4.2 - 6.3 % Final    Comment:    The American Diabetes Association recommends that a primary goal of therapy should be <7% and that physicians should reevaluate the treatment regimen in patients with HbA1c values consistently >8%.      CBG: No results for input(s): GLUCAP in the last 168 hours.  Review of Systems:   N/A  Past Medical History:  She,  has a past medical history of Alcohol abuse (11/02/2018), Hypertension, and Seizure (HCC).   Surgical History:   Past Surgical History:  Procedure Laterality Date   CARPAL TUNNEL RELEASE Right 04/11/2021   Procedure: CARPAL TUNNEL RELEASE ENDOSCOPIC;  Surgeon: Edie Norleen PARAS, MD;  Location: [facility] ORS;  Service: Orthopedics;  Laterality: Right;  Pateint requesting 1st case of the day   CERVICAL SPINE SURGERY  2019   CHOLECYSTECTOMY       Social History:   reports that she quit smoking about 4 years ago. Her smoking use included cigarettes and e-cigarettes. She started smoking about 29 years ago. She has a 25 pack-year smoking history. She has never used smokeless tobacco. She reports that she does not currently use alcohol. She reports that she does not currently use drugs.   Family History:  Her family history includes Alcohol abuse in an other family member; Breast cancer (age of onset: 14) in her maternal aunt; COPD in her father; Heart disease in her father; Hyperlipidemia in her mother; Hypertension in her father and mother.   Allergies  Allergies[1]   Home Medications  Prior to Admission medications  Medication Sig Start Date End Date Taking? Authorizing Provider  atorvastatin  (LIPITOR) 10 MG tablet Take 1 tablet (10 mg total) by mouth daily. MUST HAVE OV FOR FURTHER REFILLS 10/29/23  Yes Dugal, Ginger, FNP  Buprenorphine  HCl-Naloxone  HCl 2-0.5 MG FILM Place 0.5 Film under the tongue daily. 06/17/24  Yes [provider]  cetirizine (ZYRTEC) 10 MG tablet Take 10 mg by mouth daily.   Yes [provider]  cloNIDine  (CATAPRES ) 0.1 MG tablet Take 0.1 mg by mouth 3 (three) times daily as needed.   Yes [provider]  gabapentin (NEURONTIN) 300 MG capsule Take 300 mg by mouth 3 (three) times daily as needed. 04/21/24  Yes [provider]  hydrOXYzine  (VISTARIL ) 25 MG capsule Take 25 mg by mouth 3 (three) times daily. 06/17/24  Yes [provider]  Melatonin 12 MG TABS Take 3 mg by mouth at bedtime.   Yes [provider]  Multiple Vitamin (MULTI-VITAMIN) tablet Take 1 tablet by mouth daily.   Yes [provider]  omeprazole  (PRILOSEC) 20 MG capsule Take 1 capsule (20 mg total) by mouth daily. 10/29/23  Yes Dugal, Tabitha, FNP  sertraline  (ZOLOFT ) 100 MG tablet Take two tablets once daily 11/03/23  Yes Dugal, Tabitha, FNP  bisoprolol -hydrochlorothiazide  (ZIAC ) 5-6.25 MG tablet TAKE ONE TABLET BY MOUTH ONCE DAILY Patient not taking: Reported on 06/27/2024 03/18/24   Corwin Ginger, FNP  ciclopirox  (PENLAC ) 8 % solution Apply topically at bedtime. Apply over nail and surrounding skin. Apply daily over previous coat. After seven (7) days, may remove with alcohol and continue cycle. Patient not taking: Reported on 06/27/2024 02/25/22   Corwin Ginger, FNP  Semaglutide ,0.25 or 0.5MG /DOS, (OZEMPIC , 0.25 OR 0.5 MG/DOSE,) 2 MG/1.5ML SOPN Inject 0.25 mg into the skin. Bi-weekly Patient not taking: Reported on 06/27/2024    [provider]     The patient is critically ill due to acute hypoxic respiratory failure, severe CAP.  Critical care was necessary to treat or prevent imminent or life-threatening deterioration. I personally performed non-invasive positive pressure ventilation management and titration. Critical care time was spent by me on the following activities: development of a treatment plan with the patient and/or surrogate as well as nursing, discussions with consultants, evaluation of the patient's response to treatment, examination of the patient, obtaining a history from the patient or surrogate, ordering and performing treatments and interventions, ordering and review of laboratory studies, ordering and review of radiographic studies, review of telemetry data including pulse oximetry,  re-evaluation of patient's condition and participation in multidisciplinary rounds.   I personally spent 36 minutes providing critical care not including any separately billable procedures.   [name], MD [facility] 06/27/2024 2:18 PM       [1]  Allergies Allergen Reactions   Varenicline  Tartrate Other (See Comments)    varenicline    Wellbutrin  [Bupropion ]     Suicidal thoughts

## 2024-06-28 DIAGNOSIS — J154 Pneumonia due to other streptococci: Secondary | ICD-10-CM | POA: Diagnosis not present

## 2024-06-28 DIAGNOSIS — G9341 Metabolic encephalopathy: Secondary | ICD-10-CM

## 2024-06-28 DIAGNOSIS — J9601 Acute respiratory failure with hypoxia: Secondary | ICD-10-CM | POA: Diagnosis not present

## 2024-06-28 DIAGNOSIS — Z9911 Dependence on respirator [ventilator] status: Secondary | ICD-10-CM

## 2024-06-28 DIAGNOSIS — J9602 Acute respiratory failure with hypercapnia: Secondary | ICD-10-CM

## 2024-06-28 LAB — RESPIRATORY PANEL BY PCR

## 2024-06-28 LAB — BASIC METABOLIC PANEL WITH GFR
Anion gap: 15 (ref 5–15)
BUN: 50 mg/dL — ABNORMAL HIGH (ref 6–20)
CO2: 22 mmol/L (ref 22–32)
Calcium: 8.3 mg/dL — ABNORMAL LOW (ref 8.9–10.3)
Chloride: 100 mmol/L (ref 98–111)
Creatinine, Ser: 1.24 mg/dL — ABNORMAL HIGH (ref 0.44–1.00)
GFR, Estimated: 54 mL/min — ABNORMAL LOW (ref >60)
Glucose, Bld: 159 mg/dL — ABNORMAL HIGH (ref 70–99)
Potassium: 3.5 mmol/L (ref 3.5–5.1)
Sodium: 136 mmol/L (ref 135–145)

## 2024-06-28 LAB — CBC
HCT: 29.5 % — ABNORMAL LOW (ref 36.0–46.0)
Hemoglobin: 10 g/dL — ABNORMAL LOW (ref 12.0–15.0)
MCH: 29.9 pg (ref 26.0–34.0)
MCHC: 33.9 g/dL (ref 30.0–36.0)
MCV: 88.3 fL (ref 80.0–100.0)
Platelets: 438 K/uL — ABNORMAL HIGH (ref 150–400)
RBC: 3.34 MIL/uL — ABNORMAL LOW (ref 3.87–5.11)
RDW: 14.1 % (ref 11.5–15.5)
WBC: 35.4 K/uL — ABNORMAL HIGH (ref 4.0–10.5)
nRBC: 0 % (ref 0.0–0.2)

## 2024-06-28 LAB — BLOOD CULTURE ID PANEL (REFLEXED) - BCID2

## 2024-06-28 LAB — MAGNESIUM: Magnesium: 1.9 mg/dL (ref 1.7–2.4)

## 2024-06-28 LAB — BLOOD GAS, ARTERIAL
Acid-base deficit: 3.2 mmol/L — ABNORMAL HIGH (ref 0.0–2.0)
Bicarbonate: 22.7 mmol/L (ref 20.0–28.0)
FIO2: 80 %
MECHVT: 400 mL
Mechanical Rate: 22
O2 Saturation: 94.4 %
PEEP: 12 cmH2O
Patient temperature: 37
pCO2 arterial: 43 mmHg (ref 32–48)
pH, Arterial: 7.33 — ABNORMAL LOW (ref 7.35–7.45)
pO2, Arterial: 76 mmHg — ABNORMAL LOW (ref 83–108)

## 2024-06-28 LAB — LACTIC ACID, PLASMA: Lactic Acid, Venous: 1.3 mmol/L (ref 0.5–1.9)

## 2024-06-28 LAB — STREP PNEUMONIAE URINARY ANTIGEN: Strep Pneumo Urinary Antigen: POSITIVE — AB

## 2024-06-28 LAB — GLUCOSE, CAPILLARY: Glucose-Capillary: 142 mg/dL — ABNORMAL HIGH (ref 70–99)

## 2024-06-28 LAB — TRIGLYCERIDES: Triglycerides: 354 mg/dL — ABNORMAL HIGH (ref <150)

## 2024-06-28 LAB — PHOSPHORUS: Phosphorus: 6.8 mg/dL — ABNORMAL HIGH (ref 2.5–4.6)

## 2024-06-28 LAB — HIV ANTIBODY (ROUTINE TESTING W REFLEX): HIV Screen 4th Generation wRfx: NONREACTIVE

## 2024-06-28 MED ORDER — IPRATROPIUM-ALBUTEROL 0.5-2.5 (3) MG/3ML IN SOLN
3.0000 mL | Freq: Four times a day (QID) | RESPIRATORY_TRACT | Status: DC
  Administered 2024-06-28 – 2024-07-06 (×31): 3 mL via RESPIRATORY_TRACT
  Filled 2024-06-28 (×30): qty 3

## 2024-06-28 MED ORDER — MIDAZOLAM HCL (PF) 2 MG/2ML IJ SOLN
0.5000 mg | INTRAMUSCULAR | Status: DC | PRN
  Administered 2024-06-28 – 2024-06-29 (×3): 2 mg via INTRAVENOUS
  Filled 2024-06-28 (×4): qty 2

## 2024-06-28 MED ORDER — SODIUM CHLORIDE 0.9 % IV SOLN
2.0000 g | INTRAVENOUS | Status: DC
  Administered 2024-06-28 – 2024-06-30 (×3): 2 g via INTRAVENOUS
  Filled 2024-06-28 (×4): qty 20

## 2024-06-28 MED ORDER — FENTANYL BOLUS VIA INFUSION
100.0000 ug | INTRAVENOUS | Status: DC | PRN
  Administered 2024-06-28: 07:00:00 100 ug via INTRAVENOUS

## 2024-06-28 MED ORDER — PROSOURCE TF20 ENFIT COMPATIBL EN LIQD
60.0000 mL | Freq: Every day | ENTERAL | Status: DC
  Administered 2024-06-29 – 2024-07-08 (×10): 60 mL

## 2024-06-28 MED ORDER — ATORVASTATIN CALCIUM 20 MG PO TABS
10.0000 mg | ORAL_TABLET | Freq: Every day | ORAL | Status: DC
  Administered 2024-06-29 – 2024-07-08 (×10): 10 mg
  Filled 2024-06-28 (×10): qty 1

## 2024-06-28 MED ORDER — FAMOTIDINE 20 MG PO TABS
40.0000 mg | ORAL_TABLET | Freq: Every day | ORAL | Status: DC
  Administered 2024-06-28 – 2024-07-08 (×11): 40 mg
  Filled 2024-06-28 (×11): qty 2

## 2024-06-28 MED ORDER — HYDROMORPHONE HCL-NACL 50-0.9 MG/50ML-% IV SOLN
0.0000 mg/h | INTRAVENOUS | Status: DC
  Administered 2024-06-28: 11:00:00 1 mg/h via INTRAVENOUS
  Administered 2024-06-29: 22:00:00 4 mg/h via INTRAVENOUS
  Administered 2024-06-29: 05:00:00 1 mg/h via INTRAVENOUS
  Administered 2024-06-30: 20:00:00 6 mg/h via INTRAVENOUS
  Administered 2024-06-30: 11:00:00 4 mg/h via INTRAVENOUS
  Administered 2024-07-01: 18:00:00 8 mg/h via INTRAVENOUS
  Administered 2024-07-01: 05:00:00 6 mg/h via INTRAVENOUS
  Administered 2024-07-01 – 2024-07-02 (×3): 8 mg/h via INTRAVENOUS
  Filled 2024-06-28 (×10): qty 50

## 2024-06-28 MED ORDER — FREE WATER
30.0000 mL | Status: DC
  Administered 2024-06-28 – 2024-06-30 (×10): 30 mL

## 2024-06-28 MED ORDER — THIAMINE HCL 100 MG PO TABS
100.0000 mg | ORAL_TABLET | Freq: Every day | ORAL | Status: DC
  Administered 2024-06-29 – 2024-07-08 (×10): 100 mg
  Filled 2024-06-28 (×18): qty 1

## 2024-06-28 MED ORDER — VITAL AF 1.2 CAL PO LIQD
1000.0000 mL | ORAL | Status: DC
  Administered 2024-06-28 – 2024-07-07 (×9): 1000 mL

## 2024-06-28 MED ORDER — FOLIC ACID 1 MG PO TABS
1.0000 mg | ORAL_TABLET | Freq: Every day | ORAL | Status: DC
  Administered 2024-06-29 – 2024-07-08 (×10): 1 mg
  Filled 2024-06-28 (×10): qty 1

## 2024-06-28 MED ORDER — LORAZEPAM 1 MG PO TABS: 0.5000 mg | ORAL_TABLET | Freq: Three times a day (TID) | ORAL | Status: DC | PRN

## 2024-06-28 NOTE — Progress Notes (Signed)
 NAME:  [name] Phillips, MRN:  [id number], DOB:  01/25/1981, LOS: 1 ADMISSION DATE:  06/27/2024  CHIEF COMPLAINT:  severe resp failure   History of Present Illness:   43 year old female with history of polysubstance use (IVDU with fentanyl , last injection 4 days ago) who presents with increased shortness of breath and admitted for management of pneumonia. +COCAINE  Patient with worsening respiratory status with increased work of breathing, worsening hypoxia, and increased tachycardia. With her very severe pneumonia we would need to escalate therapy further with intubation, mechanical ventilation, and consideration for bronchoscopy to clear airway secretions. Discussed with the patient and her husband at bedside and she consents to proceed.     Significant Hospital Events: Including procedures, antibiotic start and stop dates in addition to other pertinent events   06/27/24: admit with right sided pneumonia and respiratory failure. CXR with right lung white out, INTUBATED, S/p BRONCH, ART LINE PLACED 12/14 BLOOD CX +STREP PNEUMONIA 12/15 remains on vent      Micro Data:  12/14 BLOOD CX + STREP PNEUMONIA  Antimicrobials:   Antibiotics Given (last 72 hours)     Date/Time Action Medication Dose Rate   06/27/24 1130 New Bag/Given   cefTRIAXone  (ROCEPHIN ) 1 g in sodium chloride  0.9 % 100 mL IVPB 1 g 200 mL/hr   06/27/24 1449 New Bag/Given   azithromycin  (ZITHROMAX ) 500 mg in sodium chloride  0.9 % 250 mL IVPB 500 mg 250 mL/hr   06/27/24 1744 New Bag/Given   piperacillin -tazobactam (ZOSYN ) IVPB 3.375 g 3.375 g 12.5 mL/hr   06/27/24 1808 New Bag/Given   linezolid  (ZYVOX ) IVPB 600 mg 600 mg 300 mL/hr   06/28/24 0221 New Bag/Given   piperacillin -tazobactam (ZOSYN ) IVPB 3.375 g 3.375 g 12.5 mL/hr   06/28/24 0626 New Bag/Given   linezolid  (ZYVOX ) IVPB 600 mg 600 mg 300 mL/hr            Interim History / Subjective:  Remains critically ill Remains intubated Severe  hypoxia Requires VENT support for survival Vent Mode: PRVC FiO2 (%):  [80 %-100 %] 80 % Set Rate:  [18 bmp-22 bmp] 22 bmp Vt Set:  [400 mL] 400 mL PEEP:  [12 cmH20] 12 cmH20       Objective   Blood pressure (!) 164/80, pulse 78, temperature 99.4 F (37.4 C), temperature source Axillary, resp. rate 20, height 5' 4 (1.626 m), weight 73.5 kg, last menstrual period 06/08/2024, SpO2 94%.    Vent Mode: PRVC FiO2 (%):  [80 %-100 %] 80 % Set Rate:  [18 bmp-22 bmp] 22 bmp Vt Set:  [400 mL] 400 mL PEEP:  [12 cmH20] 12 cmH20   Intake/Output Summary (Last 24 hours) at 06/28/2024 0759 Last data filed at 06/28/2024 0700 Gross per 24 hour  Intake 4168.99 ml  Output 2100 ml  Net 2068.99 ml   Filed Weights   06/27/24 1025 06/27/24 1127 06/27/24 1614  Weight: 73.5 kg 73.5 kg 73.5 kg    REVIEW OF SYSTEMS  PATIENT IS UNABLE TO PROVIDE COMPLETE REVIEW OF SYSTEMS DUE TO SEVERE CRITICAL ILLNESS   PHYSICAL EXAMINATION:  GENERAL:critically ill appearing, +resp distress EYES: Pupils equal, round, reactive to light.  No scleral icterus.  MOUTH: Moist mucosal membrane. INTUBATED NECK: Supple.  PULMONARY: Lungs clear to auscultation, +rhonchi, +wheezing CARDIOVASCULAR: S1 and S2.  Regular rate and rhythm GASTROINTESTINAL: Soft, nontender, -distended. Positive bowel sounds.  MUSCULOSKELETAL: No swelling, clubbing, or edema.  NEUROLOGIC: obtunded,sedated SKIN:normal, warm to touch, Capillary refill delayed  Pulses present  bilaterally   Labs/imaging that I havepersonally reviewed  (right click and Reselect all SmartList Selections daily)      ASSESSMENT AND PLAN SYNOPSIS  43 yo white female with severe ALI from acute severe bacterial pneumonia STREP Pneumonia with bacteremia leading to severe hypoxic resp failure metabolic encephalopathy, leading to emergent intubation   Severe ACUTE Hypoxic and Hypercapnic Respiratory Failure -continue Mechanical Ventilator support -continue  Bronchodilator Therapy -Wean Fio2 and PEEP as tolerated -VAP/VENT bundle implementation Severe HYPOXIA  Vent Mode: PRVC FiO2 (%):  [80 %-100 %] 80 % Set Rate:  [18 bmp-22 bmp] 22 bmp Vt Set:  [400 mL] 400 mL PEEP:  [12 cmH20] 12 cmH20   CARDIAC -oxygen as needed -follow up cardiac enzymes as indicated   CARDIAC ICU monitoring   ACUTE KIDNEY INJURY/Renal Failure -continue Foley Catheter-assess need -Avoid nephrotoxic agents -Follow urine output, BMP -Ensure adequate renal perfusion, optimize oxygenation -Renal dose medications   Intake/Output Summary (Last 24 hours) at 06/28/2024 0759 Last data filed at 06/28/2024 0700 Gross per 24 hour  Intake 4168.99 ml  Output 2100 ml  Net 2068.99 ml      Latest Ref Rng & Units 06/28/2024    3:38 AM 06/27/2024   10:42 AM 10/29/2023   11:05 AM  BMP  Glucose 70 - 99 mg/dL 840  890  80   BUN 6 - 20 mg/dL 50  67  16   Creatinine 0.44 - 1.00 mg/dL 8.75  7.94  9.37   Sodium 135 - 145 mmol/L 136  133  140   Potassium 3.5 - 5.1 mmol/L 3.5  3.1  4.0   Chloride 98 - 111 mmol/L 100  94  99   CO2 22 - 32 mmol/L 22  22  32   Calcium  8.9 - 10.3 mg/dL 8.3  8.9  9.9      NEUROLOGY Acute  metabolic encephalopathy, need for sedation Goal RASS -2 to -3   SEPTIC SHOCK SOURCE-pneumonia -use vasopressors to keep MAP>65 as needed -follow ABG and LA -follow up cultures -emperic ABX -consider stress dose steroids -aggressive IV fluid resuscitation  INFECTIOUS DISEASE -continue antibiotics as prescribed -follow up cultures   ENDO - ICU hypoglycemic\Hyperglycemia protocol -check FSBS per protocol   GI GI PROPHYLAXIS as indicated  NUTRITIONAL STATUS DIET-->TF's as tolerated Constipation protocol as indicated   ELECTROLYTES -follow labs as needed -replace as needed -pharmacy consultation and following     Best practice (right click and Reselect all SmartList Selections daily)  Diet:  NPO Pain/Anxiety/Delirium  protocol (if indicated): Yes (RASS goal -2) VAP protocol (if indicated): Yes DVT prophylaxis: Subcutaneous Heparin  GI prophylaxis: H2B Central venous access:  N/A Arterial line:  Yes, and it is still needed Foley:  Yes, and it is still needed Mobility:  bed rest  Code Status:  FULL CODE Disposition: ICU  Labs   CBC: Recent Labs  Lab 06/27/24 1042 06/28/24 0338  WBC 20.8* 35.4*  NEUTROABS 18.7*  --   HGB 11.3* 10.0*  HCT 33.5* 29.5*  MCV 89.6 88.3  PLT 399 438*    Basic Metabolic Panel: Recent Labs  Lab 06/27/24 1042 06/28/24 0338  NA 133* 136  K 3.1* 3.5  CL 94* 100  CO2 22 22  GLUCOSE 109* 159*  BUN 67* 50*  CREATININE 2.05* 1.24*  CALCIUM  8.9 8.3*  MG  --  1.9  PHOS  --  6.8*   GFR: Estimated Creatinine Clearance: 56.3 mL/min (A) (by C-G formula based on SCr of 1.24  mg/dL (H)). Recent Labs  Lab 06/27/24 1042 06/28/24 0338 06/28/24 0605  WBC 20.8* 35.4*  --   LATICACIDVEN 2.4*  --  1.3    Liver Function Tests: No results for input(s): AST, ALT, ALKPHOS, BILITOT, PROT, ALBUMIN in the last 168 hours. No results for input(s): LIPASE, AMYLASE in the last 168 hours. No results for input(s): AMMONIA in the last 168 hours.  ABG    Component Value Date/Time   PHART 7.33 (L) 06/28/2024 0544   PCO2ART 43 06/28/2024 0544   PO2ART 76 (L) 06/28/2024 0544   HCO3 22.7 06/28/2024 0544   ACIDBASEDEF 3.2 (H) 06/28/2024 0544   O2SAT 94.4 06/28/2024 0544     Coagulation Profile: No results for input(s): INR, PROTIME in the last 168 hours.  Cardiac Enzymes: No results for input(s): CKTOTAL, CKMB, CKMBINDEX, TROPONINI in the last 168 hours.  HbA1C: Hemoglobin A1C  Date/Time Value Ref Range Status  07/21/2012 04:48 AM 4.8 4.2 - 6.3 % Final    Comment:    The American Diabetes Association recommends that a primary goal of therapy should be <7% and that physicians should reevaluate the treatment regimen in patients with HbA1c  values consistently >8%.     CBG: Recent Labs  Lab 06/27/24 1617 06/27/24 2107 06/27/24 2338 06/28/24 0349  GLUCAP 83 152* 140* 142*    Allergies Allergies[1]     DVT/GI PRX  assessed I Assessed the need for Labs I Assessed the need for Foley I Assessed the need for Central Venous Line Family Discussion when available I Assessed the need for Mobilization I made an Assessment of medications to be adjusted accordingly Safety Risk assessment completed  CASE DISCUSSED IN MULTIDISCIPLINARY ROUNDS WITH ICU TEAM     Critical Care Time devoted to patient care services described in this note is 55 minutes.  Critical care was necessary to treat or prevent imminent or life-threatening deterioration.   Patient with Multiorgan failure and at high risk for cardiac arrest and death.    Nickolas Alm Cellar, M.D.  [facility]  Medical Director [facility]            [1]  Allergies Allergen Reactions   Varenicline  Tartrate Other (See Comments)    varenicline    Wellbutrin  [Bupropion ]     Suicidal thoughts

## 2024-06-28 NOTE — Plan of Care (Signed)

## 2024-06-28 NOTE — Progress Notes (Signed)
 PHARMACY CONSULT NOTE - FOLLOW UP  Pharmacy Consult for Electrolyte Monitoring and Replacement   Recent Labs: Potassium (mmol/L)  Date Value  06/28/2024 3.5  10/05/2013 3.5   Magnesium  (mg/dL)  Date Value  87/84/7974 1.9  01/29/2013 2.1   Calcium  (mg/dL)  Date Value  87/84/7974 8.3 (L)   Calcium , Total (mg/dL)  Date Value  96/75/7984 9.1   Albumin (g/dL)  Date Value  95/83/7974 4.8  10/05/2013 4.3   Phosphorus (mg/dL)  Date Value  87/84/7974 6.8 (H)  07/21/2012 2.1 (L)   Sodium (mmol/L)  Date Value  06/28/2024 136  10/05/2013 135 (L)    Assessment: 43 y.o. female with medical history significant of essential hypertension, seizure disorder, polysubstance abuse, recent use of fentanyl  and heroin, who presents to the hospital with progressively worsening short of breath, hypoxia.  Pharmacy is asked to follow and replace electrolytes while in CCU  MIVF: 0.9 % NaCl with KCl 20 mEq/ L   at 100 mL/hr  Goal of Therapy:  Electrolytes WNL  Plan:  ---no electrolyte replacement warranted for today ---recheck electrolytes in am  Adriana JONETTA Bolster ,PharmD Clinical Pharmacist 06/28/2024 11:51 AM

## 2024-06-28 NOTE — TOC Progression Note (Signed)
 Transition of Care [facility]) - Progression Note    Patient Details  Name: [name] Phillips MRN: [id number] Date of Birth: February 18, 1981  [facility] CM/SW Contact  Corrie JINNY Ruts, LCSW Phone Number: 06/28/2024, 12:47 PM  Clinical Narrative:     Chart reviewed. Patient is currently intubated. Per rounds the husband does not want anyone to know about SU. Please consult with husband if unable to speak with the patient directly.                     Expected Discharge Plan and Services                                               Social Drivers of Health (SDOH) Interventions SDOH Screenings   Food Insecurity: No Food Insecurity (06/27/2024)  Housing: Low Risk (06/27/2024)  Transportation Needs: No Transportation Needs (06/27/2024)  Utilities: Not At Risk (06/27/2024)  Depression (PHQ2-9): High Risk (10/29/2023)  Tobacco Use: Medium Risk (06/27/2024)    Readmission Risk Interventions     No data to display

## 2024-06-28 NOTE — Progress Notes (Signed)
 Initial Nutrition Assessment  DOCUMENTATION CODES:   Not applicable  INTERVENTION:   Vital 1.2@50ml /hr- Initiate at 20ml/hr and increase by 10ml/hr q 8 hours if goal rate is reached  ProSource TF 20- Give 60ml daily via tube, each supplement provides 80kcal and 20g of protein.   Free water  flushes 30ml q4 hours to maintain tube patency   Regimen provides 1520kcal/day, 110g/day protein and 1158ml/day of free water .   Pt at high refeed risk; recommend monitor potassium, magnesium  and phosphorus labs daily until stable  Thiamine  100mg  daily via tube   Folic acid  1mg  daily via tube   Daily weights   Check vitamin D  level   NUTRITION DIAGNOSIS:   Inadequate oral intake related to inability to eat (pt sedated and ventilated) as evidenced by NPO status.  GOAL:   Provide needs based on ASPEN/SCCM guidelines  MONITOR:   Vent status, Labs, Weight trends, Skin, I & O's, TF tolerance  REASON FOR ASSESSMENT:   Ventilator    ASSESSMENT:   43 y/o female with h/o anxiety, HTN, MDD, HCV, hepatic steatosis, HLD, GERD, seizures, etoh and substance abuse who is admitted with bacterial PNA, septic shock, bactermia and AKI.  Pt sedated and ventilated. OGT in place. Will plan to initiate tube feeds today. Pt is at high refeed risk. Husband at bedside reports pt with good appetite and oral intake at baseline. Per chart, pt appears to be down 12lbs(7%) since April; RD unsure how recently weight loss occurred. Pt with h/o vitamin D  deficiency; will check level given significant lung infection.   Medications reviewed and include: pepcid , heparin , solu-cortef , azithromycin , ceftriaxone , levophed , propofol    Labs reviewed: K 3.5 wnl, BUN 50(H), creat 1.24(H), P 6.8(H), Mg 1.9 wnl  Wbc- 35.4(H), Hgb 10.0(L), Hct 29.5(L) Cbgs- 142, 140, 152 x 48 hrs   Patient is currently intubated on ventilator support MV: 8.0 L/min Temp (24hrs), Avg:98.7 F (37.1 C), Min:98.2 F (36.8 C), Max:99.4 F  (37.4 C)  Propofol : 19.85 ml/hr- provides 524kcal/day   MAP >30mmHg   UOP-   NUTRITION - FOCUSED PHYSICAL EXAM:  Flowsheet Row Most Recent Value  Orbital Region No depletion  Upper Arm Region No depletion  Thoracic and Lumbar Region No depletion  Buccal Region No depletion  Temple Region No depletion  Clavicle Bone Region No depletion  Clavicle and Acromion Bone Region No depletion  Scapular Bone Region No depletion  Dorsal Hand No depletion  Patellar Region No depletion  Anterior Thigh Region No depletion  Posterior Calf Region No depletion  Edema (RD Assessment) None  Hair Reviewed  Eyes Reviewed  Mouth Reviewed  Skin Reviewed  Nails Reviewed   Diet Order:   Diet Order             Diet NPO time specified  Diet effective now                  EDUCATION NEEDS:   No education needs have been identified at this time  Skin:  Skin Assessment: Reviewed RN Assessment  Last BM:  pta  Height:   Ht Readings from Last 1 Encounters:  06/27/24 5' 4 (1.626 m)    Weight:   Wt Readings from Last 1 Encounters:  06/27/24 73.5 kg    Ideal Body Weight:  54.5 kg  BMI:  Body mass index is 27.81 kg/m.  Estimated Nutritional Needs:   Kcal:  1495kcal/day  Protein:  110-120g/day  Fluid:  1.7-1.9L/day  [name] MS, RD, LDN If unable  to be reached, please send secure chat to RD inpatient available from 8:00a-4:00p daily

## 2024-06-28 NOTE — Plan of Care (Addendum)
 Patient intubated and sedated. Levophed  titrated to keep patients MAP >65. Propofol  and Fentanyl  infusing titrated to maintain patients RASS/CPOT goals. NS with 20 KCL infusing per order.  Foley catheter draining clear yellow urine. OG tube connected to LWIS drained 250 ml of green bile this shift.  Patients sister was in to visit this shift and personal belongings (purse, small cloth bag, and cell phone) that were at bedside were placed in her  patient belongings bag located on the shelf in room. Oral care provided with repositioning.  Problem: Clinical Measurements: Goal: Ability to maintain clinical measurements within normal limits will improve Outcome: Progressing Goal: Cardiovascular complication will be avoided Outcome: Progressing   Problem: Elimination: Goal: Will not experience complications related to urinary retention Outcome: Progressing   Problem: Pain Managment: Goal: General experience of comfort will improve and/or be controlled Outcome: Progressing   Problem: Safety: Goal: Ability to remain free from injury will improve Outcome: Progressing   Problem: Clinical Measurements: Goal: Will remain free from infection Outcome: Not Progressing Goal: Diagnostic test results will improve Outcome: Not Progressing Goal: Respiratory complications will improve Outcome: Not Progressing   Problem: Nutrition: Goal: Adequate nutrition will be maintained Outcome: Not Progressing

## 2024-06-28 NOTE — Progress Notes (Signed)
 Patients husband at bedside, confirmed that he took patients personal belonging bag home today containing patients purse, personal bag, clothes from admission and cell phone.

## 2024-06-28 NOTE — Progress Notes (Signed)
 PHARMACY - PHYSICIAN COMMUNICATION CRITICAL VALUE ALERT - BLOOD CULTURE IDENTIFICATION (BCID)  Results for orders placed or performed during the hospital encounter of 06/27/24  Resp panel by RT-PCR (RSV, Flu A&B, Covid) Anterior Nasal Swab     Status: None   Collection Time: 06/27/24 10:42 AM   Specimen: Anterior Nasal Swab  Result Value Ref Range Status   SARS Coronavirus 2 by RT PCR NEGATIVE NEGATIVE Final    Comment: (NOTE) SARS-CoV-2 target nucleic acids are NOT DETECTED.  The SARS-CoV-2 RNA is generally detectable in upper respiratory specimens during the acute phase of infection. The lowest concentration of SARS-CoV-2 viral copies this assay can detect is 138 copies/mL. A negative result does not preclude SARS-Cov-2 infection and should not be used as the sole basis for treatment or other patient management decisions. A negative result may occur with  improper specimen collection/handling, submission of specimen other than nasopharyngeal swab, presence of viral mutation(s) within the areas targeted by this assay, and inadequate number of viral copies(<138 copies/mL). A negative result must be combined with clinical observations, patient history, and epidemiological information. The expected result is Negative.  Fact Sheet for Patients:  bloggercourse.com  Fact Sheet for Healthcare Providers:  seriousbroker.it  This test is no t yet approved or cleared by the [place]  FDA and  has been authorized for detection and/or diagnosis of SARS-CoV-2 by FDA under an Emergency Use Authorization (EUA). This EUA will remain  in effect (meaning this test can be used) for the duration of the COVID-19 declaration under Section 564(b)(1) of the Act, 21 U.S.C.section 360bbb-3(b)(1), unless the authorization is terminated  or revoked sooner.       Influenza A by PCR NEGATIVE NEGATIVE Final   Influenza B by PCR NEGATIVE NEGATIVE Final     Comment: (NOTE) The Xpert Xpress SARS-CoV-2/FLU/RSV plus assay is intended as an aid in the diagnosis of influenza from Nasopharyngeal swab specimens and should not be used as a sole basis for treatment. Nasal washings and aspirates are unacceptable for Xpert Xpress SARS-CoV-2/FLU/RSV testing.  Fact Sheet for Patients: bloggercourse.com  Fact Sheet for Healthcare Providers: seriousbroker.it  This test is not yet approved or cleared by the [place]  FDA and has been authorized for detection and/or diagnosis of SARS-CoV-2 by FDA under an Emergency Use Authorization (EUA). This EUA will remain in effect (meaning this test can be used) for the duration of the COVID-19 declaration under Section 564(b)(1) of the Act, 21 U.S.C. section 360bbb-3(b)(1), unless the authorization is terminated or revoked.     Resp Syncytial Virus by PCR NEGATIVE NEGATIVE Final    Comment: (NOTE) Fact Sheet for Patients: bloggercourse.com  Fact Sheet for Healthcare Providers: seriousbroker.it  This test is not yet approved or cleared by the [place]  FDA and has been authorized for detection and/or diagnosis of SARS-CoV-2 by FDA under an Emergency Use Authorization (EUA). This EUA will remain in effect (meaning this test can be used) for the duration of the COVID-19 declaration under Section 564(b)(1) of the Act, 21 U.S.C. section 360bbb-3(b)(1), unless the authorization is terminated or revoked.  Performed at [facility], [address] [place], [place] [address]   Blood culture (routine x 2)     Status: None (Preliminary result)   Collection Time: 06/27/24 10:42 AM   Specimen: BLOOD LEFT ARM  Result Value Ref Range Status   Specimen Description BLOOD LEFT ARM  Final   Special Requests   Final    BOTTLES DRAWN AEROBIC AND ANAEROBIC  Blood Culture adequate volume   Culture   Setup Time   Final    Organism ID to follow AEROBIC BOTTLE ONLY GRAM POSITIVE COCCI CRITICAL RESULT CALLED TO, READ BACK BY AND VERIFIED WITH: Bryahna Lesko 06/28/24 0026 KLW Performed at [facility], [address] [place], [place] [address]    Culture GRAM POSITIVE COCCI  Final   Report Status PENDING  Incomplete  Blood Culture ID Panel (Reflexed)     Status: Abnormal   Collection Time: 06/27/24 10:42 AM  Result Value Ref Range Status   Enterococcus faecalis NOT DETECTED NOT DETECTED Final   Enterococcus Faecium NOT DETECTED NOT DETECTED Final   Listeria monocytogenes NOT DETECTED NOT DETECTED Final   Staphylococcus species NOT DETECTED NOT DETECTED Final   Staphylococcus aureus (BCID) NOT DETECTED NOT DETECTED Final   Staphylococcus epidermidis NOT DETECTED NOT DETECTED Final   Staphylococcus lugdunensis NOT DETECTED NOT DETECTED Final   Streptococcus species DETECTED (A) NOT DETECTED Final    Comment: CRITICAL RESULT CALLED TO, READ BACK BY AND VERIFIED WITH: Arraya Buck 06/28/24 0026 KLW    Streptococcus agalactiae NOT DETECTED NOT DETECTED Final   Streptococcus pneumoniae DETECTED (A) NOT DETECTED Final    Comment: CRITICAL RESULT CALLED TO, READ BACK BY AND VERIFIED WITH: Laren Orama 06/28/24 0026 KLW    Streptococcus pyogenes NOT DETECTED NOT DETECTED Final   A.calcoaceticus-baumannii NOT DETECTED NOT DETECTED Final   Bacteroides fragilis NOT DETECTED NOT DETECTED Final   Enterobacterales NOT DETECTED NOT DETECTED Final   Enterobacter cloacae complex NOT DETECTED NOT DETECTED Final   Escherichia coli NOT DETECTED NOT DETECTED Final   Klebsiella aerogenes NOT DETECTED NOT DETECTED Final   Klebsiella oxytoca NOT DETECTED NOT DETECTED Final   Klebsiella pneumoniae NOT DETECTED NOT DETECTED Final   Proteus species NOT DETECTED NOT DETECTED Final   Salmonella species NOT DETECTED NOT DETECTED Final   Serratia marcescens NOT DETECTED NOT DETECTED Final    Haemophilus influenzae NOT DETECTED NOT DETECTED Final   Neisseria meningitidis NOT DETECTED NOT DETECTED Final   Pseudomonas aeruginosa NOT DETECTED NOT DETECTED Final   Stenotrophomonas maltophilia NOT DETECTED NOT DETECTED Final   Candida albicans NOT DETECTED NOT DETECTED Final   Candida auris NOT DETECTED NOT DETECTED Final   Candida glabrata NOT DETECTED NOT DETECTED Final   Candida krusei NOT DETECTED NOT DETECTED Final   Candida parapsilosis NOT DETECTED NOT DETECTED Final   Candida tropicalis NOT DETECTED NOT DETECTED Final   Cryptococcus neoformans/gattii NOT DETECTED NOT DETECTED Final    Comment: Performed at [facility], [address] [place], [place] [address]  MRSA Next Gen by PCR, Nasal     Status: None   Collection Time: 06/27/24  2:05 PM   Specimen: Nasal Mucosa; Nasal Swab  Result Value Ref Range Status   MRSA by PCR Next Gen NOT DETECTED NOT DETECTED Final    Comment: (NOTE) The GeneXpert MRSA Assay (FDA approved for NASAL specimens only), is one component of a comprehensive MRSA colonization surveillance program. It is not intended to diagnose MRSA infection nor to guide or monitor treatment for MRSA infections. Test performance is not FDA approved in patients less than 73 years old. Performed at [facility], [address] [place], [place] [address]   Expectorated Sputum Assessment w Gram Stain, Rflx to Resp Cult     Status: None   Collection Time: 06/27/24  3:55 PM   Specimen: Sputum  Result Value Ref Range Status  Specimen Description SPUTUM  Final   Special Requests NONE  Final   Sputum evaluation   Final    THIS SPECIMEN IS ACCEPTABLE FOR SPUTUM CULTURE Performed at [facility], [address] [place], [place] [address]    Report Status 06/27/2024 FINAL  Final    BCID Results: 1 (aerobic) of 3 bottles with Streptococcus pneumoniae.  Pt currently on Azithromycin , Linezolid , and Zosyn  for CAP / Aspiration  pneumonia.  Name of provider contacted: [name] Nose, NP   Changes to prescribed antibiotics required: No changes at this time d/t severity of pt condition.  Rankin [name] Phillips, PharmD, MBA 06/28/2024 1:34 AM

## 2024-06-29 ENCOUNTER — Inpatient Hospital Stay

## 2024-06-29 DIAGNOSIS — J9602 Acute respiratory failure with hypercapnia: Secondary | ICD-10-CM | POA: Diagnosis not present

## 2024-06-29 DIAGNOSIS — J9601 Acute respiratory failure with hypoxia: Secondary | ICD-10-CM | POA: Diagnosis not present

## 2024-06-29 DIAGNOSIS — G9341 Metabolic encephalopathy: Secondary | ICD-10-CM | POA: Diagnosis not present

## 2024-06-29 DIAGNOSIS — J154 Pneumonia due to other streptococci: Secondary | ICD-10-CM | POA: Diagnosis not present

## 2024-06-29 LAB — BLOOD GAS, ARTERIAL
Acid-Base Excess: 0.7 mmol/L (ref 0.0–2.0)
Acid-Base Excess: 0.1 mmol/L (ref 0.0–2.0)
Acid-Base Excess: 4.5 mmol/L — ABNORMAL HIGH (ref 0.0–2.0)
Bicarbonate: 26.6 mmol/L (ref 20.0–28.0)
Bicarbonate: 28.7 mmol/L — ABNORMAL HIGH (ref 20.0–28.0)
Bicarbonate: 32.2 mmol/L — ABNORMAL HIGH (ref 20.0–28.0)
FIO2: 75 %
FIO2: 75 %
FIO2: 75 %
MECHVT: 400 mL
O2 Saturation: 99.7 %
O2 Saturation: 98.3 %
O2 Saturation: 99.2 %
PEEP: 12 cmH2O
PEEP: 12 cmH2O
PEEP: 12 cmH2O
Patient temperature: 37
Patient temperature: 37
Patient temperature: 37
Pressure control: 18 cmH2O
Pressure control: 18 cmH2O
RATE: 22 {breaths}/min
RATE: 22 {breaths}/min
RATE: 26 {breaths}/min
pCO2 arterial: 47 mmHg (ref 32–48)
pCO2 arterial: 64 mmHg — ABNORMAL HIGH (ref 32–48)
pCO2 arterial: 61 mmHg — ABNORMAL HIGH (ref 32–48)
pH, Arterial: 7.36 (ref 7.35–7.45)
pH, Arterial: 7.26 — ABNORMAL LOW (ref 7.35–7.45)
pH, Arterial: 7.33 — ABNORMAL LOW (ref 7.35–7.45)
pO2, Arterial: 98 mmHg (ref 83–108)
pO2, Arterial: 80 mmHg — ABNORMAL LOW (ref 83–108)
pO2, Arterial: 94 mmHg (ref 83–108)

## 2024-06-29 LAB — GLUCOSE, CAPILLARY
Glucose-Capillary: 150 mg/dL — ABNORMAL HIGH (ref 70–99)
Glucose-Capillary: 197 mg/dL — ABNORMAL HIGH (ref 70–99)
Glucose-Capillary: 190 mg/dL — ABNORMAL HIGH (ref 70–99)
Glucose-Capillary: 200 mg/dL — ABNORMAL HIGH (ref 70–99)

## 2024-06-29 LAB — CBC
HCT: 25 % — ABNORMAL LOW (ref 36.0–46.0)
Hemoglobin: 8.7 g/dL — ABNORMAL LOW (ref 12.0–15.0)
MCH: 30.3 pg (ref 26.0–34.0)
MCHC: 34.8 g/dL (ref 30.0–36.0)
MCV: 87.1 fL (ref 80.0–100.0)
Platelets: 367 K/uL (ref 150–400)
RBC: 2.87 MIL/uL — ABNORMAL LOW (ref 3.87–5.11)
RDW: 14.5 % (ref 11.5–15.5)
WBC: 32.6 K/uL — ABNORMAL HIGH (ref 4.0–10.5)
nRBC: 0 % (ref 0.0–0.2)

## 2024-06-29 LAB — RENAL FUNCTION PANEL
Albumin: 2.2 g/dL — ABNORMAL LOW (ref 3.5–5.0)
Anion gap: 11 (ref 5–15)
BUN: 36 mg/dL — ABNORMAL HIGH (ref 6–20)
CO2: 26 mmol/L (ref 22–32)
Calcium: 8.6 mg/dL — ABNORMAL LOW (ref 8.9–10.3)
Chloride: 105 mmol/L (ref 98–111)
Creatinine, Ser: 0.81 mg/dL (ref 0.44–1.00)
GFR, Estimated: >60 mL/min (ref >60)
Glucose, Bld: 168 mg/dL — ABNORMAL HIGH (ref 70–99)
Phosphorus: 4.1 mg/dL (ref 2.5–4.6)
Potassium: 2.9 mmol/L — ABNORMAL LOW (ref 3.5–5.1)
Sodium: 142 mmol/L (ref 135–145)

## 2024-06-29 LAB — POTASSIUM: Potassium: 4.2 mmol/L (ref 3.5–5.1)

## 2024-06-29 LAB — MAGNESIUM: Magnesium: 2.3 mg/dL (ref 1.7–2.4)

## 2024-06-29 LAB — VITAMIN D 25 HYDROXY (VIT D DEFICIENCY, FRACTURES): Vit D, 25-Hydroxy: 27.3 ng/mL — ABNORMAL LOW (ref 30–100)

## 2024-06-29 MED ORDER — METHYLPREDNISOLONE SODIUM SUCC 40 MG IJ SOLR
40.0000 mg | Freq: Two times a day (BID) | INTRAMUSCULAR | Status: DC
  Administered 2024-06-29 – 2024-07-04 (×10): 40 mg via INTRAVENOUS
  Filled 2024-06-29 (×11): qty 1

## 2024-06-29 MED ORDER — MIDAZOLAM BOLUS VIA INFUSION
0.0000 mg | INTRAVENOUS | Status: DC | PRN
  Administered 2024-06-30 – 2024-07-01 (×5): 5 mg via INTRAVENOUS
  Administered 2024-07-02: 4 mg via INTRAVENOUS
  Administered 2024-07-02 – 2024-07-03 (×3): 5 mg via INTRAVENOUS
  Administered 2024-07-05: 4 mg via INTRAVENOUS

## 2024-06-29 MED ORDER — POTASSIUM CHLORIDE 10 MEQ/100ML IV SOLN
10.0000 meq | INTRAVENOUS | Status: AC
  Administered 2024-06-29 (×4): 10 meq via INTRAVENOUS
  Filled 2024-06-29 (×4): qty 100

## 2024-06-29 MED ORDER — VECURONIUM BROMIDE 10 MG IV SOLR
20.0000 mg | INTRAVENOUS | Status: DC | PRN
  Administered 2024-06-29 – 2024-07-01 (×13): 20 mg via INTRAVENOUS
  Filled 2024-06-29 (×13): qty 20

## 2024-06-29 MED ORDER — INSULIN ASPART 100 UNIT/ML IJ SOLN
0.0000 [IU] | INTRAMUSCULAR | Status: DC
  Administered 2024-06-29 – 2024-06-30 (×3): 2 [IU] via SUBCUTANEOUS
  Administered 2024-06-30: 04:00:00 3 [IU] via SUBCUTANEOUS
  Filled 2024-06-29 (×3): qty 2
  Filled 2024-06-29: qty 3

## 2024-06-29 MED ORDER — DOCUSATE SODIUM 50 MG/5ML PO LIQD
100.0000 mg | Freq: Two times a day (BID) | ORAL | Status: DC
  Administered 2024-06-29 – 2024-07-07 (×5): 100 mg
  Filled 2024-06-29 (×8): qty 10

## 2024-06-29 MED ORDER — POTASSIUM CHLORIDE 20 MEQ PO PACK
40.0000 meq | PACK | Freq: Once | ORAL | Status: AC
  Administered 2024-06-29: 20:00:00 40 meq
  Filled 2024-06-29: qty 2

## 2024-06-29 MED ORDER — HYDRALAZINE HCL 20 MG/ML IJ SOLN
10.0000 mg | INTRAMUSCULAR | Status: DC | PRN
  Administered 2024-06-29: 18:00:00 10 mg via INTRAVENOUS
  Filled 2024-06-29: qty 1

## 2024-06-29 MED ORDER — HYDRALAZINE HCL 20 MG/ML IJ SOLN
10.0000 mg | INTRAMUSCULAR | Status: DC | PRN
  Administered 2024-06-30 – 2024-07-03 (×4): 20 mg via INTRAVENOUS
  Filled 2024-06-29 (×5): qty 1

## 2024-06-29 MED ORDER — LABETALOL HCL 5 MG/ML IV SOLN
10.0000 mg | INTRAVENOUS | Status: DC | PRN
  Administered 2024-06-29 – 2024-07-03 (×6): 20 mg via INTRAVENOUS
  Filled 2024-06-29 (×6): qty 4

## 2024-06-29 MED ORDER — POLYETHYLENE GLYCOL 3350 17 G PO PACK
17.0000 g | PACK | Freq: Every day | ORAL | Status: DC
  Administered 2024-06-29 – 2024-07-07 (×3): 17 g
  Filled 2024-06-29 (×3): qty 1

## 2024-06-29 MED ORDER — CHOLECALCIFEROL 10 MCG/ML (400 UNIT/ML) PO LIQD
2000.0000 [IU] | Freq: Every day | ORAL | Status: DC
  Administered 2024-06-30 – 2024-07-08 (×9): 2000 [IU]
  Filled 2024-06-29 (×10): qty 5

## 2024-06-29 MED ORDER — MIDAZOLAM-SODIUM CHLORIDE 100-0.9 MG/100ML-% IV SOLN
0.0000 mg/h | INTRAVENOUS | Status: DC
  Administered 2024-06-29: 09:00:00 2 mg/h via INTRAVENOUS
  Administered 2024-06-29 – 2024-07-04 (×13): 10 mg/h via INTRAVENOUS
  Filled 2024-06-29 (×14): qty 100

## 2024-06-29 NOTE — Progress Notes (Signed)
 Endotracheal tube advanced 1cm per MD order.  Tube is now positioned at 26cm at the lip.

## 2024-06-29 NOTE — Progress Notes (Signed)
 PHARMACY CONSULT NOTE - FOLLOW UP  Pharmacy Consult for Electrolyte Monitoring and Replacement   Recent Labs: Potassium (mmol/L)  Date Value  06/29/2024 2.9 (L)  10/05/2013 3.5   Magnesium  (mg/dL)  Date Value  87/83/7974 2.3  01/29/2013 2.1   Calcium  (mg/dL)  Date Value  87/83/7974 8.6 (L)   Calcium , Total (mg/dL)  Date Value  96/75/7984 9.1   Albumin (g/dL)  Date Value  87/83/7974 2.2 (L)  10/05/2013 4.3   Phosphorus (mg/dL)  Date Value  87/83/7974 4.1  07/21/2012 2.1 (L)   Sodium (mmol/L)  Date Value  06/29/2024 142  10/05/2013 135 (L)    Assessment: 43 y.o. female with medical history significant of essential hypertension, seizure disorder, polysubstance abuse, recent use of fentanyl  and heroin, who presents to the hospital with progressively worsening short of breath, hypoxia.  Pharmacy is asked to follow and replace electrolytes while in CCU  Nutrition: Vital AF at 40 mL/hr + FWF 30 mL every 4 hours  Goal of Therapy:  Electrolytes WNL  Plan:  ---10 mEq IV KCl x 4 ---recheck electrolytes in am  Adriana JONETTA Bolster ,PharmD Clinical Pharmacist 06/29/2024 7:11 AM

## 2024-06-29 NOTE — Plan of Care (Signed)
°  Problem: Clinical Measurements: Goal: Cardiovascular complication will be avoided Outcome: Progressing   Problem: Nutrition: Goal: Adequate nutrition will be maintained Outcome: Progressing   Problem: Elimination: Goal: Will not experience complications related to urinary retention Outcome: Progressing   Problem: Pain Managment: Goal: General experience of comfort will improve and/or be controlled Outcome: Progressing   Problem: Safety: Goal: Ability to remain free from injury will improve Outcome: Progressing   Problem: Clinical Measurements: Goal: Ability to maintain clinical measurements within normal limits will improve Outcome: Not Progressing Goal: Will remain free from infection Outcome: Not Progressing Goal: Diagnostic test results will improve Outcome: Not Progressing Goal: Respiratory complications will improve Outcome: Not Progressing   Problem: Coping: Goal: Level of anxiety will decrease Outcome: Not Progressing

## 2024-06-29 NOTE — Progress Notes (Signed)
 NAME:  [name] Phillips, MRN:  [id number], DOB:  28-Feb-1981, LOS: 2 ADMISSION DATE:  06/27/2024  CHIEF COMPLAINT:  severe resp failure, COCAINE ABUSE AND STREP PNEUMONIA   History of Present Illness:   43 year old female with history of polysubstance use (IVDU with fentanyl , last injection 4 days ago) who presents with increased shortness of breath and admitted for management of pneumonia. +COCAINE  Patient with worsening respiratory status with increased work of breathing, worsening hypoxia, and increased tachycardia. With her very severe pneumonia we would need to escalate therapy further with intubation, mechanical ventilation, and consideration for bronchoscopy to clear airway secretions. Discussed with the patient and her husband at bedside and she consents to proceed.     Significant Hospital Events: Including procedures, antibiotic start and stop dates in addition to other pertinent events   06/27/24: admit with right sided pneumonia and respiratory failure. CXR with right lung white out, INTUBATED, S/p BRONCH, ART LINE PLACED 12/14 BLOOD CX +STREP PNEUMONIA 12/15 remains on vent 12/16 remains on vent,       Micro Data:  12/14 BLOOD CX + STREP PNEUMONIA  Antimicrobials:   Antibiotics Given (last 72 hours)     Date/Time Action Medication Dose Rate   06/27/24 1130 New Bag/Given   cefTRIAXone  (ROCEPHIN ) 1 g in sodium chloride  0.9 % 100 mL IVPB 1 g 200 mL/hr   06/27/24 1449 New Bag/Given   azithromycin  (ZITHROMAX ) 500 mg in sodium chloride  0.9 % 250 mL IVPB 500 mg 250 mL/hr   06/27/24 1744 New Bag/Given   piperacillin -tazobactam (ZOSYN ) IVPB 3.375 g 3.375 g 12.5 mL/hr   06/27/24 1808 New Bag/Given   linezolid  (ZYVOX ) IVPB 600 mg 600 mg 300 mL/hr   06/28/24 0221 New Bag/Given   piperacillin -tazobactam (ZOSYN ) IVPB 3.375 g 3.375 g 12.5 mL/hr   06/28/24 9373 New Bag/Given   linezolid  (ZYVOX ) IVPB 600 mg 600 mg 300 mL/hr   06/28/24 9040 New Bag/Given    piperacillin -tazobactam (ZOSYN ) IVPB 3.375 g 3.375 g 12.5 mL/hr   06/28/24 1621 New Bag/Given   cefTRIAXone  (ROCEPHIN ) 2 g in sodium chloride  0.9 % 100 mL IVPB 2 g 200 mL/hr            Interim History / Subjective:  Remains critically ill Remains intubated Severe hypoxia Requires VENT support for survival  Vent Mode: PRVC FiO2 (%):  [65 %-80 %] 65 % Set Rate:  [22 bmp] 22 bmp Vt Set:  [400 mL] 400 mL PEEP:  [10 cmH20-12 cmH20] 12 cmH20       Objective   Blood pressure (!) 164/80, pulse 74, temperature 98.6 F (37 C), temperature source Axillary, resp. rate (!) 21, height 5' 4 (1.626 m), weight 73.5 kg, last menstrual period 06/08/2024, SpO2 96%.    Vent Mode: PRVC FiO2 (%):  [65 %-80 %] 65 % Set Rate:  [22 bmp] 22 bmp Vt Set:  [400 mL] 400 mL PEEP:  [10 cmH20-12 cmH20] 12 cmH20   Intake/Output Summary (Last 24 hours) at 06/29/2024 0748 Last data filed at 06/29/2024 0730 Gross per 24 hour  Intake 2581.76 ml  Output 2625 ml  Net -43.24 ml   Filed Weights   06/27/24 1025 06/27/24 1127 06/27/24 1614  Weight: 73.5 kg 73.5 kg 73.5 kg     REVIEW OF SYSTEMS  PATIENT IS UNABLE TO PROVIDE COMPLETE REVIEW OF SYSTEMS DUE TO SEVERE CRITICAL ILLNESS   PHYSICAL EXAMINATION:  GENERAL:critically ill appearing, +resp distress EYES: Pupils equal, round, reactive to light.  No  scleral icterus.  MOUTH: Moist mucosal membrane. INTUBATED NECK: Supple.  PULMONARY: Lungs clear to auscultation, +rhonchi, +wheezing CARDIOVASCULAR: S1 and S2.  Regular rate and rhythm GASTROINTESTINAL: Soft, nontender, -distended. Positive bowel sounds.  MUSCULOSKELETAL: No swelling, clubbing, or edema.  NEUROLOGIC: obtunded,sedated SKIN:normal, warm to touch, Capillary refill delayed  Pulses present bilaterally  Labs/imaging that I havepersonally reviewed  (right click and Reselect all SmartList Selections daily)      ASSESSMENT AND PLAN SYNOPSIS  43 yo white female with severe  ALI from acute severe bacterial pneumonia STREP Pneumonia with bacteremia leading to severe hypoxic resp failure metabolic encephalopathy, leading to emergent intubation  Severe ACUTE Hypoxic Respiratory Failure -continue Mechanical Ventilator support -Wean Fio2 and PEEP as tolerated -VAP/VENT bundle implementation - Wean PEEP & FiO2 as tolerated, maintain SpO2 > 88% - Head of bed elevated 30 degrees, VAP protocol in place - Plateau pressures less than 30 cm H20  - Intermittent chest x-ray & ABG PRN - Ensure adequate pulmonary hygiene  Severe HYPOXIA-unable to wean today  Vent Mode: PRVC FiO2 (%):  [65 %-80 %] 65 % Set Rate:  [22 bmp] 22 bmp Vt Set:  [400 mL] 400 mL PEEP:  [10 cmH20-12 cmH20] 12 cmH20   CARDIAC -oxygen as needed -follow up cardiac enzymes as indicated   CARDIAC ICU monitoring  RENAL -continue Foley Catheter-assess need -Avoid nephrotoxic agents -Follow urine output, BMP -Ensure adequate renal perfusion, optimize oxygenation -Renal dose medications   Intake/Output Summary (Last 24 hours) at 06/29/2024 0749 Last data filed at 06/29/2024 0730 Gross per 24 hour  Intake 2708.42 ml  Output 2625 ml  Net 83.42 ml     NEUROLOGY ACUTE METABOLIC ENCEPHALOPATHY -need for sedation -Goal RASS -2 to -3  SEPTIC shock SOURCE-strep pneumonia -use vasopressors to keep MAP>65 as needed -follow ABG and LA as needed -follow up cultures -emperic ABX   INFECTIOUS DISEASE -continue antibiotics as prescribed -follow up cultures   ENDO - ICU hypoglycemic\Hyperglycemia protocol -check FSBS per protocol   GI GI PROPHYLAXIS as indicated NUTRITIONAL STATUS DIET-->TF's as tolerated Constipation protocol as indicated   ELECTROLYTES -follow labs as needed -replace as needed -pharmacy consultation and following  RESTRICTIVE TRANSFUSION PROTOCOL TRANSFUSION  IF HGB<7  or ACTIVE BLEEDING OR DX of ACUTE CORONARY SYNDROMES       Best practice (right  click and Reselect all SmartList Selections daily)  Diet:  NPO Pain/Anxiety/Delirium protocol (if indicated): Yes (RASS goal -2) VAP protocol (if indicated): Yes DVT prophylaxis: Subcutaneous Heparin  GI prophylaxis: H2B Central venous access:  N/A Arterial line:  Yes, and it is still needed Foley:  Yes, and it is still needed Mobility:  bed rest  Code Status:  FULL CODE Disposition: ICU  Labs   CBC: Recent Labs  Lab 06/27/24 1042 06/28/24 0338 06/29/24 0505  WBC 20.8* 35.4* 32.6*  NEUTROABS 18.7*  --   --   HGB 11.3* 10.0* 8.7*  HCT 33.5* 29.5* 25.0*  MCV 89.6 88.3 87.1  PLT 399 438* 367    Basic Metabolic Panel: Recent Labs  Lab 06/27/24 1042 06/28/24 0338 06/29/24 0505  NA 133* 136 142  K 3.1* 3.5 2.9*  CL 94* 100 105  CO2 22 22 26   GLUCOSE 109* 159* 168*  BUN 67* 50* 36*  CREATININE 2.05* 1.24* 0.81  CALCIUM  8.9 8.3* 8.6*  MG  --  1.9 2.3  PHOS  --  6.8* 4.1   GFR: Estimated Creatinine Clearance: 86.1 mL/min (by C-G formula based on SCr of 0.81  mg/dL). Recent Labs  Lab 06/27/24 1042 06/28/24 0338 06/28/24 0605 06/29/24 0505  WBC 20.8* 35.4*  --  32.6*  LATICACIDVEN 2.4*  --  1.3  --     Liver Function Tests: Recent Labs  Lab 06/29/24 0505  ALBUMIN 2.2*   No results for input(s): LIPASE, AMYLASE in the last 168 hours. No results for input(s): AMMONIA in the last 168 hours.  ABG    Component Value Date/Time   PHART 7.36 06/29/2024 0529   PCO2ART 47 06/29/2024 0529   PO2ART 98 06/29/2024 0529   HCO3 26.6 06/29/2024 0529   ACIDBASEDEF 3.2 (H) 06/28/2024 0544   O2SAT 99.7 06/29/2024 0529     Coagulation Profile: No results for input(s): INR, PROTIME in the last 168 hours.  Cardiac Enzymes: No results for input(s): CKTOTAL, CKMB, CKMBINDEX, TROPONINI in the last 168 hours.  HbA1C: Hemoglobin A1C  Date/Time Value Ref Range Status  07/21/2012 04:48 AM 4.8 4.2 - 6.3 % Final    Comment:    The American Diabetes  Association recommends that a primary goal of therapy should be <7% and that physicians should reevaluate the treatment regimen in patients with HbA1c values consistently >8%.     CBG: Recent Labs  Lab 06/27/24 1617 06/27/24 2107 06/27/24 2338 06/28/24 0349  GLUCAP 83 152* 140* 142*    Allergies Allergies[1]    DVT/GI PRX  assessed I Assessed the need for Labs I Assessed the need for Foley I Assessed the need for Central Venous Line Family Discussion when available I Assessed the need for Mobilization I made an Assessment of medications to be adjusted accordingly Safety Risk assessment completed  CASE DISCUSSED IN MULTIDISCIPLINARY ROUNDS WITH ICU TEAM     Critical Care Time devoted to patient care services described in this note is 55 minutes.  Critical care was necessary to treat /prevent imminent and life-threatening deterioration. Overall, patient is critically ill, prognosis is guarded.     Nickolas Alm Cellar, M.D.  [facility]  Medical Director [facility]            [1]  Allergies Allergen Reactions   Varenicline  Tartrate Other (See Comments)    varenicline    Wellbutrin  [Bupropion ]     Suicidal thoughts

## 2024-06-29 NOTE — Plan of Care (Signed)

## 2024-06-29 NOTE — IPAL (Signed)
°  Interdisciplinary Goals of Care Family Meeting   Date carried out: 06/29/2024  Location of the meeting: Bedside  Member's involved: Physician, Bedside Registered Nurse, and Family Member or next of kin    GOALS OF CARE DISCUSSION  The Clinical status was relayed to family in detail- Husband, MIL, Mother  Updated and notified of patients medical condition- Patient remains unresponsive and will not open eyes to command.   Patient with increased WOB and using accessory muscles to breathe Explained to family course of therapy and the modalities  Severe Pneumonia in setting of Cocaine and drug abuse   PATIENT REMAINS FULL CODE  Family understands the situation.  Family are satisfied with Plan of action and management. All questions answered  Additional CC time 45 mins   Jeramiah Mccaughey Alm Cellar, M.D.  [facility]  Medical Director [facility] Medical Director [facility] Cardio-Pulmonary Department

## 2024-06-30 ENCOUNTER — Inpatient Hospital Stay

## 2024-06-30 DIAGNOSIS — R739 Hyperglycemia, unspecified: Secondary | ICD-10-CM | POA: Diagnosis not present

## 2024-06-30 DIAGNOSIS — J9601 Acute respiratory failure with hypoxia: Secondary | ICD-10-CM | POA: Diagnosis not present

## 2024-06-30 DIAGNOSIS — G40909 Epilepsy, unspecified, not intractable, without status epilepticus: Secondary | ICD-10-CM | POA: Diagnosis not present

## 2024-06-30 DIAGNOSIS — N179 Acute kidney failure, unspecified: Secondary | ICD-10-CM | POA: Diagnosis not present

## 2024-06-30 DIAGNOSIS — J9602 Acute respiratory failure with hypercapnia: Secondary | ICD-10-CM | POA: Diagnosis not present

## 2024-06-30 DIAGNOSIS — G9341 Metabolic encephalopathy: Secondary | ICD-10-CM | POA: Diagnosis not present

## 2024-06-30 DIAGNOSIS — Z9911 Dependence on respirator [ventilator] status: Secondary | ICD-10-CM | POA: Diagnosis not present

## 2024-06-30 DIAGNOSIS — F1190 Opioid use, unspecified, uncomplicated: Secondary | ICD-10-CM | POA: Diagnosis not present

## 2024-06-30 DIAGNOSIS — A419 Sepsis, unspecified organism: Secondary | ICD-10-CM | POA: Diagnosis not present

## 2024-06-30 DIAGNOSIS — R6521 Severe sepsis with septic shock: Secondary | ICD-10-CM | POA: Diagnosis not present

## 2024-06-30 DIAGNOSIS — J154 Pneumonia due to other streptococci: Secondary | ICD-10-CM | POA: Diagnosis not present

## 2024-06-30 LAB — CULTURE, RESPIRATORY W GRAM STAIN: Culture: NORMAL

## 2024-06-30 LAB — BLOOD GAS, ARTERIAL
Acid-Base Excess: 6.2 mmol/L — ABNORMAL HIGH (ref 0.0–2.0)
Acid-Base Excess: 6 mmol/L — ABNORMAL HIGH (ref 0.0–2.0)
Bicarbonate: 32.7 mmol/L — ABNORMAL HIGH (ref 20.0–28.0)
Bicarbonate: 40.4 mmol/L — ABNORMAL HIGH (ref 20.0–28.0)
FIO2: 75 %
MECHVT: 400 mL
O2 Saturation: 99 %
O2 Saturation: 97 %
PEEP: 12 cmH2O
Patient temperature: 37
Patient temperature: 37
RATE: 26 {breaths}/min
pCO2 arterial: 54 mmHg — ABNORMAL HIGH (ref 32–48)
pCO2 arterial: >123 mmHg (ref 32–48)
pH, Arterial: 7.39 (ref 7.35–7.45)
pH, Arterial: 7.1 — CL (ref 7.35–7.45)
pO2, Arterial: 89 mmHg (ref 83–108)
pO2, Arterial: 90 mmHg (ref 83–108)

## 2024-06-30 LAB — GLUCOSE, CAPILLARY
Glucose-Capillary: 228 mg/dL — ABNORMAL HIGH (ref 70–99)
Glucose-Capillary: 193 mg/dL — ABNORMAL HIGH (ref 70–99)
Glucose-Capillary: 274 mg/dL — ABNORMAL HIGH (ref 70–99)
Glucose-Capillary: 219 mg/dL — ABNORMAL HIGH (ref 70–99)
Glucose-Capillary: 254 mg/dL — ABNORMAL HIGH (ref 70–99)
Glucose-Capillary: 170 mg/dL — ABNORMAL HIGH (ref 70–99)

## 2024-06-30 LAB — PNEUMOCYSTIS SMEAR, DFA: Pneumocystis Smear, DFA: NEGATIVE

## 2024-06-30 LAB — CULTURE, BLOOD (ROUTINE X 2): Special Requests: ADEQUATE

## 2024-06-30 LAB — CBC
HCT: 28.4 % — ABNORMAL LOW (ref 36.0–46.0)
Hemoglobin: 9.2 g/dL — ABNORMAL LOW (ref 12.0–15.0)
MCH: 29.7 pg (ref 26.0–34.0)
MCHC: 32.4 g/dL (ref 30.0–36.0)
MCV: 91.6 fL (ref 80.0–100.0)
Platelets: 305 K/uL (ref 150–400)
RBC: 3.1 MIL/uL — ABNORMAL LOW (ref 3.87–5.11)
RDW: 14.9 % (ref 11.5–15.5)
WBC: 28.3 K/uL — ABNORMAL HIGH (ref 4.0–10.5)
nRBC: 0.1 % (ref 0.0–0.2)

## 2024-06-30 LAB — RENAL FUNCTION PANEL
Albumin: 2.4 g/dL — ABNORMAL LOW (ref 3.5–5.0)
Anion gap: 9 (ref 5–15)
BUN: 30 mg/dL — ABNORMAL HIGH (ref 6–20)
CO2: 30 mmol/L (ref 22–32)
Calcium: 9 mg/dL (ref 8.9–10.3)
Chloride: 110 mmol/L (ref 98–111)
Creatinine, Ser: 0.63 mg/dL (ref 0.44–1.00)
GFR, Estimated: >60 mL/min (ref >60)
Glucose, Bld: 242 mg/dL — ABNORMAL HIGH (ref 70–99)
Phosphorus: 3.1 mg/dL (ref 2.5–4.6)
Potassium: 3.8 mmol/L (ref 3.5–5.1)
Sodium: 149 mmol/L — ABNORMAL HIGH (ref 135–145)

## 2024-06-30 LAB — ACID FAST SMEAR (AFB, MYCOBACTERIA): Acid Fast Smear: NEGATIVE

## 2024-06-30 LAB — LEGIONELLA PNEUMOPHILA SEROGP 1 UR AG: L. pneumophila Serogp 1 Ur Ag: NEGATIVE

## 2024-06-30 LAB — TROPONIN T, HIGH SENSITIVITY
Troponin T High Sensitivity: 17 ng/L (ref 0–19)
Troponin T High Sensitivity: 17 ng/L (ref 0–19)

## 2024-06-30 LAB — PHOSPHORUS: Phosphorus: 1.5 mg/dL — ABNORMAL LOW (ref 2.5–4.6)

## 2024-06-30 LAB — POTASSIUM: Potassium: 4 mmol/L (ref 3.5–5.1)

## 2024-06-30 LAB — MAGNESIUM
Magnesium: 2.3 mg/dL (ref 1.7–2.4)
Magnesium: 1.7 mg/dL (ref 1.7–2.4)

## 2024-06-30 LAB — HEMOGLOBIN A1C
Hgb A1c MFr Bld: 6 % — ABNORMAL HIGH (ref 4.8–5.6)
Mean Plasma Glucose: 125.5 mg/dL

## 2024-06-30 MED ORDER — LACTATED RINGERS IV BOLUS
250.0000 mL | Freq: Once | INTRAVENOUS | Status: AC
  Administered 2024-06-30: 22:00:00 250 mL via INTRAVENOUS

## 2024-06-30 MED ORDER — BUDESONIDE 0.5 MG/2ML IN SUSP
0.5000 mg | Freq: Once | RESPIRATORY_TRACT | Status: AC
  Administered 2024-06-30: 02:00:00 0.5 mg via RESPIRATORY_TRACT
  Filled 2024-06-30: qty 2

## 2024-06-30 MED ORDER — FUROSEMIDE 10 MG/ML IJ SOLN
60.0000 mg | Freq: Once | INTRAMUSCULAR | Status: AC
  Administered 2024-06-30: 10:00:00 60 mg via INTRAVENOUS
  Filled 2024-06-30: qty 6

## 2024-06-30 MED ORDER — K PHOS MONO-SOD PHOS DI & MONO 155-852-130 MG PO TABS
500.0000 mg | ORAL_TABLET | ORAL | Status: DC
  Administered 2024-06-30 – 2024-07-01 (×3): 500 mg
  Filled 2024-06-30 (×5): qty 2

## 2024-06-30 MED ORDER — ACETAMINOPHEN 325 MG PO TABS
650.0000 mg | ORAL_TABLET | Freq: Four times a day (QID) | ORAL | Status: DC | PRN
  Administered 2024-06-30 – 2024-07-02 (×2): 650 mg
  Filled 2024-06-30 (×2): qty 2

## 2024-06-30 MED ORDER — MAGNESIUM SULFATE 2 GM/50ML IV SOLN
2.0000 g | Freq: Once | INTRAVENOUS | Status: AC
  Administered 2024-06-30: 02:00:00 2 g via INTRAVENOUS
  Filled 2024-06-30: qty 50

## 2024-06-30 MED ORDER — FREE WATER
200.0000 mL | Status: DC
  Administered 2024-06-30 – 2024-07-01 (×5): 200 mL

## 2024-06-30 MED ORDER — SODIUM CHLORIDE 0.9 % IV BOLUS
250.0000 mL | Freq: Once | INTRAVENOUS | Status: AC
  Administered 2024-06-30: 21:00:00 250 mL via INTRAVENOUS

## 2024-06-30 MED ORDER — LACTATED RINGERS IV BOLUS
500.0000 mL | Freq: Once | INTRAVENOUS | Status: AC
  Administered 2024-06-30: 22:00:00 500 mL via INTRAVENOUS

## 2024-06-30 MED ORDER — INSULIN ASPART 100 UNIT/ML IJ SOLN
0.0000 [IU] | INTRAMUSCULAR | Status: DC
  Administered 2024-06-30: 17:00:00 5 [IU] via SUBCUTANEOUS
  Administered 2024-06-30: 3 [IU] via SUBCUTANEOUS
  Administered 2024-06-30: 20:00:00 8 [IU] via SUBCUTANEOUS
  Administered 2024-06-30: 09:00:00 3 [IU] via SUBCUTANEOUS
  Administered 2024-06-30: 12:00:00 8 [IU] via SUBCUTANEOUS
  Administered 2024-07-01 (×2): 2 [IU] via SUBCUTANEOUS
  Administered 2024-07-01: 16:00:00 8 [IU] via SUBCUTANEOUS
  Administered 2024-07-01: 23:00:00 5 [IU] via SUBCUTANEOUS
  Administered 2024-07-01 (×2): 3 [IU] via SUBCUTANEOUS
  Filled 2024-06-30: qty 8
  Filled 2024-06-30 (×3): qty 3
  Filled 2024-06-30: qty 8
  Filled 2024-06-30: qty 3
  Filled 2024-06-30: qty 2
  Filled 2024-06-30: qty 8
  Filled 2024-06-30 (×2): qty 5
  Filled 2024-06-30: qty 2

## 2024-06-30 NOTE — Progress Notes (Signed)
 NAME:  [name] Phillips, MRN:  [id number], DOB:  11/19/1980, LOS: 3 ADMISSION DATE:  06/27/2024  CHIEF COMPLAINT:  severe resp failure, COCAINE ABUSE AND STREP PNEUMONIA   History of Present Illness:   43 year old female with history of polysubstance use (IVDU with fentanyl , last injection 4 days ago) who presents with increased shortness of breath and admitted for management of pneumonia. +COCAINE  Patient with worsening respiratory status with increased work of breathing, worsening hypoxia, and increased tachycardia. With her very severe pneumonia we would need to escalate therapy further with intubation, mechanical ventilation, and consideration for bronchoscopy to clear airway secretions. Discussed with the patient and her husband at bedside and she consents to proceed.     Significant Hospital Events: Including procedures, antibiotic start and stop dates in addition to other pertinent events   06/27/24: admit with right sided pneumonia and respiratory failure. CXR with right lung white out, INTUBATED, S/p BRONCH, ART LINE PLACED 12/14 BLOOD CX +STREP PNEUMONIA 12/15 remains on vent 12/16 remains on vent 12/17 remains on vent, severe hypoxia       Micro Data:  12/14 BLOOD CX + STREP PNEUMONIA  Antimicrobials:   Antibiotics Given (last 72 hours)     Date/Time Action Medication Dose Rate   06/27/24 1130 New Bag/Given   cefTRIAXone  (ROCEPHIN ) 1 g in sodium chloride  0.9 % 100 mL IVPB 1 g 200 mL/hr   06/27/24 1449 New Bag/Given   azithromycin  (ZITHROMAX ) 500 mg in sodium chloride  0.9 % 250 mL IVPB 500 mg 250 mL/hr   06/27/24 1744 New Bag/Given   piperacillin -tazobactam (ZOSYN ) IVPB 3.375 g 3.375 g 12.5 mL/hr   06/27/24 1808 New Bag/Given   linezolid  (ZYVOX ) IVPB 600 mg 600 mg 300 mL/hr   06/28/24 0221 New Bag/Given   piperacillin -tazobactam (ZOSYN ) IVPB 3.375 g 3.375 g 12.5 mL/hr   06/28/24 9373 New Bag/Given   linezolid  (ZYVOX ) IVPB 600 mg 600 mg 300 mL/hr   06/28/24  0959 New Bag/Given   piperacillin -tazobactam (ZOSYN ) IVPB 3.375 g 3.375 g 12.5 mL/hr   06/28/24 1621 New Bag/Given   cefTRIAXone  (ROCEPHIN ) 2 g in sodium chloride  0.9 % 100 mL IVPB 2 g 200 mL/hr   06/29/24 1403 New Bag/Given   azithromycin  (ZITHROMAX ) 500 mg in sodium chloride  0.9 % 250 mL IVPB 500 mg 250 mL/hr   06/29/24 1507 New Bag/Given   cefTRIAXone  (ROCEPHIN ) 2 g in sodium chloride  0.9 % 100 mL IVPB 2 g 200 mL/hr            Interim History / Subjective:  Remains critically ill Remains intubated Severe hypoxia Requires VENT support for survival   Vent Mode: PRVC FiO2 (%):  [65 %-100 %] 75 % Set Rate:  [22 bmp-26 bmp] 26 bmp Vt Set:  [400 mL] 400 mL PEEP:  [5 cmH20-12 cmH20] 12 cmH20 Plateau Pressure:  [19 cmH20] 19 cmH20       Objective   Blood pressure (!) 164/80, pulse 83, temperature (!) 96.8 F (36 C), temperature source Axillary, resp. rate (!) 26, height 5' 4 (1.626 m), weight 73.5 kg, last menstrual period 06/08/2024, SpO2 97%.    Vent Mode: PRVC FiO2 (%):  [65 %-100 %] 75 % Set Rate:  [22 bmp-26 bmp] 26 bmp Vt Set:  [400 mL] 400 mL PEEP:  [5 cmH20-12 cmH20] 12 cmH20 Plateau Pressure:  [19 cmH20] 19 cmH20   Intake/Output Summary (Last 24 hours) at 06/30/2024 0811 Last data filed at 06/30/2024 0400 Gross per 24 hour  Intake 2560.03 ml  Output 2475 ml  Net 85.03 ml   Filed Weights   06/27/24 1025 06/27/24 1127 06/27/24 1614  Weight: 73.5 kg 73.5 kg 73.5 kg      REVIEW OF SYSTEMS  PATIENT IS UNABLE TO PROVIDE COMPLETE REVIEW OF SYSTEMS DUE TO SEVERE CRITICAL ILLNESS   PHYSICAL EXAMINATION:  GENERAL:critically ill appearing, +resp distress EYES: Pupils equal, round, reactive to light.  No scleral icterus.  MOUTH: Moist mucosal membrane. INTUBATED NECK: Supple.  PULMONARY: Lungs clear to auscultation, +rhonchi, +wheezing CARDIOVASCULAR: S1 and S2.  Regular rate and rhythm GASTROINTESTINAL: Soft, nontender, -distended. Positive bowel  sounds.  MUSCULOSKELETAL: No swelling, clubbing, or edema.  NEUROLOGIC: obtunded,sedated SKIN:normal, warm to touch, Capillary refill delayed  Pulses present bilaterally   ASSESSMENT AND PLAN SYNOPSIS  43 yo white female with severe ALI from acute severe bacterial pneumonia STREP Pneumonia with bacteremia leading to severe hypoxic resp failure metabolic encephalopathy, leading to emergent intubation  Severe ACUTE Hypoxic and Hypercapnic Respiratory Failure -continue Mechanical Ventilator support -Wean Fio2 and PEEP as tolerated -VAP/VENT bundle implementation - Wean PEEP & FiO2 as tolerated, maintain SpO2 > 88% - Head of bed elevated 30 degrees, VAP protocol in place - Plateau pressures less than 30 cm H20  - Intermittent chest x-ray & ABG PRN - Ensure adequate pulmonary hygiene  Severe HYPOXIA unable to wean today    Vent Mode: PRVC FiO2 (%):  [65 %-100 %] 75 % Set Rate:  [22 bmp-26 bmp] 26 bmp Vt Set:  [400 mL] 400 mL PEEP:  [5 cmH20-12 cmH20] 12 cmH20 Plateau Pressure:  [19 cmH20] 19 cmH20   CARDIAC -oxygen as needed -follow up cardiac enzymes as indicated   CARDIAC ICU monitoring  RENAL -continue Foley Catheter-assess need -Avoid nephrotoxic agents -Follow urine output, BMP -Ensure adequate renal perfusion, optimize oxygenation -Renal dose medications   Intake/Output Summary (Last 24 hours) at 06/30/2024 9187 Last data filed at 06/30/2024 0400 Gross per 24 hour  Intake 2560.03 ml  Output 2475 ml  Net 85.03 ml   NEUROLOGY ACUTE METABOLIC ENCEPHALOPATHY -need for sedation -Goal RASS -2 to -3   SEPTIC shock SOURCE-strep pneumonia -use vasopressors to keep MAP>65 as needed -follow ABG and LA as needed -follow up cultures -emperic ABX   INFECTIOUS DISEASE -continue antibiotics as prescribed -follow up cultures   ENDO - ICU hypoglycemic\Hyperglycemia protocol -check FSBS per protocol   GI GI PROPHYLAXIS as indicated NUTRITIONAL  STATUS DIET-->TF's as tolerated Constipation protocol as indicated   ELECTROLYTES -follow labs as needed -replace as needed -pharmacy consultation and following  RESTRICTIVE TRANSFUSION PROTOCOL TRANSFUSION  IF HGB<7  or ACTIVE BLEEDING OR DX of ACUTE CORONARY SYNDROMES        Best practice (right click and Reselect all SmartList Selections daily)  Diet:  NPO Pain/Anxiety/Delirium protocol (if indicated): Yes (RASS goal -2) VAP protocol (if indicated): Yes DVT prophylaxis: Subcutaneous Heparin  GI prophylaxis: H2B Central venous access:  N/A Arterial line:  Yes, and it is still needed Foley:  Yes, and it is still needed Mobility:  bed rest  Code Status:  FULL CODE Disposition: ICU  Labs   CBC: Recent Labs  Lab 06/27/24 1042 06/28/24 0338 06/29/24 0505 06/30/24 0346  WBC 20.8* 35.4* 32.6* 28.3*  NEUTROABS 18.7*  --   --   --   HGB 11.3* 10.0* 8.7* 9.2*  HCT 33.5* 29.5* 25.0* 28.4*  MCV 89.6 88.3 87.1 91.6  PLT 399 438* 367 305    Basic Metabolic Panel: Recent Labs  Lab 06/27/24 1042 06/28/24 0338 06/29/24 0505 06/29/24 2204 06/30/24 0346  NA 133* 136 142  --  149*  K 3.1* 3.5 2.9* 4.2 3.8  CL 94* 100 105  --  110  CO2 22 22 26   --  30  GLUCOSE 109* 159* 168*  --  242*  BUN 67* 50* 36*  --  30*  CREATININE 2.05* 1.24* 0.81  --  0.63  CALCIUM  8.9 8.3* 8.6*  --  9.0  MG  --  1.9 2.3  --  2.3  PHOS  --  6.8* 4.1  --  3.1   GFR: Estimated Creatinine Clearance: 87.2 mL/min (by C-G formula based on SCr of 0.63 mg/dL). Recent Labs  Lab 06/27/24 1042 06/28/24 0338 06/28/24 0605 06/29/24 0505 06/30/24 0346  WBC 20.8* 35.4*  --  32.6* 28.3*  LATICACIDVEN 2.4*  --  1.3  --   --     Liver Function Tests: Recent Labs  Lab 06/29/24 0505 06/30/24 0346  ALBUMIN 2.2* 2.4*   No results for input(s): LIPASE, AMYLASE in the last 168 hours. No results for input(s): AMMONIA in the last 168 hours.  ABG    Component Value Date/Time   PHART  7.39 06/30/2024 0500   PCO2ART 54 (H) 06/30/2024 0500   PO2ART 89 06/30/2024 0500   HCO3 32.7 (H) 06/30/2024 0500   ACIDBASEDEF 3.2 (H) 06/28/2024 0544   O2SAT 99 06/30/2024 0500     Coagulation Profile: No results for input(s): INR, PROTIME in the last 168 hours.  Cardiac Enzymes: No results for input(s): CKTOTAL, CKMB, CKMBINDEX, TROPONINI in the last 168 hours.  HbA1C: Hemoglobin A1C  Date/Time Value Ref Range Status  07/21/2012 04:48 AM 4.8 4.2 - 6.3 % Final    Comment:    The American Diabetes Association recommends that a primary goal of therapy should be <7% and that physicians should reevaluate the treatment regimen in patients with HbA1c values consistently >8%.    Hgb A1c MFr Bld  Date/Time Value Ref Range Status  06/29/2024 05:04 AM 6.0 (H) 4.8 - 5.6 % Final    Comment:    (NOTE) Diagnosis of Diabetes The following HbA1c ranges recommended by the American Diabetes Association (ADA) may be used as an aid in the diagnosis of diabetes mellitus.  Hemoglobin             Suggested A1C NGSP%              Diagnosis  <5.7                   Non Diabetic  5.7-6.4                Pre-Diabetic  >6.4                   Diabetic  <7.0                   Glycemic control for                       adults with diabetes.      CBG: Recent Labs  Lab 06/29/24 1632 06/29/24 1928 06/29/24 2322 06/30/24 0330 06/30/24 0727  GLUCAP 197* 190* 200* 228* 193*    Allergies Allergies[1]    DVT/GI PRX  assessed I Assessed the need for Labs I Assessed the need for Foley I Assessed the need for Central Venous Line Family Discussion when available I Assessed the need for  Mobilization I made an Assessment of medications to be adjusted accordingly Safety Risk assessment completed  CASE DISCUSSED IN MULTIDISCIPLINARY ROUNDS WITH ICU TEAM     Critical Care Time devoted to patient care services described in this note is 62 minutes.  Critical care was  necessary to treat /prevent imminent and life-threatening deterioration. Overall, patient is critically ill, prognosis is guarded.  Patient with Multiorgan failure and at high risk for cardiac arrest and death.    Nickolas Alm Cellar, M.D.  [facility]  Medical Director [facility]            [1]  Allergies Allergen Reactions   Varenicline  Tartrate Other (See Comments)    varenicline    Wellbutrin  [Bupropion ]     Suicidal thoughts

## 2024-06-30 NOTE — Procedures (Signed)
 Central Venous Catheter Insertion Procedure Note  [name] Phillips  [id number]  [birth year]  Date:06/30/2024  Time:4:09 PM   Provider Performing:Fleetwood Pierron   Procedure: Insertion of Non-tunneled Central Venous Catheter(36556) with US  guidance (23062)   Indication(s) Medication administration and Difficult access  Consent Risks of the procedure as well as the alternatives and risks of each were explained to the patient and/or caregiver.  Consent for the procedure was obtained and is signed in the bedside chart  Anesthesia Topical only with 1% lidocaine    Timeout Verified patient identification, verified procedure, site/side was marked, verified correct patient position, special equipment/implants available, medications/allergies/relevant history reviewed, required imaging and test results available.  Sterile Technique Maximal sterile technique including full sterile barrier drape, hand hygiene, sterile gown, sterile gloves, mask, hair covering, sterile ultrasound probe cover (if used).  Procedure Description Area of catheter insertion was cleaned with chlorhexidine  and draped in sterile fashion.  With real-time ultrasound guidance a central venous catheter was placed into the left internal jugular vein. Nonpulsatile blood flow and easy flushing noted in all ports.  The catheter was sutured in place and sterile dressing applied.  Complications/Tolerance None; patient tolerated the procedure well. Chest X-ray is ordered to verify placement for internal jugular or subclavian cannulation.   Chest x-ray is not ordered for femoral cannulation.  EBL Minimal  Specimen(s) None   Robet Kim, PA-C [facility] P[facility]M Team Contact Info: [phone]

## 2024-06-30 NOTE — Progress Notes (Addendum)
 Patient has generalized edema BL upper and lower extremities, rings (2 yellow rings, one with clear stones and one yellow band) were removed from left ring finger, placed in specimen container and given to husband this morning when he arrived.

## 2024-06-30 NOTE — Progress Notes (Addendum)
 PHARMACY CONSULT NOTE - FOLLOW UP  Pharmacy Consult for Electrolyte Monitoring and Replacement   Recent Labs: Potassium (mmol/L)  Date Value  06/30/2024 3.8  10/05/2013 3.5   Magnesium  (mg/dL)  Date Value  87/82/7974 2.3  01/29/2013 2.1   Calcium  (mg/dL)  Date Value  87/82/7974 9.0   Calcium , Total (mg/dL)  Date Value  96/75/7984 9.1   Albumin (g/dL)  Date Value  87/82/7974 2.4 (L)  10/05/2013 4.3   Phosphorus (mg/dL)  Date Value  87/82/7974 3.1  07/21/2012 2.1 (L)   Sodium (mmol/L)  Date Value  06/30/2024 149 (H)  10/05/2013 135 (L)    Assessment: 43 y.o. female with medical history significant of essential hypertension, seizure disorder, polysubstance abuse, recent use of fentanyl  and heroin, who presents to the hospital with progressively worsening short of breath, hypoxia.  Pharmacy is asked to follow and replace electrolytes while in CCU  Nutrition: Vital AF at 40 mL/hr + FWF 30 mL every 4 hours  Diuretics: furosemide  60 mg IV x 1  Goal of Therapy:  Electrolytes WNL  Plan:  ---increase free water  flushes to 200 mL every 4 hours ISO hypernatremia ---recheck electrolytes in am  Adriana JONETTA Bolster ,PharmD Clinical Pharmacist 06/30/2024 7:22 AM

## 2024-06-30 NOTE — Plan of Care (Addendum)
 Patient continues to be intubated and sedated. Dilaudid  is being titrated and currently infusing at 4 mg/ml. Versed  is being titrated and currently infusing at 10 mg/ml. Propofol  is being titrated and is currently infusing at 10 mcg/min. Patient has PRN Vecuronium  and has required it X's 2 this shift. When turning the patient to bath after BM, patient saturations dropped to 85% and required 100% oxygenation for a short period of time. NP and RT notified and came to bedside. ABG and Chest Xray ordered and shows improvement. IBP elevated at the start of shift. NP notified; Hydralazine  and Labetalol  given to keep SBP <185. Ice pack provided to patient neck and cold washcloth kept on forehead per husbands request. He states this is something that she does at home daily. Per NP, patient has very little reserve and would recommend supplementing patient with 100% oxygen for extended turns. Patient also has PRN versed  boluses ordered if needed. Patient has generalized edema BL upper and lower extremities, rings ( 2 yellow rings, one with clear stones and one yellow band) were removed from left ring finger, placed in specimen container and given to husband this morning when he arrived. Patient provided oral care with repositioning.  Problem: Education: Goal: Knowledge of General Education information will improve Description: Including pain rating scale, medication(s)/side effects and non-pharmacologic comfort measures Outcome: Progressing   Problem: Clinical Measurements: Goal: Ability to maintain clinical measurements within normal limits will improve Outcome: Progressing Goal: Will remain free from infection Outcome: Progressing Goal: Diagnostic test results will improve Outcome: Progressing Goal: Respiratory complications will improve Outcome: Progressing Goal: Cardiovascular complication will be avoided Outcome: Progressing   Problem: Nutrition: Goal: Adequate nutrition will be maintained Outcome:  Progressing   Problem: Elimination: Goal: Will not experience complications related to bowel motility Outcome: Progressing Goal: Will not experience complications related to urinary retention Outcome: Progressing   Problem: Pain Managment: Goal: General experience of comfort will improve and/or be controlled Outcome: Progressing   Problem: Skin Integrity: Goal: Risk for impaired skin integrity will decrease Outcome: Progressing

## 2024-06-30 NOTE — Plan of Care (Signed)
°  Problem: Nutrition: Goal: Adequate nutrition will be maintained Outcome: Progressing   Problem: Elimination: Goal: Will not experience complications related to urinary retention Outcome: Progressing   Problem: Tissue Perfusion: Goal: Adequacy of tissue perfusion will improve Outcome: Progressing   Problem: Education: Goal: Knowledge of General Education information will improve Description: Including pain rating scale, medication(s)/side effects and non-pharmacologic comfort measures Outcome: Not Progressing   Problem: Health Behavior/Discharge Planning: Goal: Ability to manage health-related needs will improve Outcome: Not Progressing

## 2024-07-01 ENCOUNTER — Inpatient Hospital Stay

## 2024-07-01 DIAGNOSIS — J154 Pneumonia due to other streptococci: Secondary | ICD-10-CM | POA: Diagnosis not present

## 2024-07-01 DIAGNOSIS — J9602 Acute respiratory failure with hypercapnia: Secondary | ICD-10-CM | POA: Diagnosis not present

## 2024-07-01 DIAGNOSIS — G9341 Metabolic encephalopathy: Secondary | ICD-10-CM | POA: Diagnosis not present

## 2024-07-01 DIAGNOSIS — J9601 Acute respiratory failure with hypoxia: Secondary | ICD-10-CM | POA: Diagnosis not present

## 2024-07-01 LAB — TRIGLYCERIDES: Triglycerides: 273 mg/dL — ABNORMAL HIGH (ref <150)

## 2024-07-01 LAB — RENAL FUNCTION PANEL
Albumin: 2.4 g/dL — ABNORMAL LOW (ref 3.5–5.0)
Anion gap: 8 (ref 5–15)
BUN: 35 mg/dL — ABNORMAL HIGH (ref 6–20)
CO2: 36 mmol/L — ABNORMAL HIGH (ref 22–32)
Calcium: 8.7 mg/dL — ABNORMAL LOW (ref 8.9–10.3)
Chloride: 114 mmol/L — ABNORMAL HIGH (ref 98–111)
Creatinine, Ser: 0.59 mg/dL (ref 0.44–1.00)
GFR, Estimated: >60 mL/min (ref >60)
Glucose, Bld: 173 mg/dL — ABNORMAL HIGH (ref 70–99)
Phosphorus: 2.7 mg/dL (ref 2.5–4.6)
Potassium: 4.1 mmol/L (ref 3.5–5.1)
Sodium: 157 mmol/L — ABNORMAL HIGH (ref 135–145)

## 2024-07-01 LAB — BLOOD GAS, ARTERIAL
Acid-Base Excess: 9.9 mmol/L — ABNORMAL HIGH (ref 0.0–2.0)
Acid-Base Excess: 14.8 mmol/L — ABNORMAL HIGH (ref 0.0–2.0)
Bicarbonate: 40.8 mmol/L — ABNORMAL HIGH (ref 20.0–28.0)
Bicarbonate: 41.7 mmol/L — ABNORMAL HIGH (ref 20.0–28.0)
FIO2: 70 %
MECHVT: 390 mL
Mechanical Rate: 28
O2 Saturation: 100 %
O2 Saturation: 99.5 %
PEEP: 10 cmH2O
Patient temperature: 37
Patient temperature: 37
pCO2 arterial: 91 mmHg (ref 32–48)
pCO2 arterial: 60 mmHg — ABNORMAL HIGH (ref 32–48)
pH, Arterial: 7.26 — ABNORMAL LOW (ref 7.35–7.45)
pH, Arterial: 7.45 (ref 7.35–7.45)
pO2, Arterial: 132 mmHg — ABNORMAL HIGH (ref 83–108)
pO2, Arterial: 110 mmHg — ABNORMAL HIGH (ref 83–108)

## 2024-07-01 LAB — ECHOCARDIOGRAM COMPLETE
AR max vel: 2.49 cm2
AV Peak grad: 9.5 mmHg
Ao pk vel: 1.54 m/s
Area-P 1/2: 3.93 cm2
Height: 64 in
S' Lateral: 2.6 cm
Weight: 2592 [oz_av]

## 2024-07-01 LAB — HISTOPLASMA ANTIGEN, URINE: Histoplasma Antigen, urine: NEGATIVE (ref <0.2)

## 2024-07-01 LAB — CBC
HCT: 25.9 % — ABNORMAL LOW (ref 36.0–46.0)
Hemoglobin: 8.2 g/dL — ABNORMAL LOW (ref 12.0–15.0)
MCH: 29.9 pg (ref 26.0–34.0)
MCHC: 31.7 g/dL (ref 30.0–36.0)
MCV: 94.5 fL (ref 80.0–100.0)
Platelets: 267 K/uL (ref 150–400)
RBC: 2.74 MIL/uL — ABNORMAL LOW (ref 3.87–5.11)
RDW: 15.6 % — ABNORMAL HIGH (ref 11.5–15.5)
WBC: 20.8 K/uL — ABNORMAL HIGH (ref 4.0–10.5)
nRBC: 0.1 % (ref 0.0–0.2)

## 2024-07-01 LAB — GLUCOSE, CAPILLARY
Glucose-Capillary: 157 mg/dL — ABNORMAL HIGH (ref 70–99)
Glucose-Capillary: 134 mg/dL — ABNORMAL HIGH (ref 70–99)
Glucose-Capillary: 178 mg/dL — ABNORMAL HIGH (ref 70–99)
Glucose-Capillary: 269 mg/dL — ABNORMAL HIGH (ref 70–99)
Glucose-Capillary: 135 mg/dL — ABNORMAL HIGH (ref 70–99)
Glucose-Capillary: 237 mg/dL — ABNORMAL HIGH (ref 70–99)

## 2024-07-01 LAB — MAGNESIUM
Magnesium: 1.6 mg/dL — ABNORMAL LOW (ref 1.7–2.4)
Magnesium: 1.7 mg/dL (ref 1.7–2.4)

## 2024-07-01 LAB — SODIUM
Sodium: 157 mmol/L — ABNORMAL HIGH (ref 135–145)
Sodium: 156 mmol/L — ABNORMAL HIGH (ref 135–145)

## 2024-07-01 LAB — FUNGITELL BETA-D-GLUCAN: Fungitell Value:: 42.821 pg/mL

## 2024-07-01 LAB — ASPERGILLUS ANTIGEN, BAL/SERUM: Aspergillus Ag, BAL/Serum: 0.04 {index} (ref 0.00–0.49)

## 2024-07-01 MED ORDER — BUDESONIDE 0.5 MG/2ML IN SUSP
0.5000 mg | Freq: Once | RESPIRATORY_TRACT | Status: AC
  Administered 2024-07-01: 03:00:00 0.5 mg via RESPIRATORY_TRACT
  Filled 2024-07-01: qty 2

## 2024-07-01 MED ORDER — FREE WATER
200.0000 mL | Status: DC
  Administered 2024-07-01 – 2024-07-05 (×50): 200 mL

## 2024-07-01 MED ORDER — DEXTROSE 5 % IV SOLN
INTRAVENOUS | Status: DC
  Administered 2024-07-01: 17:00:00 40 mL/h via INTRAVENOUS

## 2024-07-01 MED ORDER — CLONIDINE HCL 0.1 MG PO TABS
0.2000 mg | ORAL_TABLET | Freq: Three times a day (TID) | ORAL | Status: DC
  Administered 2024-07-01 – 2024-07-05 (×10): 0.2 mg
  Filled 2024-07-01 (×12): qty 2

## 2024-07-01 MED ORDER — LACTATED RINGERS IV SOLN: INTRAVENOUS | Status: DC

## 2024-07-01 MED ORDER — METOPROLOL TARTRATE 5 MG/5ML IV SOLN
5.0000 mg | Freq: Once | INTRAVENOUS | Status: AC
  Administered 2024-07-01: 02:00:00 5 mg via INTRAVENOUS
  Filled 2024-07-01: qty 5

## 2024-07-01 MED ORDER — SODIUM CHLORIDE 0.9 % IV SOLN
2.0000 g | Freq: Three times a day (TID) | INTRAVENOUS | Status: AC
  Administered 2024-07-01 – 2024-07-07 (×19): 2 g via INTRAVENOUS
  Filled 2024-07-01 (×19): qty 12.5

## 2024-07-01 MED ORDER — METOPROLOL TARTRATE 5 MG/5ML IV SOLN
5.0000 mg | Freq: Once | INTRAVENOUS | Status: AC
  Administered 2024-07-01: 5 mg via INTRAVENOUS
  Filled 2024-07-01: qty 5

## 2024-07-01 MED ORDER — QUETIAPINE FUMARATE 25 MG PO TABS: 25.0000 mg | ORAL_TABLET | Freq: Two times a day (BID) | ORAL | Status: DC

## 2024-07-01 MED ORDER — LINEZOLID 600 MG/300ML IV SOLN
600.0000 mg | Freq: Two times a day (BID) | INTRAVENOUS | Status: AC
  Administered 2024-07-01 – 2024-07-07 (×14): 600 mg via INTRAVENOUS
  Filled 2024-07-01 (×14): qty 300

## 2024-07-01 MED ORDER — NICARDIPINE HCL IN NACL 20-0.86 MG/200ML-% IV SOLN
0.0000 mg/h | INTRAVENOUS | Status: DC
  Filled 2024-07-01: qty 200

## 2024-07-01 MED ORDER — METOPROLOL TARTRATE 5 MG/5ML IV SOLN
5.0000 mg | Freq: Four times a day (QID) | INTRAVENOUS | Status: DC | PRN
  Administered 2024-07-01 – 2024-07-05 (×3): 5 mg via INTRAVENOUS
  Filled 2024-07-01 (×4): qty 5

## 2024-07-01 MED ADMIN — Magnesium Sulfate IV Soln 2 GM/50ML (40 MG/ML): 2 g | INTRAVENOUS | @ 12:00:00 | NDC 63323010602

## 2024-07-01 MED FILL — Magnesium Sulfate IV Soln 2 GM/50ML (40 MG/ML): 2.0000 g | INTRAVENOUS | Qty: 50 | Status: AC

## 2024-07-01 NOTE — Progress Notes (Signed)
 PHARMACY CONSULT NOTE - FOLLOW UP  Pharmacy Consult for Electrolyte Monitoring and Replacement   Recent Labs: Potassium (mmol/L)  Date Value  07/01/2024 4.1  10/05/2013 3.5   Magnesium  (mg/dL)  Date Value  87/81/7974 1.6 (L)  01/29/2013 2.1   Calcium  (mg/dL)  Date Value  87/81/7974 8.7 (L)   Calcium , Total (mg/dL)  Date Value  96/75/7984 9.1   Albumin (g/dL)  Date Value  87/81/7974 2.4 (L)  10/05/2013 4.3   Phosphorus (mg/dL)  Date Value  87/81/7974 2.7  07/21/2012 2.1 (L)   Sodium (mmol/L)  Date Value  07/01/2024 157 (H)  10/05/2013 135 (L)    Assessment: 43 y.o. female with medical history significant of essential hypertension, seizure disorder, polysubstance abuse, recent use of fentanyl  and heroin, who presents to the hospital with progressively worsening short of breath, hypoxia.  Pharmacy is asked to follow and replace electrolytes while in CCU  Nutrition: Vital AF at 50 mL/hr + FWF 200 mL every 4 hours  Goal of Therapy:  Electrolytes WNL  Plan:  ---2 grams IV magnesium  sulfate x 1 ---increase free water  flushes to 200 mL every 2 hours ISO hypernatremia ---recheck electrolytes in am  Adriana JONETTA Bolster ,PharmD Clinical Pharmacist 07/01/2024 7:29 AM

## 2024-07-01 NOTE — Progress Notes (Addendum)
 0700 Husband in to visit 0730 Dr. Isaiah in to talk with husband 0800 Coughing and congested.Given bolus of Versed  and suctioned. No wake up assessment per verbal order Dr. Isaiah. 0830 B/P and heart rate elevated. Given prns as ordered.  1000 Clonidine  po ordered for blood pressure. 1300 Bronchoscopy per Dr.Kasa. 1400 Blood pressure improving. 1800 Patient turned via bed every 30 minutes.

## 2024-07-01 NOTE — Progress Notes (Signed)
 NAME:  [name] Phillips, MRN:  [id number], DOB:  July 27, 1980, LOS: 4 ADMISSION DATE:  06/27/2024  CHIEF COMPLAINT:  severe resp failure, COCAINE ABUSE AND STREP PNEUMONIA   History of Present Illness:   43 year old female with history of polysubstance use (IVDU with fentanyl , last injection 4 days ago) who presents with increased shortness of breath and admitted for management of pneumonia. +COCAINE  Patient with worsening respiratory status with increased work of breathing, worsening hypoxia, and increased tachycardia. With her very severe pneumonia we would need to escalate therapy further with intubation, mechanical ventilation, and consideration for bronchoscopy to clear airway secretions. Discussed with the patient and her husband at bedside and she consents to proceed.     Significant Hospital Events: Including procedures, antibiotic start and stop dates in addition to other pertinent events   06/27/24: admit with right sided pneumonia and respiratory failure. CXR with right lung white out, INTUBATED, S/p BRONCH, ART LINE PLACED 12/14 BLOOD CX +STREP PNEUMONIA 12/15 remains on vent 12/16 remains on vent 12/17 remains on vent, severe hypoxia, CVL placed 12/18 severe Hypoxia      Micro Data:  12/14 BLOOD CX + STREP PNEUMONIA  Antimicrobials:   Antibiotics Given (last 72 hours)     Date/Time Action Medication Dose Rate   06/28/24 0959 New Bag/Given   piperacillin -tazobactam (ZOSYN ) IVPB 3.375 g 3.375 g 12.5 mL/hr   06/28/24 1621 New Bag/Given   cefTRIAXone  (ROCEPHIN ) 2 g in sodium chloride  0.9 % 100 mL IVPB 2 g 200 mL/hr   06/29/24 1403 New Bag/Given   azithromycin  (ZITHROMAX ) 500 mg in sodium chloride  0.9 % 250 mL IVPB 500 mg 250 mL/hr   06/29/24 1507 New Bag/Given   cefTRIAXone  (ROCEPHIN ) 2 g in sodium chloride  0.9 % 100 mL IVPB 2 g 200 mL/hr   06/30/24 1624 New Bag/Given   azithromycin  (ZITHROMAX ) 500 mg in sodium chloride  0.9 % 250 mL IVPB 500 mg 250 mL/hr    06/30/24 1753 New Bag/Given   cefTRIAXone  (ROCEPHIN ) 2 g in sodium chloride  0.9 % 100 mL IVPB 2 g 200 mL/hr            Interim History / Subjective:  Remains critically ill Remains intubated Severe hypoxia Requires VENT support for survival   Vent Mode: PRVC FiO2 (%):  [70 %-100 %] 70 % Set Rate:  [26 bmp-28 bmp] 28 bmp Vt Set:  [350 mL-400 mL] 390 mL PEEP:  [10 cmH20-12 cmH20] 10 cmH20       Objective   Blood pressure (!) 164/80, pulse (!) 122, temperature (!) 100.5 F (38.1 C), temperature source Oral, resp. rate (!) 28, height 5' 4 (1.626 m), weight 73.5 kg, last menstrual period 06/08/2024, SpO2 98%.    Vent Mode: PRVC FiO2 (%):  [70 %-100 %] 70 % Set Rate:  [26 bmp-28 bmp] 28 bmp Vt Set:  [350 mL-400 mL] 390 mL PEEP:  [10 cmH20-12 cmH20] 10 cmH20   Intake/Output Summary (Last 24 hours) at 07/01/2024 0742 Last data filed at 07/01/2024 0400 Gross per 24 hour  Intake 4524.13 ml  Output 4100 ml  Net 424.13 ml   Filed Weights   06/27/24 1025 06/27/24 1127 06/27/24 1614  Weight: 73.5 kg 73.5 kg 73.5 kg     REVIEW OF SYSTEMS  PATIENT IS UNABLE TO PROVIDE COMPLETE REVIEW OF SYSTEMS DUE TO SEVERE CRITICAL ILLNESS   PHYSICAL EXAMINATION:  GENERAL:critically ill appearing, +resp distress EYES: Pupils equal, round, reactive to light.  No scleral icterus.  MOUTH: Moist mucosal membrane. INTUBATED NECK: Supple.  PULMONARY: Lungs clear to auscultation, +rhonchi, +wheezing CARDIOVASCULAR: S1 and S2.  Regular rate and rhythm GASTROINTESTINAL: Soft, nontender, -distended. Positive bowel sounds.  MUSCULOSKELETAL: No swelling, clubbing, or edema.  NEUROLOGIC: obtunded,sedated SKIN:normal, warm to touch, Capillary refill delayed  Pulses present bilaterally   ASSESSMENT AND PLAN SYNOPSIS  43 yo white female with severe ALI from acute severe bacterial pneumonia STREP Pneumonia with bacteremia leading to severe hypoxic resp failure metabolic encephalopathy,  leading to emergent intubation  Severe ACUTE Hypoxic and Hypercapnic Respiratory Failure -continue Mechanical Ventilator support -Wean Fio2 and PEEP as tolerated -VAP/VENT bundle implementation - Wean PEEP & FiO2 as tolerated, maintain SpO2 > 88% - Head of bed elevated 30 degrees, VAP protocol in place - Plateau pressures less than 30 cm H20  - Intermittent chest x-ray & ABG PRN - Ensure adequate pulmonary hygiene  Severe HYPOXIA unable to wean today   Vent Mode: PRVC FiO2 (%):  [70 %-100 %] 70 % Set Rate:  [26 bmp-28 bmp] 28 bmp Vt Set:  [350 mL-400 mL] 390 mL PEEP:  [10 cmH20-12 cmH20] 10 cmH20   CARDIAC -oxygen as needed -follow up cardiac enzymes as indicated   CARDIAC ICU monitoring   RENAL -continue Foley Catheter-assess need -Avoid nephrotoxic agents -Follow urine output, BMP -Ensure adequate renal perfusion, optimize oxygenation -Renal dose medications   Intake/Output Summary (Last 24 hours) at 07/01/2024 0745 Last data filed at 07/01/2024 0400 Gross per 24 hour  Intake 4524.13 ml  Output 4100 ml  Net 424.13 ml     NEUROLOGY ACUTE METABOLIC ENCEPHALOPATHY -need for sedation -Goal RASS -2 to -3    SEPTIC shock SOURCE-strep pneumonia -use vasopressors to keep MAP>65 as needed -follow ABG and LA as needed Repeat Cultures pending    INFECTIOUS DISEASE -continue antibiotics as prescribed -follow up cultures   ENDO - ICU hypoglycemic\Hyperglycemia protocol -check FSBS per protocol   GI GI PROPHYLAXIS as indicated NUTRITIONAL STATUS DIET-->TF's as tolerated Constipation protocol as indicated   ELECTROLYTES -follow labs as needed -replace as needed -pharmacy consultation and following  RESTRICTIVE TRANSFUSION PROTOCOL TRANSFUSION  IF HGB<7  or ACTIVE BLEEDING OR DX of ACUTE CORONARY SYNDROMES    Best practice (right click and Reselect all SmartList Selections daily)  Diet:  NPO Pain/Anxiety/Delirium protocol (if indicated):  Yes (RASS goal -2) VAP protocol (if indicated): Yes DVT prophylaxis: Subcutaneous Heparin  GI prophylaxis: H2B Central venous access:  N/A Arterial line:  Yes, and it is still needed Foley:  Yes, and it is still needed Mobility:  bed rest  Code Status:  FULL CODE Disposition: ICU  Labs   CBC: Recent Labs  Lab 06/27/24 1042 06/28/24 0338 06/29/24 0505 06/30/24 0346 07/01/24 0425  WBC 20.8* 35.4* 32.6* 28.3* 20.8*  NEUTROABS 18.7*  --   --   --   --   HGB 11.3* 10.0* 8.7* 9.2* 8.2*  HCT 33.5* 29.5* 25.0* 28.4* 25.9*  MCV 89.6 88.3 87.1 91.6 94.5  PLT 399 438* 367 305 267    Basic Metabolic Panel: Recent Labs  Lab 06/27/24 1042 06/28/24 0338 06/29/24 0505 06/29/24 2204 06/30/24 0346 06/30/24 2127 07/01/24 0425  NA 133* 136 142  --  149*  --  157*  K 3.1* 3.5 2.9* 4.2 3.8 4.0 4.1  CL 94* 100 105  --  110  --  114*  CO2 22 22 26   --  30  --  36*  GLUCOSE 109* 159* 168*  --  242*  --  173*  BUN 67* 50* 36*  --  30*  --  35*  CREATININE 2.05* 1.24* 0.81  --  0.63  --  0.59  CALCIUM  8.9 8.3* 8.6*  --  9.0  --  8.7*  MG  --  1.9 2.3  --  2.3 1.7 1.6*  PHOS  --  6.8* 4.1  --  3.1 1.5* 2.7   GFR: Estimated Creatinine Clearance: 87.2 mL/min (by C-G formula based on SCr of 0.59 mg/dL). Recent Labs  Lab 06/27/24 1042 06/28/24 0338 06/28/24 0605 06/29/24 0505 06/30/24 0346 07/01/24 0425  WBC 20.8* 35.4*  --  32.6* 28.3* 20.8*  LATICACIDVEN 2.4*  --  1.3  --   --   --     Liver Function Tests: Recent Labs  Lab 06/29/24 0505 06/30/24 0346 07/01/24 0425  ALBUMIN 2.2* 2.4* 2.4*   No results for input(s): LIPASE, AMYLASE in the last 168 hours. No results for input(s): AMMONIA in the last 168 hours.  ABG    Component Value Date/Time   PHART 7.45 07/01/2024 0425   PCO2ART 60 (H) 07/01/2024 0425   PO2ART 110 (H) 07/01/2024 0425   HCO3 41.7 (H) 07/01/2024 0425   ACIDBASEDEF 3.2 (H) 06/28/2024 0544   O2SAT 99.5 07/01/2024 0425     Coagulation  Profile: No results for input(s): INR, PROTIME in the last 168 hours.  Cardiac Enzymes: No results for input(s): CKTOTAL, CKMB, CKMBINDEX, TROPONINI in the last 168 hours.  HbA1C: Hemoglobin A1C  Date/Time Value Ref Range Status  07/21/2012 04:48 AM 4.8 4.2 - 6.3 % Final    Comment:    The American Diabetes Association recommends that a primary goal of therapy should be <7% and that physicians should reevaluate the treatment regimen in patients with HbA1c values consistently >8%.    Hgb A1c MFr Bld  Date/Time Value Ref Range Status  06/29/2024 05:04 AM 6.0 (H) 4.8 - 5.6 % Final    Comment:    (NOTE) Diagnosis of Diabetes The following HbA1c ranges recommended by the American Diabetes Association (ADA) may be used as an aid in the diagnosis of diabetes mellitus.  Hemoglobin             Suggested A1C NGSP%              Diagnosis  <5.7                   Non Diabetic  5.7-6.4                Pre-Diabetic  >6.4                   Diabetic  <7.0                   Glycemic control for                       adults with diabetes.      CBG: Recent Labs  Lab 06/30/24 1122 06/30/24 1620 06/30/24 1928 06/30/24 2326 07/01/24 0320  GLUCAP 274* 219* 254* 170* 157*    Allergies Allergies[1]     DVT/GI PRX  assessed I Assessed the need for Labs I Assessed the need for Foley I Assessed the need for Central Venous Line Family Discussion when available I Assessed the need for Mobilization I made an Assessment of medications to be adjusted accordingly Safety Risk assessment completed  CASE DISCUSSED IN MULTIDISCIPLINARY ROUNDS WITH ICU TEAM  Critical Care Time devoted to patient care services described in this note is 55 minutes.  Critical care was necessary to treat /prevent imminent and life-threatening deterioration. Overall, patient is critically ill, prognosis is guarded.    Nickolas Alm Cellar, M.D.  [facility]   Medical Director [facility]                 [1]  Allergies Allergen Reactions   Varenicline  Tartrate Other (See Comments)    varenicline    Wellbutrin  [Bupropion ]     Suicidal thoughts

## 2024-07-01 NOTE — Procedures (Signed)
 PROCEDURE: BRONCHOSCOPY Therapeutic Aspiration of Tracheobronchial Tree  PROCEDURE DATE: 07/01/2024  TIME:  NAME:  [name] Phillips  DOB:[birth year]  MRN: [id number] LOC:  IC15A/IC15A-AA    HOSP DAY: @LENGTHOFSTAYDAYS @ CODE STATUS:      Code Status Orders  (From admission, onward)           Start     Ordered   06/27/24 1321  Full code  Continuous       Question:  By:  Answer:  Consent: discussion documented in EHR   06/27/24 1321           Code Status History     Date Active Date Inactive Code Status Order ID Comments User Context   11/02/2018 1551 11/03/2018 1504 Full Code [id number]  Fredia Dorothe HERO, MD Inpatient   11/02/2018 1551 11/02/2018 1551 Full Code [id number]  Fredia Dorothe HERO, MD Inpatient   11/01/2018 2020 11/02/2018 1547 Full Code [id number]  Edelmiro Leash, MD ED           Indications/Preliminary Diagnosis:   Consent: (Place X beside choice/s below)  The benefits, risks and possible complications of the procedure were        explained to:  ___ patient  _x__ patient's family  ___ other:___________  who verbalized understanding and gave:  ___ verbal  ___ written  _x__ verbal and written  ___ telephone  ___ other:________ consent.      Unable to obtain consent; procedure performed on emergent basis.     Other:       PRESEDATION ASSESSMENT: History and Physical has been performed. Patient meds and allergies have been reviewed. Presedation airway examination has been performed and documented. Baseline vital signs, sedation score, oxygenation status, and cardiac rhythm were reviewed. Patient was deemed to be in satisfactory condition to undergo the procedure.      PROCEDURE DETAILS: Timeout performed and correct patient, name, & ID confirmed. Following prep per Pulmonary policy, appropriate sedation was administered. The Bronchoscope was inserted in to oral cavity with bite block in place. Therapeutic aspiration of Tracheobronchial tree was performed.   Airway exam proceeded with findings, technical procedures, and specimen collection as noted below. At the end of exam the scope was withdrawn without incident. Impression and Plan as noted below.           Airway Prep (Place X beside choice below)   1% Transtracheal Lidocaine  Anesthetization 7 cc  x Patient prepped per Bronchoscopy       Insertion Route (Place X beside choice below)   Nasal   Oral  x Endotracheal Tube   Tracheostomy     Medication Amt Dose  Medication Amt Dose  Lidocaine  1%  cc  Epinephrine 1:10,000 sol  cc  Xylocaine  4%  cc  Cocaine  cc   TECHNICAL PROCEDURES: (Place X beside choice below)   Procedures  Description    None     Electrocautery     Cryotherapy     Balloon Dilatation     Bronchography     Stent Placement   x  Therapeutic Aspiration Thick mucoid secretions extracted from lower segments of lungs    Laser/Argon Plasma    Brachytherapy Catheter Placement    Foreign Body Removal         SPECIMENS (Sites): (Place X beside choice below)  Specimens Description   No Specimens Obtained     Washings   x Lavage 20cc's instilled, mucus/slight purulent secretions noted   Biopsies    Fine Needle Aspirates  Brushings    Sputum    FINDINGS: mucoid secretions and purulent secretions ESTIMATED BLOOD LOSS: none COMPLICATIONS/RESOLUTION: none      IMPRESSION:POST-PROCEDURE DX:  Pneumonia  RECOMMENDATION/PLAN:   Continue vent support   Nickolas Alm Cellar, M.D.  [facility]  Medical Director [facility] Medical Director [facility] Cardio-Pulmonary Department

## 2024-07-01 NOTE — Progress Notes (Signed)
 RT assisted provider with bedside bronchoscopy.  Consent obtained, timeout performed and procedure completed with no complications.  O2 saturation and vital signs remained stable throughout procedure.

## 2024-07-02 DIAGNOSIS — J154 Pneumonia due to other streptococci: Secondary | ICD-10-CM | POA: Diagnosis not present

## 2024-07-02 DIAGNOSIS — J9602 Acute respiratory failure with hypercapnia: Secondary | ICD-10-CM | POA: Diagnosis not present

## 2024-07-02 DIAGNOSIS — J9601 Acute respiratory failure with hypoxia: Secondary | ICD-10-CM | POA: Diagnosis not present

## 2024-07-02 DIAGNOSIS — G9341 Metabolic encephalopathy: Secondary | ICD-10-CM | POA: Diagnosis not present

## 2024-07-02 LAB — CBC
HCT: 24.6 % — ABNORMAL LOW (ref 36.0–46.0)
Hemoglobin: 7.5 g/dL — ABNORMAL LOW (ref 12.0–15.0)
MCH: 29.4 pg (ref 26.0–34.0)
MCHC: 30.5 g/dL (ref 30.0–36.0)
MCV: 96.5 fL (ref 80.0–100.0)
Platelets: 217 K/uL (ref 150–400)
RBC: 2.55 MIL/uL — ABNORMAL LOW (ref 3.87–5.11)
RDW: 15.1 % (ref 11.5–15.5)
WBC: 19.9 K/uL — ABNORMAL HIGH (ref 4.0–10.5)
nRBC: 0 % (ref 0.0–0.2)

## 2024-07-02 LAB — BLASTOMYCES ANTIGEN
Blastomyces Antigen: NOT DETECTED ng/mL
Interpretation: NEGATIVE

## 2024-07-02 LAB — MISC LABCORP TEST (SEND OUT): Labcorp test code: 914415

## 2024-07-02 LAB — GLUCOSE, CAPILLARY
Glucose-Capillary: 283 mg/dL — ABNORMAL HIGH (ref 70–99)
Glucose-Capillary: 183 mg/dL — ABNORMAL HIGH (ref 70–99)
Glucose-Capillary: 212 mg/dL — ABNORMAL HIGH (ref 70–99)
Glucose-Capillary: 256 mg/dL — ABNORMAL HIGH (ref 70–99)
Glucose-Capillary: 244 mg/dL — ABNORMAL HIGH (ref 70–99)
Glucose-Capillary: 221 mg/dL — ABNORMAL HIGH (ref 70–99)
Glucose-Capillary: 292 mg/dL — ABNORMAL HIGH (ref 70–99)

## 2024-07-02 LAB — CULTURE, BLOOD (ROUTINE X 2)
Culture: NO GROWTH
Special Requests: ADEQUATE

## 2024-07-02 LAB — RENAL FUNCTION PANEL
Albumin: 2.6 g/dL — ABNORMAL LOW (ref 3.5–5.0)
Anion gap: 9 (ref 5–15)
BUN: 28 mg/dL — ABNORMAL HIGH (ref 6–20)
CO2: 34 mmol/L — ABNORMAL HIGH (ref 22–32)
Calcium: 8.4 mg/dL — ABNORMAL LOW (ref 8.9–10.3)
Chloride: 107 mmol/L (ref 98–111)
Creatinine, Ser: 0.59 mg/dL (ref 0.44–1.00)
GFR, Estimated: >60 mL/min
Glucose, Bld: 278 mg/dL — ABNORMAL HIGH (ref 70–99)
Phosphorus: 3.2 mg/dL (ref 2.5–4.6)
Potassium: 3.9 mmol/L (ref 3.5–5.1)
Sodium: 150 mmol/L — ABNORMAL HIGH (ref 135–145)

## 2024-07-02 LAB — MAGNESIUM: Magnesium: 1.6 mg/dL — ABNORMAL LOW (ref 1.7–2.4)

## 2024-07-02 MED ORDER — MAGNESIUM SULFATE 2 GM/50ML IV SOLN
2.0000 g | Freq: Once | INTRAVENOUS | Status: AC
  Administered 2024-07-02: 2 g via INTRAVENOUS
  Filled 2024-07-02: qty 50

## 2024-07-02 MED ORDER — INSULIN ASPART 100 UNIT/ML IJ SOLN
0.0000 [IU] | INTRAMUSCULAR | Status: DC
  Administered 2024-07-02 (×2): 7 [IU] via SUBCUTANEOUS
  Administered 2024-07-02: 4 [IU] via SUBCUTANEOUS
  Administered 2024-07-02 (×2): 11 [IU] via SUBCUTANEOUS
  Administered 2024-07-03: 7 [IU] via SUBCUTANEOUS
  Administered 2024-07-03: 15 [IU] via SUBCUTANEOUS
  Administered 2024-07-03: 11 [IU] via SUBCUTANEOUS
  Administered 2024-07-03: 15 [IU] via SUBCUTANEOUS
  Administered 2024-07-03: 11 [IU] via SUBCUTANEOUS
  Administered 2024-07-03: 7 [IU] via SUBCUTANEOUS
  Administered 2024-07-04: 4 [IU] via SUBCUTANEOUS
  Administered 2024-07-04: 7 [IU] via SUBCUTANEOUS
  Administered 2024-07-04 (×2): 4 [IU] via SUBCUTANEOUS
  Administered 2024-07-04: 7 [IU] via SUBCUTANEOUS
  Administered 2024-07-05: 4 [IU] via SUBCUTANEOUS
  Administered 2024-07-05: 3 [IU] via SUBCUTANEOUS
  Administered 2024-07-05 (×2): 4 [IU] via SUBCUTANEOUS
  Administered 2024-07-05: 3 [IU] via SUBCUTANEOUS
  Administered 2024-07-06: 4 [IU] via SUBCUTANEOUS
  Administered 2024-07-06: 3 [IU] via SUBCUTANEOUS
  Administered 2024-07-06: 11 [IU] via SUBCUTANEOUS
  Administered 2024-07-06: 4 [IU] via SUBCUTANEOUS
  Administered 2024-07-06: 3 [IU] via SUBCUTANEOUS
  Administered 2024-07-07 (×3): 4 [IU] via SUBCUTANEOUS
  Administered 2024-07-07: 3 [IU] via SUBCUTANEOUS
  Administered 2024-07-08 (×2): 4 [IU] via SUBCUTANEOUS
  Administered 2024-07-08: 3 [IU] via SUBCUTANEOUS
  Filled 2024-07-02: qty 7
  Filled 2024-07-02: qty 4
  Filled 2024-07-02: qty 11
  Filled 2024-07-02: qty 15
  Filled 2024-07-02: qty 5
  Filled 2024-07-02: qty 11
  Filled 2024-07-02: qty 4
  Filled 2024-07-02: qty 7
  Filled 2024-07-02: qty 4
  Filled 2024-07-02: qty 1
  Filled 2024-07-02: qty 4
  Filled 2024-07-02: qty 7
  Filled 2024-07-02: qty 4
  Filled 2024-07-02: qty 7
  Filled 2024-07-02 (×3): qty 4
  Filled 2024-07-02: qty 11
  Filled 2024-07-02: qty 4
  Filled 2024-07-02: qty 11
  Filled 2024-07-02: qty 7
  Filled 2024-07-02: qty 1
  Filled 2024-07-02: qty 4
  Filled 2024-07-02: qty 1
  Filled 2024-07-02: qty 3
  Filled 2024-07-02: qty 15
  Filled 2024-07-02: qty 11
  Filled 2024-07-02 (×2): qty 3
  Filled 2024-07-02: qty 4
  Filled 2024-07-02: qty 3
  Filled 2024-07-02: qty 4
  Filled 2024-07-02: qty 7
  Filled 2024-07-02 (×2): qty 4
  Filled 2024-07-02 (×2): qty 3

## 2024-07-02 MED ORDER — HYDROMORPHONE HCL-NACL 50-0.9 MG/50ML-% IV SOLN
0.5000 mg/h | INTRAVENOUS | Status: DC
  Administered 2024-07-02: 8 mg/h via INTRAVENOUS
  Filled 2024-07-02 (×3): qty 50

## 2024-07-02 MED ORDER — HYDROMORPHONE HCL-NACL 50-0.9 MG/50ML-% IV SOLN: 0.5000 mg/h | INTRAVENOUS | Status: DC

## 2024-07-02 MED ORDER — SODIUM CHLORIDE 0.9 % IV SOLN
0.5000 mg/h | INTRAVENOUS | Status: DC
  Administered 2024-07-02 – 2024-07-03 (×2): 8 mg/h via INTRAVENOUS
  Filled 2024-07-02: qty 5

## 2024-07-02 NOTE — Plan of Care (Signed)
" °  Problem: Nutrition: Goal: Adequate nutrition will be maintained Outcome: Progressing   Problem: Elimination: Goal: Will not experience complications related to bowel motility Outcome: Progressing Goal: Will not experience complications related to urinary retention Outcome: Progressing   Problem: Pain Managment: Goal: General experience of comfort will improve and/or be controlled Outcome: Progressing   Problem: Safety: Goal: Ability to remain free from injury will improve Outcome: Progressing   Problem: Skin Integrity: Goal: Risk for impaired skin integrity will decrease Outcome: Progressing   Problem: Education: Goal: Knowledge of General Education information will improve Description: Including pain rating scale, medication(s)/side effects and non-pharmacologic comfort measures Outcome: Not Progressing   Problem: Activity: Goal: Risk for activity intolerance will decrease Outcome: Not Progressing   "

## 2024-07-02 NOTE — Progress Notes (Signed)
 PHARMACY CONSULT NOTE  Pharmacy Consult for Electrolyte Monitoring and Replacement   Recent Labs: Potassium (mmol/L)  Date Value  07/02/2024 3.9  10/05/2013 3.5   Magnesium  (mg/dL)  Date Value  87/80/7974 1.6 (L)  01/29/2013 2.1   Calcium  (mg/dL)  Date Value  87/80/7974 8.4 (L)   Calcium , Total (mg/dL)  Date Value  96/75/7984 9.1   Albumin (g/dL)  Date Value  87/80/7974 2.6 (L)  10/05/2013 4.3   Phosphorus (mg/dL)  Date Value  87/80/7974 3.2  07/21/2012 2.1 (L)   Sodium (mmol/L)  Date Value  07/02/2024 150 (H)  10/05/2013 135 (L)    Assessment: 43 y.o. female with medical history significant of essential hypertension, seizure disorder, polysubstance abuse, recent use of fentanyl  and heroin, who presents to the hospital with progressively worsening short of breath, hypoxia.  Pharmacy is asked to follow and replace electrolytes while in CCU  Nutrition: Vital AF at 50 mL/hr + FWF 200 mL every 4 hours  Goal of Therapy:  Electrolytes WNL  Plan:  --Na 150, improved from 157 yesterday. Continue current therapy as above plus continue D5w until 1644 today --Mg 1.6, magnesium  sulfate 2 g IV x 1 --Follow-up electrolytes with AM labs tomorrow  Marolyn KATHEE Mare 07/02/2024 9:36 AM

## 2024-07-02 NOTE — Inpatient Diabetes Management (Signed)
 Inpatient Diabetes Program Recommendations  AACE/ADA: New Consensus Statement on Inpatient Glycemic Control Target Ranges:  Prepandial:   less than 140 mg/dL      Peak postprandial:   less than 180 mg/dL (1-2 hours)      Critically ill patients:  140 - 180 mg/dL    Latest Reference Range & Units 07/01/24 03:20 07/01/24 08:12 07/01/24 11:35 07/01/24 15:33 07/01/24 19:55 07/01/24 23:15 07/02/24 03:34 07/02/24 07:48  Glucose-Capillary 70 - 99 mg/dL 842 (H) 865 (H) 821 (H) 269 (H) 135 (H) 237 (H) 283 (H) 183 (H)   Review of Glycemic Control  Diabetes history: No Outpatient Diabetes medications: NA Current orders for Inpatient glycemic control: Novolog  0-20 units Q4H; Solumedrol 40 mg Q12H, Vital @ 50 ml/hr  Inpatient Diabetes Program Recommendations:    Insulin : If steroids are continued, please consider ordering Lantus 7 units Q24H. If tube feedings are continued, please consider ordering Novolog  3 units Q4H for tube feeding coverage. If tube feeding is stopped or held then Novolog  tube feeding coverage should also be stopped or held.  Thanks, Earnie Gainer, RN, MSN, CDCES Diabetes Coordinator Inpatient Diabetes Program [phone] (Team Pager from 8am to 5pm)

## 2024-07-02 NOTE — Progress Notes (Signed)
 "  NAME:  ELLENORE ROSCOE, MRN:  [id number], DOB:  Aug 20, 1980, LOS: 5 ADMISSION DATE:  06/27/2024  CHIEF COMPLAINT:  severe resp failure, COCAINE ABUSE AND STREP PNEUMONIA   History of Present Illness:   43 year old female with history of polysubstance use (IVDU with fentanyl , last injection 4 days ago) who presents with increased shortness of breath and admitted for management of pneumonia. +COCAINE  Patient with worsening respiratory status with increased work of breathing, worsening hypoxia, and increased tachycardia. With her very severe pneumonia we would need to escalate therapy further with intubation, mechanical ventilation, and consideration for bronchoscopy to clear airway secretions. Discussed with the patient and her husband at bedside and she consents to proceed.     Significant Hospital Events: Including procedures, antibiotic start and stop dates in addition to other pertinent events   06/27/24: admit with right sided pneumonia and respiratory failure. CXR with right lung white out, INTUBATED, S/p BRONCH, ART LINE PLACED 12/14 BLOOD CX +STREP PNEUMONIA 12/15 remains on vent 12/16 remains on vent 12/17 remains on vent, severe hypoxia, CVL placed 12/18 severe Hypoxia s/p BRONCH mucoid secretions, worsening CXR, ABX broadened to cover Pseudomonas and MRSA 12/19 severe hypoxia      Micro Data:  12/14 BLOOD CX + STREP PNEUMONIA  Antimicrobials:   Antibiotics Given (last 72 hours)     Date/Time Action Medication Dose Rate   06/29/24 1403 New Bag/Given   azithromycin  (ZITHROMAX ) 500 mg in sodium chloride  0.9 % 250 mL IVPB 500 mg 250 mL/hr   06/29/24 1507 New Bag/Given   cefTRIAXone  (ROCEPHIN ) 2 g in sodium chloride  0.9 % 100 mL IVPB 2 g 200 mL/hr   06/30/24 1624 New Bag/Given   azithromycin  (ZITHROMAX ) 500 mg in sodium chloride  0.9 % 250 mL IVPB 500 mg 250 mL/hr   06/30/24 1753 New Bag/Given   cefTRIAXone  (ROCEPHIN ) 2 g in sodium chloride  0.9 % 100 mL IVPB 2 g 200  mL/hr   07/01/24 1418 New Bag/Given   azithromycin  (ZITHROMAX ) 500 mg in sodium chloride  0.9 % 250 mL IVPB 500 mg 250 mL/hr   07/01/24 1458 New Bag/Given   linezolid  (ZYVOX ) IVPB 600 mg 600 mg 300 mL/hr   07/01/24 1601 New Bag/Given   ceFEPIme  (MAXIPIME ) 2 g in sodium chloride  0.9 % 100 mL IVPB 2 g 200 mL/hr   07/01/24 2141 New Bag/Given   linezolid  (ZYVOX ) IVPB 600 mg 600 mg 300 mL/hr   07/01/24 2322 New Bag/Given   ceFEPIme  (MAXIPIME ) 2 g in sodium chloride  0.9 % 100 mL IVPB 2 g 200 mL/hr            Interim History / Subjective:  Remains critically ill Remains intubated Severe hypoxia Requires VENT support for survival    Vent Mode: PRVC FiO2 (%):  [50 %-80 %] 50 % Set Rate:  [28 bmp] 28 bmp Vt Set:  [390 mL] 390 mL PEEP:  [10 cmH20] 10 cmH20 Plateau Pressure:  [27 cmH20] 27 cmH20       Objective   Blood pressure (!) 163/68, pulse (!) 126, temperature 100.2 F (37.9 C), temperature source Oral, resp. rate 19, height 5' 4 (1.626 m), weight 73.5 kg, last menstrual period 06/08/2024, SpO2 92%.    Vent Mode: PRVC FiO2 (%):  [50 %-80 %] 50 % Set Rate:  [28 bmp] 28 bmp Vt Set:  [390 mL] 390 mL PEEP:  [10 cmH20] 10 cmH20 Plateau Pressure:  [27 cmH20] 27 cmH20   Intake/Output Summary (Last 24 hours)  at 07/02/2024 0735 Last data filed at 07/02/2024 0600 Gross per 24 hour  Intake 6987.83 ml  Output 4409 ml  Net 2578.83 ml   Filed Weights   06/27/24 1025 06/27/24 1127 06/27/24 1614  Weight: 73.5 kg 73.5 kg 73.5 kg     REVIEW OF SYSTEMS  PATIENT IS UNABLE TO PROVIDE COMPLETE REVIEW OF SYSTEMS DUE TO SEVERE CRITICAL ILLNESS   PHYSICAL EXAMINATION:  GENERAL:critically ill appearing, +resp distress EYES: Pupils equal, round, reactive to light.  No scleral icterus.  MOUTH: Moist mucosal membrane. INTUBATED NECK: Supple.  PULMONARY: Lungs clear to auscultation, +rhonchi, +wheezing CARDIOVASCULAR: S1 and S2.  Regular rate and rhythm GASTROINTESTINAL: Soft,  nontender, -distended. Positive bowel sounds.  MUSCULOSKELETAL: + edema.  NEUROLOGIC: obtunded,sedated SKIN:normal, warm to touch, Capillary refill delayed  Pulses present bilaterally   ASSESSMENT AND PLAN SYNOPSIS  43 yo white female with severe ALI from acute severe bacterial pneumonia STREP Pneumonia with bacteremia leading to severe hypoxic resp failure metabolic encephalopathy, leading to emergent intubation  Severe ACUTE Hypoxic and Hypercapnic Respiratory Failure -continue Mechanical Ventilator support -Wean Fio2 and PEEP as tolerated -VAP/VENT bundle implementation - Wean PEEP & FiO2 as tolerated, maintain SpO2 > 88% - Head of bed elevated 30 degrees, VAP protocol in place - Plateau pressures less than 30 cm H20  - Intermittent chest x-ray & ABG PRN - Ensure adequate pulmonary hygiene  Unable to wean from vent-severe hypoxia    Vent Mode: PRVC FiO2 (%):  [50 %-80 %] 50 % Set Rate:  [28 bmp] 28 bmp Vt Set:  [390 mL] 390 mL PEEP:  [10 cmH20] 10 cmH20 Plateau Pressure:  [27 cmH20] 27 cmH20   CARDIAC -oxygen as needed -follow up cardiac enzymes as indicated   CARDIAC ICU monitoring  RENAL -continue Foley Catheter-assess need -Avoid nephrotoxic agents -Follow urine output, BMP -Ensure adequate renal perfusion, optimize oxygenation -Renal dose medications   Intake/Output Summary (Last 24 hours) at 07/02/2024 0737 Last data filed at 07/02/2024 0600 Gross per 24 hour  Intake 6987.83 ml  Output 4409 ml  Net 2578.83 ml     NEUROLOGY ACUTE METABOLIC ENCEPHALOPATHY Concerns for withdrawal, started clonidine  -need for sedation -Goal RASS -2 to -3    SEPTIC shock SOURCE-strep pneumonia -use vasopressors to keep MAP>65 as needed -follow ABG and LA as needed Repeat blood and BAL  Cultures pending    INFECTIOUS DISEASE -continue antibiotics as prescribed -follow up cultures   ENDO - ICU hypoglycemic\Hyperglycemia protocol -check FSBS per  protocol   GI GI PROPHYLAXIS as indicated NUTRITIONAL STATUS DIET-->TF's as tolerated Constipation protocol as indicated   ELECTROLYTES -follow labs as needed -replace as needed -pharmacy consultation and following  RESTRICTIVE TRANSFUSION PROTOCOL TRANSFUSION  IF HGB<7  or ACTIVE BLEEDING OR DX of ACUTE CORONARY SYNDROMES      Best practice (right click and Reselect all SmartList Selections daily)  Diet:  NPO Pain/Anxiety/Delirium protocol (if indicated): Yes (RASS goal -2) VAP protocol (if indicated): Yes DVT prophylaxis: Subcutaneous Heparin  GI prophylaxis: H2B Central venous access:  N/A Arterial line:  Yes, and it is still needed Foley:  Yes, and it is still needed Mobility:  bed rest  Code Status:  FULL CODE Disposition: ICU  Labs   CBC: Recent Labs  Lab 06/27/24 1042 06/28/24 0338 06/29/24 0505 06/30/24 0346 07/01/24 0425 07/02/24 0500  WBC 20.8* 35.4* 32.6* 28.3* 20.8* 19.9*  NEUTROABS 18.7*  --   --   --   --   --   HGB 11.3*  10.0* 8.7* 9.2* 8.2* 7.5*  HCT 33.5* 29.5* 25.0* 28.4* 25.9* 24.6*  MCV 89.6 88.3 87.1 91.6 94.5 96.5  PLT 399 438* 367 305 267 217    Basic Metabolic Panel: Recent Labs  Lab 06/28/24 0338 06/29/24 0505 06/29/24 2204 06/30/24 0346 06/30/24 2127 07/01/24 0425 07/01/24 1442 07/01/24 2109 07/02/24 0500  NA 136 142  --  149*  --  157* 157* 156* 150*  K 3.5 2.9* 4.2 3.8 4.0 4.1  --   --  3.9  CL 100 105  --  110  --  114*  --   --  107  CO2 22 26  --  30  --  36*  --   --  34*  GLUCOSE 159* 168*  --  242*  --  173*  --   --  278*  BUN 50* 36*  --  30*  --  35*  --   --  28*  CREATININE 1.24* 0.81  --  0.63  --  0.59  --   --  0.59  CALCIUM  8.3* 8.6*  --  9.0  --  8.7*  --   --  8.4*  MG 1.9 2.3  --  2.3 1.7 1.6*  --  1.7 1.6*  PHOS 6.8* 4.1  --  3.1 1.5* 2.7  --   --  3.2   GFR: Estimated Creatinine Clearance: 87.2 mL/min (by C-G formula based on SCr of 0.59 mg/dL). Recent Labs  Lab 06/27/24 1042  06/28/24 0338 06/28/24 0605 06/29/24 0505 06/30/24 0346 07/01/24 0425 07/02/24 0500  WBC 20.8*   < >  --  32.6* 28.3* 20.8* 19.9*  LATICACIDVEN 2.4*  --  1.3  --   --   --   --    < > = values in this interval not displayed.    Liver Function Tests: Recent Labs  Lab 06/29/24 0505 06/30/24 0346 07/01/24 0425 07/02/24 0500  ALBUMIN 2.2* 2.4* 2.4* 2.6*   No results for input(s): LIPASE, AMYLASE in the last 168 hours. No results for input(s): AMMONIA in the last 168 hours.  ABG    Component Value Date/Time   PHART 7.45 07/01/2024 0425   PCO2ART 60 (H) 07/01/2024 0425   PO2ART 110 (H) 07/01/2024 0425   HCO3 41.7 (H) 07/01/2024 0425   ACIDBASEDEF 3.2 (H) 06/28/2024 0544   O2SAT 99.5 07/01/2024 0425     Coagulation Profile: No results for input(s): INR, PROTIME in the last 168 hours.  Cardiac Enzymes: No results for input(s): CKTOTAL, CKMB, CKMBINDEX, TROPONINI in the last 168 hours.  HbA1C: Hemoglobin A1C  Date/Time Value Ref Range Status  07/21/2012 04:48 AM 4.8 4.2 - 6.3 % Final    Comment:    The American Diabetes Association recommends that a primary goal of therapy should be <7% and that physicians should reevaluate the treatment regimen in patients with HbA1c values consistently >8%.    Hgb A1c MFr Bld  Date/Time Value Ref Range Status  06/29/2024 05:04 AM 6.0 (H) 4.8 - 5.6 % Final    Comment:    (NOTE) Diagnosis of Diabetes The following HbA1c ranges recommended by the American Diabetes Association (ADA) may be used as an aid in the diagnosis of diabetes mellitus.  Hemoglobin             Suggested A1C NGSP%              Diagnosis  <5.7  Non Diabetic  5.7-6.4                Pre-Diabetic  >6.4                   Diabetic  <7.0                   Glycemic control for                       adults with diabetes.      CBG: Recent Labs  Lab 07/01/24 1135 07/01/24 1533 07/01/24 1955 07/01/24 2315  07/02/24 0334  GLUCAP 178* 269* 135* 237* 283*    Allergies Allergies[1]      DVT/GI PRX  assessed I Assessed the need for Labs I Assessed the need for Foley I Assessed the need for Central Venous Line Family Discussion when available I Assessed the need for Mobilization I made an Assessment of medications to be adjusted accordingly Safety Risk assessment completed  CASE DISCUSSED IN MULTIDISCIPLINARY ROUNDS WITH ICU TEAM     Critical Care Time devoted to patient care services described in this note is 52 minutes.  Critical care was necessary to treat /prevent imminent and life-threatening deterioration. Overall, patient is critically ill, prognosis is guarded.  Patient with Multiorgan failure and at high risk for cardiac arrest and death.    Nickolas Alm Cellar, M.D.  [facility]  Medical Director [facility]                   [1]  Allergies Allergen Reactions   Varenicline  Tartrate Other (See Comments)    varenicline    Wellbutrin  [Bupropion ]     Suicidal thoughts    "

## 2024-07-03 ENCOUNTER — Inpatient Hospital Stay

## 2024-07-03 DIAGNOSIS — J154 Pneumonia due to other streptococci: Secondary | ICD-10-CM | POA: Diagnosis not present

## 2024-07-03 DIAGNOSIS — J9601 Acute respiratory failure with hypoxia: Secondary | ICD-10-CM | POA: Diagnosis not present

## 2024-07-03 DIAGNOSIS — G9341 Metabolic encephalopathy: Secondary | ICD-10-CM | POA: Diagnosis not present

## 2024-07-03 DIAGNOSIS — J9602 Acute respiratory failure with hypercapnia: Secondary | ICD-10-CM | POA: Diagnosis not present

## 2024-07-03 LAB — CBC
HCT: 27.8 % — ABNORMAL LOW (ref 36.0–46.0)
Hemoglobin: 8.8 g/dL — ABNORMAL LOW (ref 12.0–15.0)
MCH: 30 pg (ref 26.0–34.0)
MCHC: 31.7 g/dL (ref 30.0–36.0)
MCV: 94.9 fL (ref 80.0–100.0)
Platelets: 218 K/uL (ref 150–400)
RBC: 2.93 MIL/uL — ABNORMAL LOW (ref 3.87–5.11)
RDW: 14.1 % (ref 11.5–15.5)
WBC: 18.5 K/uL — ABNORMAL HIGH (ref 4.0–10.5)
nRBC: 0 % (ref 0.0–0.2)

## 2024-07-03 LAB — GLUCOSE, CAPILLARY
Glucose-Capillary: 209 mg/dL — ABNORMAL HIGH (ref 70–99)
Glucose-Capillary: 249 mg/dL — ABNORMAL HIGH (ref 70–99)
Glucose-Capillary: 241 mg/dL — ABNORMAL HIGH (ref 70–99)
Glucose-Capillary: 267 mg/dL — ABNORMAL HIGH (ref 70–99)
Glucose-Capillary: 313 mg/dL — ABNORMAL HIGH (ref 70–99)
Glucose-Capillary: 344 mg/dL — ABNORMAL HIGH (ref 70–99)

## 2024-07-03 LAB — CULTURE, RESPIRATORY W GRAM STAIN

## 2024-07-03 LAB — RENAL FUNCTION PANEL
Albumin: 2.7 g/dL — ABNORMAL LOW (ref 3.5–5.0)
Anion gap: 7 (ref 5–15)
BUN: 25 mg/dL — ABNORMAL HIGH (ref 6–20)
CO2: 32 mmol/L (ref 22–32)
Calcium: 8.9 mg/dL (ref 8.9–10.3)
Chloride: 103 mmol/L (ref 98–111)
Creatinine, Ser: 0.39 mg/dL — ABNORMAL LOW (ref 0.44–1.00)
GFR, Estimated: >60 mL/min
Glucose, Bld: 269 mg/dL — ABNORMAL HIGH (ref 70–99)
Phosphorus: 3.6 mg/dL (ref 2.5–4.6)
Potassium: 4.2 mmol/L (ref 3.5–5.1)
Sodium: 143 mmol/L (ref 135–145)

## 2024-07-03 LAB — MAGNESIUM: Magnesium: 1.7 mg/dL (ref 1.7–2.4)

## 2024-07-03 MED ORDER — ACETAZOLAMIDE SODIUM 500 MG IJ SOLR
500.0000 mg | Freq: Once | INTRAMUSCULAR | Status: AC
  Administered 2024-07-03: 500 mg via INTRAVENOUS
  Filled 2024-07-03: qty 500

## 2024-07-03 MED ORDER — NOREPINEPHRINE 4 MG/250ML-% IV SOLN
INTRAVENOUS | Status: AC
  Administered 2024-07-03: 4 ug/min via INTRAVENOUS
  Filled 2024-07-03: qty 250

## 2024-07-03 MED ORDER — MAGNESIUM SULFATE 2 GM/50ML IV SOLN
2.0000 g | Freq: Once | INTRAVENOUS | Status: AC
  Administered 2024-07-03: 2 g via INTRAVENOUS
  Filled 2024-07-03: qty 50

## 2024-07-03 MED ORDER — INSULIN GLARGINE 100 UNIT/ML ~~LOC~~ SOLN
10.0000 [IU] | Freq: Two times a day (BID) | SUBCUTANEOUS | Status: DC
  Administered 2024-07-03 – 2024-07-04 (×3): 10 [IU] via SUBCUTANEOUS
  Filled 2024-07-03 (×4): qty 0.1

## 2024-07-03 MED ORDER — SODIUM CHLORIDE 0.9 % IV SOLN
0.5000 mg/h | INTRAVENOUS | Status: DC
  Administered 2024-07-03 – 2024-07-04 (×6): 8 mg/h via INTRAVENOUS
  Administered 2024-07-05: 7.5 mg/h via INTRAVENOUS
  Administered 2024-07-05 – 2024-07-06 (×5): 8 mg/h via INTRAVENOUS
  Filled 2024-07-03 (×13): qty 5

## 2024-07-03 MED ORDER — NOREPINEPHRINE 4 MG/250ML-% IV SOLN
0.0000 ug/min | INTRAVENOUS | Status: DC
  Administered 2024-07-05: 5 ug/min via INTRAVENOUS
  Administered 2024-07-07: 4 ug/min via INTRAVENOUS
  Filled 2024-07-03 (×3): qty 250

## 2024-07-03 NOTE — Progress Notes (Signed)
 PHARMACY CONSULT NOTE  Pharmacy Consult for Electrolyte Monitoring and Replacement   Recent Labs: Potassium (mmol/L)  Date Value  07/03/2024 4.2  10/05/2013 3.5   Magnesium  (mg/dL)  Date Value  87/79/7974 1.7  01/29/2013 2.1   Calcium  (mg/dL)  Date Value  87/79/7974 8.9   Calcium , Total (mg/dL)  Date Value  96/75/7984 9.1   Albumin (g/dL)  Date Value  87/79/7974 2.7 (L)  10/05/2013 4.3   Phosphorus (mg/dL)  Date Value  87/79/7974 3.6  07/21/2012 2.1 (L)   Sodium (mmol/L)  Date Value  07/03/2024 143  10/05/2013 135 (L)    Assessment: 43 y.o. female with medical history significant of essential hypertension, seizure disorder, polysubstance abuse, recent use of fentanyl  and heroin, who presents to the hospital with progressively worsening short of breath, hypoxia.  Pharmacy is asked to follow and replace electrolytes while in CCU  Nutrition: Vital AF at 50 mL/hr + FWF 200 mL every 4 hours  Goal of Therapy:  Electrolytes WNL  Plan:  --magnesium  sulfate 2 g IV x 1 --Follow-up electrolytes with AM labs tomorrow  [name] Phillips 07/03/2024 7:35 AM

## 2024-07-03 NOTE — Progress Notes (Signed)
 "  NAME:  [name] Phillips, MRN:  [id number], DOB:  13-Dec-1980, LOS: 6 ADMISSION DATE:  06/27/2024  CHIEF COMPLAINT:  severe resp failure, COCAINE ABUSE AND STREP PNEUMONIA   History of Present Illness:   43 year old female with history of polysubstance use (IVDU with fentanyl , last injection 4 days ago) who presents with increased shortness of breath and admitted for management of pneumonia. +COCAINE  Patient with worsening respiratory status with increased work of breathing, worsening hypoxia, and increased tachycardia. With her very severe pneumonia we would need to escalate therapy further with intubation, mechanical ventilation, and consideration for bronchoscopy to clear airway secretions. Discussed with the patient and her husband at bedside and she consents to proceed.     Significant Hospital Events: Including procedures, antibiotic start and stop dates in addition to other pertinent events   06/27/24: admit with right sided pneumonia and respiratory failure. CXR with right lung white out, INTUBATED, S/p BRONCH, ART LINE PLACED 12/14 BLOOD CX +STREP PNEUMONIA 12/15 remains on vent 12/16 remains on vent 12/17 remains on vent, severe hypoxia, CVL placed, husband updated 12/18 severe Hypoxia s/p BRONCH mucoid secretions, worsening CXR, ABX broadened to cover Pseudomonas and MRSA, husband updated 12/19 severe hypoxia, husband updated 12/20 severe hypoxia, remains on vent      Micro Data:  12/14 BLOOD CX + STREP PNEUMONIA  Antimicrobials:   Antibiotics Given (last 72 hours)     Date/Time Action Medication Dose Rate   06/30/24 1624 New Bag/Given   azithromycin  (ZITHROMAX ) 500 mg in sodium chloride  0.9 % 250 mL IVPB 500 mg 250 mL/hr   06/30/24 1753 New Bag/Given   cefTRIAXone  (ROCEPHIN ) 2 g in sodium chloride  0.9 % 100 mL IVPB 2 g 200 mL/hr   07/01/24 1418 New Bag/Given   azithromycin  (ZITHROMAX ) 500 mg in sodium chloride  0.9 % 250 mL IVPB 500 mg 250 mL/hr   07/01/24 1458  New Bag/Given   linezolid  (ZYVOX ) IVPB 600 mg 600 mg 300 mL/hr   07/01/24 1601 New Bag/Given   ceFEPIme  (MAXIPIME ) 2 g in sodium chloride  0.9 % 100 mL IVPB 2 g 200 mL/hr   07/01/24 2141 New Bag/Given   linezolid  (ZYVOX ) IVPB 600 mg 600 mg 300 mL/hr   07/01/24 2322 New Bag/Given   ceFEPIme  (MAXIPIME ) 2 g in sodium chloride  0.9 % 100 mL IVPB 2 g 200 mL/hr   07/02/24 0910 New Bag/Given   ceFEPIme  (MAXIPIME ) 2 g in sodium chloride  0.9 % 100 mL IVPB 2 g 200 mL/hr   07/02/24 1234 New Bag/Given   linezolid  (ZYVOX ) IVPB 600 mg 600 mg 300 mL/hr   07/02/24 1550 New Bag/Given   azithromycin  (ZITHROMAX ) 500 mg in sodium chloride  0.9 % 250 mL IVPB 500 mg 250 mL/hr   07/02/24 1603 New Bag/Given   ceFEPIme  (MAXIPIME ) 2 g in sodium chloride  0.9 % 100 mL IVPB 2 g 200 mL/hr   07/02/24 2122 New Bag/Given   linezolid  (ZYVOX ) IVPB 600 mg 600 mg 300 mL/hr   07/03/24 0016 New Bag/Given   ceFEPIme  (MAXIPIME ) 2 g in sodium chloride  0.9 % 100 mL IVPB 2 g 200 mL/hr       Interim History / Subjective:  Remains critically ill Remains intubated Severe hypoxia Requires VENT support for survival   Vent Mode: PRVC FiO2 (%):  [50 %-60 %] 60 % Set Rate:  [28 bmp] 28 bmp Vt Set:  [390 mL] 390 mL PEEP:  [5 cmH20-10 cmH20] 10 cmH20 Plateau Pressure:  [20 cmH20-23 cmH20] 23  cmH20       Objective   Blood pressure (!) 197/98, pulse 84, temperature 97.8 F (36.6 C), temperature source Axillary, resp. rate (!) 25, height 5' 4 (1.626 m), weight 73.5 kg, last menstrual period 06/08/2024, SpO2 94%.    Vent Mode: PRVC FiO2 (%):  [50 %-60 %] 60 % Set Rate:  [28 bmp] 28 bmp Vt Set:  [390 mL] 390 mL PEEP:  [5 cmH20-10 cmH20] 10 cmH20 Plateau Pressure:  [20 cmH20-23 cmH20] 23 cmH20   Intake/Output Summary (Last 24 hours) at 07/03/2024 0744 Last data filed at 07/03/2024 0600 Gross per 24 hour  Intake 5746.27 ml  Output 3475 ml  Net 2271.27 ml   Filed Weights   06/27/24 1025 06/27/24 1127 06/27/24 1614   Weight: 73.5 kg 73.5 kg 73.5 kg      REVIEW OF SYSTEMS  PATIENT IS UNABLE TO PROVIDE COMPLETE REVIEW OF SYSTEMS DUE TO SEVERE CRITICAL ILLNESS   PHYSICAL EXAMINATION:  GENERAL:critically ill appearing EYES: Pupils equal, round, reactive to light.  No scleral icterus.  MOUTH: Moist mucosal membrane. INTUBATED NECK: Supple.  PULMONARY: Lungs clear to auscultation, +rhonchi CARDIOVASCULAR: S1 and S2.  Regular rate and rhythm GASTROINTESTINAL: Soft, nontender, -distended. Positive bowel sounds.  MUSCULOSKELETAL: edema.  NEUROLOGIC: obtunded,sedated SKIN:normal, warm to touch, Capillary refill delayed  Pulses present bilaterally    ASSESSMENT AND PLAN SYNOPSIS  43 yo white female with severe ALI from acute severe bacterial pneumonia STREP Pneumonia with bacteremia leading to severe hypoxic resp failure metabolic encephalopathy, leading to emergent intubation  Severe ACUTE Hypoxic and Hypercapnic Respiratory Failure -continue Mechanical Ventilator support -Wean Fio2 and PEEP as tolerated -VAP/VENT bundle implementation - Wean PEEP & FiO2 as tolerated, maintain SpO2 > 88% - Head of bed elevated 30 degrees, VAP protocol in place - Plateau pressures less than 30 cm H20  - Intermittent chest x-ray & ABG PRN - Ensure adequate pulmonary hygiene  Unable to wean from vent-severe hypoxia    Vent Mode: PRVC FiO2 (%):  [50 %-60 %] 60 % Set Rate:  [28 bmp] 28 bmp Vt Set:  [390 mL] 390 mL PEEP:  [5 cmH20-10 cmH20] 10 cmH20 Plateau Pressure:  [20 cmH20-23 cmH20] 23 cmH20   CARDIAC -oxygen as needed -follow up cardiac enzymes as indicated   NEUROLOGY ACUTE METABOLIC ENCEPHALOPATHY -need for sedation -Goal RASS -2 to -3 Concerns for withdrawal, started clonidine     SEPTIC shock SOURCE-strep pneumonia -use vasopressors to keep MAP>65 as needed -follow ABG and LA as needed Repeat blood and BAL  Cultures pending   INFECTIOUS DISEASE -continue antibiotics as  prescribed -follow up cultures STREP PNEUMONIA BACTEREMIA-REPEAT BLOOD CULTURES NG BAL cultures-NGTD    ENDO - ICU hypoglycemic\Hyperglycemia protocol -check FSBS per protocol   GI GI PROPHYLAXIS as indicated NUTRITIONAL STATUS DIET-->TF's as tolerated Constipation protocol as indicated   ELECTROLYTES -follow labs as needed -replace as needed -pharmacy consultation and following  RESTRICTIVE TRANSFUSION PROTOCOL TRANSFUSION  IF HGB<7  or ACTIVE BLEEDING OR DX of ACUTE CORONARY SYNDROMES         Best practice (right click and Reselect all SmartList Selections daily)  Diet:  NPO Pain/Anxiety/Delirium protocol (if indicated): Yes (RASS goal -2) VAP protocol (if indicated): Yes DVT prophylaxis: Subcutaneous Heparin  GI prophylaxis: H2B Central venous access:  N/A Arterial line:  Yes, and it is still needed Foley:  Yes, and it is still needed Mobility:  bed rest  Code Status:  FULL CODE Disposition: ICU  Labs   CBC: Recent Labs  Lab 06/27/24 1042 06/28/24 0338 06/29/24 0505 06/30/24 0346 07/01/24 0425 07/02/24 0500 07/03/24 0502  WBC 20.8*   < > 32.6* 28.3* 20.8* 19.9* 18.5*  NEUTROABS 18.7*  --   --   --   --   --   --   HGB 11.3*   < > 8.7* 9.2* 8.2* 7.5* 8.8*  HCT 33.5*   < > 25.0* 28.4* 25.9* 24.6* 27.8*  MCV 89.6   < > 87.1 91.6 94.5 96.5 94.9  PLT 399   < > 367 305 267 217 218   < > = values in this interval not displayed.    Basic Metabolic Panel: Recent Labs  Lab 06/29/24 0505 06/29/24 2204 06/30/24 0346 06/30/24 2127 07/01/24 0425 07/01/24 1442 07/01/24 2109 07/02/24 0500 07/03/24 0502  NA 142  --  149*  --  157* 157* 156* 150* 143  K 2.9*   < > 3.8 4.0 4.1  --   --  3.9 4.2  CL 105  --  110  --  114*  --   --  107 103  CO2 26  --  30  --  36*  --   --  34* 32  GLUCOSE 168*  --  242*  --  173*  --   --  278* 269*  BUN 36*  --  30*  --  35*  --   --  28* 25*  CREATININE 0.81  --  0.63  --  0.59  --   --  0.59 0.39*  CALCIUM   8.6*  --  9.0  --  8.7*  --   --  8.4* 8.9  MG 2.3  --  2.3 1.7 1.6*  --  1.7 1.6* 1.7  PHOS 4.1  --  3.1 1.5* 2.7  --   --  3.2 3.6   < > = values in this interval not displayed.   GFR: Estimated Creatinine Clearance: 87.2 mL/min (A) (by C-G formula based on SCr of 0.39 mg/dL (L)). Recent Labs  Lab 06/27/24 1042 06/28/24 0338 06/28/24 0605 06/29/24 0505 06/30/24 0346 07/01/24 0425 07/02/24 0500 07/03/24 0502  WBC 20.8*   < >  --    < > 28.3* 20.8* 19.9* 18.5*  LATICACIDVEN 2.4*  --  1.3  --   --   --   --   --    < > = values in this interval not displayed.    Liver Function Tests: Recent Labs  Lab 06/29/24 0505 06/30/24 0346 07/01/24 0425 07/02/24 0500 07/03/24 0502  ALBUMIN 2.2* 2.4* 2.4* 2.6* 2.7*   No results for input(s): LIPASE, AMYLASE in the last 168 hours. No results for input(s): AMMONIA in the last 168 hours.  ABG    Component Value Date/Time   PHART 7.45 07/01/2024 0425   PCO2ART 60 (H) 07/01/2024 0425   PO2ART 110 (H) 07/01/2024 0425   HCO3 41.7 (H) 07/01/2024 0425   ACIDBASEDEF 3.2 (H) 06/28/2024 0544   O2SAT 99.5 07/01/2024 0425     Coagulation Profile: No results for input(s): INR, PROTIME in the last 168 hours.  Cardiac Enzymes: No results for input(s): CKTOTAL, CKMB, CKMBINDEX, TROPONINI in the last 168 hours.  HbA1C: Hemoglobin A1C  Date/Time Value Ref Range Status  07/21/2012 04:48 AM 4.8 4.2 - 6.3 % Final    Comment:    The American Diabetes Association recommends that a primary goal of therapy should be <7% and that physicians should reevaluate the treatment regimen in patients with HbA1c  values consistently >8%.    Hgb A1c MFr Bld  Date/Time Value Ref Range Status  06/29/2024 05:04 AM 6.0 (H) 4.8 - 5.6 % Final    Comment:    (NOTE) Diagnosis of Diabetes The following HbA1c ranges recommended by the American Diabetes Association (ADA) may be used as an aid in the diagnosis of diabetes  mellitus.  Hemoglobin             Suggested A1C NGSP%              Diagnosis  <5.7                   Non Diabetic  5.7-6.4                Pre-Diabetic  >6.4                   Diabetic  <7.0                   Glycemic control for                       adults with diabetes.      CBG: Recent Labs  Lab 07/02/24 1536 07/02/24 1912 07/02/24 2056 07/02/24 2321 07/03/24 0312  GLUCAP 256* 244* 221* 292* 209*    Allergies Allergies[1]      DVT/GI PRX  assessed I Assessed the need for Labs I Assessed the need for Foley I Assessed the need for Central Venous Line Family Discussion when available I Assessed the need for Mobilization I made an Assessment of medications to be adjusted accordingly Safety Risk assessment completed  CASE DISCUSSED IN MULTIDISCIPLINARY ROUNDS WITH ICU TEAM     Critical Care Time devoted to patient care services described in this note is 50 minutes.  Critical care was necessary to treat /prevent imminent and life-threatening deterioration. Overall, patient is critically ill, prognosis is guarded.     Nickolas Alm Cellar, M.D.  [facility]  Medical Director [facility] [place]                   [1]  Allergies Allergen Reactions   Varenicline  Tartrate Other (See Comments)    varenicline    Wellbutrin  [Bupropion ]     Suicidal thoughts    "

## 2024-07-03 NOTE — Plan of Care (Signed)
" °  Problem: Education: Goal: Knowledge of General Education information will improve Description: Including pain rating scale, medication(s)/side effects and non-pharmacologic comfort measures Outcome: Progressing   Problem: Health Behavior/Discharge Planning: Goal: Ability to manage health-related needs will improve Outcome: Progressing   Problem: Clinical Measurements: Goal: Ability to maintain clinical measurements within normal limits will improve Outcome: Progressing Goal: Will remain free from infection Outcome: Progressing Goal: Diagnostic test results will improve Outcome: Progressing Goal: Respiratory complications will improve Outcome: Progressing Goal: Cardiovascular complication will be avoided Outcome: Progressing   Problem: Activity: Goal: Risk for activity intolerance will decrease Outcome: Progressing   Problem: Nutrition: Goal: Adequate nutrition will be maintained Outcome: Progressing   Problem: Coping: Goal: Level of anxiety will decrease Outcome: Progressing   Problem: Elimination: Goal: Will not experience complications related to bowel motility Outcome: Progressing Goal: Will not experience complications related to urinary retention Outcome: Progressing   Problem: Pain Managment: Goal: General experience of comfort will improve and/or be controlled Outcome: Progressing   Problem: Safety: Goal: Ability to remain free from injury will improve Outcome: Progressing   Problem: Skin Integrity: Goal: Risk for impaired skin integrity will decrease Outcome: Progressing   Problem: Activity: Goal: Ability to tolerate increased activity will improve Outcome: Progressing   Problem: Clinical Measurements: Goal: Ability to maintain a body temperature in the normal range will improve Outcome: Progressing   Problem: Respiratory: Goal: Ability to maintain adequate ventilation will improve Outcome: Progressing Goal: Ability to maintain a clear airway  will improve Outcome: Progressing   Problem: Activity: Goal: Ability to tolerate increased activity will improve Outcome: Progressing   Problem: Respiratory: Goal: Ability to maintain a clear airway and adequate ventilation will improve Outcome: Progressing   Problem: Role Relationship: Goal: Method of communication will improve Outcome: Progressing   Problem: Education: Goal: Ability to describe self-care measures that may prevent or decrease complications (Diabetes Survival Skills Education) will improve Outcome: Progressing   Problem: Coping: Goal: Ability to adjust to condition or change in health will improve Outcome: Progressing   Problem: Fluid Volume: Goal: Ability to maintain a balanced intake and output will improve Outcome: Progressing   Problem: Health Behavior/Discharge Planning: Goal: Ability to identify and utilize available resources and services will improve Outcome: Progressing Goal: Ability to manage health-related needs will improve Outcome: Progressing   Problem: Metabolic: Goal: Ability to maintain appropriate glucose levels will improve Outcome: Progressing   Problem: Nutritional: Goal: Maintenance of adequate nutrition will improve Outcome: Progressing Goal: Progress toward achieving an optimal weight will improve Outcome: Progressing   Problem: Skin Integrity: Goal: Risk for impaired skin integrity will decrease Outcome: Progressing   Problem: Tissue Perfusion: Goal: Adequacy of tissue perfusion will improve Outcome: Progressing   "

## 2024-07-03 NOTE — Progress Notes (Signed)
 Vent check was completed at 0818 this AM on the documented settings with no changes being made to the ventilator..  Mouth care was completed suctioning for a copious amount of thick pale yellow secretions.  ETT was moved to the pt's anatomical right side.  ETT was suctioned for a scant amount of secretions.  Bag and mask is at [facility] and is plugged into the 02 flowmeter.

## 2024-07-03 NOTE — Progress Notes (Signed)
 RN called stating pt has been dropping her Sp02.  I placed a new pulse ox probe on patient, however, Sp02 was still 85-86%.  I increased 02 to 90% and her peep to 12.  Pt is in no respiratory distress at the current time will continue to monitor through out the remainder of the shift

## 2024-07-03 NOTE — Plan of Care (Signed)
  Problem: Clinical Measurements: Goal: Ability to maintain clinical measurements within normal limits will improve Outcome: Progressing Goal: Diagnostic test results will improve Outcome: Progressing Goal: Respiratory complications will improve Outcome: Progressing Goal: Cardiovascular complication will be avoided Outcome: Progressing   Problem: Activity: Goal: Risk for activity intolerance will decrease Outcome: Progressing   Problem: Nutrition: Goal: Adequate nutrition will be maintained Outcome: Progressing   Problem: Elimination: Goal: Will not experience complications related to bowel motility Outcome: Progressing Goal: Will not experience complications related to urinary retention Outcome: Progressing

## 2024-07-04 DIAGNOSIS — J9602 Acute respiratory failure with hypercapnia: Secondary | ICD-10-CM | POA: Diagnosis not present

## 2024-07-04 DIAGNOSIS — J9601 Acute respiratory failure with hypoxia: Secondary | ICD-10-CM | POA: Diagnosis not present

## 2024-07-04 DIAGNOSIS — J154 Pneumonia due to other streptococci: Secondary | ICD-10-CM | POA: Diagnosis not present

## 2024-07-04 DIAGNOSIS — R739 Hyperglycemia, unspecified: Secondary | ICD-10-CM

## 2024-07-04 DIAGNOSIS — G9341 Metabolic encephalopathy: Secondary | ICD-10-CM | POA: Diagnosis not present

## 2024-07-04 LAB — RENAL FUNCTION PANEL
Albumin: 2.6 g/dL — ABNORMAL LOW (ref 3.5–5.0)
Anion gap: 8 (ref 5–15)
BUN: 28 mg/dL — ABNORMAL HIGH (ref 6–20)
CO2: 28 mmol/L (ref 22–32)
Calcium: 8.9 mg/dL (ref 8.9–10.3)
Chloride: 106 mmol/L (ref 98–111)
Creatinine, Ser: 0.39 mg/dL — ABNORMAL LOW (ref 0.44–1.00)
GFR, Estimated: >60 mL/min
Glucose, Bld: 229 mg/dL — ABNORMAL HIGH (ref 70–99)
Phosphorus: 4.3 mg/dL (ref 2.5–4.6)
Potassium: 4.7 mmol/L (ref 3.5–5.1)
Sodium: 142 mmol/L (ref 135–145)

## 2024-07-04 LAB — CULTURE, BAL-QUANTITATIVE W GRAM STAIN: Culture: NO GROWTH

## 2024-07-04 LAB — BLOOD GAS, ARTERIAL
Acid-Base Excess: 9.2 mmol/L — ABNORMAL HIGH (ref 0.0–2.0)
Bicarbonate: 33.5 mmol/L — ABNORMAL HIGH (ref 20.0–28.0)
O2 Content: 40 L/min
O2 Saturation: 95.8 %
PEEP: 5 cmH2O
Patient temperature: 37
Spontaneous VT: 533 mL
pCO2 arterial: 43 mmHg (ref 32–48)
pH, Arterial: 7.5 — ABNORMAL HIGH (ref 7.35–7.45)
pO2, Arterial: 63 mmHg — ABNORMAL LOW (ref 83–108)

## 2024-07-04 LAB — CULTURE, BLOOD (ROUTINE X 2)
Culture: NO GROWTH
Culture: NO GROWTH
Special Requests: ADEQUATE

## 2024-07-04 LAB — TRIGLYCERIDES: Triglycerides: 238 mg/dL — ABNORMAL HIGH

## 2024-07-04 LAB — CBC
HCT: 29 % — ABNORMAL LOW (ref 36.0–46.0)
Hemoglobin: 8.9 g/dL — ABNORMAL LOW (ref 12.0–15.0)
MCH: 30.2 pg (ref 26.0–34.0)
MCHC: 30.7 g/dL (ref 30.0–36.0)
MCV: 98.3 fL (ref 80.0–100.0)
Platelets: 416 K/uL — ABNORMAL HIGH (ref 150–400)
RBC: 2.95 MIL/uL — ABNORMAL LOW (ref 3.87–5.11)
RDW: 14 % (ref 11.5–15.5)
WBC: 32.9 K/uL — ABNORMAL HIGH (ref 4.0–10.5)
nRBC: 0 % (ref 0.0–0.2)

## 2024-07-04 LAB — GLUCOSE, CAPILLARY
Glucose-Capillary: 207 mg/dL — ABNORMAL HIGH (ref 70–99)
Glucose-Capillary: 176 mg/dL — ABNORMAL HIGH (ref 70–99)
Glucose-Capillary: 203 mg/dL — ABNORMAL HIGH (ref 70–99)
Glucose-Capillary: 199 mg/dL — ABNORMAL HIGH (ref 70–99)
Glucose-Capillary: 190 mg/dL — ABNORMAL HIGH (ref 70–99)
Glucose-Capillary: 180 mg/dL — ABNORMAL HIGH (ref 70–99)

## 2024-07-04 LAB — MAGNESIUM: Magnesium: 1.8 mg/dL (ref 1.7–2.4)

## 2024-07-04 MED ORDER — FUROSEMIDE 10 MG/ML IJ SOLN
40.0000 mg | Freq: Once | INTRAMUSCULAR | Status: AC
  Administered 2024-07-04: 40 mg via INTRAVENOUS
  Filled 2024-07-04: qty 4

## 2024-07-04 MED ORDER — NOREPINEPHRINE 4 MG/250ML-% IV SOLN
INTRAVENOUS | Status: AC
  Filled 2024-07-04: qty 250

## 2024-07-04 MED ORDER — DEXMEDETOMIDINE HCL IN NACL 400 MCG/100ML IV SOLN
0.0000 ug/kg/h | INTRAVENOUS | Status: DC
  Administered 2024-07-04: 0.4 ug/kg/h via INTRAVENOUS
  Filled 2024-07-04: qty 100

## 2024-07-04 MED ORDER — MIDAZOLAM HCL (PF) 2 MG/2ML IJ SOLN: 4.0000 mg | Freq: Once | INTRAMUSCULAR | Status: DC

## 2024-07-04 MED ORDER — METHYLPREDNISOLONE SODIUM SUCC 40 MG IJ SOLR
20.0000 mg | Freq: Every day | INTRAMUSCULAR | Status: AC
  Administered 2024-07-05 – 2024-07-08 (×4): 20 mg via INTRAVENOUS
  Filled 2024-07-04 (×4): qty 1

## 2024-07-04 NOTE — Plan of Care (Signed)
" °  Problem: Clinical Measurements: Goal: Ability to maintain clinical measurements within normal limits will improve Outcome: Not Progressing Goal: Respiratory complications will improve Outcome: Not Progressing Goal: Cardiovascular complication will be avoided Outcome: Progressing   Problem: Nutrition: Goal: Adequate nutrition will be maintained Outcome: Progressing   Problem: Elimination: Goal: Will not experience complications related to bowel motility Outcome: Not Progressing Goal: Will not experience complications related to urinary retention Outcome: Progressing   Problem: Pain Managment: Goal: General experience of comfort will improve and/or be controlled Outcome: Progressing   Problem: Respiratory: Goal: Ability to maintain adequate ventilation will improve Outcome: Progressing   "

## 2024-07-04 NOTE — Progress Notes (Signed)
 Pt was on PSV for approx 1 hour this shift.  After one hour pt started having increased WOB and was becoming anxious.  Pt was placed back on PRVC on the documented settings, however within a few minutes pt's 02 was increased to 70% and peep to 10.  Sedation was started again

## 2024-07-04 NOTE — Progress Notes (Signed)
 RN called stating that the pt's Sp02 is dropping.  Upon entering room pt was 85%.  I changed the pulse ox probe and moved the probe to her rt big toe.  Pt's Sp02 is normal 90-92%  The goal is 88% or higher

## 2024-07-04 NOTE — Progress Notes (Signed)
 Pt has been stable this shift allowing for some weaning to be done, however, she was only able to tolerate about an hour. Hollister ETT holder was changed this shift.  No redness on cheeks,no breakdown or open wounds on cheeks.  ETT is at the 26cm mark bilateral breath sounds with slight rhonchi suctioning for small amounts of thick/thin white secretions.  Oral secretions are copious thick and creamy.  Vent circuit was changed to a heated wire due to the fact that pt has been on the ventilator for 7 days and is still requiring high amounts of peep and oxygen.  The plan is for the patient to get a trach.  Pt received all of her scheduled respiratory treatments this shift.  Bag and mask at [facility] and hooked up to oxygen flowmeter.

## 2024-07-04 NOTE — Progress Notes (Signed)
 PHARMACY CONSULT NOTE  Pharmacy Consult for Electrolyte Monitoring and Replacement   Recent Labs: Potassium (mmol/L)  Date Value  07/04/2024 4.7  10/05/2013 3.5   Magnesium  (mg/dL)  Date Value  87/78/7974 1.8  01/29/2013 2.1   Calcium  (mg/dL)  Date Value  87/78/7974 8.9   Calcium , Total (mg/dL)  Date Value  96/75/7984 9.1   Albumin (g/dL)  Date Value  87/78/7974 2.6 (L)  10/05/2013 4.3   Phosphorus (mg/dL)  Date Value  87/78/7974 4.3  07/21/2012 2.1 (L)   Sodium (mmol/L)  Date Value  07/04/2024 142  10/05/2013 135 (L)    Assessment: 43 y.o. female with medical history significant of essential hypertension, seizure disorder, polysubstance abuse, recent use of fentanyl  and heroin, who presents to the hospital with progressively worsening short of breath, hypoxia.  Pharmacy is asked to follow and replace electrolytes while in CCU  Nutrition: Vital AF at 50 mL/hr + FWF 200 mL every 4 hours  Diuretics: furosemide  40 mg IV x 1  Goal of Therapy:  Electrolytes WNL  Plan:  --2 grams IV magnesium  sulfate x 1 --Follow-up electrolytes with AM labs tomorrow  [name] Phillips 07/04/2024 7:13 AM

## 2024-07-04 NOTE — Progress Notes (Signed)
 "  NAME:  [name] Phillips, MRN:  [id number], DOB:  01-22-1981, LOS: 7 ADMISSION DATE:  06/27/2024  CHIEF COMPLAINT:  severe resp failure, COCAINE ABUSE AND STREP PNEUMONIA   History of Present Illness:   43 year old female with history of polysubstance use (IVDU with fentanyl , last injection 4 days ago) who presents with increased shortness of breath and admitted for management of pneumonia. +COCAINE  Patient with worsening respiratory status with increased work of breathing, worsening hypoxia, and increased tachycardia. With her very severe pneumonia we would need to escalate therapy further with intubation, mechanical ventilation, and consideration for bronchoscopy to clear airway secretions. Discussed with the patient and her husband at bedside and she consents to proceed.     Significant Hospital Events: Including procedures, antibiotic start and stop dates in addition to other pertinent events   06/27/24: admit with right sided pneumonia and respiratory failure. CXR with right lung white out, INTUBATED, S/p BRONCH, ART LINE PLACED 12/14 BLOOD CX +STREP PNEUMONIA 12/15 remains on vent 12/16 remains on vent 12/17 remains on vent, severe hypoxia, CVL placed 12/18 severe Hypoxia s/p BRONCH mucoid secretions, worsening CXR, ABX broadened to cover Pseudomonas and MRSA 12/19 severe hypoxia 12/21 attempt sedation wean, diurese     Micro Data:  12/14 BLOOD CX + STREP PNEUMONIA  Antimicrobials:   Antibiotics Given (last 72 hours)     Date/Time Action Medication Dose Rate   07/01/24 1418 New Bag/Given   azithromycin  (ZITHROMAX ) 500 mg in sodium chloride  0.9 % 250 mL IVPB 500 mg 250 mL/hr   07/01/24 1458 New Bag/Given   linezolid  (ZYVOX ) IVPB 600 mg 600 mg 300 mL/hr   07/01/24 1601 New Bag/Given   ceFEPIme  (MAXIPIME ) 2 g in sodium chloride  0.9 % 100 mL IVPB 2 g 200 mL/hr   07/01/24 2141 New Bag/Given   linezolid  (ZYVOX ) IVPB 600 mg 600 mg 300 mL/hr   07/01/24 2322 New Bag/Given    ceFEPIme  (MAXIPIME ) 2 g in sodium chloride  0.9 % 100 mL IVPB 2 g 200 mL/hr   07/02/24 0910 New Bag/Given   ceFEPIme  (MAXIPIME ) 2 g in sodium chloride  0.9 % 100 mL IVPB 2 g 200 mL/hr   07/02/24 1234 New Bag/Given   linezolid  (ZYVOX ) IVPB 600 mg 600 mg 300 mL/hr   07/02/24 1550 New Bag/Given   azithromycin  (ZITHROMAX ) 500 mg in sodium chloride  0.9 % 250 mL IVPB 500 mg 250 mL/hr   07/02/24 1603 New Bag/Given   ceFEPIme  (MAXIPIME ) 2 g in sodium chloride  0.9 % 100 mL IVPB 2 g 200 mL/hr   07/02/24 2122 New Bag/Given   linezolid  (ZYVOX ) IVPB 600 mg 600 mg 300 mL/hr   07/03/24 0016 New Bag/Given   ceFEPIme  (MAXIPIME ) 2 g in sodium chloride  0.9 % 100 mL IVPB 2 g 200 mL/hr   07/03/24 0758 New Bag/Given   ceFEPIme  (MAXIPIME ) 2 g in sodium chloride  0.9 % 100 mL IVPB 2 g 200 mL/hr   07/03/24 1101 New Bag/Given   linezolid  (ZYVOX ) IVPB 600 mg 600 mg 300 mL/hr   07/03/24 1629 New Bag/Given   ceFEPIme  (MAXIPIME ) 2 g in sodium chloride  0.9 % 100 mL IVPB 2 g 200 mL/hr   07/03/24 2218 New Bag/Given   linezolid  (ZYVOX ) IVPB 600 mg 600 mg 300 mL/hr   07/03/24 2354 New Bag/Given   ceFEPIme  (MAXIPIME ) 2 g in sodium chloride  0.9 % 100 mL IVPB 2 g 200 mL/hr   07/04/24 0744 New Bag/Given   ceFEPIme  (MAXIPIME ) 2 g in sodium  chloride 0.9 % 100 mL IVPB 2 g 200 mL/hr   07/04/24 0924 New Bag/Given   linezolid  (ZYVOX ) IVPB 600 mg 600 mg 300 mL/hr            Interim History / Subjective:  Remains intubated and sedated on low dose pressors, no other acute overnight events     Vent Mode: PRVC FiO2 (%):  [60 %-90 %] 60 % Set Rate:  [28 bmp] 28 bmp Vt Set:  [390 mL] 390 mL PEEP:  [12 cmH20] 12 cmH20      Objective   Blood pressure (!) 197/98, pulse 95, temperature 97.7 F (36.5 C), temperature source Axillary, resp. rate (!) 28, height 5' 4 (1.626 m), weight 73.5 kg, last menstrual period 06/08/2024, SpO2 98%.    Vent Mode: PRVC FiO2 (%):  [60 %-90 %] 60 % Set Rate:  [28 bmp] 28 bmp Vt Set:   [390 mL] 390 mL PEEP:  [12 cmH20] 12 cmH20   Intake/Output Summary (Last 24 hours) at 07/04/2024 9070 Last data filed at 07/04/2024 0900 Gross per 24 hour  Intake 5595.47 ml  Output 3625 ml  Net 1970.47 ml   Filed Weights   06/27/24 1025 06/27/24 1127 06/27/24 1614  Weight: 73.5 kg 73.5 kg 73.5 kg    General:  well nourished young F intubated and sedated in NAD HEENT: MM pink/moist, sclera anicteric  Neuro: examined on sedation RASS -4, PERLLA  CV: s1s2 rrr, no m/r/g PULM:  examined on PRVC, synchronous with vent, no rhonchi or wheezing, diminished in the bilateral bases  GI: soft, bsx4 active  Extremities: warm/dry, 1+ edema      ASSESSMENT AND PLAN 43 yo white female with severe ALI from acute severe bacterial pneumonia STREP Pneumonia with bacteremia leading to severe hypoxic resp failure metabolic encephalopathy, leading to emergent intubation  Severe ACUTE Hypoxic and Hypercapnic Respiratory Failure secondary to strep pneumonia  Acute metabolic encephalopathy -continue Mechanical Ventilator support -Wean Fio2 and PEEP as tolerated -VAP/VENT bundle implementation - Wean PEEP & FiO2 as tolerated, maintain SpO2 > 88% - Head of bed elevated 30 degrees, VAP protocol in place - Plateau pressures less than 30 cm H20  - Intermittent chest x-ray & ABG PRN - Ensure adequate pulmonary hygiene  -trial lasix  40mg  and sedation wean today  -check ABG  -may benefit from precedex , try aggressive SBT/SAT in the setting of one week of vent dependence before discussing trach -continue nebs  -10L net + since admission, lasix  naive so will start diuresing with Lasix  40mg  Ivx1 -remains on broad spectrum abx with Linezolid  and cefepime , consider narrowing to cefepime  only tomorrow pending leukocytosis improvement with steroid taper, monitor fever curve  -CXR with improving infiltrates  -Concerns for withdrawal, started clonidine   Hyperglycemia  -on SSI resistant scale  -above goal  140-180 -lantus  10 units, tapered steroids today will see how glucose responds    AKI On presentation, creatinine now improved  Appears volume overloaded plan for lasix  40mg  today -continue Foley Catheter-assess need -Avoid nephrotoxic agents -Follow urine output, BMP -Ensure adequate renal perfusion, optimize oxygenation -Renal dose medications   Husband updated at the bedside   Best practice (right click and Reselect all SmartList Selections daily)  Diet:  NPO Pain/Anxiety/Delirium protocol (if indicated): Yes (RASS goal -2) VAP protocol (if indicated): Yes DVT prophylaxis: Subcutaneous Heparin  GI prophylaxis: H2B Central venous access:  N/A Arterial line:  Yes, and it is still needed Foley:  Yes, and it is still needed Mobility:  bed rest  Code Status:  FULL CODE Disposition: ICU  Labs   CBC: Recent Labs  Lab 06/27/24 1042 06/28/24 0338 06/30/24 0346 07/01/24 0425 07/02/24 0500 07/03/24 0502 07/04/24 0416  WBC 20.8*   < > 28.3* 20.8* 19.9* 18.5* 32.9*  NEUTROABS 18.7*  --   --   --   --   --   --   HGB 11.3*   < > 9.2* 8.2* 7.5* 8.8* 8.9*  HCT 33.5*   < > 28.4* 25.9* 24.6* 27.8* 29.0*  MCV 89.6   < > 91.6 94.5 96.5 94.9 98.3  PLT 399   < > 305 267 217 218 416*   < > = values in this interval not displayed.    Basic Metabolic Panel: Recent Labs  Lab 06/30/24 0346 06/30/24 2127 07/01/24 0425 07/01/24 1442 07/01/24 2109 07/02/24 0500 07/03/24 0502 07/04/24 0416  NA 149*  --  157* 157* 156* 150* 143 142  K 3.8 4.0 4.1  --   --  3.9 4.2 4.7  CL 110  --  114*  --   --  107 103 106  CO2 30  --  36*  --   --  34* 32 28  GLUCOSE 242*  --  173*  --   --  278* 269* 229*  BUN 30*  --  35*  --   --  28* 25* 28*  CREATININE 0.63  --  0.59  --   --  0.59 0.39* 0.39*  CALCIUM  9.0  --  8.7*  --   --  8.4* 8.9 8.9  MG 2.3 1.7 1.6*  --  1.7 1.6* 1.7 1.8  PHOS 3.1 1.5* 2.7  --   --  3.2 3.6 4.3   GFR: Estimated Creatinine Clearance: 87.2 mL/min (A) (by C-G  formula based on SCr of 0.39 mg/dL (L)). Recent Labs  Lab 06/27/24 1042 06/28/24 0338 06/28/24 0605 06/29/24 0505 07/01/24 0425 07/02/24 0500 07/03/24 0502 07/04/24 0416  WBC 20.8*   < >  --    < > 20.8* 19.9* 18.5* 32.9*  LATICACIDVEN 2.4*  --  1.3  --   --   --   --   --    < > = values in this interval not displayed.    Liver Function Tests: Recent Labs  Lab 06/30/24 0346 07/01/24 0425 07/02/24 0500 07/03/24 0502 07/04/24 0416  ALBUMIN 2.4* 2.4* 2.6* 2.7* 2.6*   No results for input(s): LIPASE, AMYLASE in the last 168 hours. No results for input(s): AMMONIA in the last 168 hours.  ABG    Component Value Date/Time   PHART 7.45 07/01/2024 0425   PCO2ART 60 (H) 07/01/2024 0425   PO2ART 110 (H) 07/01/2024 0425   HCO3 41.7 (H) 07/01/2024 0425   ACIDBASEDEF 3.2 (H) 06/28/2024 0544   O2SAT 99.5 07/01/2024 0425     Coagulation Profile: No results for input(s): INR, PROTIME in the last 168 hours.  Cardiac Enzymes: No results for input(s): CKTOTAL, CKMB, CKMBINDEX, TROPONINI in the last 168 hours.  HbA1C: Hemoglobin A1C  Date/Time Value Ref Range Status  07/21/2012 04:48 AM 4.8 4.2 - 6.3 % Final    Comment:    The American Diabetes Association recommends that a primary goal of therapy should be <7% and that physicians should reevaluate the treatment regimen in patients with HbA1c values consistently >8%.    Hgb A1c MFr Bld  Date/Time Value Ref Range Status  06/29/2024 05:04 AM 6.0 (H) 4.8 - 5.6 % Final  Comment:    (NOTE) Diagnosis of Diabetes The following HbA1c ranges recommended by the American Diabetes Association (ADA) may be used as an aid in the diagnosis of diabetes mellitus.  Hemoglobin             Suggested A1C NGSP%              Diagnosis  <5.7                   Non Diabetic  5.7-6.4                Pre-Diabetic  >6.4                   Diabetic  <7.0                   Glycemic control for                       adults  with diabetes.      CBG: Recent Labs  Lab 07/03/24 1604 07/03/24 1929 07/03/24 2318 07/04/24 0334 07/04/24 0737  GLUCAP 267* 313* 344* 207* 176*    Allergies Allergies[1]      DVT/GI PRX  assessed I Assessed the need for Labs I Assessed the need for Foley I Assessed the need for Central Venous Line Family Discussion when available I Assessed the need for Mobilization I made an Assessment of medications to be adjusted accordingly Safety Risk assessment completed  CASE DISCUSSED IN MULTIDISCIPLINARY ROUNDS WITH ICU TEAM    CRITICAL CARE Performed by: Leita SAUNDERS Diesel Lina   Total critical care time: 40 minutes  Critical care time was exclusive of separately billable procedures and treating other patients.  Critical care was necessary to treat or prevent imminent or life-threatening deterioration.  Critical care was time spent personally by me on the following activities: development of treatment plan with patient and/or surrogate as well as nursing, discussions with consultants, evaluation of patient's response to treatment, examination of patient, obtaining history from patient or surrogate, ordering and performing treatments and interventions, ordering and review of laboratory studies, ordering and review of radiographic studies, pulse oximetry and re-evaluation of patient's condition.    Leita SAUNDERS Fatmata Legere, PA-C [facility] See Amion for pager If no response to pager , please call [phone] until 7pm After 7:00 pm call Elink  [phone]               [1]  Allergies Allergen Reactions   Varenicline  Tartrate Other (See Comments)    varenicline    Wellbutrin  [Bupropion ]     Suicidal thoughts    "

## 2024-07-04 NOTE — Plan of Care (Signed)
" °  Problem: Education: Goal: Knowledge of General Education information will improve Description: Including pain rating scale, medication(s)/side effects and non-pharmacologic comfort measures Outcome: Not Progressing   Problem: Health Behavior/Discharge Planning: Goal: Ability to manage health-related needs will improve Outcome: Not Progressing   Problem: Clinical Measurements: Goal: Ability to maintain clinical measurements within normal limits will improve Outcome: Not Progressing Goal: Will remain free from infection Outcome: Not Progressing Goal: Diagnostic test results will improve Outcome: Not Progressing Goal: Respiratory complications will improve Outcome: Not Progressing Goal: Cardiovascular complication will be avoided Outcome: Not Progressing   Problem: Activity: Goal: Risk for activity intolerance will decrease Outcome: Not Progressing   Problem: Nutrition: Goal: Adequate nutrition will be maintained Outcome: Not Progressing   Problem: Coping: Goal: Level of anxiety will decrease Outcome: Not Progressing   Problem: Elimination: Goal: Will not experience complications related to bowel motility Outcome: Not Progressing Goal: Will not experience complications related to urinary retention Outcome: Not Progressing   Problem: Pain Managment: Goal: General experience of comfort will improve and/or be controlled Outcome: Not Progressing   Problem: Safety: Goal: Ability to remain free from injury will improve Outcome: Not Progressing   Problem: Skin Integrity: Goal: Risk for impaired skin integrity will decrease Outcome: Not Progressing   Problem: Activity: Goal: Ability to tolerate increased activity will improve Outcome: Not Progressing   Problem: Clinical Measurements: Goal: Ability to maintain a body temperature in the normal range will improve Outcome: Not Progressing   Problem: Respiratory: Goal: Ability to maintain adequate ventilation will  improve Outcome: Not Progressing Goal: Ability to maintain a clear airway will improve Outcome: Not Progressing   Problem: Activity: Goal: Ability to tolerate increased activity will improve Outcome: Not Progressing   Problem: Respiratory: Goal: Ability to maintain a clear airway and adequate ventilation will improve Outcome: Not Progressing   Problem: Role Relationship: Goal: Method of communication will improve Outcome: Not Progressing   Problem: Education: Goal: Ability to describe self-care measures that may prevent or decrease complications (Diabetes Survival Skills Education) will improve Outcome: Not Progressing   Problem: Coping: Goal: Ability to adjust to condition or change in health will improve Outcome: Not Progressing   Problem: Fluid Volume: Goal: Ability to maintain a balanced intake and output will improve Outcome: Not Progressing   Problem: Health Behavior/Discharge Planning: Goal: Ability to identify and utilize available resources and services will improve Outcome: Not Progressing Goal: Ability to manage health-related needs will improve Outcome: Not Progressing   Problem: Metabolic: Goal: Ability to maintain appropriate glucose levels will improve Outcome: Not Progressing   Problem: Nutritional: Goal: Maintenance of adequate nutrition will improve Outcome: Not Progressing Goal: Progress toward achieving an optimal weight will improve Outcome: Not Progressing   Problem: Skin Integrity: Goal: Risk for impaired skin integrity will decrease Outcome: Not Progressing   Problem: Tissue Perfusion: Goal: Adequacy of tissue perfusion will improve Outcome: Not Progressing   "

## 2024-07-04 NOTE — Progress Notes (Signed)
 Pt placed on PSV on documented settings.  HR 107 RR23  BP 145/68 RSBI 47 will do an ABG after 30 min of PSV pt doing very well and tolerating very well no signs of any respiratory distress at this time

## 2024-07-05 ENCOUNTER — Inpatient Hospital Stay

## 2024-07-05 DIAGNOSIS — J154 Pneumonia due to other streptococci: Secondary | ICD-10-CM

## 2024-07-05 DIAGNOSIS — J9601 Acute respiratory failure with hypoxia: Secondary | ICD-10-CM | POA: Diagnosis not present

## 2024-07-05 DIAGNOSIS — R6521 Severe sepsis with septic shock: Secondary | ICD-10-CM | POA: Diagnosis not present

## 2024-07-05 DIAGNOSIS — A419 Sepsis, unspecified organism: Secondary | ICD-10-CM | POA: Diagnosis not present

## 2024-07-05 DIAGNOSIS — B953 Streptococcus pneumoniae as the cause of diseases classified elsewhere: Secondary | ICD-10-CM

## 2024-07-05 LAB — BASIC METABOLIC PANEL WITH GFR
Anion gap: 9 (ref 5–15)
BUN: 26 mg/dL — ABNORMAL HIGH (ref 6–20)
CO2: 30 mmol/L (ref 22–32)
Calcium: 8.7 mg/dL — ABNORMAL LOW (ref 8.9–10.3)
Chloride: 104 mmol/L (ref 98–111)
Creatinine, Ser: 0.45 mg/dL (ref 0.44–1.00)
GFR, Estimated: >60 mL/min
Glucose, Bld: 184 mg/dL — ABNORMAL HIGH (ref 70–99)
Potassium: 4.2 mmol/L (ref 3.5–5.1)
Sodium: 144 mmol/L (ref 135–145)

## 2024-07-05 LAB — MAGNESIUM
Magnesium: 1.7 mg/dL (ref 1.7–2.4)
Magnesium: 2 mg/dL (ref 1.7–2.4)

## 2024-07-05 LAB — RENAL FUNCTION PANEL
Albumin: 2.5 g/dL — ABNORMAL LOW (ref 3.5–5.0)
Anion gap: 8 (ref 5–15)
BUN: 25 mg/dL — ABNORMAL HIGH (ref 6–20)
CO2: 29 mmol/L (ref 22–32)
Calcium: 8.4 mg/dL — ABNORMAL LOW (ref 8.9–10.3)
Chloride: 105 mmol/L (ref 98–111)
Creatinine, Ser: 0.34 mg/dL — ABNORMAL LOW (ref 0.44–1.00)
GFR, Estimated: >60 mL/min
Glucose, Bld: 163 mg/dL — ABNORMAL HIGH (ref 70–99)
Phosphorus: 2.8 mg/dL (ref 2.5–4.6)
Potassium: 3 mmol/L — ABNORMAL LOW (ref 3.5–5.1)
Sodium: 143 mmol/L (ref 135–145)

## 2024-07-05 LAB — GLUCOSE, CAPILLARY
Glucose-Capillary: 167 mg/dL — ABNORMAL HIGH (ref 70–99)
Glucose-Capillary: 65 mg/dL — ABNORMAL LOW (ref 70–99)
Glucose-Capillary: 111 mg/dL — ABNORMAL HIGH (ref 70–99)
Glucose-Capillary: 140 mg/dL — ABNORMAL HIGH (ref 70–99)
Glucose-Capillary: 177 mg/dL — ABNORMAL HIGH (ref 70–99)
Glucose-Capillary: 135 mg/dL — ABNORMAL HIGH (ref 70–99)
Glucose-Capillary: 161 mg/dL — ABNORMAL HIGH (ref 70–99)

## 2024-07-05 LAB — CBC
HCT: 26.9 % — ABNORMAL LOW (ref 36.0–46.0)
Hemoglobin: 8.5 g/dL — ABNORMAL LOW (ref 12.0–15.0)
MCH: 30.4 pg (ref 26.0–34.0)
MCHC: 31.6 g/dL (ref 30.0–36.0)
MCV: 96.1 fL (ref 80.0–100.0)
Platelets: 415 K/uL — ABNORMAL HIGH (ref 150–400)
RBC: 2.8 MIL/uL — ABNORMAL LOW (ref 3.87–5.11)
RDW: 14 % (ref 11.5–15.5)
WBC: 26.3 K/uL — ABNORMAL HIGH (ref 4.0–10.5)
nRBC: 0 % (ref 0.0–0.2)

## 2024-07-05 LAB — BLOOD GAS, ARTERIAL
Acid-Base Excess: 7 mmol/L — ABNORMAL HIGH (ref 0.0–2.0)
Bicarbonate: 33.4 mmol/L — ABNORMAL HIGH (ref 20.0–28.0)
O2 Content: 60 L/min
O2 Saturation: 99.2 %
PEEP: 12 cmH2O
Patient temperature: 37
pCO2 arterial: 54 mmHg — ABNORMAL HIGH (ref 32–48)
pH, Arterial: 7.4 (ref 7.35–7.45)
pO2, Arterial: 109 mmHg — ABNORMAL HIGH (ref 83–108)

## 2024-07-05 MED ORDER — FREE WATER
200.0000 mL | Status: DC
  Administered 2024-07-05 – 2024-07-07 (×12): 200 mL

## 2024-07-05 MED ORDER — INSULIN GLARGINE 100 UNIT/ML ~~LOC~~ SOLN
10.0000 [IU] | Freq: Every day | SUBCUTANEOUS | Status: DC
  Administered 2024-07-05 – 2024-07-09 (×5): 10 [IU] via SUBCUTANEOUS
  Filled 2024-07-05 (×7): qty 0.1

## 2024-07-05 MED ORDER — MAGNESIUM SULFATE 2 GM/50ML IV SOLN
2.0000 g | Freq: Once | INTRAVENOUS | Status: AC
  Administered 2024-07-05: 2 g via INTRAVENOUS
  Filled 2024-07-05: qty 50

## 2024-07-05 MED ORDER — MIDAZOLAM HCL (PF) 2 MG/2ML IJ SOLN: 2.0000 mg | Freq: Once | INTRAMUSCULAR | Status: AC

## 2024-07-05 MED ORDER — DEXTROSE 50 % IV SOLN
12.5000 g | INTRAVENOUS | Status: AC
  Administered 2024-07-05: 12.5 g via INTRAVENOUS
  Filled 2024-07-05: qty 50

## 2024-07-05 MED ORDER — SODIUM CHLORIDE 0.9 % IV SOLN
2.0000 g | INTRAVENOUS | Status: AC
  Administered 2024-07-08 – 2024-07-10 (×3): 2 g via INTRAVENOUS
  Filled 2024-07-05 (×3): qty 20

## 2024-07-05 MED ORDER — FUROSEMIDE 10 MG/ML IJ SOLN
40.0000 mg | Freq: Once | INTRAMUSCULAR | Status: AC
  Administered 2024-07-05: 40 mg via INTRAVENOUS
  Filled 2024-07-05: qty 4

## 2024-07-05 MED ORDER — DEXMEDETOMIDINE HCL IN NACL 400 MCG/100ML IV SOLN
0.0000 ug/kg/h | INTRAVENOUS | Status: DC
  Administered 2024-07-05 (×3): 1.2 ug/kg/h via INTRAVENOUS
  Administered 2024-07-05: 0.4 ug/kg/h via INTRAVENOUS
  Administered 2024-07-06 – 2024-07-07 (×5): 1.2 ug/kg/h via INTRAVENOUS
  Administered 2024-07-07 (×2): 1.1 ug/kg/h via INTRAVENOUS
  Administered 2024-07-07: 1 ug/kg/h via INTRAVENOUS
  Administered 2024-07-07: 0.9 ug/kg/h via INTRAVENOUS
  Administered 2024-07-08 (×2): 1.2 ug/kg/h via INTRAVENOUS
  Administered 2024-07-08: 1.1 ug/kg/h via INTRAVENOUS
  Administered 2024-07-08 (×2): 1 ug/kg/h via INTRAVENOUS
  Administered 2024-07-09: 1.2 ug/kg/h via INTRAVENOUS
  Filled 2024-07-05 (×20): qty 100

## 2024-07-05 MED ORDER — K PHOS MONO-SOD PHOS DI & MONO 155-852-130 MG PO TABS
500.0000 mg | ORAL_TABLET | Freq: Once | ORAL | Status: DC
  Filled 2024-07-05: qty 2

## 2024-07-05 MED ORDER — MIDAZOLAM HCL 2 MG/2ML IJ SOLN
INTRAMUSCULAR | Status: AC
  Administered 2024-07-05: 2 mg via INTRAVENOUS
  Filled 2024-07-05: qty 2

## 2024-07-05 MED ORDER — POTASSIUM CHLORIDE 20 MEQ PO PACK
40.0000 meq | PACK | Freq: Three times a day (TID) | ORAL | Status: AC
  Administered 2024-07-05 (×3): 40 meq
  Filled 2024-07-05 (×3): qty 2

## 2024-07-05 MED ORDER — ENOXAPARIN SODIUM 40 MG/0.4ML IJ SOSY
40.0000 mg | PREFILLED_SYRINGE | Freq: Every day | INTRAMUSCULAR | Status: DC
  Administered 2024-07-05 – 2024-07-15 (×11): 40 mg via SUBCUTANEOUS
  Filled 2024-07-05 (×7): qty 0.4

## 2024-07-05 NOTE — Progress Notes (Addendum)
 "  NAME:  [name] Phillips, MRN:  [id number], DOB:  Feb 01, 1981, LOS: 8 ADMISSION DATE:  06/27/2024  CHIEF COMPLAINT:  severe resp failure, COCAINE ABUSE AND STREP PNEUMONIA  History of Present Illness:   43 year old female with history of polysubstance use (IVDU with fentanyl , last injection 4 days ago) who presents with increased shortness of breath and admitted for management of pneumonia. +COCAINE  Patient with worsening respiratory status with increased work of breathing, worsening hypoxia, and increased tachycardia. With her very severe pneumonia we would need to escalate therapy further with intubation, mechanical ventilation, and consideration for bronchoscopy to clear airway secretions. Discussed with the patient and her husband at bedside and she consents to proceed.   Pertinent Medical History:  Hypertension Seizure Alcohol  Use Polysubstance abuse  Significant Hospital Events: Including procedures, antibiotic start and stop dates in addition to other pertinent events   12/14: admit with right sided pneumonia and respiratory failure. CXR with right lung white out, INTUBATED, S/p BRONCH, ART LINE PLACED 12/14: BLOOD CX +STREP PNEUMONIA 12/15: remains on vent 12/16: remains on vent 12/17: remains on vent, severe hypoxia, CVL placed 12/18: severe Hypoxia s/p BRONCH mucoid secretions, worsening CXR, ABX broadened to cover Pseudomonas and MRSA 12/19: severe hypoxia 12/21: attempt sedation wean, diurese 12/22: Remains critically ill requiring mechanical ventilation, levophed  and multiple sedation agents. Diuresed with Lasix  40mg  yesterday; 5.9L UO. Will attempt at [facility] today and wean vent requirements if able  Micro Data:  12/14 BLOOD CX + STREP PNEUMONIA 12/14 Respiratory Viral Panel 20 Pathogen >> negative 12/14 Aspergillus >> negative 12/14 Histoplasma Antigen >> negative 12/14 Fungitell Beta D Glucan >> negative 12/14 Pneumocystitis smear >> negative 12/14 Fungal Culture >>  negative  Antimicrobials:   Antibiotics Given (last 72 hours)     Date/Time Action Medication Dose Rate   07/02/24 0910 New Bag/Given   ceFEPIme  (MAXIPIME ) 2 g in sodium chloride  0.9 % 100 mL IVPB 2 g 200 mL/hr   07/02/24 1234 New Bag/Given   linezolid  (ZYVOX ) IVPB 600 mg 600 mg 300 mL/hr   07/02/24 1550 New Bag/Given   azithromycin  (ZITHROMAX ) 500 mg in sodium chloride  0.9 % 250 mL IVPB 500 mg 250 mL/hr   07/02/24 1603 New Bag/Given   ceFEPIme  (MAXIPIME ) 2 g in sodium chloride  0.9 % 100 mL IVPB 2 g 200 mL/hr   07/02/24 2122 New Bag/Given   linezolid  (ZYVOX ) IVPB 600 mg 600 mg 300 mL/hr   07/03/24 0016 New Bag/Given   ceFEPIme  (MAXIPIME ) 2 g in sodium chloride  0.9 % 100 mL IVPB 2 g 200 mL/hr   07/03/24 0758 New Bag/Given   ceFEPIme  (MAXIPIME ) 2 g in sodium chloride  0.9 % 100 mL IVPB 2 g 200 mL/hr   07/03/24 1101 New Bag/Given   linezolid  (ZYVOX ) IVPB 600 mg 600 mg 300 mL/hr   07/03/24 1629 New Bag/Given   ceFEPIme  (MAXIPIME ) 2 g in sodium chloride  0.9 % 100 mL IVPB 2 g 200 mL/hr   07/03/24 2218 New Bag/Given   linezolid  (ZYVOX ) IVPB 600 mg 600 mg 300 mL/hr   07/03/24 2354 New Bag/Given   ceFEPIme  (MAXIPIME ) 2 g in sodium chloride  0.9 % 100 mL IVPB 2 g 200 mL/hr   07/04/24 0744 New Bag/Given   ceFEPIme  (MAXIPIME ) 2 g in sodium chloride  0.9 % 100 mL IVPB 2 g 200 mL/hr   07/04/24 9075 New Bag/Given   linezolid  (ZYVOX ) IVPB 600 mg 600 mg 300 mL/hr   07/04/24 1600 New Bag/Given   ceFEPIme  (MAXIPIME )  2 g in sodium chloride  0.9 % 100 mL IVPB 2 g 200 mL/hr   07/04/24 2227 New Bag/Given   linezolid  (ZYVOX ) IVPB 600 mg 600 mg 300 mL/hr   07/05/24 0022 New Bag/Given   ceFEPIme  (MAXIPIME ) 2 g in sodium chloride  0.9 % 100 mL IVPB 2 g 200 mL/hr      Interim History / Subjective:  Remains intubated and sedated on low dose pressors, no other acute overnight events   Vent Mode: PRVC FiO2 (%):  [40 %-70 %] 70 % Set Rate:  [15 bmp-24 bmp] 24 bmp Vt Set:  [390 mL] 390 mL PEEP:  [5  cmH20-10 cmH20] 10 cmH20 Pressure Support:  [8 cmH20] 8 cmH20   Objective   Blood pressure (!) 197/98, pulse (!) 117, temperature 99.7 F (37.6 C), temperature source Axillary, resp. rate (!) 24, height 5' 4.02 (1.626 m), weight 73.5 kg, last menstrual period 06/08/2024, SpO2 100%.    Vent Mode: PRVC FiO2 (%):  [40 %-70 %] 70 % Set Rate:  [15 bmp-24 bmp] 24 bmp Vt Set:  [390 mL] 390 mL PEEP:  [5 cmH20-10 cmH20] 10 cmH20 Pressure Support:  [8 cmH20] 8 cmH20   Intake/Output Summary (Last 24 hours) at 07/05/2024 0756 Last data filed at 07/05/2024 0700 Gross per 24 hour  Intake 4808.01 ml  Output 5700 ml  Net -891.99 ml   Filed Weights   06/27/24 1025 06/27/24 1127 06/27/24 1614  Weight: 73.5 kg 73.5 kg 73.5 kg   Physical Exam: General:  critically ill but well nourished young female, intubated and sedated in NAD HENT: Normocephalic, atraumatic, MM pink/moist, sclera anicteric, supple, no JVD CV: sinus tachycardia, S1 S2, no m/r/g, radial pulses 2+, distal pulses 1+ PULM:  on PRVC, synchronous with vent, no rhonchi or wheezing, diminished in the bilateral bases  GI: soft, non-tender, non-distended, no rebound/guarding, bowel sounds x 4  Extremities: warm/dry, 1+ edema of bilateral upper and lower extremities Neuro: intubated and sedated, opens eyes and tracks, no focal neuro deficits, follows some commands, PERRL   ASSESSMENT AND PLAN  43 yo white female with severe ALI from acute severe bacterial pneumonia (STREP Pneumonia) with bacteremia leading to severe hypoxic respiratory failure and metabolic encephalopathy, leading to emergent intubation  #Acute Metabolic Encephalopathy #Polysubstance Abuse #Sedation due to mechanical ventilation - Supportive care - PAD protocol - Sedation to maintain RASS goal - Currently on Propofol  + Versed  + Dilaudid  gtt - WUA daily; turned off versed , will wean dilaudid  as able and use precedex  + propofol  - Continue clonidine  - Encourage  family support at bedside  #Severe Acute Hypoxic and Hypercapnic Respiratory Failure secondary to Strep Pneumonia  - Full vent support - Wean Fio2 and PEEP as tolerated - VAP protocol - Wean PEEP & FiO2 as tolerated, maintain SpO2 > 88% - SBT once mental and respiratory parameters met - Consider discussing trach given >1 week vent dependence - Head of bed elevated 30 degrees - Plateau pressures less than 30 cm H20  - Intermittent chest x-ray & ABG PRN - Ensure adequate pulmonary hygiene  - Duonebs Q6h - Solumedrol IV 20mg  daily - Received 40mg  Lasix  yesterday with 5.9L UO; repeat lasix  - Intermittent CXR and ABG; repeat CXR to rule out empyema  #Septic Shock s/t Strep Pneumonia Bacteremia 12/14 ECHO: LVEF 65-70% with normal function; RV normal size and function; mildly dilated LA; mild mitral valve regurgitation; no AR; IVC normal size with >50% variability - Continuous cardiac monitoring - Vasopressors to maintain MAP >65 -  Currently on low requirements of levophed  gtt - Wean vasopressors as able - EKG prn - Antihypertensives prn - Hydralazine  10mg  Q4h prn SBP>185 or DBP>100, Labetalol  10-20mg  Q2h prn SBP>185 or DBP >100  - Metoprolol  5mg  Q6h for sustained tachycardia >130  #AKI secondary to Septic Shock #Hypernatremia - Strict I/O - Will rediurese with Lasix  40mg  IV x 1 and recheck BMP this afternoon - Trend BMP and monitor renal function - Avoid nephrotoxins as able - Ensure adequate renal perfusion - Continue Foley Catheter-assess need - Renal dose medications - Continue free water  per tube; will cut dose in half today - ICU electrolyte replacement protocol - Pharmacy to assist with replacement as indicated  #GI Prophylaxis - Constipation protocol prn - Diet: tube feeds - Pepcid  40mg  daily per tube  #Acute Anemia #DVT Prophylaxis - Trend CBC - Monitor coags prn - Transfuse if Hgb < 7 or platelets <10 - Monitor for s/sx of bleeding - Continue SQ Heparin   Q8h  #Hyperglycemia  - ICU hypo/hyperglycemia protocol - Goal range 140-180 - Lantus  10 units - Novolog  - Tapered steroids today will see how glucose responds  #Strep Pneumonia Bacteremia - Trend WBC and monitor fever curve - Tylenol  for fevers - Continue ABX: cefepime  and linezolid  - Consider narrowing to cefepime  only d/t leukocytosis improvement and steroid taper - BAL negative - RVP negative  12/22: Updated husband at bedside regarding current status and plan of care  Best practice (right click and Reselect all SmartList Selections daily)  Diet:  NPO Pain/Anxiety/Delirium protocol (if indicated): Yes (RASS goal -2) VAP protocol (if indicated): Yes DVT prophylaxis: Subcutaneous Heparin  GI prophylaxis: H2B Central venous access:  Left IJV CVC Arterial line:  Yes, and it is still needed Foley:  Yes, and it is still needed Mobility:  bed rest  Code Status:  FULL CODE Disposition: ICU  Labs   CBC: Recent Labs  Lab 07/01/24 0425 07/02/24 0500 07/03/24 0502 07/04/24 0416 07/05/24 0400  WBC 20.8* 19.9* 18.5* 32.9* 26.3*  HGB 8.2* 7.5* 8.8* 8.9* 8.5*  HCT 25.9* 24.6* 27.8* 29.0* 26.9*  MCV 94.5 96.5 94.9 98.3 96.1  PLT 267 217 218 416* 415*    Basic Metabolic Panel: Recent Labs  Lab 07/01/24 0425 07/01/24 1442 07/01/24 2109 07/02/24 0500 07/03/24 0502 07/04/24 0416 07/05/24 0400  NA 157*   < > 156* 150* 143 142 143  K 4.1  --   --  3.9 4.2 4.7 3.0*  CL 114*  --   --  107 103 106 105  CO2 36*  --   --  34* 32 28 29  GLUCOSE 173*  --   --  278* 269* 229* 163*  BUN 35*  --   --  28* 25* 28* 25*  CREATININE 0.59  --   --  0.59 0.39* 0.39* 0.34*  CALCIUM  8.7*  --   --  8.4* 8.9 8.9 8.4*  MG 1.6*  --  1.7 1.6* 1.7 1.8 1.7  PHOS 2.7  --   --  3.2 3.6 4.3 2.8   < > = values in this interval not displayed.   GFR: Estimated Creatinine Clearance: 87.2 mL/min (A) (by C-G formula based on SCr of 0.34 mg/dL (L)). Recent Labs  Lab 07/02/24 0500 07/03/24 0502  07/04/24 0416 07/05/24 0400  WBC 19.9* 18.5* 32.9* 26.3*    Liver Function Tests: Recent Labs  Lab 07/01/24 0425 07/02/24 0500 07/03/24 0502 07/04/24 0416 07/05/24 0400  ALBUMIN 2.4* 2.6* 2.7* 2.6* 2.5*  No results for input(s): LIPASE, AMYLASE in the last 168 hours. No results for input(s): AMMONIA in the last 168 hours.  ABG    Component Value Date/Time   PHART 7.5 (H) 07/04/2024 1242   PCO2ART 43 07/04/2024 1242   PO2ART 63 (L) 07/04/2024 1242   HCO3 33.5 (H) 07/04/2024 1242   ACIDBASEDEF 3.2 (H) 06/28/2024 0544   O2SAT 95.8 07/04/2024 1242     Coagulation Profile: No results for input(s): INR, PROTIME in the last 168 hours.  Cardiac Enzymes: No results for input(s): CKTOTAL, CKMB, CKMBINDEX, TROPONINI in the last 168 hours.  HbA1C: Hemoglobin A1C  Date/Time Value Ref Range Status  07/21/2012 04:48 AM 4.8 4.2 - 6.3 % Final    Comment:    The American Diabetes Association recommends that a primary goal of therapy should be <7% and that physicians should reevaluate the treatment regimen in patients with HbA1c values consistently >8%.    Hgb A1c MFr Bld  Date/Time Value Ref Range Status  06/29/2024 05:04 AM 6.0 (H) 4.8 - 5.6 % Final    Comment:    (NOTE) Diagnosis of Diabetes The following HbA1c ranges recommended by the American Diabetes Association (ADA) may be used as an aid in the diagnosis of diabetes mellitus.  Hemoglobin             Suggested A1C NGSP%              Diagnosis  <5.7                   Non Diabetic  5.7-6.4                Pre-Diabetic  >6.4                   Diabetic  <7.0                   Glycemic control for                       adults with diabetes.      CBG: Recent Labs  Lab 07/04/24 1932 07/04/24 2326 07/05/24 0356 07/05/24 0420 07/05/24 0751  GLUCAP 190* 180* 65* 167* 111*    Allergies Allergies[1]   DVT/GI PRX assessed I Assessed the need for Labs I Assessed the need for Foley I  Assessed the need for Central Venous Line Family Discussion when available I Assessed the need for Mobilization I made an Assessment of medications to be adjusted accordingly Safety Risk assessment completed  CASE DISCUSSED IN MULTIDISCIPLINARY ROUNDS WITH ICU TEAM   CRITICAL CARE Performed by: Robet Kim   Total critical care time: 55 minutes  Robet Kim, PA-C [facility] [place] [facility] P[facility]M Team Contact Info: [phone]                 [1]  Allergies Allergen Reactions   Varenicline  Tartrate Other (See Comments)    varenicline    Wellbutrin  [Bupropion ]     Suicidal thoughts    "

## 2024-07-05 NOTE — Progress Notes (Signed)
 PHARMACY CONSULT NOTE  Pharmacy Consult for Electrolyte Monitoring and Replacement   Recent Labs: Potassium (mmol/L)  Date Value  07/05/2024 3.0 (L)  10/05/2013 3.5   Magnesium  (mg/dL)  Date Value  87/77/7974 1.7  01/29/2013 2.1   Calcium  (mg/dL)  Date Value  87/77/7974 8.4 (L)   Calcium , Total (mg/dL)  Date Value  96/75/7984 9.1   Albumin (g/dL)  Date Value  87/77/7974 2.5 (L)  10/05/2013 4.3   Phosphorus (mg/dL)  Date Value  87/77/7974 2.8  07/21/2012 2.1 (L)   Sodium (mmol/L)  Date Value  07/05/2024 143  10/05/2013 135 (L)    Assessment: 43 y.o. female with medical history significant of essential hypertension, seizure disorder, polysubstance abuse, recent use of fentanyl  and heroin, who presents to the hospital with progressively worsening short of breath, hypoxia.  Pharmacy is asked to follow and replace electrolytes while in CCU  Nutrition: Vital AF at 50 mL/hr + FWF 200 mL every 4 hours  Diuretics: furosemide  40 mg IV x 1  Goal of Therapy:  Electrolytes WNL  Plan:  --K 3, Kcl 40 mEq per tube TID x 3 doses --Phos 2.8, K Phos  Neutral 500 mg per tube x 1 dose --Mg 1.7, magnesium  sulfate 2 g IV x 1 --Follow-up electrolytes with AM labs tomorrow  Marolyn KATHEE Mare 07/05/2024 8:01 AM

## 2024-07-05 NOTE — Progress Notes (Signed)
 Nutrition Follow Up Note   DOCUMENTATION CODES:   Not applicable  INTERVENTION:   Continue Vital 1.2@50ml /hr + ProSource TF 20- Give 60ml daily via tube  Free water  flushes q4 hours per MD  Regimen provides 1520kcal/day, 110g/day protein and 2173ml/day of free water .   Thiamine  100mg  daily via tube   Cholecalciferol  2,000 units daily via tube   Folic acid  1mg  daily via tube   NUTRITION DIAGNOSIS:   Inadequate oral intake related to inability to eat (pt sedated and ventilated) as evidenced by NPO status. -ongoing   GOAL:   Provide needs based on ASPEN/SCCM guidelines -met   MONITOR:   Vent status, Labs, Weight trends, Skin, I & O's, TF tolerance  ASSESSMENT:   43 y/o female with h/o anxiety, HTN, MDD, HCV, hepatic steatosis, HLD, GERD, seizures, etoh and substance abuse who is admitted with bacterial PNA, septic shock, bactermia and AKI.  Pt remains sedated and ventilated. Pt is tolerating tube feeds at goal rate. Hypernatremia resolved; free water  decreased per MD. Pt continues to be diuresed. Pt +9.1L on her I & Os. Rectal tube in place and pt is having bowel function. No new weight since admit as bed scale broken.    Medications reviewed and include: cholecalciferol , colace, pepcid , folic acid , heparin , insulin , solu-medrol , Kphos, miralax , klor-con , thiamine , cefepime , linezolid , Mg sulfate, levophed , propofol    Labs reviewed: K 3.0(L), BUN 25(H), creat 0.34(H), P 2.8 wnl, Mg 1.7 wnl  Vitamin D - 27.3(L)- 12/16 Wbc- 26.3(H), Hgb 8.5(L), Hct 26.9(L) Cbgs- 111, 167, 65 x 24 hrs   Patient is currently intubated on ventilator support MV: 9.6 L/min Temp (24hrs), Avg:98.4 F (36.9 C), Min:97.6 F (36.4 C), Max:99.7 F (37.6 C)  Propofol : 13.23 ml/hr- provides 349kcal/day   MAP >67mmHg   UOP-   Diet Order:   Diet Order             Diet NPO time specified  Diet effective now                  EDUCATION NEEDS:   No education needs have  been identified at this time  Skin:  Skin Assessment: Reviewed RN Assessment (MASD)  Last BM:  12/22-  Height:   Ht Readings from Last 1 Encounters:  07/04/24 5' 4.02 (1.626 m)    Weight:   Wt Readings from Last 1 Encounters:  10/29/23 78.9 kg    Ideal Body Weight:  54.5 kg  BMI:  Body mass index is 27.79 kg/m.  Estimated Nutritional Needs:   Kcal:  1678kcal/day  Protein:  110-120g/day  Fluid:  1.7-1.9L/day  [name] Shams MS, RD, LDN If unable to be reached, please send secure chat to RD inpatient available from 8:00a-4:00p daily

## 2024-07-05 NOTE — Plan of Care (Signed)
" °  Problem: Education: Goal: Knowledge of General Education information will improve Description: Including pain rating scale, medication(s)/side effects and non-pharmacologic comfort measures Outcome: Not Progressing   Problem: Health Behavior/Discharge Planning: Goal: Ability to manage health-related needs will improve Outcome: Not Progressing   Problem: Clinical Measurements: Goal: Ability to maintain clinical measurements within normal limits will improve Outcome: Not Progressing Goal: Will remain free from infection Outcome: Not Progressing Goal: Diagnostic test results will improve Outcome: Not Progressing Goal: Respiratory complications will improve Outcome: Not Progressing Goal: Cardiovascular complication will be avoided Outcome: Not Progressing   Problem: Activity: Goal: Risk for activity intolerance will decrease Outcome: Not Progressing   Problem: Nutrition: Goal: Adequate nutrition will be maintained Outcome: Not Progressing   Problem: Coping: Goal: Level of anxiety will decrease Outcome: Not Progressing   Problem: Elimination: Goal: Will not experience complications related to bowel motility Outcome: Not Progressing Goal: Will not experience complications related to urinary retention Outcome: Not Progressing   Problem: Pain Managment: Goal: General experience of comfort will improve and/or be controlled Outcome: Not Progressing   Problem: Safety: Goal: Ability to remain free from injury will improve Outcome: Not Progressing   Problem: Skin Integrity: Goal: Risk for impaired skin integrity will decrease Outcome: Not Progressing   Problem: Activity: Goal: Ability to tolerate increased activity will improve Outcome: Not Progressing   Problem: Clinical Measurements: Goal: Ability to maintain a body temperature in the normal range will improve Outcome: Not Progressing   Problem: Respiratory: Goal: Ability to maintain adequate ventilation will  improve Outcome: Not Progressing Goal: Ability to maintain a clear airway will improve Outcome: Not Progressing   Problem: Activity: Goal: Ability to tolerate increased activity will improve Outcome: Not Progressing   Problem: Respiratory: Goal: Ability to maintain a clear airway and adequate ventilation will improve Outcome: Not Progressing   Problem: Role Relationship: Goal: Method of communication will improve Outcome: Not Progressing   Problem: Education: Goal: Ability to describe self-care measures that may prevent or decrease complications (Diabetes Survival Skills Education) will improve Outcome: Not Progressing   Problem: Coping: Goal: Ability to adjust to condition or change in health will improve Outcome: Not Progressing   Problem: Fluid Volume: Goal: Ability to maintain a balanced intake and output will improve Outcome: Not Progressing   Problem: Health Behavior/Discharge Planning: Goal: Ability to identify and utilize available resources and services will improve Outcome: Not Progressing Goal: Ability to manage health-related needs will improve Outcome: Not Progressing   Problem: Metabolic: Goal: Ability to maintain appropriate glucose levels will improve Outcome: Not Progressing   Problem: Nutritional: Goal: Maintenance of adequate nutrition will improve Outcome: Not Progressing Goal: Progress toward achieving an optimal weight will improve Outcome: Not Progressing   Problem: Skin Integrity: Goal: Risk for impaired skin integrity will decrease Outcome: Not Progressing   Problem: Tissue Perfusion: Goal: Adequacy of tissue perfusion will improve Outcome: Not Progressing   "

## 2024-07-05 NOTE — Inpatient Diabetes Management (Signed)
 Inpatient Diabetes Program Recommendations  AACE/ADA: New Consensus Statement on Inpatient Glycemic Control  Target Ranges:  Prepandial:   less than 140 mg/dL      Peak postprandial:   less than 180 mg/dL (1-2 hours)      Critically ill patients:  140 - 180 mg/dL    Latest Reference Range & Units 07/04/24 07:37 07/04/24 11:19 07/04/24 15:49 07/04/24 19:32 07/04/24 23:26 07/05/24 03:56 07/05/24 04:20  Glucose-Capillary 70 - 99 mg/dL 823 (H) 796 (H) 800 (H) 190 (H) 180 (H) 65 (L) 167 (H)   Review of Glycemic Control  Diabetes history: No Outpatient Diabetes medications: NA Current orders for Inpatient glycemic control: Lantus  10 units daily, Novolog  0-20 units Q4H; Solumedrol 20 mg Q24H, Vital @ 50 ml/hr   Inpatient Diabetes Program Recommendations:     Insulin : CBG of 65 mg/dl at 6:43 and and lab glucose 163 mg/dl at 5:99 am (65 mg/dl likely error). Noted patient received Lantus  10 units BID o 12/21 and order was changed to 10 units daily today.  Please consider ordering Novolog  3 units Q4H for tube feeding coverage. If tube feeding is stopped or held then Novolog  tube feeding coverage should also be stopped or held.  Thanks, Earnie Gainer, RN, MSN, CDCES Diabetes Coordinator Inpatient Diabetes Program [phone] (Team Pager from 8am to 5pm)

## 2024-07-06 DIAGNOSIS — R0902 Hypoxemia: Secondary | ICD-10-CM

## 2024-07-06 DIAGNOSIS — N179 Acute kidney failure, unspecified: Secondary | ICD-10-CM

## 2024-07-06 DIAGNOSIS — A419 Sepsis, unspecified organism: Secondary | ICD-10-CM | POA: Diagnosis not present

## 2024-07-06 DIAGNOSIS — J189 Pneumonia, unspecified organism: Secondary | ICD-10-CM

## 2024-07-06 DIAGNOSIS — R6521 Severe sepsis with septic shock: Secondary | ICD-10-CM | POA: Diagnosis not present

## 2024-07-06 DIAGNOSIS — R7881 Bacteremia: Secondary | ICD-10-CM

## 2024-07-06 DIAGNOSIS — J154 Pneumonia due to other streptococci: Secondary | ICD-10-CM | POA: Diagnosis not present

## 2024-07-06 DIAGNOSIS — R569 Unspecified convulsions: Secondary | ICD-10-CM

## 2024-07-06 DIAGNOSIS — J9601 Acute respiratory failure with hypoxia: Secondary | ICD-10-CM | POA: Diagnosis not present

## 2024-07-06 DIAGNOSIS — E7800 Pure hypercholesterolemia, unspecified: Secondary | ICD-10-CM

## 2024-07-06 LAB — RENAL FUNCTION PANEL
Albumin: 2.7 g/dL — ABNORMAL LOW (ref 3.5–5.0)
Anion gap: 8 (ref 5–15)
BUN: 22 mg/dL — ABNORMAL HIGH (ref 6–20)
CO2: 29 mmol/L (ref 22–32)
Calcium: 8.7 mg/dL — ABNORMAL LOW (ref 8.9–10.3)
Chloride: 106 mmol/L (ref 98–111)
Creatinine, Ser: 0.37 mg/dL — ABNORMAL LOW (ref 0.44–1.00)
GFR, Estimated: >60 mL/min
Glucose, Bld: 147 mg/dL — ABNORMAL HIGH (ref 70–99)
Phosphorus: 3.2 mg/dL (ref 2.5–4.6)
Potassium: 4.3 mmol/L (ref 3.5–5.1)
Sodium: 142 mmol/L (ref 135–145)

## 2024-07-06 LAB — BASIC METABOLIC PANEL WITH GFR
Anion gap: 9 (ref 5–15)
BUN: 23 mg/dL — ABNORMAL HIGH (ref 6–20)
CO2: 28 mmol/L (ref 22–32)
Calcium: 8.8 mg/dL — ABNORMAL LOW (ref 8.9–10.3)
Chloride: 102 mmol/L (ref 98–111)
Creatinine, Ser: 0.41 mg/dL — ABNORMAL LOW (ref 0.44–1.00)
GFR, Estimated: >60 mL/min
Glucose, Bld: 187 mg/dL — ABNORMAL HIGH (ref 70–99)
Potassium: 4.5 mmol/L (ref 3.5–5.1)
Sodium: 139 mmol/L (ref 135–145)

## 2024-07-06 LAB — GLUCOSE, CAPILLARY
Glucose-Capillary: 138 mg/dL — ABNORMAL HIGH (ref 70–99)
Glucose-Capillary: 148 mg/dL — ABNORMAL HIGH (ref 70–99)
Glucose-Capillary: 258 mg/dL — ABNORMAL HIGH (ref 70–99)
Glucose-Capillary: 165 mg/dL — ABNORMAL HIGH (ref 70–99)
Glucose-Capillary: 154 mg/dL — ABNORMAL HIGH (ref 70–99)

## 2024-07-06 LAB — CBC
HCT: 25.6 % — ABNORMAL LOW (ref 36.0–46.0)
Hemoglobin: 8.2 g/dL — ABNORMAL LOW (ref 12.0–15.0)
MCH: 30.7 pg (ref 26.0–34.0)
MCHC: 32 g/dL (ref 30.0–36.0)
MCV: 95.9 fL (ref 80.0–100.0)
Platelets: 407 K/uL — ABNORMAL HIGH (ref 150–400)
RBC: 2.67 MIL/uL — ABNORMAL LOW (ref 3.87–5.11)
RDW: 14.7 % (ref 11.5–15.5)
WBC: 22.4 K/uL — ABNORMAL HIGH (ref 4.0–10.5)
nRBC: 0 % (ref 0.0–0.2)

## 2024-07-06 LAB — MAGNESIUM: Magnesium: 1.8 mg/dL (ref 1.7–2.4)

## 2024-07-06 MED ORDER — HYDROMORPHONE HCL-NACL 50-0.9 MG/50ML-% IV SOLN
0.5000 mg/h | INTRAVENOUS | Status: DC
  Administered 2024-07-06 – 2024-07-07 (×2): 8 mg/h via INTRAVENOUS
  Administered 2024-07-07: 6 mg/h via INTRAVENOUS
  Administered 2024-07-07: 6.5 mg/h via INTRAVENOUS
  Administered 2024-07-07: 5.5 mg/h via INTRAVENOUS
  Filled 2024-07-06 (×5): qty 50

## 2024-07-06 MED ORDER — MAGNESIUM SULFATE 2 GM/50ML IV SOLN
2.0000 g | Freq: Once | INTRAVENOUS | Status: AC
  Administered 2024-07-06: 2 g via INTRAVENOUS
  Filled 2024-07-06: qty 50

## 2024-07-06 MED ORDER — MIDAZOLAM HCL (PF) 2 MG/2ML IJ SOLN: 4.0000 mg | Freq: Once | INTRAMUSCULAR | Status: AC

## 2024-07-06 MED ORDER — MIDAZOLAM HCL 2 MG/2ML IJ SOLN
INTRAMUSCULAR | Status: AC
  Administered 2024-07-06: 4 mg via INTRAVENOUS
  Filled 2024-07-06: qty 4

## 2024-07-06 MED ORDER — IPRATROPIUM-ALBUTEROL 0.5-2.5 (3) MG/3ML IN SOLN
3.0000 mL | Freq: Three times a day (TID) | RESPIRATORY_TRACT | Status: DC
  Administered 2024-07-06 – 2024-07-12 (×18): 3 mL via RESPIRATORY_TRACT
  Filled 2024-07-06 (×17): qty 3

## 2024-07-06 MED ORDER — FUROSEMIDE 10 MG/ML IJ SOLN
40.0000 mg | Freq: Once | INTRAMUSCULAR | Status: AC
  Administered 2024-07-06: 40 mg via INTRAVENOUS
  Filled 2024-07-06: qty 4

## 2024-07-06 NOTE — Progress Notes (Signed)
 PHARMACY CONSULT NOTE  Pharmacy Consult for Electrolyte Monitoring and Replacement   Recent Labs: Potassium (mmol/L)  Date Value  07/06/2024 4.3  10/05/2013 3.5   Magnesium  (mg/dL)  Date Value  87/76/7974 1.8  01/29/2013 2.1   Calcium  (mg/dL)  Date Value  87/76/7974 8.7 (L)   Calcium , Total (mg/dL)  Date Value  96/75/7984 9.1   Albumin (g/dL)  Date Value  87/76/7974 2.7 (L)  10/05/2013 4.3   Phosphorus (mg/dL)  Date Value  87/76/7974 3.2  07/21/2012 2.1 (L)   Sodium (mmol/L)  Date Value  07/06/2024 142  10/05/2013 135 (L)    Assessment: 43 y.o. female with medical history significant of essential hypertension, seizure disorder, polysubstance abuse, recent use of fentanyl  and heroin, who presents to the hospital with progressively worsening short of breath, hypoxia.  Pharmacy is asked to follow and replace electrolytes while in CCU  Nutrition: Vital AF at 50 mL/hr + FWF 200 mL every 4 hours  Goal of Therapy:  Electrolytes WNL  Plan:  --Mg 1.8, magnesium  sulfate 2 g IV x 1 --Follow-up electrolytes with AM labs tomorrow  [name] Phillips 07/06/2024 8:24 AM

## 2024-07-06 NOTE — Progress Notes (Signed)
 "  NAME:  [name] Phillips, MRN:  [id number], DOB:  11-Aug-1980, LOS: 9 ADMISSION DATE:  06/27/2024  CHIEF COMPLAINT:  severe resp failure, COCAINE ABUSE AND STREP PNEUMONIA  History of Present Illness:   43 year old female with history of polysubstance use (IVDU with fentanyl , last injection 4 days ago) who presents with increased shortness of breath and admitted for management of pneumonia. +COCAINE  Patient with worsening respiratory status with increased work of breathing, worsening hypoxia, and increased tachycardia. With her very severe pneumonia we would need to escalate therapy further with intubation, mechanical ventilation, and consideration for bronchoscopy to clear airway secretions. Discussed with the patient and her husband at bedside and she consents to proceed.   Pertinent Medical History:  Hypertension Seizure Alcohol  Use Polysubstance abuse  Significant Hospital Events: Including procedures, antibiotic start and stop dates in addition to other pertinent events   12/14: admit with right sided pneumonia and respiratory failure. CXR with right lung white out, INTUBATED, S/p BRONCH, ART LINE PLACED 12/14: BLOOD CX +STREP PNEUMONIA 12/15: remains on vent 12/16: remains on vent 12/17: remains on vent, severe hypoxia, CVL placed 12/18: severe Hypoxia s/p BRONCH mucoid secretions, worsening CXR, ABX broadened to cover Pseudomonas and MRSA 12/19: severe hypoxia 12/21: attempt sedation wean, diurese 12/22: Remains critically ill requiring mechanical ventilation, levophed  and multiple sedation agents. Diuresed with Lasix  40mg  yesterday; 5.9L UO. Will attempt at [facility] today and wean vent requirements if able 12/23: Remains critically ill requiring mechanical ventilation. Still on levophed , propofol , dilaudid  and precedex . O2 requirements back to 60%; went up to 100% yesterday d/t ETT progressing into right mainstem and needed retraction. Required versed  early this am for acute agitation  when trying to get a bath. Wean vent settings as able. Will hold of on weaning sedation today as she is awake on current sedation requirements  Micro Data:  12/14 BLOOD CX + STREP PNEUMONIA 12/14 Respiratory Viral Panel 20 Pathogen >> negative 12/14 Aspergillus >> negative 12/14 Histoplasma Antigen >> negative 12/14 Fungitell Beta D Glucan >> negative 12/14 Pneumocystitis smear >> negative 12/14 Fungal Culture >> negative  Antimicrobials:   Antibiotics Given (last 72 hours)     Date/Time Action Medication Dose Rate   07/03/24 0758 New Bag/Given   ceFEPIme  (MAXIPIME ) 2 g in sodium chloride  0.9 % 100 mL IVPB 2 g 200 mL/hr   07/03/24 1101 New Bag/Given   linezolid  (ZYVOX ) IVPB 600 mg 600 mg 300 mL/hr   07/03/24 1629 New Bag/Given   ceFEPIme  (MAXIPIME ) 2 g in sodium chloride  0.9 % 100 mL IVPB 2 g 200 mL/hr   07/03/24 2218 New Bag/Given   linezolid  (ZYVOX ) IVPB 600 mg 600 mg 300 mL/hr   07/03/24 2354 New Bag/Given   ceFEPIme  (MAXIPIME ) 2 g in sodium chloride  0.9 % 100 mL IVPB 2 g 200 mL/hr   07/04/24 0744 New Bag/Given   ceFEPIme  (MAXIPIME ) 2 g in sodium chloride  0.9 % 100 mL IVPB 2 g 200 mL/hr   07/04/24 9075 New Bag/Given   linezolid  (ZYVOX ) IVPB 600 mg 600 mg 300 mL/hr   07/04/24 1600 New Bag/Given   ceFEPIme  (MAXIPIME ) 2 g in sodium chloride  0.9 % 100 mL IVPB 2 g 200 mL/hr   07/04/24 2227 New Bag/Given   linezolid  (ZYVOX ) IVPB 600 mg 600 mg 300 mL/hr   07/05/24 0022 New Bag/Given   ceFEPIme  (MAXIPIME ) 2 g in sodium chloride  0.9 % 100 mL IVPB 2 g 200 mL/hr   07/05/24 9150 New Bag/Given  ceFEPIme  (MAXIPIME ) 2 g in sodium chloride  0.9 % 100 mL IVPB 2 g 200 mL/hr   07/05/24 1045 New Bag/Given   linezolid  (ZYVOX ) IVPB 600 mg 600 mg 300 mL/hr   07/05/24 1609 New Bag/Given   ceFEPIme  (MAXIPIME ) 2 g in sodium chloride  0.9 % 100 mL IVPB 2 g 200 mL/hr   07/05/24 2211 New Bag/Given   linezolid  (ZYVOX ) IVPB 600 mg 600 mg 300 mL/hr   07/05/24 2346 New Bag/Given   ceFEPIme  (MAXIPIME ) 2  g in sodium chloride  0.9 % 100 mL IVPB 2 g 200 mL/hr      Interim History / Subjective:  Remains intubated and sedated on low dose pressors, no other acute overnight events   Vent Mode: PRVC FiO2 (%):  [50 %-100 %] 50 % Set Rate:  [24 bmp] 24 bmp Vt Set:  [3.9 mL-390 mL] 390 mL PEEP:  [8 cmH20-10 cmH20] 10 cmH20 Plateau Pressure:  [21 cmH20-26 cmH20] 21 cmH20   Objective   Blood pressure (!) 115/57, pulse 71, temperature 98.9 F (37.2 C), temperature source Axillary, resp. rate (!) 24, height 5' 4.02 (1.626 m), weight 73.5 kg, last menstrual period 06/08/2024, SpO2 97%.    Vent Mode: PRVC FiO2 (%):  [50 %-100 %] 50 % Set Rate:  [24 bmp] 24 bmp Vt Set:  [3.9 mL-390 mL] 390 mL PEEP:  [8 cmH20-10 cmH20] 10 cmH20 Plateau Pressure:  [21 cmH20-26 cmH20] 21 cmH20   Intake/Output Summary (Last 24 hours) at 07/06/2024 0731 Last data filed at 07/06/2024 0600 Gross per 24 hour  Intake 4239.87 ml  Output 5425 ml  Net -1185.13 ml   Filed Weights   06/27/24 1025 06/27/24 1127 06/27/24 1614  Weight: 73.5 kg 73.5 kg 73.5 kg   Physical Exam: General:  critically ill but well nourished young female, intubated with intermittent agitation HENT: Normocephalic, atraumatic, MM pink/moist, sclera anicteric, supple, no JVD CV: sinus tachycardia, S1 S2, no m/r/g, radial pulses 2+, distal pulses 1+ PULM:  on PRVC, synchronous with vent, no rhonchi or wheezing, diminished in the bilateral bases  GI: soft, non-tender, non-distended, no rebound/guarding, bowel sounds x 4  Extremities: warm/dry, 1+ edema of bilateral upper and lower extremities Neuro: intubated and sedated, opens eyes and tracks, no focal neuro deficits, follows some commands, PERRL   ASSESSMENT AND PLAN  43 yo white female with severe ALI from acute severe bacterial pneumonia (STREP Pneumonia) with bacteremia leading to severe hypoxic respiratory failure and metabolic encephalopathy, leading to emergent intubation  #Acute  Metabolic Encephalopathy #Polysubstance Abuse #Sedation due to mechanical ventilation - Supportive care - PAD protocol - Sedation to maintain RASS goal - Currently on Propofol  + Dilaudid  + Precedex  gtt - WUA daily; wean dilaudid  as able and use precedex  + propofol  - Hold off on weaning sedation today - Discontinue clonidine  - Encourage family support at bedside  #Severe Acute Hypoxic and Hypercapnic Respiratory Failure secondary to Strep Pneumonia  - Full vent support - Wean FiO2 and PEEP as tolerated - VAP protocol - Wean PEEP & FiO2 as tolerated, maintain SpO2 > 88% - SBT once mental and respiratory parameters met - Head of bed elevated 30 degrees - Plateau pressures less than 30 cm H20  - Intermittent chest x-ray & ABG PRN - Ensure adequate pulmonary hygiene  - Duonebs Q6h - Solumedrol IV 20mg  daily - Lasix  40mg  IV yesterday with 5.6L UO; repeat dose this am, repeat BMP and consider afternoon dose - Intermittent CXR and ABG - Tracheostomy likely possible d/t extubation  barriers  #Septic Shock s/t Strep Pneumonia Bacteremia #Prolonged Qtc Interval 12/14 ECHO: LVEF 65-70% with normal function; RV normal size and function; mildly dilated LA; mild mitral valve regurgitation; no AR; IVC normal size with >50% variability - Continuous cardiac monitoring - Vasopressors to maintain MAP >65 - Currently on low requirements of levophed  gtt - Wean vasopressors as able - EKG prn; repeat this am to assess Qtc (431) - Antihypertensives prn - Hydralazine  10mg  Q4h prn SBP>185 or DBP>100, Labetalol  10-20mg  Q2h prn SBP>185 or DBP >100  - Metoprolol  5mg  Q6h for sustained tachycardia >130  #AKI secondary to Septic Shock #Hypernatremia - Strict I/O - Trend BMP and monitor renal function - Lasix  40mg  IV x 1 this am and recheck BMP this afternoon - Diurese as renal function permits - Avoid nephrotoxins as able - Ensure adequate renal perfusion - Removed foley overnight - Renal dose  medications - Continue free water  per tube, Q4h - ICU electrolyte replacement protocol - Pharmacy to assist with replacement as indicated  #GI Prophylaxis - Constipation protocol prn - Diet: tube feeds - Pepcid  40mg  daily per tube  #Acute Anemia #DVT Prophylaxis - Trend CBC - Monitor coags prn - Transfuse if Hgb < 7 or platelets <10 - Monitor for s/sx of bleeding - Switched from SQ heparin  to Lovenox  on 12/22  #Hyperglycemia  - ICU hypo/hyperglycemia protocol - Goal range 140-180 - Lantus  10 units - Novolog  - Remains on Solumedrol 20mg  daily for 2 more doses  #Strep Pneumonia Bacteremia - Trend WBC and monitor fever curve - Tylenol  for fevers prn - Complete 7 day course cefepime  and linezolid  - Scheduled to start Ceftriaxone  course on 12/25 - BAL negative - RVP negative  12/23: Updated husband at bedside regarding current status and plan of care  Best practice (right click and Reselect all SmartList Selections daily)  Diet:  NPO; tube feeds Pain/Anxiety/Delirium protocol (if indicated): Yes (RASS goal -2) VAP protocol (if indicated): Yes DVT prophylaxis: Lovenox  GI prophylaxis: H2B Central venous access:  Left IJV CVC Arterial line:  Yes, and it is still needed Foley:  no, removed 12/22 Mobility:  bed rest  Code Status:  FULL CODE Disposition: ICU  Labs   CBC: Recent Labs  Lab 07/02/24 0500 07/03/24 0502 07/04/24 0416 07/05/24 0400 07/06/24 0245  WBC 19.9* 18.5* 32.9* 26.3* 22.4*  HGB 7.5* 8.8* 8.9* 8.5* 8.2*  HCT 24.6* 27.8* 29.0* 26.9* 25.6*  MCV 96.5 94.9 98.3 96.1 95.9  PLT 217 218 416* 415* 407*    Basic Metabolic Panel: Recent Labs  Lab 07/02/24 0500 07/03/24 0502 07/04/24 0416 07/05/24 0400 07/05/24 1720 07/06/24 0245  NA 150* 143 142 143 144 142  K 3.9 4.2 4.7 3.0* 4.2 4.3  CL 107 103 106 105 104 106  CO2 34* 32 28 29 30 29   GLUCOSE 278* 269* 229* 163* 184* 147*  BUN 28* 25* 28* 25* 26* 22*  CREATININE 0.59 0.39* 0.39*  0.34* 0.45 0.37*  CALCIUM  8.4* 8.9 8.9 8.4* 8.7* 8.7*  MG 1.6* 1.7 1.8 1.7 2.0 1.8  PHOS 3.2 3.6 4.3 2.8  --  3.2   GFR: Estimated Creatinine Clearance: 87.2 mL/min (A) (by C-G formula based on SCr of 0.37 mg/dL (L)). Recent Labs  Lab 07/03/24 0502 07/04/24 0416 07/05/24 0400 07/06/24 0245  WBC 18.5* 32.9* 26.3* 22.4*    Liver Function Tests: Recent Labs  Lab 07/02/24 0500 07/03/24 0502 07/04/24 0416 07/05/24 0400 07/06/24 0245  ALBUMIN 2.6* 2.7* 2.6* 2.5* 2.7*  No results for input(s): LIPASE, AMYLASE in the last 168 hours. No results for input(s): AMMONIA in the last 168 hours.  ABG    Component Value Date/Time   PHART 7.5 (H) 07/04/2024 1242   PCO2ART 43 07/04/2024 1242   PO2ART 63 (L) 07/04/2024 1242   HCO3 33.5 (H) 07/04/2024 1242   ACIDBASEDEF 3.2 (H) 06/28/2024 0544   O2SAT 95.8 07/04/2024 1242     Coagulation Profile: No results for input(s): INR, PROTIME in the last 168 hours.  Cardiac Enzymes: No results for input(s): CKTOTAL, CKMB, CKMBINDEX, TROPONINI in the last 168 hours.  HbA1C: Hemoglobin A1C  Date/Time Value Ref Range Status  07/21/2012 04:48 AM 4.8 4.2 - 6.3 % Final    Comment:    The American Diabetes Association recommends that a primary goal of therapy should be <7% and that physicians should reevaluate the treatment regimen in patients with HbA1c values consistently >8%.    Hgb A1c MFr Bld  Date/Time Value Ref Range Status  06/29/2024 05:04 AM 6.0 (H) 4.8 - 5.6 % Final    Comment:    (NOTE) Diagnosis of Diabetes The following HbA1c ranges recommended by the American Diabetes Association (ADA) may be used as an aid in the diagnosis of diabetes mellitus.  Hemoglobin             Suggested A1C NGSP%              Diagnosis  <5.7                   Non Diabetic  5.7-6.4                Pre-Diabetic  >6.4                   Diabetic  <7.0                   Glycemic control for                       adults with  diabetes.      CBG: Recent Labs  Lab 07/05/24 1125 07/05/24 1548 07/05/24 1918 07/05/24 2313 07/06/24 0330  GLUCAP 140* 177* 135* 161* 138*    Allergies Allergies[1]   DVT/GI PRX assessed I Assessed the need for Labs I Assessed the need for Foley I Assessed the need for Central Venous Line Family Discussion when available I Assessed the need for Mobilization I made an Assessment of medications to be adjusted accordingly Safety Risk assessment completed  CASE DISCUSSED IN MULTIDISCIPLINARY ROUNDS WITH ICU TEAM   CRITICAL CARE Performed by: Robet Kim   Total critical care time: 55 minutes  Robet Kim, PA-C [place] [facility] PCCM Team Contact Info: [phone]                  [1]  Allergies Allergen Reactions   Varenicline  Tartrate Other (See Comments)    varenicline    Wellbutrin  [Bupropion ]     Suicidal thoughts    "

## 2024-07-06 NOTE — Plan of Care (Signed)
" °  Problem: Clinical Measurements: Goal: Will remain free from infection Outcome: Progressing Goal: Respiratory complications will improve Outcome: Progressing Goal: Cardiovascular complication will be avoided Outcome: Progressing   Problem: Nutrition: Goal: Adequate nutrition will be maintained Outcome: Progressing   Problem: Elimination: Goal: Will not experience complications related to bowel motility Outcome: Progressing Goal: Will not experience complications related to urinary retention Outcome: Progressing   Problem: Pain Managment: Goal: General experience of comfort will improve and/or be controlled Outcome: Progressing   Problem: Safety: Goal: Ability to remain free from injury will improve Outcome: Progressing   Problem: Education: Goal: Knowledge of General Education information will improve Description: Including pain rating scale, medication(s)/side effects and non-pharmacologic comfort measures Outcome: Not Progressing   Problem: Health Behavior/Discharge Planning: Goal: Ability to manage health-related needs will improve Outcome: Not Progressing   Problem: Clinical Measurements: Goal: Ability to maintain clinical measurements within normal limits will improve Outcome: Not Progressing Goal: Diagnostic test results will improve Outcome: Not Progressing   Problem: Activity: Goal: Risk for activity intolerance will decrease Outcome: Not Progressing   Problem: Coping: Goal: Level of anxiety will decrease Outcome: Not Progressing   "

## 2024-07-06 NOTE — TOC Initial Note (Signed)
 Transition of Care [facility]) - Initial/Assessment Note    Patient Details  Name: [name] Phillips MRN: [id number] Date of Birth: [birth year]  [facility]) CM/SW Contact:    Corrie JINNY Ruts, LCSW Phone Number: 07/06/2024, 3:19 PM  Clinical Narrative:                 Chart reviewed. The patient is intubated. I was able to speak with the patient husband via telephone. I introduced myself, my role, and reason for consult. Per chart the patient PCP is Ginger Patrick. The patient husband reports that the patient lives in the home with him and nephew who has special needs.  The patient husband reports that the patient was able to complete task independently and drove herself to medical appointments. The patient husband reports that he will assist the patient at D/C.  The patient husband reports that the [facility] uses [facility]. The patient husband reports that the patient has never had HH or been admitted into a Snf. The patient husband reports that the patient does not have any equipment in the home.         Patient Goals and CMS Choice            Expected Discharge Plan and Services                                              Prior Living Arrangements/Services                       Activities of Daily Living   ADL Screening (condition at time of admission) Independently performs ADLs?: Yes (appropriate for developmental age) Is the patient deaf or have difficulty hearing?: No Does the patient have difficulty seeing, even when wearing glasses/contacts?: No Does the patient have difficulty concentrating, remembering, or making decisions?: No  Permission Sought/Granted                  Emotional Assessment              Admission diagnosis:  Hypoxia [R09.02] AKI (acute kidney injury) [N17.9] Elevated LDL cholesterol level [E78.00] Acute hypoxemic respiratory failure (HCC) [J96.01] Pneumonia of both lungs due to infectious organism,  unspecified part of lung [J18.9] Patient Active Problem List   Diagnosis Date Noted   Streptococcal pneumonia 07/05/2024   Bacteremia due to Streptococcus pneumoniae 07/05/2024   Acute hypoxemic respiratory failure (HCC) 06/27/2024   Severe sepsis (HCC) 06/27/2024   Pneumonia 06/27/2024   AKI (acute kidney injury) 06/27/2024   Hyponatremia 06/27/2024   Hypokalemia 06/27/2024   Lack of concentration 10/29/2023   Gastroesophageal reflux disease with esophagitis without hemorrhage 10/29/2023   Overweight (BMI 25.0-29.9) 10/29/2023   Elevated LDL cholesterol level 02/25/2022   Rosacea 02/25/2022   Toenail fungus 02/25/2022   Hyperglycemia 10/12/2021   Hepatic steatosis 08/17/2021   Hyperlipidemia 08/17/2021   Hyperhidrosis 07/13/2021   Polyarthralgia 06/13/2021   Elevated liver function tests 06/13/2021   Opioid abuse, in remission (HCC) 06/12/2021   Carpal tunnel syndrome, right 03/23/2021   Insomnia 10/18/2020   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 [place] [address] Phone: [phone] Fax: [phone]     Social Drivers of Health (SDOH) Social History: SDOH Screenings   Food Insecurity: No Food Insecurity (06/27/2024)  Housing: Low Risk (06/27/2024)  Transportation Needs: No Transportation Needs (06/27/2024)  Utilities: Not At Risk (06/27/2024)  Depression (PHQ2-9): High Risk (10/29/2023)  Tobacco Use: Medium Risk (06/27/2024)   SDOH Interventions:     Readmission Risk Interventions    07/06/2024    3:19 PM  Readmission Risk Prevention Plan  Transportation Screening Complete  PCP or  Specialist Appt within 3-5 Days Complete  HRI or Home Care Consult Complete  Social Work Consult for Recovery Care Planning/Counseling Complete  Palliative Care Screening Not Applicable  Medication Review [employer]) Complete

## 2024-07-06 NOTE — TOC Progression Note (Signed)
 Transition of Care [facility]) - Progression Note    Patient Details  Name: [name] Phillips MRN: [id number] Date of Birth: Oct 17, 1980  Transition [facility]) CM/SW Contact  K'[place] JINNY Ruts, LCSW Phone Number: 07/06/2024, 2:17 PM  Clinical Narrative:    Chart reviewed. The patient is intubated. Called the patient spouse and left a VM. Waitng for spouse to return call.                      Expected Discharge Plan and Services                                               Social Drivers of Health (SDOH) Interventions SDOH Screenings   Food Insecurity: No Food Insecurity (06/27/2024)  Housing: Low Risk (06/27/2024)  Transportation Needs: No Transportation Needs (06/27/2024)  Utilities: Not At Risk (06/27/2024)  Depression (PHQ2-9): High Risk (10/29/2023)  Tobacco Use: Medium Risk (06/27/2024)    Readmission Risk Interventions     No data to display

## 2024-07-07 ENCOUNTER — Inpatient Hospital Stay

## 2024-07-07 ENCOUNTER — Other Ambulatory Visit: Payer: Self-pay

## 2024-07-07 DIAGNOSIS — R6521 Severe sepsis with septic shock: Secondary | ICD-10-CM | POA: Diagnosis not present

## 2024-07-07 DIAGNOSIS — J154 Pneumonia due to other streptococci: Secondary | ICD-10-CM | POA: Diagnosis not present

## 2024-07-07 DIAGNOSIS — J9601 Acute respiratory failure with hypoxia: Secondary | ICD-10-CM | POA: Diagnosis not present

## 2024-07-07 DIAGNOSIS — A419 Sepsis, unspecified organism: Secondary | ICD-10-CM | POA: Diagnosis not present

## 2024-07-07 LAB — RENAL FUNCTION PANEL
Albumin: 2.7 g/dL — ABNORMAL LOW (ref 3.5–5.0)
Anion gap: 9 (ref 5–15)
BUN: 23 mg/dL — ABNORMAL HIGH (ref 6–20)
CO2: 28 mmol/L (ref 22–32)
Calcium: 9.1 mg/dL (ref 8.9–10.3)
Chloride: 104 mmol/L (ref 98–111)
Creatinine, Ser: 0.31 mg/dL — ABNORMAL LOW (ref 0.44–1.00)
GFR, Estimated: >60 mL/min
Glucose, Bld: 87 mg/dL (ref 70–99)
Phosphorus: 3.9 mg/dL (ref 2.5–4.6)
Potassium: 3.8 mmol/L (ref 3.5–5.1)
Sodium: 141 mmol/L (ref 135–145)

## 2024-07-07 LAB — BASIC METABOLIC PANEL WITH GFR
Anion gap: 12 (ref 5–15)
Anion gap: 11 (ref 5–15)
BUN: 24 mg/dL — ABNORMAL HIGH (ref 6–20)
BUN: 22 mg/dL — ABNORMAL HIGH (ref 6–20)
CO2: 27 mmol/L (ref 22–32)
CO2: 29 mmol/L (ref 22–32)
Calcium: 9.4 mg/dL (ref 8.9–10.3)
Calcium: 8.8 mg/dL — ABNORMAL LOW (ref 8.9–10.3)
Chloride: 102 mmol/L (ref 98–111)
Chloride: 101 mmol/L (ref 98–111)
Creatinine, Ser: 0.48 mg/dL (ref 0.44–1.00)
Creatinine, Ser: 0.35 mg/dL — ABNORMAL LOW (ref 0.44–1.00)
GFR, Estimated: >60 mL/min
GFR, Estimated: >60 mL/min
Glucose, Bld: 181 mg/dL — ABNORMAL HIGH (ref 70–99)
Glucose, Bld: 169 mg/dL — ABNORMAL HIGH (ref 70–99)
Potassium: 4.4 mmol/L (ref 3.5–5.1)
Potassium: 3.7 mmol/L (ref 3.5–5.1)
Sodium: 141 mmol/L (ref 135–145)
Sodium: 141 mmol/L (ref 135–145)

## 2024-07-07 LAB — CBC
HCT: 25.3 % — ABNORMAL LOW (ref 36.0–46.0)
Hemoglobin: 8 g/dL — ABNORMAL LOW (ref 12.0–15.0)
MCH: 29.9 pg (ref 26.0–34.0)
MCHC: 31.6 g/dL (ref 30.0–36.0)
MCV: 94.4 fL (ref 80.0–100.0)
Platelets: 425 K/uL — ABNORMAL HIGH (ref 150–400)
RBC: 2.68 MIL/uL — ABNORMAL LOW (ref 3.87–5.11)
RDW: 14.7 % (ref 11.5–15.5)
WBC: 19 K/uL — ABNORMAL HIGH (ref 4.0–10.5)
nRBC: 0 % (ref 0.0–0.2)

## 2024-07-07 LAB — GLUCOSE, CAPILLARY
Glucose-Capillary: 179 mg/dL — ABNORMAL HIGH (ref 70–99)
Glucose-Capillary: 116 mg/dL — ABNORMAL HIGH (ref 70–99)
Glucose-Capillary: 139 mg/dL — ABNORMAL HIGH (ref 70–99)
Glucose-Capillary: 152 mg/dL — ABNORMAL HIGH (ref 70–99)
Glucose-Capillary: 176 mg/dL — ABNORMAL HIGH (ref 70–99)
Glucose-Capillary: 118 mg/dL — ABNORMAL HIGH (ref 70–99)

## 2024-07-07 LAB — MAGNESIUM: Magnesium: 1.9 mg/dL (ref 1.7–2.4)

## 2024-07-07 LAB — TRIGLYCERIDES: Triglycerides: 148 mg/dL

## 2024-07-07 MED ORDER — FUROSEMIDE 10 MG/ML IJ SOLN
40.0000 mg | Freq: Once | INTRAMUSCULAR | Status: AC
  Administered 2024-07-07: 40 mg via INTRAVENOUS
  Filled 2024-07-07: qty 4

## 2024-07-07 MED ORDER — SODIUM CHLORIDE 0.9% FLUSH: 10.0000 mL | INTRAVENOUS | Status: DC | PRN

## 2024-07-07 MED ORDER — SODIUM CHLORIDE 0.9% FLUSH
10.0000 mL | Freq: Two times a day (BID) | INTRAVENOUS | Status: DC
  Administered 2024-07-07: 10 mL
  Administered 2024-07-07: 30 mL
  Administered 2024-07-08 (×2): 10 mL
  Administered 2024-07-09: 20 mL
  Administered 2024-07-09: 10 mL
  Administered 2024-07-10: 20 mL
  Administered 2024-07-10 – 2024-07-11 (×2): 10 mL
  Administered 2024-07-11: 30 mL
  Administered 2024-07-12 – 2024-07-15 (×7): 10 mL

## 2024-07-07 MED ORDER — FREE WATER
200.0000 mL | Freq: Four times a day (QID) | Status: DC
  Administered 2024-07-07 – 2024-07-08 (×4): 200 mL

## 2024-07-07 MED ORDER — POTASSIUM CHLORIDE 20 MEQ PO PACK
40.0000 meq | PACK | Freq: Once | ORAL | Status: AC
  Administered 2024-07-07: 40 meq
  Filled 2024-07-07: qty 2

## 2024-07-07 MED ORDER — POTASSIUM CHLORIDE 20 MEQ PO PACK
20.0000 meq | PACK | Freq: Once | ORAL | Status: DC
  Filled 2024-07-07: qty 1

## 2024-07-07 MED ORDER — MAGNESIUM SULFATE 2 GM/50ML IV SOLN
2.0000 g | Freq: Once | INTRAVENOUS | Status: AC
  Administered 2024-07-07: 2 g via INTRAVENOUS
  Filled 2024-07-07: qty 50

## 2024-07-07 NOTE — Progress Notes (Signed)
   Inpatient Rehab Admissions Coordinator :  Per therapy recommendations patient was screened for CIR candidacy by Ottie Glazier RN MSN. Patient is not yet at a level to tolerate the intensity required to pursue a CIR admit. Vented. The CIR admissions team will follow and monitor for progress and place a Rehab Consult order if felt to be appropriate. Please contact me with any questions.  Ottie Glazier RN MSN Admissions Coordinator [phone]

## 2024-07-07 NOTE — Progress Notes (Signed)
 PHARMACY CONSULT NOTE  Pharmacy Consult for Electrolyte Monitoring and Replacement   Recent Labs: Potassium (mmol/L)  Date Value  07/07/2024 3.8  10/05/2013 3.5   Magnesium  (mg/dL)  Date Value  87/75/7974 1.9  01/29/2013 2.1   Calcium  (mg/dL)  Date Value  87/75/7974 9.1   Calcium , Total (mg/dL)  Date Value  96/75/7984 9.1   Albumin (g/dL)  Date Value  87/75/7974 2.7 (L)  10/05/2013 4.3   Phosphorus (mg/dL)  Date Value  87/75/7974 3.9  07/21/2012 2.1 (L)   Sodium (mmol/L)  Date Value  07/07/2024 141  10/05/2013 135 (L)    Assessment: 43 y.o. female with medical history significant of essential hypertension, seizure disorder, polysubstance abuse, recent use of fentanyl  and heroin, who presents to the hospital with progressively worsening short of breath, hypoxia.  Pharmacy is asked to follow and replace electrolytes while in CCU  Nutrition: Vital AF at 50 mL/hr + FWF 200 mL every 4 hours  Goal of Therapy:  Electrolytes WNL  Plan:  --K 3.8, Kcl 20 mEq per tube x 1 dose --Mg 1.9, magnesium  sulfate 2 g IV x 1 --Follow-up electrolytes with AM labs tomorrow  Marolyn KATHEE Mare 07/07/2024 8:05 AM

## 2024-07-07 NOTE — Progress Notes (Signed)
 Peripherally Inserted Central Catheter Placement  The IV Nurse has discussed with the patient and/or persons authorized to consent for the patient, the purpose of this procedure and the potential benefits and risks involved with this procedure.  The benefits include less needle sticks, lab draws from the catheter, and the patient may be discharged home with the catheter. Risks include, but not limited to, infection, bleeding, blood clot (thrombus formation), and puncture of an artery; nerve damage and irregular heartbeat and possibility to perform a PICC exchange if needed/ordered by physician.  Alternatives to this procedure were also discussed.  Bard Power PICC patient education guide, fact sheet on infection prevention and patient information card has been provided to patient /or left at bedside.    PICC Placement Documentation  PICC Triple Lumen 07/07/24 Right Brachial 39 cm 1 cm (Active)  Indication for Insertion or Continuance of Line Vasoactive infusions 07/07/24 1425  Exposed Catheter (cm) 1 cm 07/07/24 1425  Site Assessment Clean, Dry, Intact 07/07/24 1425  Lumen #1 Status Flushed;Saline locked;Blood return noted 07/07/24 1425  Lumen #2 Status Flushed;Saline locked;Blood return noted 07/07/24 1425  Lumen #3 Status Flushed;Saline locked;Blood return noted 07/07/24 1425  Dressing Type Transparent;Securing device 07/07/24 1425  Dressing Status Antimicrobial disc/dressing in place;Clean, Dry, Intact 07/07/24 1425  Line Care Connections checked and tightened 07/07/24 1425  Line Adjustment (NICU/IV Team Only) No 07/07/24 1425  Dressing Intervention New dressing;Adhesive placed at insertion site (IV team only) 07/07/24 1425  Dressing Change Due 07/14/24 07/07/24 1425    Telephone consent by husband witnessed by another RN   [name] 07/07/2024, 2:50 PM

## 2024-07-07 NOTE — Consult Note (Signed)
 Kushi, Kun [id number] 1980-09-02  Reason for Consult: Prolonged intubation Requesting Physician:  Isadora Hose, MD   HPI: The patient is a 43 year old white female with a history of polysubstance abuse and last injection 4 days prior to admission.  She presented with acute shortness of breath admitted for management of pneumonia with positive cocaine in her blood.  She had worsening respiratory status with increased work of breathing, worsening hypoxia, and increased tachycardia.  She had complete whiteout of the right lung.  She required intubation and mechanical ventilation on 12/14.  She had bronchoscopy for helping to clear out the right lung.  She had a showed septicemia from strep and was in ICU.  She had repeat bronchoscopy done on 12/18 because of worsening chest x-ray while on broad-spectrum antibiotics.  She improved, but failed a weaning trial a few days later.  She still requiring higher oxygen saturations.  Because of the the severity of her disease consultation was placed for possible tracheostomy.  ROS:  Negative except as in HPI.  Past Medical History:  Diagnosis Date   Alcohol  abuse 11/02/2018   Hypertension    Seizure [facility])    sees dr maree at [facility], no longer has them. 9 years ago.    Allergies[1]  Scheduled Medications:  atorvastatin   10 mg Per Tube Daily   Chlorhexidine  Gluconate Cloth  6 each Topical Q0600   cholecalciferol   2,000 Units Per Tube Daily   docusate  100 mg Per Tube BID   enoxaparin  (LOVENOX ) injection  40 mg Subcutaneous Daily   famotidine   40 mg Per Tube Daily   feeding supplement (PROSource TF20)  60 mL Per Tube Daily   folic acid   1 mg Per Tube Daily   free water   200 mL Per Tube Q4H   insulin  aspart  0-20 Units Subcutaneous Q4H   insulin  glargine  10 Units Subcutaneous Daily   ipratropium-albuterol   3 mL Nebulization TID   methylPREDNISolone  (SOLU-MEDROL ) injection  20 mg Intravenous Daily   mouth rinse  15 mL Mouth Rinse Q2H    polyethylene glycol  17 g Per Tube Daily   potassium chloride   20 mEq Per Tube Once   thiamine   100 mg Per Tube Daily    PRN Meds: acetaminophen , hydrALAZINE , labetalol , metoprolol  tartrate  Infusions:  ceFEPime  (MAXIPIME ) IV 2 g (07/07/24 0752)   [START ON 07/08/2024] cefTRIAXone  (ROCEPHIN )  IV     dexmedetomidine  (PRECEDEX ) IV infusion 1 mcg/kg/hr (07/07/24 0650)   feeding supplement (VITAL AF 1.2 CAL) 50 mL/hr at 07/07/24 0600   HYDROmorphone  6.5 mg/hr (07/07/24 0753)   linezolid  (ZYVOX ) IV Stopped (07/06/24 2325)   magnesium  sulfate bolus IVPB     norepinephrine  (LEVOPHED ) Adult infusion 4 mcg/min (07/07/24 0539)   propofol  (DIPRIVAN ) infusion 40 mcg/kg/min (07/07/24 0617)     Physical Exam:  Vitals:   07/07/24 0600 07/07/24 0751  BP:    Pulse: 67   Resp: (!) 24   Temp: (!) 96.6 F (35.9 C)   SpO2: 95% 93%    The patient is awake and understanding or doing and is not happy about an idea of a tracheostomy.  Her neck is relatively thin with good landmarks and no swelling or inflammation here.  She is orally intubated and requiring sedation.  IMPRESSION: Patient has severe pneumonia secondary to strep with septicemia.  She is improving some but slowly.  I have spoken with the pulmonologist who is going to work aggressively towards weaning over the next 3 to 4 days  and see if they can potentially get her extubated.  Monday of next week will be 2 weeks of intubation and certainly a consideration of tracheostomy.  The husband and patient prefer not to if she can be extubated.  We we will try to tentatively set up some timing for Monday morning but if she can continue to wean and looks like she can be by the weekend or early next week then we can hold off with this and consider doing it after the first of the year instead  PLAN:   Will try to set aside some time Monday if possible for tracheostomy if needed.  Will need to communicate with pulmonology over the weekend to see how she  is doing and if we need to proceed with tracheostomy or not.    Deward DEL Illana Nolting 07/07/2024, 8:18 AM        [1]  Allergies Allergen Reactions   Varenicline  Tartrate Other (See Comments)    varenicline    Wellbutrin  [Bupropion ]     Suicidal thoughts

## 2024-07-07 NOTE — Evaluation (Signed)
 Occupational Therapy Evaluation Patient Details Name: [name] Phillips MRN: [id number] DOB: 1981/03/18 Today's Date: 07/07/2024   History of Present Illness   Patient is a 43 year old female with acute hypoxic respiratory failure, severe strep pneumo pneumonia, strep pneumo bacteremia, septic shock. History of polysubstance and IV drug use, on suboxone  prior to presentation     Clinical Impressions Patient presenting with decreased Ind in self care,balance, functional mobility, transfers, endurance, and safety awareness. Patient's mother in law present on evaluation to confirm baseline. Pt lives at home with spouse and is Ind at baseline. She works as a production designer, theatre/television/film at an apartment complex.  Patient is alert , awake, and follows commands while intubated. She nods head and gestures as well as mouthing to communicate during session. She tolerates HOB being at 60 degrees but does have some pain from fecal system placement. She participate in AROM and PROM exercises in B UEs and LEs. She did well this session and spoke to nursing about placing her in chair position multiple times during the day as long as pt is tolerating. Patient will benefit from acute OT to increase overall independence in the areas of ADLs, functional mobility, and safety awareness in order to safely discharge.      Functional Status Assessment   Patient has had a recent decline in their functional status and demonstrates the ability to make significant improvements in function in a reasonable and predictable amount of time.     Equipment Recommendations   Other (comment) (defer)     Recommendations for Other Services   Rehab consult     Precautions/Restrictions   Precautions Precautions: Fall Recall of Precautions/Restrictions: Impaired     Mobility Bed Mobility                    Transfers                              ADL either performed or assessed with clinical judgement   ADL  Overall ADL's : Needs assistance/impaired                                       General ADL Comments: anticipate heavy assistance needed for all self care tasks secondary to weakness and fatigue.     Vision Patient Visual Report: No change from baseline              Pertinent Vitals/Pain Pain Assessment Pain Assessment: Faces Faces Pain Scale: Hurts little more Pain Location: buttock where FMS is in place Pain Descriptors / Indicators: Discomfort Pain Intervention(s): Repositioned     Extremity/Trunk Assessment Upper Extremity Assessment Upper Extremity Assessment: Generalized weakness   Lower Extremity Assessment Lower Extremity Assessment: Generalized weakness       Communication Communication Communication: No apparent difficulties   Cognition Arousal: Alert Behavior During Therapy: WFL for tasks assessed/performed                                 Following commands: Impaired Following commands impaired: Follows one step commands with increased time     Cueing  General Comments   Cueing Techniques: Verbal cues  patient is able to hold head upright for brief periods without posterior neck support while head of bed is elevated. PEEP 8, Fi02 45%.  vitals monitored throughout session. Sp02 93%           Home Living Family/patient expects to be discharged to:: Private residence Living Arrangements: Spouse/significant other Available Help at Discharge: Family Type of Home: House Home Access: Stairs to enter Secretary/administrator of Steps: 3   Home Layout: One level                          Prior Functioning/Environment Prior Level of Function : Independent/Modified Independent;Working/employed             Mobility Comments: manages an apartment complex. 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    OT Problem List: Decreased strength;Decreased safety awareness;Decreased  activity tolerance;Impaired balance (sitting and/or standing);Decreased knowledge of precautions;Cardiopulmonary status limiting activity   OT Treatment/Interventions: Self-care/ADL training;Therapeutic activities;Therapeutic exercise;Energy conservation;Patient/family education;Balance training;DME and/or AE instruction      OT Goals(Current goals can be found in the care plan section)   Acute Rehab OT Goals Patient Stated Goal: to get stronger OT Goal Formulation: With patient/family Time For Goal Achievement: 08/04/24 Potential to Achieve Goals: Fair ADL Goals Pt Will Perform Grooming: sitting;with min assist Pt Will Transfer to Toilet: bedside commode;squat pivot transfer;with max assist Pt/caregiver will Perform Home Exercise Program: Both right and left upper extremity;With minimal assist   OT Frequency:  Min 2X/week    Co-evaluation PT/OT/SLP Co-Evaluation/Treatment: Yes Reason for Co-Treatment: Complexity of the patient's impairments (multi-system involvement) PT goals addressed during session: Mobility/safety with mobility OT goals addressed during session: ADL's and self-care      AM-PAC OT 6 Clicks Daily Activity     Outcome Measure Help from another person eating meals?: Total Help from another person taking care of personal grooming?: Total Help from another person toileting, which includes using toliet, bedpan, or urinal?: Total Help from another person bathing (including washing, rinsing, drying)?: Total Help from another person to put on and taking off regular upper body clothing?: Total Help from another person to put on and taking off regular lower body clothing?: Total 6 Click Score: 6   End of Session Nurse Communication: Mobility status  Activity Tolerance: Patient tolerated treatment well Patient left: in bed;with call bell/phone within reach;with bed alarm set;with family/visitor present  OT Visit Diagnosis: Unsteadiness on feet (R26.81);Repeated  falls (R29.6);Muscle weakness (generalized) (M62.81)                Time: 8848-8789 OT Time Calculation (min): 19 min Charges:  OT General Charges $OT Visit: 1 Visit OT Evaluation $OT Eval High Complexity: 1 High  [address], MS, OTR/L , CBIS ascom [phone]  07/07/2024, 2:36 PM

## 2024-07-07 NOTE — Plan of Care (Signed)
" °  Problem: Education: Goal: Knowledge of General Education information will improve Description: Including pain rating scale, medication(s)/side effects and non-pharmacologic comfort measures Outcome: Not Progressing   Problem: Health Behavior/Discharge Planning: Goal: Ability to manage health-related needs will improve Outcome: Not Progressing   Problem: Clinical Measurements: Goal: Ability to maintain clinical measurements within normal limits will improve Outcome: Not Progressing Goal: Will remain free from infection Outcome: Not Progressing Goal: Diagnostic test results will improve Outcome: Not Progressing Goal: Respiratory complications will improve Outcome: Not Progressing Goal: Cardiovascular complication will be avoided Outcome: Not Progressing   Problem: Activity: Goal: Risk for activity intolerance will decrease Outcome: Not Progressing   Problem: Nutrition: Goal: Adequate nutrition will be maintained Outcome: Not Progressing   Problem: Coping: Goal: Level of anxiety will decrease Outcome: Not Progressing   Problem: Elimination: Goal: Will not experience complications related to bowel motility Outcome: Not Progressing Goal: Will not experience complications related to urinary retention Outcome: Not Progressing   Problem: Pain Managment: Goal: General experience of comfort will improve and/or be controlled Outcome: Not Progressing   Problem: Safety: Goal: Ability to remain free from injury will improve Outcome: Not Progressing   Problem: Skin Integrity: Goal: Risk for impaired skin integrity will decrease Outcome: Not Progressing   Problem: Activity: Goal: Ability to tolerate increased activity will improve Outcome: Not Progressing   Problem: Clinical Measurements: Goal: Ability to maintain a body temperature in the normal range will improve Outcome: Not Progressing   Problem: Respiratory: Goal: Ability to maintain adequate ventilation will  improve Outcome: Not Progressing Goal: Ability to maintain a clear airway will improve Outcome: Not Progressing   Problem: Activity: Goal: Ability to tolerate increased activity will improve Outcome: Not Progressing   Problem: Respiratory: Goal: Ability to maintain a clear airway and adequate ventilation will improve Outcome: Not Progressing   Problem: Role Relationship: Goal: Method of communication will improve Outcome: Not Progressing   Problem: Education: Goal: Ability to describe self-care measures that may prevent or decrease complications (Diabetes Survival Skills Education) will improve Outcome: Not Progressing   Problem: Coping: Goal: Ability to adjust to condition or change in health will improve Outcome: Not Progressing   Problem: Fluid Volume: Goal: Ability to maintain a balanced intake and output will improve Outcome: Not Progressing   Problem: Health Behavior/Discharge Planning: Goal: Ability to identify and utilize available resources and services will improve Outcome: Not Progressing Goal: Ability to manage health-related needs will improve Outcome: Not Progressing   Problem: Metabolic: Goal: Ability to maintain appropriate glucose levels will improve Outcome: Not Progressing   Problem: Nutritional: Goal: Maintenance of adequate nutrition will improve Outcome: Not Progressing Goal: Progress toward achieving an optimal weight will improve Outcome: Not Progressing   Problem: Skin Integrity: Goal: Risk for impaired skin integrity will decrease Outcome: Not Progressing   Problem: Tissue Perfusion: Goal: Adequacy of tissue perfusion will improve Outcome: Not Progressing   "

## 2024-07-07 NOTE — Progress Notes (Signed)
 "  NAME:  [name] Phillips, MRN:  [id number], DOB:  [birth year], LOS: 10 ADMISSION DATE:  06/27/2024  CHIEF COMPLAINT:  severe resp failure, COCAINE ABUSE AND STREP PNEUMONIA  History of Present Illness:   43 year old female with history of polysubstance use (IVDU with fentanyl , last injection 4 days ago) who presents with increased shortness of breath and admitted for management of pneumonia. +COCAINE  Patient with worsening respiratory status with increased work of breathing, worsening hypoxia, and increased tachycardia. With her very severe pneumonia we would need to escalate therapy further with intubation, mechanical ventilation, and consideration for bronchoscopy to clear airway secretions. Discussed with the patient and her husband at bedside and she consents to proceed.   Pertinent Medical History:  Hypertension Seizure Alcohol  Use Polysubstance abuse  Significant Hospital Events: Including procedures, antibiotic start and stop dates in addition to other pertinent events   12/14: admit with right sided pneumonia and respiratory failure. CXR with right lung white out, INTUBATED, S/p BRONCH, ART LINE PLACED 12/14: BLOOD CX +STREP PNEUMONIA 12/15: remains on vent 12/16: remains on vent 12/17: remains on vent, severe hypoxia, CVL placed 12/18: severe Hypoxia s/p BRONCH mucoid secretions, worsening CXR, ABX broadened to cover Pseudomonas and MRSA 12/19: severe hypoxia 12/21: attempt sedation wean, diurese 12/22: Remains critically ill requiring mechanical ventilation, levophed  and multiple sedation agents. Diuresed with Lasix  40mg  yesterday; 5.9L UO. Will attempt at [facility] today and wean vent requirements if able 12/23: Remains critically ill requiring mechanical ventilation. Still on levophed , propofol , dilaudid  and precedex . O2 requirements back to 60%; went up to 100% yesterday d/t ETT progressing into right mainstem and needed retraction. Required versed  early this am for acute  agitation when trying to get a bath. Wean vent settings as able. Will hold of on weaning sedation today as she is awake on current sedation requirements 12/24: Remains critically ill requiring mechanical ventilation. Low pressor requirements and decreasing sedation needs. Wean vent settings as able. Awake and able to answer questions appropriately. ENT following for possible trach placement next week.  Micro Data:  12/14 BLOOD CX + STREP PNEUMONIA 12/14 Respiratory Viral Panel 20 Pathogen >> negative 12/14 Aspergillus >> negative 12/14 Histoplasma Antigen >> negative 12/14 Fungitell Beta D Glucan >> negative 12/14 Pneumocystitis smear >> negative 12/14 Fungal Culture >> negative  Antimicrobials:   Antibiotics Given (last 72 hours)     Date/Time Action Medication Dose Rate   07/04/24 0924 New Bag/Given   linezolid  (ZYVOX ) IVPB 600 mg 600 mg 300 mL/hr   07/04/24 1600 New Bag/Given   ceFEPIme  (MAXIPIME ) 2 g in sodium chloride  0.9 % 100 mL IVPB 2 g 200 mL/hr   07/04/24 2227 New Bag/Given   linezolid  (ZYVOX ) IVPB 600 mg 600 mg 300 mL/hr   07/05/24 0022 New Bag/Given   ceFEPIme  (MAXIPIME ) 2 g in sodium chloride  0.9 % 100 mL IVPB 2 g 200 mL/hr   07/05/24 0849 New Bag/Given   ceFEPIme  (MAXIPIME ) 2 g in sodium chloride  0.9 % 100 mL IVPB 2 g 200 mL/hr   07/05/24 1045 New Bag/Given   linezolid  (ZYVOX ) IVPB 600 mg 600 mg 300 mL/hr   07/05/24 1609 New Bag/Given   ceFEPIme  (MAXIPIME ) 2 g in sodium chloride  0.9 % 100 mL IVPB 2 g 200 mL/hr   07/05/24 2211 New Bag/Given   linezolid  (ZYVOX ) IVPB 600 mg 600 mg 300 mL/hr   07/05/24 2346 New Bag/Given   ceFEPIme  (MAXIPIME ) 2 g in sodium chloride  0.9 % 100 mL IVPB 2 g  200 mL/hr   07/06/24 9157 New Bag/Given   ceFEPIme  (MAXIPIME ) 2 g in sodium chloride  0.9 % 100 mL IVPB 2 g 200 mL/hr   07/06/24 9050 New Bag/Given   linezolid  (ZYVOX ) IVPB 600 mg 600 mg 300 mL/hr   07/06/24 1553 New Bag/Given   ceFEPIme  (MAXIPIME ) 2 g in sodium chloride  0.9 % 100 mL  IVPB 2 g 200 mL/hr   07/06/24 2225 New Bag/Given   linezolid  (ZYVOX ) IVPB 600 mg 600 mg 300 mL/hr   07/07/24 0116 New Bag/Given   ceFEPIme  (MAXIPIME ) 2 g in sodium chloride  0.9 % 100 mL IVPB 2 g 200 mL/hr   07/07/24 0752 New Bag/Given   ceFEPIme  (MAXIPIME ) 2 g in sodium chloride  0.9 % 100 mL IVPB 2 g 200 mL/hr      Interim History / Subjective:  Remains intubated and sedated on low dose pressors, no other acute overnight events. Much more awake and alert today. Interactive, able to follow commands, answer questions appropriately and move all extremities purposefully  Vent Mode: PRVC FiO2 (%):  [30 %-50 %] 50 % Set Rate:  [20 bmp-24 bmp] 24 bmp Vt Set:  [390 mL-450 mL] 390 mL PEEP:  [8 cmH20] 8 cmH20 Plateau Pressure:  [22 cmH20] 22 cmH20   Objective   Blood pressure (!) 115/57, pulse 67, temperature (!) 96.6 F (35.9 C), resp. rate (!) 24, height 5' 4.02 (1.626 m), weight 73.5 kg, last menstrual period 06/08/2024, SpO2 93%.    Vent Mode: PRVC FiO2 (%):  [30 %-50 %] 50 % Set Rate:  [20 bmp-24 bmp] 24 bmp Vt Set:  [390 mL-450 mL] 390 mL PEEP:  [8 cmH20] 8 cmH20 Plateau Pressure:  [22 cmH20] 22 cmH20   Intake/Output Summary (Last 24 hours) at 07/07/2024 9187 Last data filed at 07/07/2024 0757 Gross per 24 hour  Intake 4541.59 ml  Output 2750 ml  Net 1791.59 ml   Filed Weights   06/27/24 1025 06/27/24 1127 06/27/24 1614  Weight: 73.5 kg 73.5 kg 73.5 kg   Physical Exam: General:  critically ill but well nourished young female, intubated and awake in NAD HENT: Normocephalic, atraumatic, MM pink/moist, sclera anicteric, supple, no JVD CV: NSR, S1 S2, no m/r/g, radial pulses 2+, distal pulses 2+ PULM:  on PRVC, synchronous with vent, no rhonchi or wheezing, diminished in the bilateral bases  GI: soft, non-tender, non-distended, no rebound/guarding, bowel sounds x 4  Extremities: warm/dry, 1+ edema of bilateral upper and lower extremities Neuro: intubated and sedated, awake  and able to answer questions appropriately, move extremities purposefully, no focal neuro deficits, follows commands, PERRL   ASSESSMENT AND PLAN  43 yo white female with severe ALI from acute severe bacterial pneumonia (STREP Pneumonia) with bacteremia leading to severe hypoxic respiratory failure and metabolic encephalopathy, leading to emergent intubation  #Acute Metabolic Encephalopathy ~ Improving #Polysubstance Abuse #Sedation due to mechanical ventilation - Supportive care - PAD protocol - Sedation to maintain RASS goal - Currently on Propofol  + Dilaudid  + Precedex  gtt - Will increase Precedex  and decrease Propofol  as able - WUA daily; wean sedation as able - Discontinue clonidine  - Encourage family support at bedside  #Severe Acute Hypoxic and Hypercapnic Respiratory Failure secondary to Strep Pneumonia  - Full vent support - Wean FiO2 and PEEP as tolerated - VAP protocol - Wean PEEP & FiO2 as tolerated, maintain SpO2 > 90% - SBT once mental and respiratory parameters met - Head of bed elevated 30 degrees - Plateau pressures less than 30 cm  H20  - Intermittent chest x-ray & ABG PRN - Ensure adequate pulmonary hygiene  - Duonebs Q6h - Solumedrol IV 20mg  daily - Intermittent CXR and ABG - ENT following for possible tracheostomy next week - PT/OT consults  #Septic Shock s/t Strep Pneumonia Bacteremia #Prolonged Qtc Interval ~ Resolved 12/14 ECHO: LVEF 65-70% with normal function; RV normal size and function; mildly dilated LA; mild mitral valve regurgitation; no AR; IVC normal size with >50% variability - Continuous cardiac monitoring - Vasopressors to maintain MAP >65 - Currently on low requirements of levophed  gtt - Wean vasopressors as able - EKG prn - Antihypertensives prn - Hydralazine  10mg  Q4h prn SBP>185 or DBP>100, Labetalol  10-20mg  Q2h prn SBP>185 or DBP >100  - Metoprolol  5mg  Q6h for sustained tachycardia >130  #AKI secondary to Septic Shock ~  Resolved #Hypernatremia ~Resolved - Strict I/O - Trend BMP and monitor renal function - Diurese as renal function permits; will give lasix  40mg  IV this am, recheck BMP for possible second dose - Avoid nephrotoxins as able - Ensure adequate renal perfusion - Renal dose medications - Continue free water  per tube, Q4h - ICU electrolyte replacement protocol - Pharmacy to assist with replacement as indicated  #GI Prophylaxis - Constipation protocol prn - Diet: tube feeds - Pepcid  40mg  daily per tube  #Acute Anemia #DVT Prophylaxis - Trend CBC - Monitor coags prn - Transfuse if Hgb < 7 or platelets <10 - Monitor for s/sx of bleeding - Switched from SQ heparin  to Lovenox  on 12/22  #Hyperglycemia  - ICU hypo/hyperglycemia protocol - Goal range 140-180 - Lantus  10 units - Novolog  - Remains on Solumedrol 20mg  daily for 2 more doses  #Strep Pneumonia Bacteremia - Trend WBC and monitor fever curve - Tylenol  for fevers prn - Complete 7 day course cefepime  and linezolid  - Start Ceftriaxone  course 12/25 - BAL negative - RVP negative  12/24: Updated husband at bedside regarding current status and plan of care  Best practice (right click and Reselect all SmartList Selections daily)  Diet:  NPO; tube feeds Pain/Anxiety/Delirium protocol (if indicated): Yes (RASS goal -2) VAP protocol (if indicated): Yes DVT prophylaxis: Lovenox  GI prophylaxis: H2B Central venous access:  Left IJV CVC Arterial line:  Yes, and it is still needed Foley:  no, removed 12/22 Mobility:  bed rest  Code Status:  FULL CODE Disposition: ICU  Labs   CBC: Recent Labs  Lab 07/03/24 0502 07/04/24 0416 07/05/24 0400 07/06/24 0245 07/07/24 0502  WBC 18.5* 32.9* 26.3* 22.4* 19.0*  HGB 8.8* 8.9* 8.5* 8.2* 8.0*  HCT 27.8* 29.0* 26.9* 25.6* 25.3*  MCV 94.9 98.3 96.1 95.9 94.4  PLT 218 416* 415* 407* 425*    Basic Metabolic Panel: Recent Labs  Lab 07/03/24 0502 07/04/24 0416 07/05/24 0400  07/05/24 1720 07/06/24 0245 07/06/24 1541 07/07/24 0502  NA 143 142 143 144 142 139 141  K 4.2 4.7 3.0* 4.2 4.3 4.5 3.8  CL 103 106 105 104 106 102 104  CO2 32 28 29 30 29 28 28   GLUCOSE 269* 229* 163* 184* 147* 187* 87  BUN 25* 28* 25* 26* 22* 23* 23*  CREATININE 0.39* 0.39* 0.34* 0.45 0.37* 0.41* 0.31*  CALCIUM  8.9 8.9 8.4* 8.7* 8.7* 8.8* 9.1  MG 1.7 1.8 1.7 2.0 1.8  --  1.9  PHOS 3.6 4.3 2.8  --  3.2  --  3.9   GFR: Estimated Creatinine Clearance: 87.2 mL/min (A) (by C-G formula based on SCr of 0.31 mg/dL (L)). Recent Labs  Lab 07/04/24 0416 07/05/24 0400 07/06/24 0245 07/07/24 0502  WBC 32.9* 26.3* 22.4* 19.0*    Liver Function Tests: Recent Labs  Lab 07/03/24 0502 07/04/24 0416 07/05/24 0400 07/06/24 0245 07/07/24 0502  ALBUMIN 2.7* 2.6* 2.5* 2.7* 2.7*   No results for input(s): LIPASE, AMYLASE in the last 168 hours. No results for input(s): AMMONIA in the last 168 hours.  ABG    Component Value Date/Time   PHART 7.5 (H) 07/04/2024 1242   PCO2ART 43 07/04/2024 1242   PO2ART 63 (L) 07/04/2024 1242   HCO3 33.5 (H) 07/04/2024 1242   ACIDBASEDEF 3.2 (H) 06/28/2024 0544   O2SAT 95.8 07/04/2024 1242     Coagulation Profile: No results for input(s): INR, PROTIME in the last 168 hours.  Cardiac Enzymes: No results for input(s): CKTOTAL, CKMB, CKMBINDEX, TROPONINI in the last 168 hours.  HbA1C: Hemoglobin A1C  Date/Time Value Ref Range Status  07/21/2012 04:48 AM 4.8 4.2 - 6.3 % Final    Comment:    The American Diabetes Association recommends that a primary goal of therapy should be <7% and that physicians should reevaluate the treatment regimen in patients with HbA1c values consistently >8%.    Hgb A1c MFr Bld  Date/Time Value Ref Range Status  06/29/2024 05:04 AM 6.0 (H) 4.8 - 5.6 % Final    Comment:    (NOTE) Diagnosis of Diabetes The following HbA1c ranges recommended by the American Diabetes Association (ADA) may be used as  an aid in the diagnosis of diabetes mellitus.  Hemoglobin             Suggested A1C NGSP%              Diagnosis  <5.7                   Non Diabetic  5.7-6.4                Pre-Diabetic  >6.4                   Diabetic  <7.0                   Glycemic control for                       adults with diabetes.      CBG: Recent Labs  Lab 07/06/24 1707 07/06/24 1919 07/07/24 0112 07/07/24 0309 07/07/24 0716  GLUCAP 165* 154* 179* 116* 139*    Allergies Allergies[1]   DVT/GI PRX assessed I Assessed the need for Labs I Assessed the need for Foley I Assessed the need for Central Venous Line Family Discussion when available I Assessed the need for Mobilization I made an Assessment of medications to be adjusted accordingly Safety Risk assessment completed  CASE DISCUSSED IN MULTIDISCIPLINARY ROUNDS WITH ICU TEAM   CRITICAL CARE Performed by: Robet Kim   Total critical care time: 50 minutes  Robet Kim, PA-C [facility] Contact Info: [phone]                   [1]  Allergies Allergen Reactions   Varenicline  Tartrate Other (See Comments)    varenicline    Wellbutrin  [Bupropion ]     Suicidal thoughts    "

## 2024-07-07 NOTE — Evaluation (Signed)
 Physical Therapy Evaluation Patient Details Name: [name] Phillips MRN: [id number] DOB: 1980-09-26 Today's Date: 07/07/2024  History of Present Illness  Patient is a 43 year old female with acute hypoxic respiratory failure, severe strep pneumo pneumonia, strep pneumo bacteremia, septic shock. History of polysubstance and IV drug use, on suboxone  prior to presentation  Clinical Impression  Patient seen for PT evaluation. She is independent at baseline and works as a investment banker, corporate. She lives with her spouse. Mother-in-law at bedside provided information.  Today the patient is alert, awake, and follow commands while intubated. PEEP 8, Fi02 45% and vitals stable throughout session. She tolerated having head of bed up to 60 degrees with complains of pain related to FMS. She is able to participate with ROM of extremities and has head and neck control without posterior support. Recommend intermittently placing bed in chair position as tolerated for upright conditioning and to promote readiness for progression of mobility. PT will continue to follow to maximize independence. Anticipate patient will require continued PT after this hospital stay.       If plan is discharge home, recommend the following: Two people to help with walking and/or transfers;Two people to help with bathing/dressing/bathroom;Assistance with cooking/housework;Assist for transportation;Help with stairs or ramp for entrance;Supervision due to cognitive status   Can travel by private vehicle        Equipment Recommendations  (TBD)  Recommendations for Other Services       Functional Status Assessment Patient has had a recent decline in their functional status and demonstrates the ability to make significant improvements in function in a reasonable and predictable amount of time.     Precautions / Restrictions Precautions Precautions: Fall Recall of Precautions/Restrictions: Impaired Restrictions Weight Bearing  Restrictions Per Provider Order: No      Mobility  Bed Mobility               General bed mobility comments: elevated head of bed to 60 degrees. patient is awake and following commands with weakness noted in all extremities. vitals stable during session but patient does not tolerate sitting upright more than 10 minutes secondary to FMS tube discomfort in upright position.    Transfers                        Ambulation/Gait                  Stairs            Wheelchair Mobility     Tilt Bed    Modified Rankin (Stroke Patients Only)       Balance                                             Pertinent Vitals/Pain Pain Assessment Pain Assessment: Faces Faces Pain Scale: Hurts little more Pain Location: buttock where FMS is in place Pain Descriptors / Indicators: Discomfort Pain Intervention(s): Repositioned    Home Living Family/patient expects to be discharged to:: Private residence Living Arrangements: Spouse/significant other Available Help at Discharge: Family Type of Home: House Home Access: Stairs to enter   Secretary/administrator of Steps: 3   Home Layout: One level        Prior Function Prior Level of Function : Independent/Modified Independent;Working/employed             Mobility Comments: manages  an apartment complex. independent ADLs Comments: independent     Extremity/Trunk Assessment   Upper Extremity Assessment Upper Extremity Assessment: Generalized weakness    Lower Extremity Assessment Lower Extremity Assessment: Generalized weakness (active movement in LE to command. PROM of ankle is WFL. recommend to continue with prevlon boots intermittently)       Communication   Communication Communication: No apparent difficulties    Cognition Arousal: Alert Behavior During Therapy: WFL for tasks assessed/performed   PT - Cognitive impairments: Difficult to assess Difficult to assess  due to: Intubated                     PT - Cognition Comments: Patient is intubated with ETT in place. She can nod yes/no appropriately and follow simple commands. She is awake and alert throughout session. Difficulty understanding with attempts at mouthing words around the ETT Following commands: Impaired Following commands impaired: Follows one step commands with increased time     Cueing Cueing Techniques: Verbal cues     General Comments General comments (skin integrity, edema, etc.): patient is able to hold head upright for brief periods without posterior neck support while head of bed is elevated. PEEP 8, Fi02 45%. vitals monitored throughout session. Sp02 93%    Exercises General Exercises - Lower Extremity Ankle Circles/Pumps: AROM, AAROM, PROM, Strengthening, Both, 10 reps, Supine Hip ABduction/ADduction: PROM, AROM, AAROM, Strengthening, Both, 5 reps, Supine   Assessment/Plan    PT Assessment Patient needs continued PT services  PT Problem List Decreased strength;Decreased range of motion;Decreased activity tolerance;Decreased balance;Decreased mobility;Decreased cognition;Decreased knowledge of use of DME;Decreased knowledge of precautions;Decreased safety awareness;Cardiopulmonary status limiting activity       PT Treatment Interventions DME instruction;Gait training;Stair training;Functional mobility training;Therapeutic activities;Therapeutic exercise;Balance training;Neuromuscular re-education;Cognitive remediation;Patient/family education    PT Goals (Current goals can be found in the Care Plan section)  Acute Rehab PT Goals Patient Stated Goal: to be able to get up and go home PT Goal Formulation: With patient Time For Goal Achievement: 07/21/24 Potential to Achieve Goals: Fair    Frequency Min 1X/week     Co-evaluation PT/OT/SLP Co-Evaluation/Treatment: Yes Reason for Co-Treatment: Complexity of the patient's impairments (multi-system involvement) PT  goals addressed during session: Mobility/safety with mobility         AM-PAC PT 6 Clicks Mobility  Outcome Measure Help needed turning from your back to your side while in a flat bed without using bedrails?: A Little Help needed moving from lying on your back to sitting on the side of a flat bed without using bedrails?: A Lot Help needed moving to and from a bed to a chair (including a wheelchair)?: A Lot Help needed standing up from a chair using your arms (e.g., wheelchair or bedside chair)?: A Lot Help needed to walk in hospital room?: A Lot Help needed climbing 3-5 steps with a railing? : Total 6 Click Score: 12    End of Session   Activity Tolerance: Patient tolerated treatment well Patient left: in bed;with call bell/phone within reach;with family/visitor present Nurse Communication: Mobility status PT Visit Diagnosis: Muscle weakness (generalized) (M62.81);Unsteadiness on feet (R26.81)    Time: 8848-8789 PT Time Calculation (min) (ACUTE ONLY): 19 min   Charges:   PT Evaluation $PT Eval High Complexity: 1 High   PT General Charges $$ ACUTE PT VISIT: 1 Visit         Randine Essex, PT, MPT  Randine LULLA Essex 07/07/2024, 1:49 PM

## 2024-07-08 DIAGNOSIS — J9601 Acute respiratory failure with hypoxia: Secondary | ICD-10-CM | POA: Diagnosis not present

## 2024-07-08 DIAGNOSIS — F1190 Opioid use, unspecified, uncomplicated: Secondary | ICD-10-CM

## 2024-07-08 DIAGNOSIS — A419 Sepsis, unspecified organism: Secondary | ICD-10-CM | POA: Diagnosis not present

## 2024-07-08 DIAGNOSIS — F1129 Opioid dependence with unspecified opioid-induced disorder: Secondary | ICD-10-CM

## 2024-07-08 DIAGNOSIS — R6521 Severe sepsis with septic shock: Secondary | ICD-10-CM | POA: Diagnosis not present

## 2024-07-08 DIAGNOSIS — J154 Pneumonia due to other streptococci: Secondary | ICD-10-CM | POA: Diagnosis not present

## 2024-07-08 LAB — GLUCOSE, CAPILLARY
Glucose-Capillary: 123 mg/dL — ABNORMAL HIGH (ref 70–99)
Glucose-Capillary: 142 mg/dL — ABNORMAL HIGH (ref 70–99)
Glucose-Capillary: 153 mg/dL — ABNORMAL HIGH (ref 70–99)
Glucose-Capillary: 175 mg/dL — ABNORMAL HIGH (ref 70–99)
Glucose-Capillary: 178 mg/dL — ABNORMAL HIGH (ref 70–99)
Glucose-Capillary: 182 mg/dL — ABNORMAL HIGH (ref 70–99)
Glucose-Capillary: 62 mg/dL — ABNORMAL LOW (ref 70–99)
Glucose-Capillary: 64 mg/dL — ABNORMAL LOW (ref 70–99)
Glucose-Capillary: 82 mg/dL (ref 70–99)
Glucose-Capillary: 95 mg/dL (ref 70–99)

## 2024-07-08 LAB — CBC
HCT: 24.3 % — ABNORMAL LOW (ref 36.0–46.0)
Hemoglobin: 7.9 g/dL — ABNORMAL LOW (ref 12.0–15.0)
MCH: 30.7 pg (ref 26.0–34.0)
MCHC: 32.5 g/dL (ref 30.0–36.0)
MCV: 94.6 fL (ref 80.0–100.0)
Platelets: 491 K/uL — ABNORMAL HIGH (ref 150–400)
RBC: 2.57 MIL/uL — ABNORMAL LOW (ref 3.87–5.11)
RDW: 15.3 % (ref 11.5–15.5)
WBC: 12.1 K/uL — ABNORMAL HIGH (ref 4.0–10.5)
nRBC: 0 % (ref 0.0–0.2)

## 2024-07-08 LAB — RENAL FUNCTION PANEL
Albumin: 2.8 g/dL — ABNORMAL LOW (ref 3.5–5.0)
Anion gap: 10 (ref 5–15)
BUN: 21 mg/dL — ABNORMAL HIGH (ref 6–20)
CO2: 29 mmol/L (ref 22–32)
Calcium: 9.1 mg/dL (ref 8.9–10.3)
Chloride: 102 mmol/L (ref 98–111)
Creatinine, Ser: 0.32 mg/dL — ABNORMAL LOW (ref 0.44–1.00)
GFR, Estimated: >60 mL/min
Glucose, Bld: 95 mg/dL (ref 70–99)
Phosphorus: 4.2 mg/dL (ref 2.5–4.6)
Potassium: 3.3 mmol/L — ABNORMAL LOW (ref 3.5–5.1)
Sodium: 141 mmol/L (ref 135–145)

## 2024-07-08 LAB — MAGNESIUM: Magnesium: 1.7 mg/dL (ref 1.7–2.4)

## 2024-07-08 MED ORDER — FUROSEMIDE 10 MG/ML IJ SOLN
40.0000 mg | Freq: Once | INTRAMUSCULAR | Status: AC
  Administered 2024-07-08: 40 mg via INTRAVENOUS
  Filled 2024-07-08: qty 4

## 2024-07-08 MED ORDER — POTASSIUM CHLORIDE 10 MEQ/50ML IV SOLN
10.0000 meq | INTRAVENOUS | Status: AC
  Administered 2024-07-08 (×4): 10 meq via INTRAVENOUS
  Filled 2024-07-08 (×4): qty 50

## 2024-07-08 MED ORDER — POLYVINYL ALCOHOL 1.4 % OP SOLN
1.0000 [drp] | OPHTHALMIC | Status: DC | PRN
  Administered 2024-07-08: 1 [drp] via OPHTHALMIC
  Filled 2024-07-08: qty 15

## 2024-07-08 MED ORDER — HYDROMORPHONE HCL 1 MG/ML IJ SOLN
0.5000 mg | INTRAMUSCULAR | Status: DC | PRN
  Administered 2024-07-08 – 2024-07-09 (×6): 1 mg via INTRAVENOUS
  Filled 2024-07-08 (×6): qty 1

## 2024-07-08 MED ORDER — SERTRALINE HCL 50 MG PO TABS: 100.0000 mg | ORAL_TABLET | Freq: Every day | ORAL | Status: DC

## 2024-07-08 MED ORDER — HYDROMORPHONE HCL 1 MG/ML IJ SOLN
0.5000 mg | INTRAMUSCULAR | Status: DC | PRN
  Administered 2024-07-08: 1 mg via INTRAVENOUS
  Filled 2024-07-08: qty 1

## 2024-07-08 MED ORDER — KETOROLAC TROMETHAMINE 15 MG/ML IJ SOLN
15.0000 mg | Freq: Three times a day (TID) | INTRAMUSCULAR | Status: AC | PRN
  Administered 2024-07-08 – 2024-07-11 (×6): 15 mg via INTRAVENOUS
  Filled 2024-07-08 (×7): qty 1

## 2024-07-08 MED ORDER — HYDROMORPHONE BOLUS VIA INFUSION: 0.2500 mg | INTRAVENOUS | Status: DC | PRN

## 2024-07-08 MED ORDER — DIAZEPAM 5 MG/ML IJ SOLN
5.0000 mg | INTRAMUSCULAR | Status: DC | PRN
  Administered 2024-07-08 – 2024-07-09 (×3): 5 mg via INTRAVENOUS
  Filled 2024-07-08 (×5): qty 2

## 2024-07-08 MED ORDER — HYDROMORPHONE HCL-NACL 50-0.9 MG/50ML-% IV SOLN: 0.5000 mg/h | INTRAVENOUS | Status: DC

## 2024-07-08 MED ORDER — DEXTROSE 50 % IV SOLN
12.5000 g | INTRAVENOUS | Status: AC
  Administered 2024-07-08: 12.5 g via INTRAVENOUS

## 2024-07-08 MED ORDER — MAGNESIUM SULFATE 2 GM/50ML IV SOLN
2.0000 g | Freq: Once | INTRAVENOUS | Status: AC
  Administered 2024-07-08: 2 g via INTRAVENOUS
  Filled 2024-07-08: qty 50

## 2024-07-08 MED ORDER — ACETAMINOPHEN 10 MG/ML IV SOLN
1000.0000 mg | Freq: Four times a day (QID) | INTRAVENOUS | Status: AC
  Administered 2024-07-08 – 2024-07-09 (×4): 1000 mg via INTRAVENOUS
  Filled 2024-07-08 (×5): qty 100

## 2024-07-08 MED ORDER — SODIUM CHLORIDE 0.9 % IV SOLN
0.5000 mg/h | INTRAVENOUS | Status: DC
  Administered 2024-07-08: 6.5 mg/h via INTRAVENOUS
  Filled 2024-07-08: qty 5

## 2024-07-08 MED ORDER — OXYCODONE HCL 5 MG PO TABS
30.0000 mg | ORAL_TABLET | Freq: Four times a day (QID) | ORAL | Status: DC
  Administered 2024-07-08: 30 mg
  Filled 2024-07-08: qty 6

## 2024-07-08 MED ORDER — LIDOCAINE 5 % EX PTCH
1.0000 | MEDICATED_PATCH | CUTANEOUS | Status: DC
  Administered 2024-07-09 – 2024-07-15 (×5): 1 via TRANSDERMAL
  Filled 2024-07-08 (×4): qty 1

## 2024-07-08 MED ORDER — HYDROXYZINE HCL 10 MG/5ML PO SYRP: 25.0000 mg | ORAL_SOLUTION | Freq: Three times a day (TID) | ORAL | Status: DC | PRN

## 2024-07-08 MED ORDER — POTASSIUM CHLORIDE 20 MEQ PO PACK
40.0000 meq | PACK | Freq: Once | ORAL | Status: AC
  Administered 2024-07-08: 40 meq
  Filled 2024-07-08: qty 2

## 2024-07-08 NOTE — Progress Notes (Signed)
 "  NAME:  [name] Phillips, MRN:  [id number], DOB:  [birth year], LOS: 11 ADMISSION DATE:  06/27/2024  CHIEF COMPLAINT:  severe resp failure, COCAINE ABUSE AND STREP PNEUMONIA  History of Present Illness:   43 year old female with history of polysubstance use (IVDU with fentanyl , last injection 4 days ago) who presents with increased shortness of breath and admitted for management of pneumonia. +COCAINE  Patient with worsening respiratory status with increased work of breathing, worsening hypoxia, and increased tachycardia. With her very severe pneumonia we would need to escalate therapy further with intubation, mechanical ventilation, and consideration for bronchoscopy to clear airway secretions. Discussed with the patient and her husband at bedside and she consents to proceed.   Pertinent Medical History:  Hypertension Seizure Alcohol  Use Polysubstance abuse  Significant Hospital Events: Including procedures, antibiotic start and stop dates in addition to other pertinent events   12/14: admit with right sided pneumonia and respiratory failure. CXR with right lung white out, INTUBATED, S/p BRONCH, ART LINE PLACED 12/14: BLOOD CX +STREP PNEUMONIA 12/15: remains on vent 12/16: remains on vent 12/17: remains on vent, severe hypoxia, CVL placed 12/18: severe Hypoxia s/p BRONCH mucoid secretions, worsening CXR, ABX broadened to cover Pseudomonas and MRSA 12/19: severe hypoxia 12/21: attempt sedation wean, diurese 12/22: Remains critically ill requiring mechanical ventilation, levophed  and multiple sedation agents. Diuresed with Lasix  40mg  yesterday; 5.9L UO. Will attempt at [facility] today and wean vent requirements if able 12/23: Remains critically ill requiring mechanical ventilation. Still on levophed , propofol , dilaudid  and precedex . O2 requirements back to 60%; went up to 100% yesterday d/t ETT progressing into right mainstem and needed retraction. Required versed  early this am for acute  agitation when trying to get a bath. Wean vent settings as able. Will hold of on weaning sedation today as she is awake on current sedation requirements 12/24: Remains critically ill requiring mechanical ventilation. Low pressor requirements and decreasing sedation needs. Wean vent settings as able. Awake and able to answer questions appropriately. ENT following for possible trach placement next week.  Micro Data:  12/14 BLOOD CX + STREP PNEUMONIA 12/14 Respiratory Viral Panel 20 Pathogen >> negative 12/14 Aspergillus >> negative 12/14 Histoplasma Antigen >> negative 12/14 Fungitell Beta D Glucan >> negative 12/14 Pneumocystitis smear >> negative 12/14 Fungal Culture >> negative  Antimicrobials:   Antibiotics Given (last 72 hours)     Date/Time Action Medication Dose Rate   07/05/24 1045 New Bag/Given   linezolid  (ZYVOX ) IVPB 600 mg 600 mg 300 mL/hr   07/05/24 1609 New Bag/Given   ceFEPIme  (MAXIPIME ) 2 g in sodium chloride  0.9 % 100 mL IVPB 2 g 200 mL/hr   07/05/24 2211 New Bag/Given   linezolid  (ZYVOX ) IVPB 600 mg 600 mg 300 mL/hr   07/05/24 2346 New Bag/Given   ceFEPIme  (MAXIPIME ) 2 g in sodium chloride  0.9 % 100 mL IVPB 2 g 200 mL/hr   07/06/24 0842 New Bag/Given   ceFEPIme  (MAXIPIME ) 2 g in sodium chloride  0.9 % 100 mL IVPB 2 g 200 mL/hr   07/06/24 0949 New Bag/Given   linezolid  (ZYVOX ) IVPB 600 mg 600 mg 300 mL/hr   07/06/24 1553 New Bag/Given   ceFEPIme  (MAXIPIME ) 2 g in sodium chloride  0.9 % 100 mL IVPB 2 g 200 mL/hr   07/06/24 2225 New Bag/Given   linezolid  (ZYVOX ) IVPB 600 mg 600 mg 300 mL/hr   07/07/24 0116 New Bag/Given   ceFEPIme  (MAXIPIME ) 2 g in sodium chloride  0.9 % 100 mL IVPB 2 g  200 mL/hr   07/07/24 0752 New Bag/Given   ceFEPIme  (MAXIPIME ) 2 g in sodium chloride  0.9 % 100 mL IVPB 2 g 200 mL/hr   07/07/24 9062 New Bag/Given   linezolid  (ZYVOX ) IVPB 600 mg 600 mg 300 mL/hr   07/07/24 1809 New Bag/Given   ceFEPIme  (MAXIPIME ) 2 g in sodium chloride  0.9 % 100 mL  IVPB 2 g 200 mL/hr   07/07/24 2129 New Bag/Given   linezolid  (ZYVOX ) IVPB 600 mg 600 mg 300 mL/hr      Interim History / Subjective:   FiO2 incr overnight to 60% Anxiety this morning   K low at 3.3 hgb stable, stable thrombocytosis   Objective   Blood pressure (!) 151/82, pulse (!) 104, temperature 98.1 F (36.7 C), temperature source Axillary, resp. rate (!) 21, height 5' 4.02 (1.626 m), weight 73.5 kg, last menstrual period 06/08/2024, SpO2 (!) 86%.    Vent Mode: PRVC FiO2 (%):  [40 %-60 %] 50 % Set Rate:  [16 bmp-24 bmp] 16 bmp Vt Set:  [390 mL] 390 mL PEEP:  [5 cmH20-8 cmH20] 8 cmH20   Intake/Output Summary (Last 24 hours) at 07/08/2024 0955 Last data filed at 07/08/2024 0900 Gross per 24 hour  Intake 3510.68 ml  Output 2650 ml  Net 860.68 ml   Filed Weights   06/27/24 1025 06/27/24 1127 06/27/24 1614  Weight: 73.5 kg 73.5 kg 73.5 kg   Physical Exam:  Gen: critically ill middle aged F  Neuro: Awakens on sedation. With sedation pause, alert, following commands  Psych: anxious mood and affect  HEENT: NCAT ETT secure  CV: rr s1s2  Pulm: Mechanically ventilated. Rhonchi, faint wheeze.  GI: soft ndnt  GU: purewick  MSK: pitting edema   ASSESSMENT AND PLAN   Acute respiratory failure w hypoxemia  Strep pneumo PNA  P -lighten RASS goal to  0 -dc prop -wean dilaudid  -cont precedex  -on solumedrol taper  -repeat diuresis 12/25, dc fwf -WAU/SBT, pulm hygiene. +/- ENT trach next week if unable to extubate in the coming days  Sev sepsis 2/2 Strep pneumo bacteremia, PNA -improved shock   P - Rocephin   -MAP goal > 65, has been weaned off NE    Hypokalemia -replace  -follow BMP   Anemia, critical illness Thrombocytosis, reactive  -follow CBC   Prediabetes w hyperglycemia  P -cont rSSI + lantus  10u -IV steroids w stop date, needs might decr some after   Labs   CBC: Recent Labs  Lab 07/04/24 0416 07/05/24 0400 07/06/24 0245 07/07/24 0502  07/08/24 0449  WBC 32.9* 26.3* 22.4* 19.0* 12.1*  HGB 8.9* 8.5* 8.2* 8.0* 7.9*  HCT 29.0* 26.9* 25.6* 25.3* 24.3*  MCV 98.3 96.1 95.9 94.4 94.6  PLT 416* 415* 407* 425* 491*    Basic Metabolic Panel: Recent Labs  Lab 07/04/24 0416 07/05/24 0400 07/05/24 1720 07/06/24 0245 07/06/24 1541 07/07/24 0502 07/07/24 1259 07/07/24 2003 07/08/24 0449  NA 142 143 144 142 139 141 141 141 141  K 4.7 3.0* 4.2 4.3 4.5 3.8 4.4 3.7 3.3*  CL 106 105 104 106 102 104 102 101 102  CO2 28 29 30 29 28 28 27 29 29   GLUCOSE 229* 163* 184* 147* 187* 87 181* 169* 95  BUN 28* 25* 26* 22* 23* 23* 24* 22* 21*  CREATININE 0.39* 0.34* 0.45 0.37* 0.41* 0.31* 0.48 0.35* 0.32*  CALCIUM  8.9 8.4* 8.7* 8.7* 8.8* 9.1 9.4 8.8* 9.1  MG 1.8 1.7 2.0 1.8  --  1.9  --   --  1.7  PHOS 4.3 2.8  --  3.2  --  3.9  --   --  4.2   GFR: Estimated Creatinine Clearance: 87.2 mL/min (A) (by C-G formula based on SCr of 0.32 mg/dL (L)). Recent Labs  Lab 07/05/24 0400 07/06/24 0245 07/07/24 0502 07/08/24 0449  WBC 26.3* 22.4* 19.0* 12.1*    Liver Function Tests: Recent Labs  Lab 07/04/24 0416 07/05/24 0400 07/06/24 0245 07/07/24 0502 07/08/24 0449  ALBUMIN 2.6* 2.5* 2.7* 2.7* 2.8*   No results for input(s): LIPASE, AMYLASE in the last 168 hours. No results for input(s): AMMONIA in the last 168 hours.  ABG    Component Value Date/Time   PHART 7.5 (H) 07/04/2024 1242   PCO2ART 43 07/04/2024 1242   PO2ART 63 (L) 07/04/2024 1242   HCO3 33.5 (H) 07/04/2024 1242   ACIDBASEDEF 3.2 (H) 06/28/2024 0544   O2SAT 95.8 07/04/2024 1242     Coagulation Profile: No results for input(s): INR, PROTIME in the last 168 hours.  Cardiac Enzymes: No results for input(s): CKTOTAL, CKMB, CKMBINDEX, TROPONINI in the last 168 hours.  HbA1C: Hemoglobin A1C  Date/Time Value Ref Range Status  07/21/2012 04:48 AM 4.8 4.2 - 6.3 % Final    Comment:    The American Diabetes Association recommends that a primary  goal of therapy should be <7% and that physicians should reevaluate the treatment regimen in patients with HbA1c values consistently >8%.    Hgb A1c MFr Bld  Date/Time Value Ref Range Status  06/29/2024 05:04 AM 6.0 (H) 4.8 - 5.6 % Final    Comment:    (NOTE) Diagnosis of Diabetes The following HbA1c ranges recommended by the American Diabetes Association (ADA) may be used as an aid in the diagnosis of diabetes mellitus.  Hemoglobin             Suggested A1C NGSP%              Diagnosis  <5.7                   Non Diabetic  5.7-6.4                Pre-Diabetic  >6.4                   Diabetic  <7.0                   Glycemic control for                       adults with diabetes.      CBG: Recent Labs  Lab 07/07/24 1524 07/07/24 1934 07/08/24 0042 07/08/24 0349 07/08/24 0751  GLUCAP 176* 118* 123* 82 178*    Allergies Allergies[1]   CRITICAL CARE Performed by: Ronnald FORBES Gave   Total critical care time: 42 minutes  Critical care time was exclusive of separately billable procedures and treating other patients. Critical care was necessary to treat or prevent imminent or life-threatening deterioration.  Critical care was time spent personally by me on the following activities: development of treatment plan with patient and/or surrogate as well as nursing, discussions with consultants, evaluation of patient's response to treatment, examination of patient, obtaining history from patient or surrogate, ordering and performing treatments and interventions, ordering and review of laboratory studies, ordering and review of radiographic studies, pulse oximetry and re-evaluation of patient's condition.  Ronnald Gave MSN, AGACNP-BC  Pulmonary/Critical Care Medicine Amion for pager 07/08/2024, 9:55  AM                     [1]  Allergies Allergen Reactions   Varenicline  Tartrate Other (See Comments)    varenicline    Wellbutrin  [Bupropion ]      Suicidal thoughts    "

## 2024-07-08 NOTE — Progress Notes (Signed)
 PHARMACY CONSULT NOTE  Pharmacy Consult for Electrolyte Monitoring and Replacement   Recent Labs: Potassium (mmol/L)  Date Value  07/08/2024 3.3 (L)  10/05/2013 3.5   Magnesium  (mg/dL)  Date Value  87/74/7974 1.7  01/29/2013 2.1   Calcium  (mg/dL)  Date Value  87/74/7974 9.1   Calcium , Total (mg/dL)  Date Value  96/75/7984 9.1   Albumin (g/dL)  Date Value  87/74/7974 2.8 (L)  10/05/2013 4.3   Phosphorus (mg/dL)  Date Value  87/74/7974 4.2  07/21/2012 2.1 (L)   Sodium (mmol/L)  Date Value  07/08/2024 141  10/05/2013 135 (L)    Assessment: 43 y.o. female with medical history significant of essential hypertension, seizure disorder, polysubstance abuse, recent use of fentanyl  and heroin, who presents to the hospital with progressively worsening short of breath, hypoxia.  Pharmacy is asked to follow and replace electrolytes while in CCU  Nutrition: Vital AF at 50 mL/hr + FWF 200 mL every 4 hours  Goal of Therapy:  Electrolytes WNL  Plan:  --K 3.3, Kcl 40 mEq per tube x 1 dose and Kcl 10 mEq IV q1h x 4 ordered per PCCM --Mg 1.7, magnesium  sulfate 2 g IV x 1 --Follow-up electrolytes with AM labs tomorrow  [name] Phillips 07/08/2024 6:39 AM

## 2024-07-08 NOTE — Progress Notes (Signed)
 Pt declined NG tube. Swallow screen to be done tomorrow.

## 2024-07-08 NOTE — Plan of Care (Signed)
" °  Problem: Health Behavior/Discharge Planning: Goal: Ability to manage health-related needs will improve Outcome: Progressing   Problem: Clinical Measurements: Goal: Will remain free from infection Outcome: Progressing Goal: Diagnostic test results will improve Outcome: Progressing Goal: Respiratory complications will improve Outcome: Progressing Goal: Cardiovascular complication will be avoided Outcome: Progressing   Problem: Activity: Goal: Risk for activity intolerance will decrease Outcome: Progressing   Problem: Coping: Goal: Level of anxiety will decrease Outcome: Progressing   "

## 2024-07-08 NOTE — Progress Notes (Signed)
 Pt extubated, per order, to HHFNC of 45L and 40% with no complications. Saturations are 97% at this time.

## 2024-07-09 ENCOUNTER — Inpatient Hospital Stay

## 2024-07-09 DIAGNOSIS — J9601 Acute respiratory failure with hypoxia: Secondary | ICD-10-CM | POA: Diagnosis not present

## 2024-07-09 DIAGNOSIS — J154 Pneumonia due to other streptococci: Secondary | ICD-10-CM | POA: Diagnosis not present

## 2024-07-09 DIAGNOSIS — R6521 Severe sepsis with septic shock: Secondary | ICD-10-CM | POA: Diagnosis not present

## 2024-07-09 DIAGNOSIS — A419 Sepsis, unspecified organism: Secondary | ICD-10-CM | POA: Diagnosis not present

## 2024-07-09 LAB — RENAL FUNCTION PANEL
Albumin: 3.3 g/dL — ABNORMAL LOW (ref 3.5–5.0)
Anion gap: 10 (ref 5–15)
BUN: 22 mg/dL — ABNORMAL HIGH (ref 6–20)
CO2: 28 mmol/L (ref 22–32)
Calcium: 9.3 mg/dL (ref 8.9–10.3)
Chloride: 103 mmol/L (ref 98–111)
Creatinine, Ser: 0.39 mg/dL — ABNORMAL LOW (ref 0.44–1.00)
GFR, Estimated: >60 mL/min
Glucose, Bld: 108 mg/dL — ABNORMAL HIGH (ref 70–99)
Phosphorus: 5 mg/dL — ABNORMAL HIGH (ref 2.5–4.6)
Potassium: 3.8 mmol/L (ref 3.5–5.1)
Sodium: 141 mmol/L (ref 135–145)

## 2024-07-09 LAB — BLOOD GAS, ARTERIAL
Acid-Base Excess: 3 mmol/L — ABNORMAL HIGH (ref 0.0–2.0)
Bicarbonate: 25.5 mmol/L (ref 20.0–28.0)
FIO2: 40 %
O2 Content: 40 L/min
O2 Saturation: 97.7 %
Patient temperature: 37
pCO2 arterial: 32 mmHg (ref 32–48)
pH, Arterial: 7.51 — ABNORMAL HIGH (ref 7.35–7.45)
pO2, Arterial: 79 mmHg — ABNORMAL LOW (ref 83–108)

## 2024-07-09 LAB — CBC
HCT: 27.7 % — ABNORMAL LOW (ref 36.0–46.0)
Hemoglobin: 8.7 g/dL — ABNORMAL LOW (ref 12.0–15.0)
MCH: 30.2 pg (ref 26.0–34.0)
MCHC: 31.4 g/dL (ref 30.0–36.0)
MCV: 96.2 fL (ref 80.0–100.0)
Platelets: 663 K/uL — ABNORMAL HIGH (ref 150–400)
RBC: 2.88 MIL/uL — ABNORMAL LOW (ref 3.87–5.11)
RDW: 15.3 % (ref 11.5–15.5)
WBC: 13.2 K/uL — ABNORMAL HIGH (ref 4.0–10.5)
nRBC: 0 % (ref 0.0–0.2)

## 2024-07-09 LAB — GLUCOSE, CAPILLARY
Glucose-Capillary: 102 mg/dL — ABNORMAL HIGH (ref 70–99)
Glucose-Capillary: 104 mg/dL — ABNORMAL HIGH (ref 70–99)
Glucose-Capillary: 91 mg/dL (ref 70–99)
Glucose-Capillary: 94 mg/dL (ref 70–99)
Glucose-Capillary: 96 mg/dL (ref 70–99)
Glucose-Capillary: 99 mg/dL (ref 70–99)
Glucose-Capillary: 99 mg/dL (ref 70–99)

## 2024-07-09 MED ORDER — OXYCODONE HCL 5 MG PO TABS
30.0000 mg | ORAL_TABLET | Freq: Four times a day (QID) | ORAL | Status: DC
  Administered 2024-07-10: 30 mg via ORAL
  Filled 2024-07-09: qty 6

## 2024-07-09 MED ORDER — HYDROMORPHONE HCL 1 MG/ML IJ SOLN
0.5000 mg | INTRAMUSCULAR | Status: DC | PRN
  Administered 2024-07-09 – 2024-07-12 (×11): 1 mg via INTRAVENOUS
  Filled 2024-07-09 (×10): qty 1

## 2024-07-09 MED ORDER — ACETAMINOPHEN 325 MG PO TABS
650.0000 mg | ORAL_TABLET | Freq: Four times a day (QID) | ORAL | Status: DC | PRN
  Administered 2024-07-14: 650 mg via ORAL

## 2024-07-09 MED ORDER — LEVETIRACETAM (KEPPRA) 500 MG/5 ML ADULT IV PUSH
500.0000 mg | Freq: Two times a day (BID) | INTRAVENOUS | Status: DC
  Administered 2024-07-09 – 2024-07-13 (×8): 500 mg via INTRAVENOUS
  Filled 2024-07-09 (×5): qty 5

## 2024-07-09 MED ORDER — SODIUM CHLORIDE 3 % IN NEBU
4.0000 mL | INHALATION_SOLUTION | Freq: Two times a day (BID) | RESPIRATORY_TRACT | Status: AC
  Administered 2024-07-09 – 2024-07-11 (×4): 4 mL via RESPIRATORY_TRACT
  Filled 2024-07-09 (×6): qty 4

## 2024-07-09 MED ORDER — HYDROMORPHONE HCL 1 MG/ML IJ SOLN: 1.0000 mg | Freq: Once | INTRAMUSCULAR | Status: DC

## 2024-07-09 MED ORDER — CLONIDINE HCL 0.1 MG PO TABS: 0.1000 mg | ORAL_TABLET | Freq: Two times a day (BID) | ORAL | Status: DC

## 2024-07-09 MED ORDER — HYDROXYZINE HCL 25 MG PO TABS: 25.0000 mg | ORAL_TABLET | Freq: Three times a day (TID) | ORAL | Status: DC | PRN

## 2024-07-09 MED ORDER — FUROSEMIDE 10 MG/ML IJ SOLN
20.0000 mg | Freq: Once | INTRAMUSCULAR | Status: AC
  Administered 2024-07-09: 20 mg via INTRAVENOUS
  Filled 2024-07-09: qty 2

## 2024-07-09 MED ORDER — FAMOTIDINE 20 MG PO TABS: 40.0000 mg | ORAL_TABLET | Freq: Every day | ORAL | Status: DC

## 2024-07-09 MED ORDER — FOLIC ACID 1 MG PO TABS: 1.0000 mg | ORAL_TABLET | Freq: Every day | ORAL | Status: DC

## 2024-07-09 MED ORDER — VITAMIN D 25 MCG (1000 UNIT) PO TABS: 2000.0000 [IU] | ORAL_TABLET | Freq: Every day | ORAL | Status: DC

## 2024-07-09 MED ORDER — SERTRALINE HCL 50 MG PO TABS: 100.0000 mg | ORAL_TABLET | Freq: Every day | ORAL | Status: DC

## 2024-07-09 MED ORDER — LORAZEPAM 2 MG/ML IJ SOLN: 2.0000 mg | Freq: Once | INTRAMUSCULAR | Status: AC

## 2024-07-09 MED ORDER — ATORVASTATIN CALCIUM 20 MG PO TABS: 10.0000 mg | ORAL_TABLET | Freq: Every day | ORAL | Status: DC

## 2024-07-09 MED ORDER — LORAZEPAM 2 MG/ML IJ SOLN
INTRAMUSCULAR | Status: AC
  Administered 2024-07-09: 2 mg via INTRAVENOUS
  Filled 2024-07-09: qty 1

## 2024-07-09 MED ORDER — LEVETIRACETAM (KEPPRA) 500 MG/5 ML ADULT IV PUSH: 500.0000 mg | Freq: Two times a day (BID) | INTRAVENOUS | Status: DC

## 2024-07-09 MED ORDER — LEVETIRACETAM (KEPPRA) 500 MG/5 ML ADULT IV PUSH
1000.0000 mg | Freq: Once | INTRAVENOUS | Status: AC
  Administered 2024-07-09: 1000 mg via INTRAVENOUS
  Filled 2024-07-09: qty 10

## 2024-07-09 MED ORDER — THIAMINE HCL 100 MG PO TABS
100.0000 mg | ORAL_TABLET | Freq: Every day | ORAL | Status: DC
  Filled 2024-07-09: qty 1

## 2024-07-09 NOTE — Progress Notes (Signed)
 Physical Therapy Treatment Patient Details Name: [name] Phillips MRN: [id number] DOB: 1981/01/01 Today's Date: 07/09/2024   History of Present Illness Patient is a 43 year old female with acute hypoxic respiratory failure, severe strep pneumo pneumonia, strep pneumo bacteremia, septic shock. History of polysubstance and IV drug use, on suboxone  prior to presentation.  Extubated 07/08/24    PT Comments  Patient extubated 12/25 to HHFNC (40L, 40% FiO2); maintaining sats >90% throughout session.  HR 110-120s, appropriately responsive to activity.  Able to progress mobility this date, completing OOB to chair with max/total assist +2 for forward weight shift, trunk control and lateral movement.  Did attempt to assist with UE placement, but difficulty generating adequate force and comprehending new movement pattern with transfer. Lists to L at times in upright sitting positions, pillow/wedge placed to L lateral side of patient for postural support and midline orientation.  Good progress, tolerance and overall participation with session.  Husband and son at bedside end of session; supportive and encouraging to patient.     If plan is discharge home, recommend the following: Two people to help with walking and/or transfers;Two people to help with bathing/dressing/bathroom;Assistance with cooking/housework;Assist for transportation;Help with stairs or ramp for entrance;Supervision due to cognitive status   Can travel by private vehicle        Equipment Recommendations       Recommendations for Other Services Rehab consult     Precautions / Restrictions Precautions Precautions: Fall Recall of Precautions/Restrictions: Impaired Restrictions Weight Bearing Restrictions Per Provider Order: No     Mobility  Bed Mobility Overal bed mobility: Needs Assistance Bed Mobility: Supine to Sit     Supine to sit: Max assist, +2 for physical assistance, HOB elevated, +2 for safety/equipment      General bed mobility comments: assist for LE movement towards edge of bed, rolling and truncal elevation; globally weak throughout all extremities and core    Transfers Overall transfer level: Needs assistance   Transfers: Bed to chair/wheelchair/BSC            Lateral/Scoot Transfers: Max assist, Total assist, +2 physical assistance, +2 safety/equipment General transfer comment: extensive assist for forward weight shift, trunk control and lateral movement between seating surfaces    Ambulation/Gait               General Gait Details: unsafe/unable   Stairs             Wheelchair Mobility     Tilt Bed    Modified Rankin (Stroke Patients Only)       Balance Overall balance assessment: Needs assistance Sitting-balance support: No upper extremity supported, Feet supported Sitting balance-Leahy Scale: Poor Sitting balance - Comments: mod/max assist initially, improving to brief periods of close sup once positioned to midline and accommodated to position.  Poor righting reactions to any movement/balance loss outside immediate BOS, requiring physical assist from therapist for recovery                                    Communication Communication Communication: Impaired Factors Affecting Communication: Reduced clarity of speech  Cognition Arousal: Alert Behavior During Therapy: WFL for tasks assessed/performed   PT - Cognitive impairments: Difficult to assess                       PT - Cognition Comments: Generally hypophonic, but able to communicate basic wants/needs and answer  simple questions with increased time.  Delayed processing and task initiation, but good interaction and willingness throughout session Following commands: Impaired Following commands impaired: Follows one step commands with increased time    Cueing Cueing Techniques: Verbal cues, Tactile cues, Visual cues  Exercises      General Comments         Pertinent Vitals/Pain Pain Assessment Pain Assessment: No/denies pain    Home Living                          Prior Function            PT Goals (current goals can now be found in the care plan section) Acute Rehab PT Goals Patient Stated Goal: to be able to get up and go home PT Goal Formulation: With patient Time For Goal Achievement: 07/21/24 Potential to Achieve Goals: Fair Progress towards PT goals: Progressing toward goals    Frequency    Min 2X/week      PT Plan      Co-evaluation PT/OT/SLP Co-Evaluation/Treatment: Yes Reason for Co-Treatment: Complexity of the patient's impairments (multi-system involvement) PT goals addressed during session: Mobility/safety with mobility OT goals addressed during session: ADL's and self-care      AM-PAC PT 6 Clicks Mobility   Outcome Measure  Help needed turning from your back to your side while in a flat bed without using bedrails?: A Lot Help needed moving from lying on your back to sitting on the side of a flat bed without using bedrails?: A Lot Help needed moving to and from a bed to a chair (including a wheelchair)?: A Lot Help needed standing up from a chair using your arms (e.g., wheelchair or bedside chair)?: Total Help needed to walk in hospital room?: Total Help needed climbing 3-5 steps with a railing? : Total 6 Click Score: 9    End of Session   Activity Tolerance: Patient tolerated treatment well Patient left: in chair;with call bell/phone within reach;with chair alarm set;with family/visitor present Nurse Communication: Mobility status (hoyer sling placed in room for return to bed with nursing as needed) PT Visit Diagnosis: Muscle weakness (generalized) (M62.81);Unsteadiness on feet (R26.81)     Time: 8558-8495 PT Time Calculation (min) (ACUTE ONLY): 23 min  Charges:    $Therapeutic Activity: 8-22 mins PT General Charges $$ ACUTE PT VISIT: 1 Visit                     Marx Doig H.  Delores, PT, DPT, NCS 07/09/2024, 5:12 PM [id number]

## 2024-07-09 NOTE — Progress Notes (Addendum)
 Brief Progress Note:  Patient with sudden onset episodes of unresponsiveness that lasted 30 seconds, one observed by me. Patient unresponsive and was confused after. My highest suspicion is for an absence seizure. Ordered 2 mg IV lorazepam , 1 gram levetiracetam  load, and 500 mg levetiracetam  bid. Will order EEG for AM and consult with neurology in AM.  Extra cc time: 15 minutes  [name], MD [facility] 07/09/2024 4:58 PM

## 2024-07-09 NOTE — Evaluation (Signed)
 Clinical/Bedside Swallow Evaluation Patient Details  Name: [name] Phillips MRN: [id number] Date of Birth: 12-05-1980  Today's Date: 07/09/2024 Time: SLP Start Time (ACUTE ONLY): 1315 SLP Stop Time (ACUTE ONLY): 1415 SLP Time Calculation (min) (ACUTE ONLY): 60 min  Past Medical History:  Past Medical History:  Diagnosis Date   Alcohol  abuse 11/02/2018   Hypertension    Seizure [facility])    sees dr maree at [facility], no longer has them. 9 years ago.   Past Surgical History:  Past Surgical History:  Procedure Laterality Date   CARPAL TUNNEL RELEASE Right 04/11/2021   Procedure: CARPAL TUNNEL RELEASE ENDOSCOPIC;  Surgeon: Edie Norleen PARAS, MD;  Location: [facility] ORS;  Service: Orthopedics;  Laterality: Right;  Pateint requesting 1st case of the day   CERVICAL SPINE SURGERY  2019   CHOLECYSTECTOMY     HPI:  Pt is a 43 y/o female with h/o anxiety, HTN, MDD, HCV, hepatic steatosis, HLD, GERD, seizure disorder, Polysubstance abuse, recent use of fentanyl  and heroin 48 hours ago, who presents to the hospital with progressively worsening short of breath, hypoxia.  Patient states that she had upper respiratory infection about a week ago, had a high fever on the first day of infection.  Since then, she has progressively worsening shortness of breath w/ hypoxia.  She also has a cough with large amount of yellow mucus.   She admitted with bacterial PNA, septic shock, bactermia and AKI.  She was placed on HFNC O2 support at admit, then orally intubated d/t further pulmonary decline on 12/14.  She extubated on 12/25.   Chest imaging at admit: Patchy airspace disease and consolidation in the lungs  bilaterally. There is dense consolidation of the right upper and  lower lobes, concerning for pneumonia.  2. Trace to small right pleural effusion.  This has improve per recent imaging.    Assessment / Plan / Recommendation  Clinical Impression   Pt was seen for BSE today. Pt was awake, verbally responded to direct questions.  Speech c/b Poor breath support and significantly decreased volume(recent Lengthy oral intubation; extubated 12/25). Noted min Ataxic-appearing presentation; pt's hands are rigid Bilaterally w/ poor grip effort/ability. Noted open-mouth posture at rest. Basic Y/N questions re: self are appropriate. Poor Stamina and increased WOB noted w/ any exertion. Often yawning. Noted Vitals during session.  HFNC 40L at 40% fio2; HR 111-113; RR low 20s; 99% O2 sats. Afebrile; WBC elevated.  Pt appears to present w/ oropharyngeal phase Dysphagia w/ suspected neuromuscular deficits. Pt consumed po trials of Single ice chips w/ inconsistent, overt clinical s/s of aspiration noted during the trials; oral phase deficits present.  Pt appears at HIGH risk for aspiration for oral intake at this time d/t Significantly declined Pulmonary status and overall deconditioned presentation. Pt has challenging factors that could impact her oropharyngeal swallowing to include Lengthy hospitalization/illness, Polysubstance use/abuse, oral Intubation w/ recent Extubation on 07/08/2024, severe deconditioning/weakness, Pulmonary decline w/ Poor Stamina, and dependency on being fed d/t weakened UEs. These factors can increase risk for aspiration, dysphagia as well as decreased oral intake overall.   During po trials of Single ice chips fed to her (post oral care), pt exhibited overt coughing and throat clearing 1x each w/ 5 trials given. Laryngeal excursion mildly reduced during the swallow. No sustained increased RR w/ the effort but mild increase in RR was noted briefly post swallows. No decline in vocal quality nor change in O2 sats(99-100% during/post). Rest Breaks were given b/t trials to maintain calm  breathing and reduce WOB. Oral phase revealed slow bolus management and mastication, open-mouth posture intermittently w/ boluses, and decreased control of bolus propulsion for A-P transfer for swallowing resulting in anterior  leakage/drooling(min). Oral clearing achieved w/ f/u dry swallow. OM Exam revealed overall Weak oral musculature but No unilateral weakness noted. Speech intelligible but Poor breath support.   Recommend continue NPO status d/t HIGH risk for aspiration/aspiration pneumonia at this time. Recommend frequent oral care daily and oral care prior to therapeutic ice chips w/ NSG Supervision and w/ Speech Therapy. Recommend strict aspiration precautions w/ ice chips- single chips, sitting fully upright w/ head forward, rest breaks b/t trials. Stop giving chips if s/s of aspiration noted.   ST services will continue to follow pt's status while admitted for ongoing assessment, trials to upgrade diet, and possible need for MBSS to objectively assess swallow function. Recommend Dietician f/u for nutrition support- alternative means of feeding may be appropriate at this time d/t pt's overall presentation and length of time needed for recovery. Recommend frequent oral care for hygiene and stimulation of swallowing. POC was d/w pt/Family in room; both agreed.  SLP Visit Diagnosis: Dysphagia, oropharyngeal phase (R13.12) (suspect impact from lengthy illness/intubation/hospitalization; Polysubstance abuse/use; severely deconditioned and weak; Pulmonary decline)    Aspiration Risk  Moderate aspiration risk;Severe aspiration risk;Risk for inadequate nutrition/hydration    Diet Recommendation   NPO (therapeutic ice chips w/ NSG Supervision) = frequent oral care daily and oral care prior to therapeutic ice chips w/ NSG Supervision and w/ Speech Therapy w/ strict aspiration precautions   Medication Administration: Via alternative means    Other Recommendations Recommended Consults:  (Dietician for nutrition support) Oral Care Recommendations: Oral care QID;Oral care prior to ice chip/H20;Staff/trained caregiver to provide oral care Caregiver Recommendations:  (tbd)     Swallow Evaluation Recommendations Caregiver  Recommendations:  (tbd)   Assistance Recommended at Discharge  FULL  Functional Status Assessment Patient has had a recent decline in their functional status and demonstrates the ability to make significant improvements in function in a reasonable and predictable amount of time.  Frequency and Duration min 2x/week  2 weeks       Prognosis Prognosis for improved oropharyngeal function: Fair (-Good) Barriers to Reach Goals: Time post onset;Severity of deficits (Withdrawal; Polysubstance abuse) Barriers/Prognosis Comment: suspect impact from lengthy illness/intubation/hospitalization; Polysubstance abuse/use; severely deconditioned and weak; Pulmonary decline      Swallow Study   General Date of Onset: 06/27/24 HPI: Pt is a 43 y/o female with h/o anxiety, HTN, MDD, HCV, hepatic steatosis, HLD, GERD, seizure disorder, Polysubstance abuse, recent use of fentanyl  and heroin 48 hours ago, who presents to the hospital with progressively worsening short of breath, hypoxia.  Patient states that she had upper respiratory infection about a week ago, had a high fever on the first day of infection.  Since then, she has progressively worsening shortness of breath w/ hypoxia.  She also has a cough with large amount of yellow mucus.   She admitted with bacterial PNA, septic shock, bactermia and AKI.  She was placed on HFNC O2 support at admit, then orally intubated d/t further pulmonary decline on 12/14.  She extubated on 12/25.   Chest imaging at admit: Patchy airspace disease and consolidation in the lungs  bilaterally. There is dense consolidation of the right upper and  lower lobes, concerning for pneumonia.  2. Trace to small right pleural effusion.  This has improve per recent imaging. Type of Study: Bedside Swallow Evaluation Previous  Swallow Assessment: none Diet Prior to this Study: NPO Temperature Spikes Noted: No (wbc 13.2; HR 113) Respiratory Status: Nasal cannula;Increased respiratory rate (HFNC  40L; fio2 40%) History of Recent Intubation: Yes Total duration of intubation (days): 11 days Date extubated: 07/08/24 Behavior/Cognition: Alert;Cooperative;Pleasant mood;Distractible;Requires cueing (min drowsy?) Oral Cavity Assessment: Excessive secretions (min) Oral Care Completed by SLP: Yes Oral Cavity - Dentition: Adequate natural dentition Vision:  (n/a) Self-Feeding Abilities: Total assist Patient Positioning: Upright in bed (full assist) Baseline Vocal Quality: Low vocal intensity (Poor Breath Support s/p intubation/extubation) Volitional Cough: Congested (Fair+) Volitional Swallow: Able to elicit    Oral/Motor/Sensory Function Overall Oral Motor/Sensory Function: Within functional limits (no unilateral weakness)   Ice Chips Ice chips: Impaired Presentation: Spoon (fed; 5 trials) Oral Phase Impairments: Reduced labial seal;Poor awareness of bolus;Reduced lingual movement/coordination (min reduced bolus manipulation overall d/t effort/exertion required) Oral Phase Functional Implications: Prolonged oral transit;Oral residue (min) Pharyngeal Phase Impairments: Suspected delayed Swallow;Cough - Delayed (1x)   Thin Liquid Thin Liquid: Not tested    Nectar Thick Nectar Thick Liquid: Not tested   Honey Thick Honey Thick Liquid: Not tested   Puree Puree: Not tested   Solid     Solid: Not tested        [name], MS, [name] Speech Language Pathologist Rehab Services; [facility] [place] [phone] (ascom) Morgyn Marut 07/09/2024,2:51 PM

## 2024-07-09 NOTE — Progress Notes (Signed)
 PHARMACY CONSULT NOTE  Pharmacy Consult for Electrolyte Monitoring and Replacement   Recent Labs: Potassium (mmol/L)  Date Value  07/09/2024 3.8  10/05/2013 3.5   Magnesium  (mg/dL)  Date Value  87/74/7974 1.7  01/29/2013 2.1   Calcium  (mg/dL)  Date Value  87/73/7974 9.3   Calcium , Total (mg/dL)  Date Value  96/75/7984 9.1   Albumin (g/dL)  Date Value  87/73/7974 3.3 (L)  10/05/2013 4.3   Phosphorus (mg/dL)  Date Value  87/73/7974 5.0 (H)  07/21/2012 2.1 (L)   Sodium (mmol/L)  Date Value  07/09/2024 141  10/05/2013 135 (L)    Assessment: 43 y.o. female with medical history significant of essential hypertension, seizure disorder, polysubstance abuse, recent use of fentanyl  and heroin, who presents to the hospital with progressively worsening short of breath, hypoxia.  Pharmacy is asked to follow and replace electrolytes while in CCU  Patient was extubated 12/25. Pending swallow screen  Goal of Therapy:  Electrolytes WNL  Plan:  --No electrolyte replacement indicated at this time --Follow-up electrolytes with AM labs tomorrow  [name] Phillips 07/09/2024 7:49 AM

## 2024-07-09 NOTE — Plan of Care (Signed)
" °  Problem: Education: Goal: Knowledge of General Education information will improve Description: Including pain rating scale, medication(s)/side effects and non-pharmacologic comfort measures Outcome: Progressing   Problem: Health Behavior/Discharge Planning: Goal: Ability to manage health-related needs will improve Outcome: Progressing   Problem: Clinical Measurements: Goal: Ability to maintain clinical measurements within normal limits will improve Outcome: Progressing Goal: Diagnostic test results will improve Outcome: Progressing Goal: Respiratory complications will improve Outcome: Progressing Goal: Cardiovascular complication will be avoided Outcome: Progressing   Problem: Activity: Goal: Risk for activity intolerance will decrease Outcome: Progressing   Problem: Coping: Goal: Level of anxiety will decrease Outcome: Progressing   Problem: Pain Managment: Goal: General experience of comfort will improve and/or be controlled Outcome: Progressing   Problem: Safety: Goal: Ability to remain free from injury will improve Outcome: Progressing   "

## 2024-07-09 NOTE — Progress Notes (Signed)
 "  NAME:  [name] Phillips, MRN:  [id number], DOB:  25-Nov-1980, LOS: 12 ADMISSION DATE:  06/27/2024, CONSULTATION DATE:  06/27/2024 CHIEF COMPLAINT:  Acute Respiratory Failure    Brief Pt Description / Synopsis:  43 y.o. female with PMHx most significant for polysubstance abuse admitted with Severe Sepsis with Septic Shock and Acute Hypoxic Respiratory Failure due to Strep Pneumo Bacteremia from Strep Pneumo Pneumonia, along with Acute Metabolic Encephalopathy requiring intubation and mechanical ventilation.   History of Present Illness:  43 year old female with history of polysubstance use (IVDU with fentanyl , last injection 4 days ago) who presents with increased shortness of breath and admitted for management of pneumonia. +COCAINE   Patient with worsening respiratory status with increased work of breathing, worsening hypoxia, and increased tachycardia. With her very severe pneumonia we would need to escalate therapy further with intubation, mechanical ventilation, and consideration for bronchoscopy to clear airway secretions. Discussed with the patient and her husband at bedside and she consents to proceed.   PCCM asked to admit for further workup and treatment.  Please see Significant Hospital Events section below for full detailed hospital course.   Pertinent  Medical History  Hypertension Seizure Alcohol  Use Polysubstance abuse  Micro Data:  12/14 BLOOD CX + STREP PNEUMONIAE 12/14: Strep pneumo urinary antigen + 12/14 Respiratory Viral Panel 20 Pathogen >> negative 12/14 Aspergillus >> negative 12/14 Histoplasma Antigen >> negative 12/14 Fungitell Beta D Glucan >> negative 12/14 Pneumocystitis smear >> negative 12/14 Fungal Culture >> negative 12/14: Tracheal aspirate>> Strep Pneumoniae 12/16: Repeat Blood cultures>> no growth  12/18: BAL>> no growth   Antimicrobials:   Anti-infectives (From admission, onward)    Start     Dose/Rate Route Frequency Ordered Stop    07/08/24 1000  cefTRIAXone  (ROCEPHIN ) 2 g in sodium chloride  0.9 % 100 mL IVPB        2 g 200 mL/hr over 30 Minutes Intravenous Every 24 hours 07/05/24 1128     07/01/24 1600  ceFEPIme  (MAXIPIME ) 2 g in sodium chloride  0.9 % 100 mL IVPB        2 g 200 mL/hr over 30 Minutes Intravenous Every 8 hours 07/01/24 1429 07/07/24 1839   07/01/24 1515  linezolid  (ZYVOX ) IVPB 600 mg        600 mg 300 mL/hr over 60 Minutes Intravenous Every 12 hours 07/01/24 1423 07/07/24 2230   06/28/24 1600  cefTRIAXone  (ROCEPHIN ) 2 g in sodium chloride  0.9 % 100 mL IVPB  Status:  Discontinued        2 g 200 mL/hr over 30 Minutes Intravenous Every 24 hours 06/28/24 1104 07/01/24 1423   06/28/24 0600  linezolid  (ZYVOX ) IVPB 600 mg  Status:  Discontinued        600 mg 300 mL/hr over 60 Minutes Intravenous Every 12 hours 06/27/24 2117 06/28/24 1102   06/28/24 0200  piperacillin -tazobactam (ZOSYN ) IVPB 3.375 g  Status:  Discontinued        3.375 g 12.5 mL/hr over 240 Minutes Intravenous Every 8 hours 06/27/24 2322 06/28/24 1104   06/27/24 1500  azithromycin  (ZITHROMAX ) 500 mg in sodium chloride  0.9 % 250 mL IVPB        500 mg 250 mL/hr over 60 Minutes Intravenous Every 24 hours 06/27/24 1403 07/02/24 2359   06/27/24 1415  linezolid  (ZYVOX ) IVPB 600 mg  Status:  Discontinued        600 mg 300 mL/hr over 60 Minutes Intravenous Every 12 hours 06/27/24 1403 06/27/24 2117  06/27/24 1400  piperacillin -tazobactam (ZOSYN ) IVPB 3.375 g  Status:  Discontinued        3.375 g 12.5 mL/hr over 240 Minutes Intravenous Every 8 hours 06/27/24 1321 06/27/24 2322   06/27/24 1115  azithromycin  (ZITHROMAX ) 500 mg in sodium chloride  0.9 % 250 mL IVPB  Status:  Discontinued        500 mg 250 mL/hr over 60 Minutes Intravenous  Once 06/27/24 1102 06/27/24 1408   06/27/24 1115  cefTRIAXone  (ROCEPHIN ) 1 g in sodium chloride  0.9 % 100 mL IVPB        1 g 200 mL/hr over 30 Minutes Intravenous  Once 06/27/24 1102 06/27/24 1200        Significant Hospital Events: Including procedures, antibiotic start and stop dates in addition to other pertinent events   12/14: admit with right sided pneumonia and respiratory failure. CXR with right lung white out, INTUBATED, S/p BRONCH, ART LINE PLACED 12/14: BLOOD CX +STREP PNEUMONIA 12/15: remains on vent 12/16: remains on vent 12/17: remains on vent, severe hypoxia, CVL placed 12/18: severe Hypoxia s/p BRONCH mucoid secretions, worsening CXR, ABX broadened to cover Pseudomonas and MRSA 12/19: severe hypoxia 12/21: attempt sedation wean, diurese 12/22: Remains critically ill requiring mechanical ventilation, levophed  and multiple sedation agents. Diuresed with Lasix  40mg  yesterday; 5.9L UO. Will attempt at [facility] today and wean vent requirements if able 12/23: Remains critically ill requiring mechanical ventilation. Still on levophed , propofol , dilaudid  and precedex . O2 requirements back to 60%; went up to 100% yesterday d/t ETT progressing into right mainstem and needed retraction. Required versed  early this am for acute agitation when trying to get a bath. Wean vent settings as able. Will hold of on weaning sedation today as she is awake on current sedation requirements 12/24: Remains critically ill requiring mechanical ventilation. Low pressor requirements and decreasing sedation needs. Wean vent settings as able. Awake and able to answer questions appropriately. ENT following for possible trach placement next week. 12/25: Extubated to HHFNC, requiring Precedex  for anxiety/delirium. 12/26: Tolerating extubation, wean HHFNC as able.  Wean Precedex  as able, start Clonidine  to assist with weaning.  Speech evaluation pending.   Interim History / Subjective:  As outlined above under Significant Hospital Events section  Objective   Blood pressure 111/60, pulse 71, temperature 98.3 F (36.8 C), temperature source Oral, resp. rate (!) 22, height 5' 4.02 (1.626 m), weight 73.5 kg, last  menstrual period 06/08/2024, SpO2 93%.    Vent Mode: PSV FiO2 (%):  [35 %-50 %] 50 % PEEP:  [5 cmH20] 5 cmH20 Pressure Support:  [5 cmH20] 5 cmH20   Intake/Output Summary (Last 24 hours) at 07/09/2024 0848 Last data filed at 07/09/2024 0700 Gross per 24 hour  Intake 1335.36 ml  Output 4150 ml  Net -2814.64 ml   Filed Weights    Examination: General: Acutely ill appearing female, laying in bed, sedated on precedex , in NAD HENT: Atraumatic, normocephalic, neck supple, no JVD Lungs: Coarse breath sounds, even, nonlabored, normal effort Cardiovascular: Tachycardia, regular rhythm, s1s2, no M/R/G Abdomen: Soft, nontender, nondistended, no guarding or rebound tenderness, BS+ x4 Extremities: Normal bulk and tone, no deformities, + edema BLE, good perfusion  Neuro: Sedated on Precedex , will arouse and follow commands GU: Deferred   Resolved Hospital Problem list     Assessment & Plan:   #Acute Hypoxic Respiratory Failure due to Strep Pneumo Pneumonia EXTUBATED 12/25 -Supplemental O2 as needed to maintain O2 sats >92% -BiPAP if needed  -Follow intermittent Chest X-ray & ABG as  needed -Bronchodilators  -ABX as above -Diuresis as BP and renal function permits ~ will give 20 mg IV Lasix  x1 dose 12/26 -Pulmonary toilet as able  #Severe Sepsis secondary to ... #Strep Pneumo Bacteremia #Strep Pneumo Pneumonia -Monitor fever curve -Trend WBC's & Procalcitonin -Follow cultures as above -Continue empiric Ceftriaxone  pending cultures & sensitivities (plan for 14 day course)  #Septic Shock ~ RESOLVED  12/14 ECHO: LVEF 65-70% with normal function; RV normal size and function; mildly dilated LA; mild mitral valve regurgitation; no AR; IVC normal size with >50% variability  -Continuous cardiac monitoring -Maintain MAP >65 -Vasopressors as needed to maintain MAP goal ~ weaned off  #Hypokalemia -Monitor I&O's / urinary output -Follow BMP -Ensure adequate renal perfusion -Avoid  nephrotoxic agents as able -Replace electrolytes as indicated ~ Pharmacy following for assistance with electrolyte replacement  #Anemia, Critical illness #Thrombocytosis, reactive -Monitor for S/Sx of bleeding -Trend CBC -Lovenox  for VTE Prophylaxis  -Transfuse for Hgb <7 -Transfuse Platelets for Platelet count < 10K; < 50K with active bleeding; < 100K with Neurosurgical procedures/processes   #Acute Metabolic Encephalopathy #Polysubstance abuse  -Treatment of metabolic derangements as outlined above -Provide supportive care -Promote normal sleep/wake cycle and family presence -Avoid sedating medications as able -Precedex  as needed, wean as tolerated ~ start Clonidine  to assist with weaning  -Mobilize as able with PT/OT; Speech evaluation pending     Best Practice (right click and Reselect all SmartList Selections daily)   Diet/type: NPO DVT prophylaxis: LMWH GI prophylaxis: H2B Lines: Central line and yes and it is still needed Foley:  N/A Code Status:  full code Last date of multidisciplinary goals of care discussion [12/26]  12/26: Pt and husband updated at bedside on plan of care.  Labs   CBC: Recent Labs  Lab 07/05/24 0400 07/06/24 0245 07/07/24 0502 07/08/24 0449 07/09/24 0420  WBC 26.3* 22.4* 19.0* 12.1* 13.2*  HGB 8.5* 8.2* 8.0* 7.9* 8.7*  HCT 26.9* 25.6* 25.3* 24.3* 27.7*  MCV 96.1 95.9 94.4 94.6 96.2  PLT 415* 407* 425* 491* 663*    Basic Metabolic Panel: Recent Labs  Lab 07/05/24 0400 07/05/24 1720 07/06/24 0245 07/06/24 1541 07/07/24 0502 07/07/24 1259 07/07/24 2003 07/08/24 0449 07/09/24 0420  NA 143 144 142   < > 141 141 141 141 141  K 3.0* 4.2 4.3   < > 3.8 4.4 3.7 3.3* 3.8  CL 105 104 106   < > 104 102 101 102 103  CO2 29 30 29    < > 28 27 29 29 28   GLUCOSE 163* 184* 147*   < > 87 181* 169* 95 108*  BUN 25* 26* 22*   < > 23* 24* 22* 21* 22*  CREATININE 0.34* 0.45 0.37*   < > 0.31* 0.48 0.35* 0.32* 0.39*  CALCIUM  8.4* 8.7* 8.7*   <  > 9.1 9.4 8.8* 9.1 9.3  MG 1.7 2.0 1.8  --  1.9  --   --  1.7  --   PHOS 2.8  --  3.2  --  3.9  --   --  4.2 5.0*   < > = values in this interval not displayed.   GFR: Estimated Creatinine Clearance: 87.2 mL/min (A) (by C-G formula based on SCr of 0.39 mg/dL (L)). Recent Labs  Lab 07/06/24 0245 07/07/24 0502 07/08/24 0449 07/09/24 0420  WBC 22.4* 19.0* 12.1* 13.2*    Liver Function Tests: Recent Labs  Lab 07/05/24 0400 07/06/24 0245 07/07/24 0502 07/08/24 0449 07/09/24 0420  ALBUMIN  2.5* 2.7* 2.7* 2.8* 3.3*   No results for input(s): LIPASE, AMYLASE in the last 168 hours. No results for input(s): AMMONIA in the last 168 hours.  ABG    Component Value Date/Time   PHART 7.5 (H) 07/04/2024 1242   PCO2ART 43 07/04/2024 1242   PO2ART 63 (L) 07/04/2024 1242   HCO3 33.5 (H) 07/04/2024 1242   ACIDBASEDEF 3.2 (H) 06/28/2024 0544   O2SAT 95.8 07/04/2024 1242     Coagulation Profile: No results for input(s): INR, PROTIME in the last 168 hours.  Cardiac Enzymes: No results for input(s): CKTOTAL, CKMB, CKMBINDEX, TROPONINI in the last 168 hours.  HbA1C: Hemoglobin A1C  Date/Time Value Ref Range Status  07/21/2012 04:48 AM 4.8 4.2 - 6.3 % Final    Comment:    The American Diabetes Association recommends that a primary goal of therapy should be <7% and that physicians should reevaluate the treatment regimen in patients with HbA1c values consistently >8%.    Hgb A1c MFr Bld  Date/Time Value Ref Range Status  06/29/2024 05:04 AM 6.0 (H) 4.8 - 5.6 % Final    Comment:    (NOTE) Diagnosis of Diabetes The following HbA1c ranges recommended by the American Diabetes Association (ADA) may be used as an aid in the diagnosis of diabetes mellitus.  Hemoglobin             Suggested A1C NGSP%              Diagnosis  <5.7                   Non Diabetic  5.7-6.4                Pre-Diabetic  >6.4                   Diabetic  <7.0                   Glycemic  control for                       adults with diabetes.      CBG: Recent Labs  Lab 07/08/24 1547 07/08/24 1918 07/09/24 0001 07/09/24 0353 07/09/24 0746  GLUCAP 182* 95 91 94 104*    Review of Systems:   Positives in BOLD: Gen: Denies fever, chills, weight change, fatigue, night sweats, anxiety  HEENT: Denies blurred vision, double vision, hearing loss, tinnitus, sinus congestion, rhinorrhea, sore throat, neck stiffness, dysphagia PULM: Denies shortness of breath, cough, sputum production, hemoptysis, wheezing CV: Denies chest pain, edema, orthopnea, paroxysmal nocturnal dyspnea, palpitations GI: Denies abdominal pain, nausea, vomiting, diarrhea, hematochezia, melena, constipation, change in bowel habits GU: Denies dysuria, hematuria, polyuria, oliguria, urethral discharge Endocrine: Denies hot or cold intolerance, polyuria, polyphagia or appetite change Derm: Denies rash, dry skin, scaling or peeling skin change Heme: Denies easy bruising, bleeding, bleeding gums Neuro: Denies headache, numbness, weakness, slurred speech, loss of memory or consciousness   Past Medical History:  She,  has a past medical history of Alcohol  abuse (11/02/2018), Hypertension, and Seizure (HCC).   Surgical History:   Past Surgical History:  Procedure Laterality Date   CARPAL TUNNEL RELEASE Right 04/11/2021   Procedure: CARPAL TUNNEL RELEASE ENDOSCOPIC;  Surgeon: Edie Norleen PARAS, MD;  Location: [facility] ORS;  Service: Orthopedics;  Laterality: Right;  Pateint requesting 1st case of the day   CERVICAL SPINE SURGERY  2019   CHOLECYSTECTOMY  Social History:   reports that she quit smoking about 4 years ago. Her smoking use included cigarettes and e-cigarettes. She started smoking about 29 years ago. She has a 25 pack-year smoking history. She has never used smokeless tobacco. She reports that she does not currently use alcohol . She reports that she does not currently use drugs.   Family History:   Her family history includes Alcohol  abuse in an other family member; Breast cancer (age of onset: 82) in her maternal aunt; COPD in her father; Heart disease in her father; Hyperlipidemia in her mother; Hypertension in her father and mother.   Allergies Allergies[1]   Home Medications  Prior to Admission medications  Medication Sig Start Date End Date Taking? Authorizing Provider  atorvastatin  (LIPITOR) 10 MG tablet Take 1 tablet (10 mg total) by mouth daily. MUST HAVE OV FOR FURTHER REFILLS 10/29/23  Yes Dugal, Ginger, FNP  Buprenorphine  HCl-Naloxone  HCl 2-0.5 MG FILM Place 0.5 Film under the tongue daily. 06/17/24  Yes [provider]  cetirizine (ZYRTEC) 10 MG tablet Take 10 mg by mouth daily.   Yes [provider]  cloNIDine  (CATAPRES ) 0.1 MG tablet Take 0.1 mg by mouth 3 (three) times daily as needed.   Yes [provider]  gabapentin (NEURONTIN) 300 MG capsule Take 300 mg by mouth 3 (three) times daily as needed. 04/21/24  Yes [provider]  hydrOXYzine  (VISTARIL ) 25 MG capsule Take 25 mg by mouth 3 (three) times daily. 06/17/24  Yes [provider]  Melatonin 12 MG TABS Take 3 mg by mouth at bedtime.   Yes [provider]  Multiple Vitamin (MULTI-VITAMIN) tablet Take 1 tablet by mouth daily.   Yes [provider]  omeprazole  (PRILOSEC) 20 MG capsule Take 1 capsule (20 mg total) by mouth daily. 10/29/23  Yes Dugal, Tabitha, FNP  sertraline  (ZOLOFT ) 100 MG tablet Take two tablets once daily 11/03/23  Yes Corwin Ginger, FNP     Critical care time: 40 minutes     Inge Lecher, AGACNP-BC [facility] Prefer epic messenger for cross cover needs If after hours, please call E-link       [1]  Allergies Allergen Reactions   Varenicline  Tartrate Other (See Comments)    varenicline    Wellbutrin  [Bupropion ]     Suicidal thoughts    "

## 2024-07-09 NOTE — Progress Notes (Addendum)
 Nutrition Follow Up Note   DOCUMENTATION CODES:   Not applicable  INTERVENTION:   Recommend dobhoff tube placement and nutrition support   RD will add supplements with diet advancement   If feeding tube placed, recommend:  Osmolite 1.5@60ml /hr- Initiate at 51ml/hr and increase by 10ml/hr q 8 hours until goal rate is reached.   ProSource TF 20- Give 60ml daily via tube, each supplement provides 80kcal and 20g of protein.   Free water  flushes 30ml q4 hours to maintain tube patency   Regimen provides 2240kcal/day, 110g/day protein and 1266ml/day of free water .   MVI, thiamine  and folic acid  po daily via tube  Cholecalciferol  2,000 daily via tube x 30 days   Daily weights   NUTRITION DIAGNOSIS:   Inadequate oral intake related to inability to eat (pt sedated and ventilated) as evidenced by NPO status. -ongoing   GOAL:   Patient will meet greater than or equal to 90% of their needs -not met   MONITOR:   Diet advancement, Labs, Weight trends, Skin, I & O's  ASSESSMENT:   43 y/o female with h/o anxiety, HTN, MDD, HCV, hepatic steatosis, HLD, GERD, seizures, etoh and substance abuse who is admitted with bacterial PNA, septic shock, bactermia and AKI.  Pt extubated 12/25. Pt remains NPO per SLP. Recommend dobhoff tube placement and nutrition support; this was discussed with medical team. RD will add supplements with diet advancement. Pt remains at refeed risk. No new weight since admit as bed scale is broken. Pt +8.3L on her I & Os.   Medications reviewed and include: cholecalciferol , lovenox , pepcid , folic acid , insulin , oxycodone , thiamine , ceftriaxone   Labs reviewed: K 3.8 wnl, BUN 22(H), creat 0.39(H), P 5.0(H) Mg 1.7 wnl- 12/25 Vitamin D - 27.3(L)- 12/16 Wbc- 13.2(H), Hgb 8.7(L), Hct 27.7(L) Cbgs- 99, 104, 94 x 24 hrs   UOP-   Diet Order:   Diet Order             Diet NPO time specified  Diet effective now                  EDUCATION NEEDS:   No  education needs have been identified at this time  Skin:  Skin Assessment: Reviewed RN Assessment (MASD)  Last BM:  12/26- type 7  Height:   Ht Readings from Last 1 Encounters:  07/04/24 5' 4.02 (1.626 m)    Weight:   Wt Readings from Last 1 Encounters:  10/29/23 78.9 kg    Ideal Body Weight:  54.5 kg  BMI:  Body mass index is 27.79 kg/m.  Estimated Nutritional Needs:   Kcal:  1900-2200kcal/day  Protein:  95-110g/day  Fluid:  1.7-1.9L/day  [name] Shams MS, RD, LDN If unable to be reached, please send secure chat to RD inpatient available from 8:00a-4:00p daily

## 2024-07-09 NOTE — Progress Notes (Addendum)
 Occupational Therapy Treatment Patient Details Name: [name] Phillips MRN: [id number] DOB: Jan 08, 1981 Today's Date: 07/09/2024   History of present illness Patient is a 43 year old female with acute hypoxic respiratory failure, severe strep pneumo pneumonia, strep pneumo bacteremia, septic shock. History of polysubstance and IV drug use, on suboxone  prior to presentation   OT comments  Chart reviewed to date, pt greeted semi supine in bed, agreeable to OT tx session targeting improving functional activity tolerance in prep for ADL tasks. Pt requires increased time for one step directions and is dysphonic throughout. Pt is making progress towards goals, required MAX-TOTAL A +2 for bed mobility, brief periods of static sitting on edge of bed with CGA. MAX-TOAL A for lateral scoot to bedside chair. Pt positioned with pillows under BUE, NAD. Pt is making progress towards goals, discharge recommendation remains appropriate. OT will continue to follow.   Pt on HHFNC, 40L, Fio2 40, spo2 >90% throughout. HR 122 at start of session, max 128 bpm, to 118 bpm in chair. RR up to 40 with mobility attempts.       If plan is discharge home, recommend the following:  Two people to help with walking and/or transfers;Two people to help with bathing/dressing/bathroom   Equipment Recommendations  Other (comment) (defer to next venue of care)    Recommendations for Other Services Rehab consult    Precautions / Restrictions Precautions Precautions: Fall Recall of Precautions/Restrictions: Impaired Restrictions Weight Bearing Restrictions Per Provider Order: No       Mobility Bed Mobility Overal bed mobility: Needs Assistance Bed Mobility: Supine to Sit     Supine to sit: Max assist, +2 for physical assistance, HOB elevated, +2 for safety/equipment          Transfers Overall transfer level: Needs assistance   Transfers: Bed to chair/wheelchair/BSC            Lateral/Scoot Transfers: Max  assist, Total assist, +2 physical assistance, +2 safety/equipment General transfer comment: frequent cueing for body mechanics, L side of chair lowered for lateral scoot     Balance Overall balance assessment: Needs assistance Sitting-balance support: Feet supported Sitting balance-Leahy Scale: Poor Sitting balance - Comments: brief periods of static sitting with CGA                                   ADL either performed or assessed with clinical judgement   ADL Overall ADL's : Needs assistance/impaired                     Lower Body Dressing: Maximal assistance;Sitting/lateral leans Lower Body Dressing Details (indicate cue type and reason): donn socks Toilet Transfer: Maximal assistance;Total assistance Toilet Transfer Details (indicate cue type and reason): simulated, lateral scoot to bedside chair                Extremity/Trunk Assessment Upper Extremity Assessment Upper Extremity Assessment: RUE deficits/detail;LUE deficits/detail RUE Deficits / Details: spasticity throughout hands, especially in PIP/DIP will continue to assess LUE Deficits / Details: spasticity throughout hands, especially in PIP/DIP will continue to assess            Vision       Perception     Praxis     Communication Communication Communication: Impaired Factors Affecting Communication: Reduced clarity of speech   Cognition Arousal: Alert Behavior During Therapy: [facility] for tasks assessed/performed Cognition: Cognition impaired  Attention impairment (select first level of impairment): Sustained attention Executive functioning impairment (select all impairments): Problem solving OT - Cognition Comments: will continue to assess                 Following commands: Impaired Following commands impaired: Follows one step commands with increased time      Cueing   Cueing Techniques: Verbal cues, Tactile cues, Visual cues  Exercises Other  Exercises Other Exercises: edu pt/family re role of rehab, importance of continued mobility attempts    Shoulder Instructions       General Comments      Pertinent Vitals/ Pain       Pain Assessment Pain Assessment: No/denies pain  Home Living                                          Prior Functioning/Environment              Frequency  Min 3X/week        Progress Toward Goals  OT Goals(current goals can now be found in the care plan section)  Progress towards OT goals: Progressing toward goals  Acute Rehab OT Goals Time For Goal Achievement: 08/04/24  Plan      Co-evaluation    PT/OT/SLP Co-Evaluation/Treatment: Yes Reason for Co-Treatment: Complexity of the patient's impairments (multi-system involvement)   OT goals addressed during session: ADL's and self-care      AM-PAC OT 6 Clicks Daily Activity     Outcome Measure   Help from another person eating meals?: A Lot Help from another person taking care of personal grooming?: A Lot Help from another person toileting, which includes using toliet, bedpan, or urinal?: A Lot Help from another person bathing (including washing, rinsing, drying)?: A Lot Help from another person to put on and taking off regular upper body clothing?: A Lot Help from another person to put on and taking off regular lower body clothing?: A Lot 6 Click Score: 12    End of Session Equipment Utilized During Treatment: Oxygen  OT Visit Diagnosis: Unsteadiness on feet (R26.81);Repeated falls (R29.6);Muscle weakness (generalized) (M62.81)   Activity Tolerance Patient tolerated treatment well   Patient Left in chair;with call bell/phone within reach;with family/visitor present   Nurse Communication Mobility status        Time: 8558-8495 OT Time Calculation (min): 23 min  Charges: OT General Charges $OT Visit: 1 Visit OT Treatments $Therapeutic Activity: 8-22 mins  Therisa Sheffield, OTD OTR/L  07/09/2024,  4:20 PM

## 2024-07-10 ENCOUNTER — Inpatient Hospital Stay

## 2024-07-10 DIAGNOSIS — R569 Unspecified convulsions: Secondary | ICD-10-CM

## 2024-07-10 DIAGNOSIS — R410 Disorientation, unspecified: Secondary | ICD-10-CM

## 2024-07-10 DIAGNOSIS — G4089 Other seizures: Secondary | ICD-10-CM | POA: Diagnosis not present

## 2024-07-10 DIAGNOSIS — R6521 Severe sepsis with septic shock: Secondary | ICD-10-CM | POA: Diagnosis not present

## 2024-07-10 DIAGNOSIS — J154 Pneumonia due to other streptococci: Secondary | ICD-10-CM | POA: Diagnosis not present

## 2024-07-10 DIAGNOSIS — A419 Sepsis, unspecified organism: Secondary | ICD-10-CM | POA: Diagnosis not present

## 2024-07-10 DIAGNOSIS — G40909 Epilepsy, unspecified, not intractable, without status epilepticus: Secondary | ICD-10-CM

## 2024-07-10 DIAGNOSIS — G7281 Critical illness myopathy: Secondary | ICD-10-CM | POA: Diagnosis not present

## 2024-07-10 DIAGNOSIS — J9601 Acute respiratory failure with hypoxia: Secondary | ICD-10-CM | POA: Diagnosis not present

## 2024-07-10 LAB — CBC
HCT: 30.5 % — ABNORMAL LOW (ref 36.0–46.0)
Hemoglobin: 9.6 g/dL — ABNORMAL LOW (ref 12.0–15.0)
MCH: 30.7 pg (ref 26.0–34.0)
MCHC: 31.5 g/dL (ref 30.0–36.0)
MCV: 97.4 fL (ref 80.0–100.0)
Platelets: 885 K/uL — ABNORMAL HIGH (ref 150–400)
RBC: 3.13 MIL/uL — ABNORMAL LOW (ref 3.87–5.11)
RDW: 16.1 % — ABNORMAL HIGH (ref 11.5–15.5)
WBC: 16.6 K/uL — ABNORMAL HIGH (ref 4.0–10.5)
nRBC: 0 % (ref 0.0–0.2)

## 2024-07-10 LAB — BASIC METABOLIC PANEL WITH GFR
Anion gap: 18 — ABNORMAL HIGH (ref 5–15)
Anion gap: 17 — ABNORMAL HIGH (ref 5–15)
BUN: 21 mg/dL — ABNORMAL HIGH (ref 6–20)
BUN: 20 mg/dL (ref 6–20)
CO2: 23 mmol/L (ref 22–32)
CO2: 20 mmol/L — ABNORMAL LOW (ref 22–32)
Calcium: 9.6 mg/dL (ref 8.9–10.3)
Calcium: 8.7 mg/dL — ABNORMAL LOW (ref 8.9–10.3)
Chloride: 105 mmol/L (ref 98–111)
Chloride: 101 mmol/L (ref 98–111)
Creatinine, Ser: 0.41 mg/dL — ABNORMAL LOW (ref 0.44–1.00)
Creatinine, Ser: 0.36 mg/dL — ABNORMAL LOW (ref 0.44–1.00)
GFR, Estimated: >60 mL/min
GFR, Estimated: >60 mL/min
Glucose, Bld: 112 mg/dL — ABNORMAL HIGH (ref 70–99)
Glucose, Bld: 373 mg/dL — ABNORMAL HIGH (ref 70–99)
Potassium: 3.6 mmol/L (ref 3.5–5.1)
Potassium: 3.3 mmol/L — ABNORMAL LOW (ref 3.5–5.1)
Sodium: 146 mmol/L — ABNORMAL HIGH (ref 135–145)
Sodium: 138 mmol/L (ref 135–145)

## 2024-07-10 LAB — RENAL FUNCTION PANEL
Albumin: 3.5 g/dL (ref 3.5–5.0)
Anion gap: 20 — ABNORMAL HIGH (ref 5–15)
BUN: 23 mg/dL — ABNORMAL HIGH (ref 6–20)
CO2: 23 mmol/L (ref 22–32)
Calcium: 9.7 mg/dL (ref 8.9–10.3)
Chloride: 106 mmol/L (ref 98–111)
Creatinine, Ser: 0.4 mg/dL — ABNORMAL LOW (ref 0.44–1.00)
GFR, Estimated: >60 mL/min
Glucose, Bld: 101 mg/dL — ABNORMAL HIGH (ref 70–99)
Phosphorus: 3.7 mg/dL (ref 2.5–4.6)
Potassium: 3.6 mmol/L (ref 3.5–5.1)
Sodium: 149 mmol/L — ABNORMAL HIGH (ref 135–145)

## 2024-07-10 LAB — TRIGLYCERIDES: Triglycerides: 149 mg/dL

## 2024-07-10 LAB — RESP PANEL BY RT-PCR (RSV, FLU A&B, COVID)  RVPGX2
Influenza A by PCR: NEGATIVE
Influenza B by PCR: NEGATIVE
Resp Syncytial Virus by PCR: NEGATIVE
SARS Coronavirus 2 by RT PCR: NEGATIVE

## 2024-07-10 LAB — GLUCOSE, CAPILLARY
Glucose-Capillary: 79 mg/dL (ref 70–99)
Glucose-Capillary: 102 mg/dL — ABNORMAL HIGH (ref 70–99)
Glucose-Capillary: 108 mg/dL — ABNORMAL HIGH (ref 70–99)
Glucose-Capillary: 129 mg/dL — ABNORMAL HIGH (ref 70–99)
Glucose-Capillary: 114 mg/dL — ABNORMAL HIGH (ref 70–99)
Glucose-Capillary: 124 mg/dL — ABNORMAL HIGH (ref 70–99)

## 2024-07-10 LAB — MAGNESIUM
Magnesium: 1.7 mg/dL (ref 1.7–2.4)
Magnesium: 1.7 mg/dL (ref 1.7–2.4)

## 2024-07-10 LAB — TROPONIN T, HIGH SENSITIVITY
Troponin T High Sensitivity: 28 ng/L — ABNORMAL HIGH (ref 0–19)
Troponin T High Sensitivity: 23 ng/L — ABNORMAL HIGH (ref 0–19)

## 2024-07-10 LAB — MRSA NEXT GEN BY PCR, NASAL: MRSA by PCR Next Gen: NOT DETECTED

## 2024-07-10 LAB — PREGNANCY, URINE: Preg Test, Ur: NEGATIVE

## 2024-07-10 MED ORDER — FOLIC ACID 1 MG PO TABS
1.0000 mg | ORAL_TABLET | Freq: Every day | ORAL | Status: DC
  Administered 2024-07-11 – 2024-07-13 (×3): 1 mg
  Filled 2024-07-10: qty 1

## 2024-07-10 MED ORDER — ALUM & MAG HYDROXIDE-SIMETH 200-200-20 MG/5ML PO SUSP
30.0000 mL | Freq: Once | ORAL | Status: AC
  Administered 2024-07-10: 30 mL
  Filled 2024-07-10: qty 30

## 2024-07-10 MED ORDER — OXYCODONE HCL 5 MG PO TABS
30.0000 mg | ORAL_TABLET | Freq: Four times a day (QID) | ORAL | Status: DC
  Administered 2024-07-11 – 2024-07-13 (×10): 30 mg via NASOGASTRIC
  Filled 2024-07-10 (×4): qty 6

## 2024-07-10 MED ORDER — INSULIN ASPART 100 UNIT/ML IJ SOLN
0.0000 [IU] | INTRAMUSCULAR | Status: DC
  Administered 2024-07-10 – 2024-07-12 (×7): 2 [IU] via SUBCUTANEOUS
  Administered 2024-07-12 – 2024-07-13 (×2): 3 [IU] via SUBCUTANEOUS
  Filled 2024-07-10 (×5): qty 2

## 2024-07-10 MED ORDER — SERTRALINE HCL 50 MG PO TABS
100.0000 mg | ORAL_TABLET | Freq: Every day | ORAL | Status: DC
  Administered 2024-07-10 – 2024-07-12 (×3): 100 mg via NASOGASTRIC
  Filled 2024-07-10 (×2): qty 2

## 2024-07-10 MED ORDER — HYDROXYZINE HCL 25 MG PO TABS
25.0000 mg | ORAL_TABLET | Freq: Three times a day (TID) | ORAL | Status: DC | PRN
  Administered 2024-07-11: 25 mg
  Filled 2024-07-10: qty 1

## 2024-07-10 MED ORDER — FAMOTIDINE IN NACL 20-0.9 MG/50ML-% IV SOLN
20.0000 mg | Freq: Once | INTRAVENOUS | Status: AC
  Administered 2024-07-10: 20 mg via INTRAVENOUS
  Filled 2024-07-10: qty 50

## 2024-07-10 MED ORDER — VITAMIN D 25 MCG (1000 UNIT) PO TABS
2000.0000 [IU] | ORAL_TABLET | Freq: Every day | ORAL | Status: DC
  Administered 2024-07-11 – 2024-07-13 (×3): 2000 [IU] via NASOGASTRIC
  Filled 2024-07-10: qty 2

## 2024-07-10 MED ORDER — DEXTROSE 5 % IV SOLN: INTRAVENOUS | Status: AC

## 2024-07-10 MED ORDER — OSMOLITE 1.5 CAL PO LIQD
1000.0000 mL | ORAL | Status: DC
  Administered 2024-07-10 – 2024-07-12 (×4): 1000 mL

## 2024-07-10 MED ORDER — MAGNESIUM SULFATE 2 GM/50ML IV SOLN
2.0000 g | Freq: Once | INTRAVENOUS | Status: AC
  Administered 2024-07-10: 2 g via INTRAVENOUS
  Filled 2024-07-10: qty 50

## 2024-07-10 MED ORDER — DIAZEPAM 5 MG/ML IJ SOLN
5.0000 mg | Freq: Once | INTRAMUSCULAR | Status: AC
  Administered 2024-07-10: 5 mg via INTRAVENOUS
  Filled 2024-07-10: qty 2

## 2024-07-10 MED ORDER — ALUM & MAG HYDROXIDE-SIMETH 200-200-20 MG/5ML PO SUSP: 30.0000 mL | Freq: Once | ORAL | Status: DC

## 2024-07-10 MED ORDER — THIAMINE HCL 100 MG PO TABS
100.0000 mg | ORAL_TABLET | Freq: Every day | ORAL | Status: DC
  Administered 2024-07-11 – 2024-07-13 (×3): 100 mg via NASOGASTRIC
  Filled 2024-07-10 (×2): qty 1

## 2024-07-10 MED ORDER — MAGNESIUM SULFATE IN D5W 1-5 GM/100ML-% IV SOLN
1.0000 g | Freq: Once | INTRAVENOUS | Status: DC
  Filled 2024-07-10: qty 100

## 2024-07-10 MED ORDER — FREE WATER
30.0000 mL | Status: DC
  Administered 2024-07-10 – 2024-07-12 (×8): 30 mL

## 2024-07-10 MED ORDER — PROSOURCE TF20 ENFIT COMPATIBL EN LIQD
60.0000 mL | Freq: Every day | ENTERAL | Status: DC
  Administered 2024-07-11 – 2024-07-12 (×2): 60 mL
  Filled 2024-07-10: qty 60

## 2024-07-10 MED ORDER — FAMOTIDINE IN NACL 20-0.9 MG/50ML-% IV SOLN: 20.0000 mg | INTRAVENOUS | Status: DC

## 2024-07-10 MED ORDER — ATORVASTATIN CALCIUM 10 MG PO TABS
10.0000 mg | ORAL_TABLET | Freq: Every day | ORAL | Status: DC
  Administered 2024-07-11 – 2024-07-13 (×3): 10 mg via NASOGASTRIC
  Filled 2024-07-10: qty 1

## 2024-07-10 MED ORDER — DIAZEPAM 5 MG/ML IJ SOLN: 2.5000 mg | Freq: Once | INTRAMUSCULAR | Status: DC

## 2024-07-10 NOTE — Procedures (Addendum)
 Patient Name: [name]  MRN: [id number]  Epilepsy Attending: Arlin MALVA Krebs  Referring Physician/Provider: Kathrene Almarie Bake, NP  Duration: 07/10/2024 9391 to 1002  Patient history: 43 yo F with sudden onset episodes of unresponsiveness that lasted 30 seconds, one observed by me. Patient unresponsive and was confused after. EEG to evaluate for seizure  Level of alertness: Awake  AEDs during EEG study: LEV, Ativan   Technical aspects: This EEG was obtained using a 10 lead EEG system positioned circumferentially without any parasagittal coverage (rapid EEG). Computer selected EEG is reviewed as  well as background features and all clinically significant events.  Description: The posterior dominant rhythm consists of 10 Hz activity of moderate voltage (25-35 uV) seen predominantly in posterior head regions, symmetric and reactive to eye opening and eye closing. There is an excessive amount of 13-15 Hz distributed symmetrically and diffusely. Hyperventilation and photic stimulation were not performed.     ABNORMALITY - Excessive beta, generalized  IMPRESSION: This limited ceribell eeg is within normal limits. No seizures or epileptiform discharges were seen throughout the recording.  A normal interictal EEG does not exclude the diagnosis of epilepsy.   Lazlo Tunney O Dola Lunsford

## 2024-07-10 NOTE — Plan of Care (Signed)
  Problem: Education: Goal: Knowledge of General Education information will improve Description: Including pain rating scale, medication(s)/side effects and non-pharmacologic comfort measures Outcome: Progressing   Problem: Health Behavior/Discharge Planning: Goal: Ability to manage health-related needs will improve Outcome: Progressing   Problem: Clinical Measurements: Goal: Ability to maintain clinical measurements within normal limits will improve Outcome: Progressing Goal: Will remain free from infection Outcome: Progressing Goal: Diagnostic test results will improve Outcome: Progressing Goal: Respiratory complications will improve Outcome: Progressing Goal: Cardiovascular complication will be avoided Outcome: Progressing   Problem: Activity: Goal: Risk for activity intolerance will decrease Outcome: Progressing   Problem: Nutrition: Goal: Adequate nutrition will be maintained Outcome: Not Progressing

## 2024-07-10 NOTE — Progress Notes (Signed)
 PHARMACY CONSULT NOTE  Pharmacy Consult for Electrolyte Monitoring and Replacement   Recent Labs: Potassium (mmol/L)  Date Value  07/10/2024 3.6  10/05/2013 3.5   Magnesium  (mg/dL)  Date Value  87/72/7974 1.7  01/29/2013 2.1   Calcium  (mg/dL)  Date Value  87/72/7974 9.7   Calcium , Total (mg/dL)  Date Value  96/75/7984 9.1   Albumin (g/dL)  Date Value  87/72/7974 3.5  10/05/2013 4.3   Phosphorus (mg/dL)  Date Value  87/72/7974 3.7  07/21/2012 2.1 (L)   Sodium (mmol/L)  Date Value  07/10/2024 149 (H)  10/05/2013 135 (L)    Assessment: 43 y.o. female with medical history significant of essential hypertension, seizure disorder, polysubstance abuse, recent use of fentanyl  and heroin, who presents to the hospital with progressively worsening short of breath, hypoxia.  Pharmacy is asked to follow and replace electrolytes while in CCU  Patient was extubated 12/25. Pending swallow screen  Goal of Therapy:  Electrolytes WNL  Plan:  --Will order Magnesium  1g IV x 1 dose.  --No additional replacement warranted at this time.  --Follow-up electrolytes with AM labs tomorrow  Estill CHRISTELLA Lutes, PharmD, BCPS Clinical Pharmacist 07/10/2024 6:41 AM

## 2024-07-10 NOTE — Progress Notes (Signed)
 "  NAME:  [name] Phillips, MRN:  [id number], DOB:  Feb 12, 1981, LOS: 13 ADMISSION DATE:  06/27/2024, CONSULTATION DATE:  06/27/24 REFERRING MD:  Laurita, CHIEF COMPLAINT:  resp failure   History of Present Illness:   43 year old female with history of polysubstance use (IVDU with fentanyl , last injection 4 days ago) who presents with increased shortness of breath and admitted for management of pneumonia.   She reports symptom onset around over a week ago with fevers and chills. This improved with symptomatic management, but she then developed increased shortness of breath and cough over the past couple of days prompting presentation to the ED. She reports a cough productive of yellow sputum. She has not had any sick contacts, nor has she been sick like this in the past.   Patient reports recent travel in November to [place], [place] but denies any hikes or venturing into the desert. She reports recently staying at a hotel here in [place] . She reports a history of IVDU, having used IV fentanyl  4 days ago, and also reports snorting narcotics and vape use. She gets her needles from an exchange program where she also procures the water  used for injection. Denies any history of endocarditis. Pertinent  Medical History   -IVDU -HTN -HLD  Significant Hospital Events: Including procedures, antibiotic start and stop dates in addition to other pertinent events    12/14: admit with right sided pneumonia and respiratory failure. CXR with right lung white out, INTUBATED, S/p BRONCH, ART LINE PLACED 12/14: BLOOD CX +STREP PNEUMONIA 12/15: remains on vent 12/16: remains on vent 12/17: remains on vent, severe hypoxia, CVL placed 12/18: severe Hypoxia s/p BRONCH mucoid secretions, worsening CXR, ABX broadened to cover Pseudomonas and MRSA 12/19: severe hypoxia 12/21: attempt sedation wean, diurese 12/22: Remains critically ill requiring mechanical ventilation, levophed  and multiple sedation agents. Diuresed  with Lasix  40mg  yesterday; 5.9L UO. Will attempt at [facility] today and wean vent requirements if able 12/23: Remains critically ill requiring mechanical ventilation. Still on levophed , propofol , dilaudid  and precedex . O2 requirements back to 60%; went up to 100% yesterday d/t ETT progressing into right mainstem and needed retraction. Required versed  early this am for acute agitation when trying to get a bath. Wean vent settings as able. Will hold of on weaning sedation today as she is awake on current sedation requirements 12/24: Remains critically ill requiring mechanical ventilation. Low pressor requirements and decreasing sedation needs. Wean vent settings as able. Awake and able to answer questions appropriately. ENT following for possible trach placement next week. 12/25: Extubated to HHFNC, requiring Precedex  for anxiety/delirium. Diuresed w 4.3L UOP  12/26: Tolerating extubation, wean HHFNC as able.  Wean precedex , clonidine  started  12/27 remains in ICU on ceribell  Interim History / Subjective:   Overnight some periods of tachypnea Wbc incr to 16.6, plt up from 600 to 885 NA incr to 149  Progressive tachycardia, is now net neg fluid status this admission by almost 3L    Objective    Blood pressure 134/75, pulse (!) 114, temperature 98.4 F (36.9 C), temperature source Oral, resp. rate (!) 39, height 5' 4.02 (1.626 m), weight 73.5 kg, last menstrual period 06/08/2024, SpO2 96%.    FiO2 (%):  [40 %] 40 %   Intake/Output Summary (Last 24 hours) at 07/10/2024 9072 Last data filed at 07/10/2024 0600 Gross per 24 hour  Intake 230.94 ml  Output 1650 ml  Net -1419.06 ml   Filed Weights    Examination: General: acutely  ill middle aged F  Neuro: AAOx3, following commands, generalized weakness  HENT: NCAT HHFNC in place  Lungs: Symmetrical chest expansion,  Cardiovascular: tachycardic, regular Abdomen: soft ndnt hypoactive  Extremities: no acute joint deformity. 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 Does not sound like has had further episodes -has hx c spine surgery, consider abscess but overall less likely   P -ceribell was ordered overnight-- defer duration to neuro  -neuro consult pending -Keppra  for now  -will need neuro imaging at some point when resp status is more stable. MRI brain, and C spine   Acute resp failure w hypoxia Strep pneumo pna Suspected mucus plugging  -extubated 12/25 P -aggressive pulm hygiene -- adding IS, Flutter  -cont CPT, hypertonic  -HHFNC, wean as able for goal > 92 -PRN CXR   Sev sepsis 2/2 strep pneumo bacteremia, strep pneumo pna  -incr leukocytosis 12/27. P -f/u repeat viral panels, MRSA PCR  -cont rocephin    Hypernatremia Borderline hypomagnesemia  -starting d5w 50/hr x 12 hr -repeat BMP in afternoon, AM  -2g mag   Dysphagia  -NPO, supervised ice chips -refused NGT -SLP cont to see  Anemia  Thrombocytosis  -favor critical illness / reactive etiology + some acute dehydration P -AM CBC   Prediabetes w hyperglycemia  -off EN, off Steroids, starting to have more normoglycemia than hypergly reads  P -dc basal insulin  and change SSI to moderate from resistant  -planning to start d5 12/27 -- may need to escalate coverage from above changes, but with ?of sz feel best to not risk overcorrection and resultant hypoglycemia for now   Labs   CBC: Recent Labs  Lab 07/06/24 0245 07/07/24 0502 07/08/24 0449 07/09/24 0420 07/10/24 0430  WBC 22.4* 19.0* 12.1* 13.2* 16.6*  HGB 8.2* 8.0* 7.9* 8.7* 9.6*  HCT 25.6* 25.3* 24.3* 27.7* 30.5*  MCV 95.9 94.4 94.6 96.2 97.4  PLT 407* 425* 491* 663* 885*    Basic Metabolic Panel: Recent Labs  Lab  07/06/24 0245 07/06/24 1541 07/07/24 0502 07/07/24 1259 07/07/24 2003 07/08/24 0449 07/09/24 0420 07/10/24 0031 07/10/24 0430  NA 142   < > 141   < > 141 141 141 146* 149*  K 4.3   < > 3.8   < > 3.7 3.3* 3.8 3.6 3.6  CL 106   < > 104   < > 101 102 103 105 106  CO2 29   < > 28   < > 29 29 28 23 23   GLUCOSE 147*   < > 87   < > 169* 95 108* 112* 101*  BUN 22*   < > 23*   < > 22* 21* 22* 21* 23*  CREATININE 0.37*   < > 0.31*   < > 0.35* 0.32* 0.39* 0.41* 0.40*  CALCIUM  8.7*   < > 9.1   < > 8.8* 9.1 9.3 9.6 9.7  MG 1.8  --  1.9  --   --  1.7  --  1.7 1.7  PHOS 3.2  --  3.9  --   --  4.2 5.0*  --  3.7   < > = values in this interval not displayed.   GFR: Estimated Creatinine Clearance: 87.2  mL/min (A) (by C-G formula based on SCr of 0.4 mg/dL (L)). Recent Labs  Lab 07/07/24 0502 07/08/24 0449 07/09/24 0420 07/10/24 0430  WBC 19.0* 12.1* 13.2* 16.6*    Liver Function Tests: Recent Labs  Lab 07/06/24 0245 07/07/24 0502 07/08/24 0449 07/09/24 0420 07/10/24 0430  ALBUMIN 2.7* 2.7* 2.8* 3.3* 3.5   No results for input(s): LIPASE, AMYLASE in the last 168 hours. No results for input(s): AMMONIA in the last 168 hours.  ABG    Component Value Date/Time   PHART 7.51 (H) 07/09/2024 2205   PCO2ART 32 07/09/2024 2205   PO2ART 79 (L) 07/09/2024 2205   HCO3 25.5 07/09/2024 2205   ACIDBASEDEF 3.2 (H) 06/28/2024 0544   O2SAT 97.7 07/09/2024 2205     Coagulation Profile: No results for input(s): INR, PROTIME in the last 168 hours.  Cardiac Enzymes: No results for input(s): CKTOTAL, CKMB, CKMBINDEX, TROPONINI in the last 168 hours.  HbA1C: Hemoglobin A1C  Date/Time Value Ref Range Status  07/21/2012 04:48 AM 4.8 4.2 - 6.3 % Final    Comment:    The American Diabetes Association recommends that a primary goal of therapy should be <7% and that physicians should reevaluate the treatment regimen in patients with HbA1c values consistently >8%.    Hgb A1c  MFr Bld  Date/Time Value Ref Range Status  06/29/2024 05:04 AM 6.0 (H) 4.8 - 5.6 % Final    Comment:    (NOTE) Diagnosis of Diabetes The following HbA1c ranges recommended by the American Diabetes Association (ADA) may be used as an aid in the diagnosis of diabetes mellitus.  Hemoglobin             Suggested A1C NGSP%              Diagnosis  <5.7                   Non Diabetic  5.7-6.4                Pre-Diabetic  >6.4                   Diabetic  <7.0                   Glycemic control for                       adults with diabetes.      CBG: Recent Labs  Lab 07/09/24 1543 07/09/24 2000 07/09/24 2341 07/10/24 0416 07/10/24 0750  GLUCAP 96 102* 99 79 102*    CRITICAL CARE Performed by: Ronnald FORBES Gave   Total critical care time: 40 minutes  Critical care time was exclusive of separately billable procedures and treating other patients. Critical care was necessary to treat or prevent imminent or life-threatening deterioration.  Critical care was time spent personally by me on the following activities: development of treatment plan with patient and/or surrogate as well as nursing, discussions with consultants, evaluation of patient's response to treatment, examination of patient, obtaining history from patient or surrogate, ordering and performing treatments and interventions, ordering and review of laboratory studies, ordering and review of radiographic studies, pulse oximetry and re-evaluation of patient's condition.  Ronnald Gave MSN, AGACNP-BC [place] [facility] Amion for pager  07/10/2024, 9:27 AM      "

## 2024-07-10 NOTE — Progress Notes (Signed)
 Speech Language Pathology Treatment: Dysphagia  Patient Details Name: [name] Phillips MRN: [id number] DOB: [birth year] Today's Date: 07/10/2024 Time: 9054-8989 SLP Time Calculation (min) (ACUTE ONLY): 25 min  Assessment / Plan / Recommendation Clinical Impression  Pt seen for dysphagia intervention. Pt on high-flow O2 - 15L and ~38% FiO2. O2 saturations maintained at 94-98 for duration of session. Weak cough and hoarse vocal quality persists. Friend present in the room encouraging pt. Education shared with pt regarding rationale for ice chip limitation and need for caution for returning to PO. Ice chip trials completed with total assist x20. Noted wet vocal quality, intermittent throat clear/cough. Based on continued s/sx of aspiration and overt deconditioning, recommend continued NPO with consideration of NG placement for temporary nutrition. MD and RN aware. SLP will continue to monitor for need/timing for instrumental assessment.     HPI HPI: Pt is a 43 y/o female with h/o anxiety, HTN, MDD, HCV, hepatic steatosis, HLD, GERD, seizure disorder, Polysubstance abuse, recent use of fentanyl  and heroin 48 hours ago, who presents to the hospital with progressively worsening short of breath, hypoxia.  Patient states that she had upper respiratory infection about a week ago, had a high fever on the first day of infection.  Since then, she has progressively worsening shortness of breath w/ hypoxia.  She also has a cough with large amount of yellow mucus.   She admitted with bacterial PNA, septic shock, bactermia and AKI.  She was placed on HFNC O2 support at admit, then orally intubated d/t further pulmonary decline on 12/14.  She extubated on 12/25.   Chest imaging at admit: Patchy airspace disease and consolidation in the lungs  bilaterally. There is dense consolidation of the right upper and  lower lobes, concerning for pneumonia.  2. Trace to small right pleural effusion.  This has improve per recent  imaging.      SLP Plan  Continue with current plan of care        Swallow Evaluation Recommendations   Recommendations: Alternative means of nutrition - NG Tube;NPO Medication Administration: Via alternative means Oral care recommendations: Oral care before ice chips/water  Caregiver Recommendations: Remove water  pitcher;Have oral suction available     Recommendations                     Oral care QID;Oral care prior to ice chip/H20;Staff/trained caregiver to provide oral care   Frequent or constant Supervision/Assistance Dysphagia, oropharyngeal phase (R13.12) (suspect impact from lengthy illness/intubation/hospitalization; Polysubstance abuse/use; severely deconditioned and weak; Pulmonary decline)     Continue with current plan of care    Teralyn Mullins Clapp, MS, [name] Speech Language Pathologist Rehab Services; [facility] [phone] (ascom)   Abiha Lukehart J Clapp  07/10/2024, 10:12 AM

## 2024-07-10 NOTE — Progress Notes (Signed)
 eLink Physician-Brief Progress Note Patient Name: [name] Phillips DOB: 1980/09/28 MRN: [id number]   Date of Service  07/10/2024  HPI/Events of Note  Request for Maalox per tube to help with Heart burn  eICU Interventions  Previously ordered PO Maalox switched to per tube     Intervention Category Minor Interventions: Routine modifications to care plan (e.g. PRN medications for pain, fever)  [name] Phillips 07/10/2024, 10:39 PM

## 2024-07-10 NOTE — Consult Note (Addendum)
 NEUROLOGY CONSULT NOTE   Date of service: July 10, 2024 Patient Name: [name] Phillips MRN:  [id number] DOB:  1980/12/14 Chief Complaint / reason for consult: Concern for seizure Requesting Provider: Isadora Hose, MD  History of Present Illness  [name] Phillips is a 43 y.o. female who is being treated for strep pneumo pneumonia and bacteremia with past medical history significant for polysubstance abuse (IV drug use with fentanyl , also cocaine positive), hepatitis C, hyperlipidemia, hypertension, GERD, C-spine surgery (2019)  She initially presented on 06/27/2024.  Due to her hypoxia, work of breathing and tachycardia she was intubated and underwent bronchoscopy.  She remains severely hypoxic and had repeat bronchoscopy on 12/18, with attempts to wean sedation starting 12/21, she also required pressors due to septic shock from strep pneumo bacteremia and pneumonia and required heavy sedation.  Eventually with weaning sedation, excellent supportive care and diuresis she was able to be extubated on 12/25.    Antibiotic course: Initial treatment with azithromycin  (12/14, 12/16-19),  ceftriaxone  (12/14-12/17, broadened to cefepime  from 12/18 - 12/24), back to ceftriaxone  12/25-present linezolid  (12/14, 12/18 through 12/24),  pip-tazo on 12/14-15  Of note in the last 2 days she has worsening thrombocytosis and leukocytosis, although she continues to be afebrile she is being treated with ketorolac  for pain control  On 12/26 she had at least 3 episodes of brief behavioral arrest for less than a minute each time with confusion afterwards.  She was treated with 2 mg IV lorazepam , 1 gram levetiracetam  load, and 500 mg levetiracetam  bid with resolution of her spells. She attributes these spells to being excited about seeing her son whom she hadn't seen in a while and getting ice chips. Ceribell was ordered by overnight team.  She has had generalized weakness, which has been improving. Proximal >  distal pattern on my evaluation today.   Regarding seizure history she saw Dr. Maree in 2017 for a few spells which were attributed to her drinking. She stopped drinking, her dilantin  was tapered and she denies any other spells since then. She has had 4-5 episodes of head trauma with brief loss of conciousness but denies any other seizure risk factors.      ROS  Limited by acute illness, work of breathing, hypophonic responses to questions but she denies any other signficant recent symptoms or history  Past History   Past Medical History:  Diagnosis Date   Alcohol  abuse 11/02/2018   Hypertension    Seizure [facility])    sees dr maree at [facility], no longer has them. 9 years ago.    Past Surgical History:  Procedure Laterality Date   CARPAL TUNNEL RELEASE Right 04/11/2021   Procedure: CARPAL TUNNEL RELEASE ENDOSCOPIC;  Surgeon: Edie Norleen PARAS, MD;  Location: [facility] ORS;  Service: Orthopedics;  Laterality: Right;  Pateint requesting 1st case of the day   CERVICAL SPINE SURGERY  2019   CHOLECYSTECTOMY      Family History: Family History  Problem Relation Age of Onset   Alcohol  abuse Other    Hyperlipidemia Mother    Hypertension Mother    COPD Father    Heart disease Father    Hypertension Father    Breast cancer Maternal Aunt 79    Social History  reports that she quit smoking about 4 years ago. Her smoking use included cigarettes and e-cigarettes. She started smoking about 29 years ago. She has a 25 pack-year smoking history. She has never used smokeless tobacco. She reports that she does not currently  use alcohol . She reports that she does not currently use drugs.  Allergies[1]  Medications  Current Medications[2]  Vitals   Vitals:   07/10/24 0630 07/10/24 0700 07/10/24 0715 07/10/24 0744  BP: 137/68 134/75    Pulse: (!) 117 (!) 114    Resp: (!) 40 (!) 39    Temp:   98.4 F (36.9 C)   TempSrc:   Oral   SpO2: 96% 95%  96%  Height:        Body mass index is 27.79  kg/m.   Physical Exam   Constitutional: Appears ill and tired  Psych: Affect appropriate to situation, calm and cooperative Eyes: No scleral injection HENT: High flow oxygen in place. Uncomfortable to neck palpation but at her baseline chronic pain with no escalation of her pain MSK: no joint deformities.  Cardiovascular: Tachycardic, regular rhythm.  Perfusing extremities well Respiratory: Increased work of breathing GI: Soft.  No distension. There is no tenderness.  Skin: Warm dry and intact visible skin  Neurologic Examination   Mental Status: Patient is awake, alert, oriented to person, place, month, year, and situation. Patient is able to give a clear and coherent history. No signs of aphasia or neglect Cranial Nerves: II: Visual Fields are full. Pupils are equal, round, and reactive to light.   III,IV, VI: EOMI without ptosis or diploplia.  V: Facial sensation is symmetric to temperature VII: Facial movement is symmetric.  VIII: hearing is intact to voice X: Uvula difficult to visualize  XI: Shoulder shrug is symmetric. XII: tongue is midline without atrophy or fasciculations.  Motor: Low tone throughout. Bulk normal. 1/5 proximally (hip flexors, deltoids), 2/5 elbow extension/flexion, 3/5 wrist flexion extension, 3/5 foot plantar and dorisflexion Sensory: Sensation is symmetric to light touch and temperature in the arms and legs and intact compared to facial sensation. Vibration intact Deep Tendon Reflexes: 0 brachioradialis and 2+ patellae.  Plantars: Toes are downgoing bilaterally.  Cerebellar: Limited by weakness  Gait:  Deferred due to critical illness and weakness    Labs/Imaging/Neurodiagnostic studies    Basic Metabolic Panel: Recent Labs  Lab 07/06/24 0245 07/06/24 1541 07/07/24 0502 07/07/24 1259 07/07/24 2003 07/08/24 0449 07/09/24 0420 07/10/24 0031 07/10/24 0430  NA 142   < > 141   < > 141 141 141 146* 149*  K 4.3   < > 3.8   < > 3.7  3.3* 3.8 3.6 3.6  CL 106   < > 104   < > 101 102 103 105 106  CO2 29   < > 28   < > 29 29 28 23 23   GLUCOSE 147*   < > 87   < > 169* 95 108* 112* 101*  BUN 22*   < > 23*   < > 22* 21* 22* 21* 23*  CREATININE 0.37*   < > 0.31*   < > 0.35* 0.32* 0.39* 0.41* 0.40*  CALCIUM  8.7*   < > 9.1   < > 8.8* 9.1 9.3 9.6 9.7  MG 1.8  --  1.9  --   --  1.7  --  1.7 1.7  PHOS 3.2  --  3.9  --   --  4.2 5.0*  --  3.7   < > = values in this interval not displayed.    CBC: Recent Labs  Lab 07/06/24 0245 07/07/24 0502 07/08/24 0449 07/09/24 0420 07/10/24 0430  WBC 22.4* 19.0* 12.1* 13.2* 16.6*  HGB 8.2* 8.0* 7.9* 8.7* 9.6*  HCT 25.6*  25.3* 24.3* 27.7* 30.5*  MCV 95.9 94.4 94.6 96.2 97.4  PLT 407* 425* 491* 663* 885*    Coagulation Studies: No results for input(s): LABPROT, INR in the last 72 hours.    Lipid Panel:  Lab Results  Component Value Date   LDLCALC 60 10/29/2023   HgbA1c:  Lab Results  Component Value Date   HGBA1C 6.0 (H) 06/29/2024   Urine Drug Screen:     Component Value Date/Time   LABOPIA NEGATIVE 06/27/2024 2117   COCAINSCRNUR POSITIVE (A) 06/27/2024 2117   COCAINSCRNUR NONE DETECTED 11/01/2018 1955   LABBENZ NEGATIVE 06/27/2024 2117   AMPHETMU NEGATIVE 06/27/2024 2117   THCU NEGATIVE 06/27/2024 2117   LABBARB NEGATIVE 06/27/2024 2117    Alcohol  Level     Component Value Date/Time   ETH 316 (HH) 11/01/2018 1954   INR No results found for: INR APTT No results found for: APTT AED levels:  Lab Results  Component Value Date   PHENYTOIN  <2.5 (L) 06/02/2014   Ceribell EEG 07/10/2024 0608 to 0930  - Excessive beta, generalized  ASSESSMENT   MAKYRA CORPREW is a 43 y.o. female for whom neurology is consulted for a few spells of altered awareness. Differential includes partial seizure with altered awareness, delirium, or non-epileptic event. I think S. Pneumo meningitis is less likely given she has overall been improving clinically and mental status  is quite excellent with no cranial nerve findings. However, MRI brain w/ and w/o is a prudent part of seizure workup to pursue. 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 She exhibits good insight that she had had complications from substance use and is motivated to quit.   RECOMMENDATIONS  - Continue Keppra  500 mg BID, may consider tapering again outpatient - Seizure precautions will need to be discussed with patient prior to discharge, detailed below) - Routine EEG when able (Monday); discontinue ceribell EEG (but this can be reapplied within 24 hours if clinical status deteriorates, please consult neurology prior to reapplication) - MRI brain and MRI C-spine w and w/o contrast to rule out CNS infection from her bacteremia  - To be ordered once respiratory status is stabilized  - Appreciate excellent care by ICU team, discussed with them via secure chat and in person   Standard seizure precautions: Per [place]  DMV statutes, patients with seizures are not allowed to drive until  they have been seizure-free for six months. Use caution when using heavy equipment or power tools. Avoid working on ladders or at heights. Take showers instead of baths. Ensure the water  temperature is not too high on the home water  heater. Do not go swimming alone. When caring for infants or small children, sit down when holding, feeding, or changing them to minimize risk of injury to the child in the event you have a seizure.  To reduce risk of seizures, maintain good sleep hygiene avoid alcohol  and illicit drug use, take all anti-seizure medications as prescribed.    ______________________________________________________________________  Lola Jernigan MD-PhD [facility] [phone] CRITICAL CARE Performed by: Lola LITTIE Jernigan   Total critical care time: 45 minutes  Critical care time was exclusive of separately billable procedures and treating other patients.  Critical care was necessary to treat or prevent imminent or life-threatening deterioration.  Critical care was time spent personally by me on the  following activities: development of treatment plan with patient and/or surrogate as well as nursing, discussions with consultants, evaluation of patient's response to treatment, examination of patient, obtaining history from patient or surrogate, ordering and performing treatments and interventions, ordering and review of laboratory studies, ordering and review of radiographic studies, pulse oximetry and re-evaluation of patient's condition.      [1]  Allergies Allergen Reactions   Varenicline  Tartrate Other (See Comments)    varenicline    Wellbutrin  [Bupropion ]     Suicidal thoughts   [2]  Current Facility-Administered Medications:    acetaminophen  (TYLENOL ) tablet 650 mg, 650 mg, Oral, Q6H PRN, Chappell, Alex B, RPH   artificial tears ophthalmic solution 1 drop, 1 drop, Both Eyes, PRN, Isadora, Khabib, MD, 1 drop at 07/08/24 0150   atorvastatin  (LIPITOR) tablet 10 mg, 10 mg, Oral, Daily, Chappell, Alex B, RPH   cefTRIAXone  (ROCEPHIN ) 2 g in sodium chloride  0.9 % 100 mL IVPB, 2 g, Intravenous, Q24H, Dgayli, Belva, MD, Stopped at 07/09/24 1003   Chlorhexidine  Gluconate Cloth 2 % PADS 6 each, 6 each, Topical, Q0600, Zhang, Dekui, MD, 6 each at 07/10/24 0100   cholecalciferol  (VITAMIN D3) 25 MCG (1000 UNIT) tablet 2,000 Units, 2,000 Units, Oral, Daily, Chappell, Alex B, RPH   cloNIDine  (CATAPRES ) tablet 0.1 mg, 0.1 mg, Oral, BID, Keene, Jeremiah D, NP   enoxaparin  (LOVENOX ) injection 40 mg, 40 mg, Subcutaneous, Daily, Dgayli, Khabib, MD, 40 mg at 07/09/24 9065   folic acid  (FOLVITE ) tablet 1 mg, 1 mg, Oral, Daily, Chappell, Alex B, RPH   hydrALAZINE  (APRESOLINE ) injection 10-20 mg, 10-20 mg,  Intravenous, Q4H PRN, Rust-Chester, Jenita L, NP, 20 mg at 07/03/24 0241   HYDROmorphone  (DILAUDID ) injection 0.5-1 mg, 0.5-1 mg, Intravenous, Q3H PRN, Clair Marolyn NOVAK, RPH, 1 mg at 07/10/24 9344   HYDROmorphone  (DILAUDID ) injection 1 mg, 1 mg, Intravenous, Once, Shellia Mann D, NP   hydrOXYzine  (ATARAX ) tablet 25 mg, 25 mg, Oral, TID PRN, Chappell, Alex B, RPH   insulin  aspart (novoLOG ) injection 0-20 Units, 0-20 Units, Subcutaneous, Q4H, Rust-Chester, Jenita CROME, NP, 4 Units at 07/08/24 1555   insulin  glargine (LANTUS ) injection 10 Units, 10 Units, Subcutaneous, Daily, Ouma, Elizabeth Achieng, NP, 10 Units at 07/09/24 9070   ipratropium-albuterol  (DUONEB) 0.5-2.5 (3) MG/3ML nebulizer solution 3 mL, 3 mL, Nebulization, TID, Dgayli, Khabib, MD, 3 mL at 07/10/24 0744   ketorolac  (TORADOL ) 15 MG/ML injection 15 mg, 15 mg, Intravenous, Q8H PRN, Bowser, Grace E, NP, 15 mg at 07/10/24 0053   levETIRAcetam  (KEPPRA ) undiluted injection 500 mg, 500 mg, Intravenous, Q12H, Dgayli, Khabib, MD, 500 mg at 07/09/24 2211   lidocaine  (LIDODERM ) 5 % 1 patch, 1 patch, Transdermal, Q24H, Bowser, Ronnald BRAVO, NP, 1 patch at 07/09/24 1708   magnesium  sulfate IVPB 1 g 100 mL, 1 g, Intravenous, Once, Sharilyn Estill HERO, RPH   Oral care mouth rinse, 15 mL, Mouth Rinse, Q2H, Dgayli, Khabib, MD, 15 mL at 07/10/24 0536   oxyCODONE  (Oxy IR/ROXICODONE ) immediate release tablet 30 mg, 30 mg, Oral, Q6H, Chappell, Alex B, RPH   sertraline  (ZOLOFT ) tablet 100 mg, 100 mg, Oral, QHS, Clair Marolyn NOVAK, [facility]   sodium chloride  flush (NS) 0.9 % injection 10-40 mL, 10-40 mL, Intracatheter, Q12H, Dgayli, Khabib, MD, 20 mL at 07/09/24 2210   sodium chloride  flush (NS) 0.9 % injection 10-40 mL, 10-40 mL, Intracatheter, PRN, Isadora, Khabib, MD   sodium chloride  HYPERTONIC 3 % nebulizer solution 4 mL, 4 mL, Nebulization, BID, Dgayli, Khabib, MD, 4 mL at 07/10/24  0744   thiamine  (VITAMIN B1) tablet 100 mg, 100 mg, Oral, Daily, Chappell, Alex B,  [facility]

## 2024-07-11 DIAGNOSIS — T4275XA Adverse effect of unspecified antiepileptic and sedative-hypnotic drugs, initial encounter: Secondary | ICD-10-CM

## 2024-07-11 DIAGNOSIS — R531 Weakness: Secondary | ICD-10-CM | POA: Diagnosis not present

## 2024-07-11 DIAGNOSIS — G4089 Other seizures: Secondary | ICD-10-CM | POA: Diagnosis not present

## 2024-07-11 DIAGNOSIS — J9601 Acute respiratory failure with hypoxia: Secondary | ICD-10-CM | POA: Diagnosis not present

## 2024-07-11 LAB — RENAL FUNCTION PANEL
Albumin: 3.5 g/dL (ref 3.5–5.0)
Anion gap: 10 (ref 5–15)
BUN: 19 mg/dL (ref 6–20)
CO2: 27 mmol/L (ref 22–32)
Calcium: 9.8 mg/dL (ref 8.9–10.3)
Chloride: 110 mmol/L (ref 98–111)
Creatinine, Ser: 0.37 mg/dL — ABNORMAL LOW (ref 0.44–1.00)
GFR, Estimated: >60 mL/min
Glucose, Bld: 129 mg/dL — ABNORMAL HIGH (ref 70–99)
Phosphorus: 3.8 mg/dL (ref 2.5–4.6)
Potassium: 3.4 mmol/L — ABNORMAL LOW (ref 3.5–5.1)
Sodium: 147 mmol/L — ABNORMAL HIGH (ref 135–145)

## 2024-07-11 LAB — BASIC METABOLIC PANEL WITH GFR
Anion gap: 11 (ref 5–15)
BUN: 17 mg/dL (ref 6–20)
CO2: 26 mmol/L (ref 22–32)
Calcium: 9.7 mg/dL (ref 8.9–10.3)
Chloride: 109 mmol/L (ref 98–111)
Creatinine, Ser: 0.4 mg/dL — ABNORMAL LOW (ref 0.44–1.00)
GFR, Estimated: >60 mL/min
Glucose, Bld: 132 mg/dL — ABNORMAL HIGH (ref 70–99)
Potassium: 3.7 mmol/L (ref 3.5–5.1)
Sodium: 146 mmol/L — ABNORMAL HIGH (ref 135–145)

## 2024-07-11 LAB — GLUCOSE, CAPILLARY
Glucose-Capillary: 107 mg/dL — ABNORMAL HIGH (ref 70–99)
Glucose-Capillary: 108 mg/dL — ABNORMAL HIGH (ref 70–99)
Glucose-Capillary: 133 mg/dL — ABNORMAL HIGH (ref 70–99)
Glucose-Capillary: 144 mg/dL — ABNORMAL HIGH (ref 70–99)
Glucose-Capillary: 146 mg/dL — ABNORMAL HIGH (ref 70–99)
Glucose-Capillary: 148 mg/dL — ABNORMAL HIGH (ref 70–99)

## 2024-07-11 LAB — CBC
HCT: 29.5 % — ABNORMAL LOW (ref 36.0–46.0)
Hemoglobin: 9.2 g/dL — ABNORMAL LOW (ref 12.0–15.0)
MCH: 30.3 pg (ref 26.0–34.0)
MCHC: 31.2 g/dL (ref 30.0–36.0)
MCV: 97 fL (ref 80.0–100.0)
Platelets: 776 K/uL — ABNORMAL HIGH (ref 150–400)
RBC: 3.04 MIL/uL — ABNORMAL LOW (ref 3.87–5.11)
RDW: 15.7 % — ABNORMAL HIGH (ref 11.5–15.5)
WBC: 13.9 K/uL — ABNORMAL HIGH (ref 4.0–10.5)
nRBC: 0 % (ref 0.0–0.2)

## 2024-07-11 LAB — MAGNESIUM: Magnesium: 1.9 mg/dL (ref 1.7–2.4)

## 2024-07-11 MED ORDER — POTASSIUM CHLORIDE 20 MEQ PO PACK
40.0000 meq | PACK | Freq: Once | ORAL | Status: AC
  Administered 2024-07-11: 40 meq
  Filled 2024-07-11: qty 2

## 2024-07-11 MED ORDER — LAMOTRIGINE 25 MG PO TABS
25.0000 mg | ORAL_TABLET | Freq: Every day | ORAL | Status: DC
  Administered 2024-07-11 – 2024-07-14 (×4): 25 mg via ORAL
  Filled 2024-07-11: qty 1

## 2024-07-11 MED ORDER — MAGNESIUM SULFATE 2 GM/50ML IV SOLN
2.0000 g | Freq: Once | INTRAVENOUS | Status: AC
  Administered 2024-07-11: 2 g via INTRAVENOUS
  Filled 2024-07-11: qty 50

## 2024-07-11 MED ORDER — PYRIDOXINE HCL 25 MG PO TABS
25.0000 mg | ORAL_TABLET | Freq: Every day | ORAL | Status: DC
  Administered 2024-07-11 – 2024-07-15 (×5): 25 mg via ORAL
  Filled 2024-07-11: qty 1

## 2024-07-11 MED ORDER — PYRIDOXINE HCL 100 MG/ML IJ SOLN: 50.0000 mg | Freq: Every day | INTRAMUSCULAR | Status: DC

## 2024-07-11 MED ORDER — ALUM & MAG HYDROXIDE-SIMETH 200-200-20 MG/5ML PO SUSP
30.0000 mL | ORAL | Status: AC | PRN
  Administered 2024-07-11 – 2024-07-12 (×3): 30 mL
  Filled 2024-07-11 (×2): qty 30

## 2024-07-11 MED ORDER — LAMOTRIGINE 25 MG PO TABS: 25.0000 mg | ORAL_TABLET | Freq: Two times a day (BID) | ORAL | Status: DC

## 2024-07-11 NOTE — Hospital Course (Signed)
 Hospital course / significant events:   HPI: 43 year old female with history of polysubstance use (IVDU with fentanyl , last injection 4 days ago) who presents with increased shortness of breath and admitted for management of pneumonia. She reports symptom onset around over a week ago with fevers and chills. This improved with symptomatic management, but she then developed increased shortness of breath and cough over the past couple of days prompting presentation to the ED. She reports a cough productive of yellow sputum. She has not had any sick contacts, nor has she been sick like this in the past. Patient reports recent travel in November to [place], [place] but denies any hikes or venturing into the desert. She reports recently staying at a hotel here in [place] . She reports a history of IVDU, having used IV fentanyl  4 days ago, and also reports snorting narcotics and vape use. She gets her needles from an exchange program where she also procures the water  used for injection. Denies any history of endocarditis.  12/14: admit with right sided pneumonia and respiratory failure. CXR with right lung white out, INTUBATED, S/p BRONCH, ART LINE PLACED 12/14: BLOOD CX +STREP PNEUMONIA 12/15: remains on vent 12/16: remains on vent 12/17: remains on vent, severe hypoxia, CVL placed 12/18: severe Hypoxia s/p BRONCH mucoid secretions, worsening CXR, ABX broadened to cover Pseudomonas and MRSA 12/19: severe hypoxia 12/21: attempt sedation wean, diurese 12/22: Remains critically ill requiring mechanical ventilation, levophed  and multiple sedation agents. Diuresed with Lasix  40mg  yesterday; 5.9L UO. Will attempt at [facility] today and wean vent requirements if able 12/23: Remains critically ill requiring mechanical ventilation. Still on levophed , propofol , dilaudid  and precedex . O2 requirements back to 60%; went up to 100% yesterday d/t ETT progressing into right mainstem and needed retraction. Required versed  early  this am for acute agitation when trying to get a bath. Wean vent settings as able. Will hold of on weaning sedation today as she is awake on current sedation requirements 12/24: Remains critically ill requiring mechanical ventilation. Low pressor requirements and decreasing sedation needs. Wean vent settings as able. Awake and able to answer questions appropriately. ENT following for possible trach placement next week. 12/25: Extubated to [facility]NC, requiring Precedex  for anxiety/delirium. Diuresed w 4.3L UOP  12/26: Tolerating extubation, wean HHFNC as able.  Wean precedex , clonidine  started  12/27 remains in ICU on ceribell  12/28: improving and transfer to [facility] hospitalist 12/29: SLP following, still oropharyngeal dysphagia, NG still in place and patient n.p.o. otherwise.  Evaluation in process for inpatient rehab.     Consultants:  PCCU Neurology ENT   Procedures/Surgeries:        ASSESSMENT & PLAN:   Respiratory failure secondary to severe pneumonia from S. Pneumo. - resolved Extubated 12/25 to hi-flo --> tapered PT/OT mobilize secretions (hypertonic saline, vest, flutter device).  Swallowing dysfunction - resolved NG out doing well w/ diet po   Septic shock secondary to bacteremia  now resolved.  off vasopressors, abx Monitor   AKI on presentation, improved.  Monitor BMP  history of polysubstance and IV drug use suboxone  prior to presentation.  All sedation and drips are discontinued after extubation.  IV hydromorphone  PRN until we establish PO   Seizure history Appreciate neurology recs - see note 12/28 Continue Keppra  500 mg po BID, may consider tapering again outpatient after week 8 - defer to outpatient  Lamotrigine  SLOW up-titration as below    Morning  Night  Week 1 and 2   none   25  mg   Week 3 and 4   25 mg    25 mg  Week 5   25 mg   50 mg  Week 6   50 mg   75 mg  Week 7   75 mg 100 mg  Week 8  100 mg 125 mg  add B6 50 mg daily to ameliorate  behavioral side effects NO DRIVING Seizure precautions   Prediabetes w hyperglycemia  off TPN and Steroids, starting to have more normoglycemia than hypergly reads  dc basal insulin  and change SSI to moderate from resistant   Urinary retention Foley in place, DC tomorrow for void trial, order is in to remove Foley 07/14/2024 at 6 AM  Debility Await rehab   overweight based on BMI: Body mass index is 26.02 kg/m.SABRA Significantly low or high BMI is associated with higher medical risk.  Underweight - under 18  overweight - 25 to 29 obese - 30 or more Class 1 obesity: BMI of 30.0 to 34 Class 2 obesity: BMI of 35.0 to 39 Class 3 obesity: BMI of 40.0 to 49 Super Morbid Obesity: BMI 50-59 Super-super Morbid Obesity: BMI 60+ Healthy nutrition and physical activity advised as adjunct to other disease management and risk reduction treatments    DVT prophylaxis: lovenox  IV fluids: no continuous IV fluids  Nutrition: diet per SLP Central lines / other devices: none Code Status: FULL CODE ACP documentation reviewed:  none on file in [facility]  [facility] needs: inpatient rehab pending Medical barriers to dispo: none now that NG out and toelrating diet

## 2024-07-11 NOTE — Progress Notes (Signed)
 " PROGRESS NOTE    [name] Phillips   FMW:[id number] DOB: 1981-04-15  DOA: 06/27/2024 Date of Service: 07/11/2024 which is hospital day 14  PCP: Corwin Antu, [facility] course / significant events:   HPI: 43 year old female with history of polysubstance use (IVDU with fentanyl , last injection 4 days ago) who presents with increased shortness of breath and admitted for management of pneumonia. She reports symptom onset around over a week ago with fevers and chills. This improved with symptomatic management, but she then developed increased shortness of breath and cough over the past couple of days prompting presentation to the ED. She reports a cough productive of yellow sputum. She has not had any sick contacts, nor has she been sick like this in the past. Patient reports recent travel in November to [place], [place] but denies any hikes or venturing into the desert. She reports recently staying at a hotel here in [place] . She reports a history of IVDU, having used IV fentanyl  4 days ago, and also reports snorting narcotics and vape use. She gets her needles from an exchange program where she also procures the water  used for injection. Denies any history of endocarditis.  12/14: admit with right sided pneumonia and respiratory failure. CXR with right lung white out, INTUBATED, S/p BRONCH, ART LINE PLACED 12/14: BLOOD CX +STREP PNEUMONIA 12/15: remains on vent 12/16: remains on vent 12/17: remains on vent, severe hypoxia, CVL placed 12/18: severe Hypoxia s/p BRONCH mucoid secretions, worsening CXR, ABX broadened to cover Pseudomonas and MRSA 12/19: severe hypoxia 12/21: attempt sedation wean, diurese 12/22: Remains critically ill requiring mechanical ventilation, levophed  and multiple sedation agents. Diuresed with Lasix  40mg  yesterday; 5.9L UO. Will attempt at [facility] today and wean vent requirements if able 12/23: Remains critically ill requiring mechanical ventilation. Still on  levophed , propofol , dilaudid  and precedex . O2 requirements back to 60%; went up to 100% yesterday d/t ETT progressing into right mainstem and needed retraction. Required versed  early this am for acute agitation when trying to get a bath. Wean vent settings as able. Will hold of on weaning sedation today as she is awake on current sedation requirements 12/24: Remains critically ill requiring mechanical ventilation. Low pressor requirements and decreasing sedation needs. Wean vent settings as able. Awake and able to answer questions appropriately. ENT following for possible trach placement next week. 12/25: Extubated to HHFNC, requiring Precedex  for anxiety/delirium. Diuresed w 4.3L UOP  12/26: Tolerating extubation, wean HHFNC as able.  Wean precedex , clonidine  started  12/27 remains in ICU on ceribell  12/28: improving and transfer to [facility] hospitalist     Consultants:  [facility] ENT   Procedures/Surgeries:        ASSESSMENT & PLAN:   Respiratory failure secondary to severe pneumonia from S. Pneumo.  Extubated 12/25 to hi-flo --> tapered PT/OT mobilize secretions (hypertonic saline, vest, flutter device).  Swallowing dysfunction NG in place SLP working on diet  Septic shock secondary to bacteremia  now resolved.  off vasopressors, abx Monitor   AKI on presentation, improved.  Monor BMP  history of polysubstance and IV drug use suboxone  prior to presentation.  All sedation and drips are discontinued after extubation.  Now on IV hydromorphone  PRN until we establish PO   Seizure history Appreciate neurology recs - see note 12/28 Continue Keppra  500 mg BID, may consider tapering again outpatient Add B6 50 mg daily to ameliorate behavioral side effects Start lamotrigine  with plan to taper keppra  after week 8,  taper to be determined in outpatient follow-up NO DRIVING Seizure precautions   Prediabetes w hyperglycemia  -off EN, off Steroids, starting to have more  normoglycemia than hypergly reads  dc basal insulin  and change SSI to moderate from resistant     overweight based on BMI: Body mass index is 27.79 kg/m.SABRA Significantly low or high BMI is associated with higher medical risk.  Underweight - under 18  overweight - 25 to 29 obese - 30 or more Class 1 obesity: BMI of 30.0 to 34 Class 2 obesity: BMI of 35.0 to 39 Class 3 obesity: BMI of 40.0 to 49 Super Morbid Obesity: BMI 50-59 Super-super Morbid Obesity: BMI 60+ Healthy nutrition and physical activity advised as adjunct to other disease management and risk reduction treatments    DVT prophylaxis: lovenox  IV fluids: no continuous IV fluids  Nutrition: NPO ice chips, tube feeds NG Central lines / other devices: NG, Foley  Code Status: FULL CODE ACP documentation reviewed:  none on file in [facility]  TOC needs: TBD Medical barriers to dispo: NG in place, hypernatremia, NPO. Expected medical readiness for discharge few days.              Subjective / Brief ROS:  Patient reports feeling better today  Denies CP/SOB.  Pain controlled.  Denies new weakness. .  Reports no concerns w/ defecation.   Family Communication: family at bedside on rounds    Objective Findings:  Vitals:   07/11/24 1400 07/11/24 1500 07/11/24 1600 07/11/24 1601  BP: 131/82 (!) 145/85 (!) 149/79 (!) 149/79  Pulse:  81 (!) 103 (!) 102  Resp: 19 (!) 21 (!) 21 (!) 21  Temp:   97.9 F (36.6 C)   TempSrc:   Oral   SpO2: 95% 94% 97% 96%  Height:        Intake/Output Summary (Last 24 hours) at 07/11/2024 1756 Last data filed at 07/11/2024 1500 Gross per 24 hour  Intake 844.51 ml  Output 1625 ml  Net -780.49 ml   Filed Weights    Examination:  Physical Exam Constitutional:      General: She is not in acute distress. Cardiovascular:     Rate and Rhythm: Regular rhythm. Tachycardia present.  Pulmonary:     Effort: Pulmonary effort is normal.     Breath sounds: No decreased breath sounds  or wheezing.  Musculoskeletal:     Right lower leg: No edema.     Left lower leg: No edema.  Skin:    General: Skin is warm and dry.  Neurological:     Mental Status: She is alert and oriented to person, place, and time.  Psychiatric:        Mood and Affect: Mood normal.        Behavior: Behavior normal.          Scheduled Medications:   atorvastatin   10 mg Per NG tube Daily   Chlorhexidine  Gluconate Cloth  6 each Topical Q0600   cholecalciferol   2,000 Units Per NG tube Daily   enoxaparin  (LOVENOX ) injection  40 mg Subcutaneous Daily   feeding supplement (PROSource TF20)  60 mL Per Tube Daily   folic acid   1 mg Per Tube Daily   free water   30 mL Per Tube Q4H    HYDROmorphone  (DILAUDID ) injection  1 mg Intravenous Once   insulin  aspart  0-15 Units Subcutaneous Q4H   ipratropium-albuterol   3 mL Nebulization TID   lamoTRIgine   25 mg Oral QHS   Followed  by   [START ON 07/25/2024] lamoTRIgine   25 mg Oral BID   levETIRAcetam   500 mg Intravenous Q12H   lidocaine   1 patch Transdermal Q24H   mouth rinse  15 mL Mouth Rinse Q2H   oxyCODONE   30 mg Per NG tube Q6H   pyridOXINE   25 mg Oral Daily   sertraline   100 mg Per NG tube QHS   sodium chloride  flush  10-40 mL Intracatheter Q12H   sodium chloride  HYPERTONIC  4 mL Nebulization BID   thiamine   100 mg Per NG tube Daily    Continuous Infusions:  feeding supplement (OSMOLITE 1.5 CAL) 40 mL/hr at 07/11/24 1500    PRN Medications:  acetaminophen , alum & mag hydroxide-simeth, artificial tears, hydrALAZINE , HYDROmorphone  (DILAUDID ) injection, hydrOXYzine , sodium chloride  flush  Antimicrobials from admission:  Anti-infectives (From admission, onward)    Start     Dose/Rate Route Frequency Ordered Stop   07/08/24 1000  cefTRIAXone  (ROCEPHIN ) 2 g in sodium chloride  0.9 % 100 mL IVPB        2 g 200 mL/hr over 30 Minutes Intravenous Every 24 hours 07/05/24 1128 07/10/24 0946   07/01/24 1600  ceFEPIme  (MAXIPIME ) 2 g in sodium  chloride 0.9 % 100 mL IVPB        2 g 200 mL/hr over 30 Minutes Intravenous Every 8 hours 07/01/24 1429 07/07/24 1839   07/01/24 1515  linezolid  (ZYVOX ) IVPB 600 mg        600 mg 300 mL/hr over 60 Minutes Intravenous Every 12 hours 07/01/24 1423 07/07/24 2230   06/28/24 1600  cefTRIAXone  (ROCEPHIN ) 2 g in sodium chloride  0.9 % 100 mL IVPB  Status:  Discontinued        2 g 200 mL/hr over 30 Minutes Intravenous Every 24 hours 06/28/24 1104 07/01/24 1423   06/28/24 0600  linezolid  (ZYVOX ) IVPB 600 mg  Status:  Discontinued        600 mg 300 mL/hr over 60 Minutes Intravenous Every 12 hours 06/27/24 2117 06/28/24 1102   06/28/24 0200  piperacillin -tazobactam (ZOSYN ) IVPB 3.375 g  Status:  Discontinued        3.375 g 12.5 mL/hr over 240 Minutes Intravenous Every 8 hours 06/27/24 2322 06/28/24 1104   06/27/24 1500  azithromycin  (ZITHROMAX ) 500 mg in sodium chloride  0.9 % 250 mL IVPB        500 mg 250 mL/hr over 60 Minutes Intravenous Every 24 hours 06/27/24 1403 07/02/24 2359   06/27/24 1415  linezolid  (ZYVOX ) IVPB 600 mg  Status:  Discontinued        600 mg 300 mL/hr over 60 Minutes Intravenous Every 12 hours 06/27/24 1403 06/27/24 2117   06/27/24 1400  piperacillin -tazobactam (ZOSYN ) IVPB 3.375 g  Status:  Discontinued        3.375 g 12.5 mL/hr over 240 Minutes Intravenous Every 8 hours 06/27/24 1321 06/27/24 2322   06/27/24 1115  azithromycin  (ZITHROMAX ) 500 mg in sodium chloride  0.9 % 250 mL IVPB  Status:  Discontinued        500 mg 250 mL/hr over 60 Minutes Intravenous  Once 06/27/24 1102 06/27/24 1408   06/27/24 1115  cefTRIAXone  (ROCEPHIN ) 1 g in sodium chloride  0.9 % 100 mL IVPB        1 g 200 mL/hr over 30 Minutes Intravenous  Once 06/27/24 1102 06/27/24 1200           Data Reviewed:  I have personally reviewed the following...  CBC: Recent Labs  Lab 07/07/24 0502 07/08/24  9550 07/09/24 0420 07/10/24 0430 07/11/24 0331  WBC 19.0* 12.1* 13.2* 16.6* 13.9*  HGB 8.0*  7.9* 8.7* 9.6* 9.2*  HCT 25.3* 24.3* 27.7* 30.5* 29.5*  MCV 94.4 94.6 96.2 97.4 97.0  PLT 425* 491* 663* 885* 776*   Basic Metabolic Panel: Recent Labs  Lab 07/07/24 0502 07/07/24 1259 07/08/24 0449 07/09/24 0420 07/10/24 0031 07/10/24 0430 07/10/24 1652 07/11/24 0331  NA 141   < > 141 141 146* 149* 138 147*  K 3.8   < > 3.3* 3.8 3.6 3.6 3.3* 3.4*  CL 104   < > 102 103 105 106 101 110  CO2 28   < > 29 28 23 23  20* 27  GLUCOSE 87   < > 95 108* 112* 101* 373* 129*  BUN 23*   < > 21* 22* 21* 23* 20 19  CREATININE 0.31*   < > 0.32* 0.39* 0.41* 0.40* 0.36* 0.37*  CALCIUM  9.1   < > 9.1 9.3 9.6 9.7 8.7* 9.8  MG 1.9  --  1.7  --  1.7 1.7  --  1.9  PHOS 3.9  --  4.2 5.0*  --  3.7  --  3.8   < > = values in this interval not displayed.   GFR: Estimated Creatinine Clearance: 87.2 mL/min (A) (by C-G formula based on SCr of 0.37 mg/dL (L)). Liver Function Tests: Recent Labs  Lab 07/07/24 0502 07/08/24 0449 07/09/24 0420 07/10/24 0430 07/11/24 0331  ALBUMIN 2.7* 2.8* 3.3* 3.5 3.5   No results for input(s): LIPASE, AMYLASE in the last 168 hours. No results for input(s): AMMONIA in the last 168 hours. Coagulation Profile: No results for input(s): INR, PROTIME in the last 168 hours. Cardiac Enzymes: No results for input(s): CKTOTAL, CKMB, CKMBINDEX, TROPONINI in the last 168 hours. BNP (last 3 results) No results for input(s): PROBNP in the last 8760 hours. HbA1C: No results for input(s): HGBA1C in the last 72 hours. CBG: Recent Labs  Lab 07/11/24 0020 07/11/24 0311 07/11/24 0752 07/11/24 1134 07/11/24 1554  GLUCAP 107* 108* 133* 144* 148*   Lipid Profile: Recent Labs    07/10/24 0430  TRIG 149   Thyroid  Function Tests: No results for input(s): TSH, T4TOTAL, FREET4, T3FREE, THYROIDAB in the last 72 hours. Anemia Panel: No results for input(s): VITAMINB12, FOLATE, FERRITIN, TIBC, IRON, RETICCTPCT in the last 72  hours. Most Recent Urinalysis On File:     Component Value Date/Time   COLORURINE YELLOW (A) 06/27/2024 2117   APPEARANCEUR HAZY (A) 06/27/2024 2117   APPEARANCEUR Hazy 10/05/2013 0808   LABSPEC 1.013 06/27/2024 2117   LABSPEC 1.006 10/05/2013 0808   PHURINE 5.0 06/27/2024 2117   GLUCOSEU NEGATIVE 06/27/2024 2117   GLUCOSEU Negative 10/05/2013 0808   HGBUR NEGATIVE 06/27/2024 2117   BILIRUBINUR NEGATIVE 06/27/2024 2117   BILIRUBINUR neg 05/17/2014 1208   BILIRUBINUR Negative 10/05/2013 0808   KETONESUR NEGATIVE 06/27/2024 2117   PROTEINUR 30 (A) 06/27/2024 2117   UROBILINOGEN 0.2 05/17/2014 1208   NITRITE NEGATIVE 06/27/2024 2117   LEUKOCYTESUR NEGATIVE 06/27/2024 2117   LEUKOCYTESUR Negative 10/05/2013 0808   Sepsis Labs: @LABRCNTIP (procalcitonin:4,lacticidven:4) Microbiology: Recent Results (from the past 240 hours)  MRSA Next Gen by PCR, Nasal     Status: None   Collection Time: 07/10/24  9:33 AM   Specimen: Anterior Nasal Swab  Result Value Ref Range Status   MRSA by PCR Next Gen NOT DETECTED NOT DETECTED Final    Comment: (NOTE) The GeneXpert MRSA Assay (FDA approved for  NASAL specimens only), is one component of a comprehensive MRSA colonization surveillance program. It is not intended to diagnose MRSA infection nor to guide or monitor treatment for MRSA infections. Test performance is not FDA approved in patients less than 37 years old. Performed at [facility], [address] [place], [place] [address]   Resp panel by RT-PCR (RSV, Flu A&B, Covid) Anterior Nasal Swab     Status: None   Collection Time: 07/10/24  9:34 AM   Specimen: Anterior Nasal Swab  Result Value Ref Range Status   SARS Coronavirus 2 by RT PCR NEGATIVE NEGATIVE Final    Comment: (NOTE) SARS-CoV-2 target nucleic acids are NOT DETECTED.  The SARS-CoV-2 RNA is generally detectable in upper respiratory specimens during the acute phase of infection. The lowest concentration of  SARS-CoV-2 viral copies this assay can detect is 138 copies/mL. A negative result does not preclude SARS-Cov-2 infection and should not be used as the sole basis for treatment or other patient management decisions. A negative result may occur with  improper specimen collection/handling, submission of specimen other than nasopharyngeal swab, presence of viral mutation(s) within the areas targeted by this assay, and inadequate number of viral copies(<138 copies/mL). A negative result must be combined with clinical observations, patient history, and epidemiological information. The expected result is Negative.  Fact Sheet for Patients:  bloggercourse.com  Fact Sheet for Healthcare Providers:  seriousbroker.it  This test is no t yet approved or cleared by the [place]  FDA and  has been authorized for detection and/or diagnosis of SARS-CoV-2 by FDA under an Emergency Use Authorization (EUA). This EUA will remain  in effect (meaning this test can be used) for the duration of the COVID-19 declaration under Section 564(b)(1) of the Act, 21 U.S.C.section 360bbb-3(b)(1), unless the authorization is terminated  or revoked sooner.       Influenza A by PCR NEGATIVE NEGATIVE Final   Influenza B by PCR NEGATIVE NEGATIVE Final    Comment: (NOTE) The Xpert Xpress SARS-CoV-2/FLU/RSV plus assay is intended as an aid in the diagnosis of influenza from Nasopharyngeal swab specimens and should not be used as a sole basis for treatment. Nasal washings and aspirates are unacceptable for Xpert Xpress SARS-CoV-2/FLU/RSV testing.  Fact Sheet for Patients: bloggercourse.com  Fact Sheet for Healthcare Providers: seriousbroker.it  This test is not yet approved or cleared by the [place]  FDA and has been authorized for detection and/or diagnosis of SARS-CoV-2 by FDA under an Emergency Use  Authorization (EUA). This EUA will remain in effect (meaning this test can be used) for the duration of the COVID-19 declaration under Section 564(b)(1) of the Act, 21 U.S.C. section 360bbb-3(b)(1), unless the authorization is terminated or revoked.     Resp Syncytial Virus by PCR NEGATIVE NEGATIVE Final    Comment: (NOTE) Fact Sheet for Patients: bloggercourse.com  Fact Sheet for Healthcare Providers: seriousbroker.it  This test is not yet approved or cleared by the [place]  FDA and has been authorized for detection and/or diagnosis of SARS-CoV-2 by FDA under an Emergency Use Authorization (EUA). This EUA will remain in effect (meaning this test can be used) for the duration of the COVID-19 declaration under Section 564(b)(1) of the Act, 21 U.S.C. section 360bbb-3(b)(1), unless the authorization is terminated or revoked.  Performed at [facility], [address], [place], [place] [address]       Radiology Studies last 3 days: DG Abd 1 View Result Date: 07/10/2024 CLINICAL DATA:  Feeding tube placement. EXAM:  ABDOMEN - 1 VIEW COMPARISON:  06/27/2024 FINDINGS: Tip of the weighted enteric tube is in the left upper abdomen in the region of the gastric body. No bowel dilatation in the included abdomen. Right upper quadrant surgical clips. IMPRESSION: Tip of the weighted enteric tube in the left upper abdomen in the region of the gastric body. Electronically Signed   By: Andrea Gasman M.D.   On: 07/10/2024 17:27   Rapid EEG Result Date: 07/10/2024 Shelton Arlin KIDD, MD     07/10/2024  8:31 PM Patient Name: [name] Phillips MRN: [id number] Epilepsy Attending: Arlin KIDD Shelton Referring Physician/Provider: Kathrene Almarie Bake, NP Duration: 07/10/2024 9391 to 1002 Patient history: 43 yo F with sudden onset episodes of unresponsiveness that lasted 30 seconds, one observed by me. Patient unresponsive and was confused  after. EEG to evaluate for seizure Level of alertness: Awake AEDs during EEG study: LEV, Ativan  Technical aspects: This EEG was obtained using a 10 lead EEG system positioned circumferentially without any parasagittal coverage (rapid EEG). Computer selected EEG is reviewed as  well as background features and all clinically significant events. Description: The posterior dominant rhythm consists of 10 Hz activity of moderate voltage (25-35 uV) seen predominantly in posterior head regions, symmetric and reactive to eye opening and eye closing. There is an excessive amount of 13-15 Hz distributed symmetrically and diffusely. Hyperventilation and photic stimulation were not performed.   ABNORMALITY - Excessive beta, generalized IMPRESSION: This limited ceribell eeg is within normal limits. No seizures or epileptiform discharges were seen throughout the recording. A normal interictal EEG does not exclude the diagnosis of epilepsy. Arlin KIDD Shelton   DG Chest Port 1 View Result Date: 07/09/2024 EXAM: 1 VIEW(S) XRAY OF THE CHEST 07/09/2024 07:13:00 PM COMPARISON: 07/07/2024 CLINICAL HISTORY: Respiratory failure with hypoxia (HCC) FINDINGS: LINES, TUBES AND DEVICES: Right PICC tip in right atrium. Endotracheal tube and enteric tube removed. LUNGS AND PLEURA: Interval extubation. Progressively low lung volumes with bronchovascular crowding. Improving peripheral right mid lung zone consolidation. No pleural effusion. No pneumothorax. HEART AND MEDIASTINUM: No acute abnormality of the cardiac and mediastinal silhouettes. BONES AND SOFT TISSUES: Partially imaged cervical fixation hardware noted. No acute osseous abnormality. IMPRESSION: 1. Improving peripheral right mid lung zone consolidation. 2. Progressively low lung volumes with bronchovascular crowding. 3. Right PICC tip in the right atrium. Electronically signed by: Dorethia Molt MD 07/09/2024 09:15 PM EST RP Workstation: [id number]           Laneta Blunt, DO [facility] 07/11/2024, 5:56 PM    Dictation software may have been used to generate the above note. Typos may occur and escape review in typed/dictated notes. Please contact Dr Blunt directly for clarity if needed.  Staff may message me via secure chat in Epic  but this may not receive an immediate response,  please page me for urgent matters!  If 7PM-7AM, please contact night coverage [email or web address]       "

## 2024-07-11 NOTE — Progress Notes (Signed)
 [facility] ADULT ICU REPLACEMENT PROTOCOL   The patient does apply for the [facility] Adult ICU Electrolyte Replacment Protocol based on the criteria listed below:   1.Exclusion criteria: TCTS, ECMO, Dialysis, and Myasthenia Gravis patients 2. Is GFR >/= 30 ml/min? Yes.    Patient's GFR today is >60 3. Is SCr </= 2? Yes.   Patient's SCr is 0.37 mg/dL 4. Did SCr increase >/= 0.5 in 24 hours? No. 5.Pt's weight >40kg  Yes.   6. Abnormal electrolyte(s): K, Mag  7. Electrolytes replaced per protocol 8.  Call MD STAT for K+ </= 2.5, Phos </= 1, or Mag </= 1 Physician:  Marion Hunter BRAVO [facility] 07/11/2024 5:42 AM

## 2024-07-11 NOTE — Progress Notes (Signed)
 NEUROLOGY CONSULT FOLLOW UP NOTE   Date of service: July 11, 2024 Patient Name: [name] Phillips MRN:  [id number] DOB:  1980/09/11  Interval Hx/subjective   - Did not tolerate MRI scans secondary to anxiety despite premedication with 5 mg diazepam   - Continuing to feel better daily, no new headache, no new or worsening neck pain   Vitals   Vitals:   07/11/24 0600 07/11/24 0700 07/11/24 0741 07/11/24 0800  BP: (!) 146/78 (!) 156/92    Pulse: 89 99    Resp: (!) 23 (!) 24    Temp:    98.3 F (36.8 C)  TempSrc:    Oral  SpO2: 97% 93% 93%   Height:         Body mass index is 27.79 kg/m.  Physical Exam   Constitutional: Appears much brighter today  Psych: Affect appropriate to situation, calm and cooperative Eyes: No scleral injection HENT: High flow oxygen in place. Uncomfortable to neck palpation but at her baseline chronic pain with no escalation of her pain MSK: no major joint deformities.  Cardiovascular: Regular rate, regular rhythm.  Perfusing extremities well Respiratory: Improved work of breathing GI: Soft.  No distension. There is no tenderness.  Skin: Warm dry and intact visible skin   Neurologic Examination    Mental Status: Patient is awake, alert, oriented to person, place, month, year, and situation. Patient is able to give a clear and coherent history. No signs of aphasia or neglect Cranial Nerves: II: Visual Fields are full. Pupils are equal, round, and reactive to light.   III,IV, VI: EOMI without ptosis or diploplia.  V: Facial sensation is symmetric to temperature VII: Facial movement is symmetric.  VIII: hearing is intact to voice X: Uvula difficult to visualize  XI: Shoulder shrug is symmetric. XII: tongue is midline without atrophy or fasciculations.  Motor: Improving tone. Bulk normal. 3/5 proximally (hip flexors, deltoids), 3/5 elbow extension/flexion, 4/5 wrist flexion extension,  Sensory: Sensation is symmetric to light touch  throughout  Deep Tendon Reflexes: 0 brachioradialis and 2+ patellae (tested 12/27) Cerebellar: High five intact bilaterallly  Gait:  Deferred due to critical illness and weakness   Medications Current Medications[1]  Labs and Diagnostic Imaging    Basic Metabolic Panel: Recent Labs  Lab 07/07/24 0502 07/07/24 1259 07/08/24 0449 07/09/24 0420 07/10/24 0031 07/10/24 0430 07/10/24 1652 07/11/24 0331  NA 141   < > 141 141 146* 149* 138 147*  K 3.8   < > 3.3* 3.8 3.6 3.6 3.3* 3.4*  CL 104   < > 102 103 105 106 101 110  CO2 28   < > 29 28 23 23  20* 27  GLUCOSE 87   < > 95 108* 112* 101* 373* 129*  BUN 23*   < > 21* 22* 21* 23* 20 19  CREATININE 0.31*   < > 0.32* 0.39* 0.41* 0.40* 0.36* 0.37*  CALCIUM  9.1   < > 9.1 9.3 9.6 9.7 8.7* 9.8  MG 1.9  --  1.7  --  1.7 1.7  --  1.9  PHOS 3.9  --  4.2 5.0*  --  3.7  --  3.8   < > = values in this interval not displayed.    CBC: Recent Labs  Lab 07/07/24 0502 07/08/24 0449 07/09/24 0420 07/10/24 0430 07/11/24 0331  WBC 19.0* 12.1* 13.2* 16.6* 13.9*  HGB 8.0* 7.9* 8.7* 9.6* 9.2*  HCT 25.3* 24.3* 27.7* 30.5* 29.5*  MCV 94.4 94.6 96.2 97.4 97.0  PLT 425* 491* 663* 885* 776*    Lipid Panel:  Lab Results  Component Value Date   LDLCALC 60 10/29/2023   HgbA1c:  Lab Results  Component Value Date   HGBA1C 6.0 (H) 06/29/2024   Urine Drug Screen:     Component Value Date/Time   LABOPIA NEGATIVE 06/27/2024 2117   COCAINSCRNUR POSITIVE (A) 06/27/2024 2117   COCAINSCRNUR NONE DETECTED 11/01/2018 1955   LABBENZ NEGATIVE 06/27/2024 2117   AMPHETMU NEGATIVE 06/27/2024 2117   THCU NEGATIVE 06/27/2024 2117   LABBARB NEGATIVE 06/27/2024 2117    Alcohol  Level     Component Value Date/Time   ETH 316 (HH) 11/01/2018 1954   INR No results found for: INR APTT No results found for: APTT AED levels:  Lab Results  Component Value Date   PHENYTOIN  <2.5 (L) 06/02/2014     [facility] EEG 07/10/2024 0608 to 0930  -  Excessive beta, generalized  Assessment   ORVILLE WIDMANN is a 43 y.o. female for whom neurology is consulted for a few spells of altered awareness. Differential includes partial seizure with altered awareness, delirium, or non-epileptic event. I think S. Pneumo meningitis is less likely given she has overall been improving clinically and mental status is quite excellent with no cranial nerve findings.  Unfortunately she was unable to tolerate a standard MRI and would prefer to pursue an open MRI.  Given her continued rapid clinical improvement, at this point I do think meningitis or abscess from strep pneumo is highly unlikely, and I will therefore cancel her imaging tests.  I also do not think that routine EEG would change management at this time as Keppra  would be expected to normalize her EEG.  However given her significant underlying mood disorders, will start lamotrigine , with plan for outpatient neurologist taper and eventually discontinue Keppra  as lamotrigine  achieves therapeutic levels.  Diagnoses:  Possible focal seizures with impaired awareness Weakness likely secondary to deconditioning from immobility and residual effects of sedation given rapid improvement   RECOMMENDATIONS  - Continue Keppra  500 mg BID, may consider tapering again outpatient - Add B6 50 mg daily to ameliorate behavioral side effects - Start lamotrigine  with plan to taper keppra  after week 8, taper to be determined in outpatient follow-up   Morning  Night  Week 1 and 2   none   25 mg   Week 3 and 4   25 mg    25 mg  Week 5   25 mg   50 mg  Week 6   50 mg   75 mg  Week 7   75 mg 100 mg  Week 8  100 mg 125 mg  - Seizure precautions discussed with patient and included in discharge instructions - Routine EEG will not change management at this time so will discontinue this order - Appreciate excellent care by ICU team and primary team, discussed with them via secure chat and in person    Standard seizure  precautions: Per [place]  DMV statutes, patients with seizures are not allowed to drive until  they have been seizure-free for six months. Use caution when using heavy equipment or power tools. Avoid working on ladders or at heights. Take showers instead of baths. Ensure the water  temperature is not too high on the home water  heater. Do not go swimming alone. When caring for infants or small children, sit down when holding, feeding, or changing them to minimize risk of injury to the child in the event you have a  seizure.  To reduce risk of seizures, maintain good sleep hygiene avoid alcohol  and illicit drug use, take all anti-seizure medications as prescribed.    ______________________________________________________________________   Bonney Lola Jernigan MD-PhD [facility] [phone]        [1]  Current Facility-Administered Medications:    acetaminophen  (TYLENOL ) tablet 650 mg, 650 mg, Oral, Q6H PRN, Chappell, Alex B, RPH   alum & mag hydroxide-simeth (MAALOX/MYLANTA) 200-200-20 MG/5ML suspension 30 mL, 30 mL, Per Tube, Q4H PRN, A[place]ura, Emily T, MD, 30 mL at 07/11/24 0617   artificial tears ophthalmic solution 1 drop, 1 drop, Both Eyes, PRN, Isadora Hose, MD, 1 drop at 07/08/24 0150   atorvastatin  (LIPITOR) tablet 10 mg, 10 mg, Per NG tube, Daily, Dgayli, Hose, MD   Chlorhexidine  Gluconate Cloth 2 % PADS 6 each, 6 each, Topical, Q0600, Zhang, Dekui, MD, 6 each at 07/11/24 0535   cholecalciferol  (VITAMIN D3) 25 MCG (1000 UNIT) tablet 2,000 Units, 2,000 Units, Per NG tube, Daily, Dgayli, Khabib, MD   enoxaparin  (LOVENOX ) injection 40 mg, 40 mg, Subcutaneous, Daily, Dgayli, Khabib, MD, 40 mg at 07/10/24 0916   feeding supplement (OSMOLITE 1.5 CAL) liquid 1,000 mL, 1,000 mL, Per Tube, Continuous, Dgayli, Hose, MD, Last Rate: 30 mL/hr at 07/11/24 0700, Infusion Verify at 07/11/24 0700   feeding supplement (PROSource TF20) liquid 60 mL, 60 mL, Per Tube, Daily,  Dgayli, Khabib, MD   folic acid  (FOLVITE ) tablet 1 mg, 1 mg, Per Tube, Daily, Dgayli, Khabib, MD   free water  30 mL, 30 mL, Per Tube, Q4H, Dgayli, Khabib, MD, 30 mL at 07/11/24 0535   hydrALAZINE  (APRESOLINE ) injection 10-20 mg, 10-20 mg, Intravenous, Q4H PRN, Rust-Chester, Jenita L, NP, 20 mg at 07/03/24 0241   HYDROmorphone  (DILAUDID ) injection 0.5-1 mg, 0.5-1 mg, Intravenous, Q3H PRN, Clair Marolyn NOVAK, RPH, 1 mg at 07/10/24 2143   HYDROmorphone  (DILAUDID ) injection 1 mg, 1 mg, Intravenous, Once, Shellia Mann D, NP   hydrOXYzine  (ATARAX ) tablet 25 mg, 25 mg, Per Tube, TID PRN, Isadora Hose, MD   insulin  aspart (novoLOG ) injection 0-15 Units, 0-15 Units, Subcutaneous, Q4H, Bowser, Grace E, NP, 2 Units at 07/11/24 0807   ipratropium-albuterol  (DUONEB) 0.5-2.5 (3) MG/3ML nebulizer solution 3 mL, 3 mL, Nebulization, TID, Dgayli, Khabib, MD, 3 mL at 07/11/24 0741   ketorolac  (TORADOL ) 15 MG/ML injection 15 mg, 15 mg, Intravenous, Q8H PRN, Bowser, Grace E, NP, 15 mg at 07/11/24 0211   levETIRAcetam  (KEPPRA ) undiluted injection 500 mg, 500 mg, Intravenous, Q12H, Dgayli, Khabib, MD, 500 mg at 07/10/24 2147   lidocaine  (LIDODERM ) 5 % 1 patch, 1 patch, Transdermal, Q24H, Bowser, Ronnald BRAVO, NP, 1 patch at 07/10/24 1637   Oral care mouth rinse, 15 mL, Mouth Rinse, Q2H, Dgayli, Khabib, MD, 15 mL at 07/11/24 0807   oxyCODONE  (Oxy IR/ROXICODONE ) immediate release tablet 30 mg, 30 mg, Per NG tube, Q6H, Dgayli, Khabib, MD, 30 mg at 07/11/24 0538   sertraline  (ZOLOFT ) tablet 100 mg, 100 mg, Per NG tube, QHS, Dgayli, Khabib, MD, 100 mg at 07/10/24 2154   sodium chloride  flush (NS) 0.9 % injection 10-40 mL, 10-40 mL, Intracatheter, Q12H, Dgayli, Khabib, MD, 10 mL at 07/10/24 2143   sodium chloride  flush (NS) 0.9 % injection 10-40 mL, 10-40 mL, Intracatheter, PRN, Isadora Hose, MD   sodium chloride  HYPERTONIC 3 % nebulizer solution 4 mL, 4 mL, Nebulization, BID, Dgayli, Khabib, MD, 4 mL at 07/11/24 0741    thiamine  (VITAMIN B1) tablet 100 mg, 100 mg, Per NG tube, Daily, Dgayli, Khabib,  MD

## 2024-07-11 NOTE — Plan of Care (Signed)
  Problem: Education: Goal: Knowledge of General Education information will improve Description: Including pain rating scale, medication(s)/side effects and non-pharmacologic comfort measures Outcome: Progressing   Problem: Health Behavior/Discharge Planning: Goal: Ability to manage health-related needs will improve Outcome: Progressing   Problem: Activity: Goal: Risk for activity intolerance will decrease Outcome: Progressing   Problem: Coping: Goal: Level of anxiety will decrease Outcome: Progressing   Problem: Skin Integrity: Goal: Risk for impaired skin integrity will decrease Outcome: Progressing   

## 2024-07-11 NOTE — Progress Notes (Signed)
 Physical Therapy Treatment Patient Details Name: [name] Phillips MRN: [id number] DOB: 04-01-1981 Today's Date: 07/11/2024   History of Present Illness Patient is a 43 year old female with acute hypoxic respiratory failure, severe strep pneumo pneumonia, strep pneumo bacteremia, septic shock. History of polysubstance and IV drug use, on suboxone  prior to presentation.  Extubated 07/08/24    PT Comments  Patient seated on EOB on arrival with RN and friend present. Patient initially eager to work with PT, however as session began, patient unsure of participation and required encouragement. Stood x 3 from EOB with minA+2 and HHAx2, however stands ~2-3 seconds prior to sitting but unable to provide reasoning on why. On last standing attempt, patient able to take 2 steps towards L with HHAx2 but quickly returns to sitting 2/2 endorsing fatigue on this attempt. Requesting to return to bed and lying on L side. Discharge plan remains appropriate.     If plan is discharge home, recommend the following: Two people to help with walking and/or transfers;Two people to help with bathing/dressing/bathroom;Assistance with cooking/housework;Assist for transportation;Help with stairs or ramp for entrance;Supervision due to cognitive status   Can travel by private vehicle        Equipment Recommendations  Other (comment) (TBD)    Recommendations for Other Services       Precautions / Restrictions Precautions Precautions: Fall Recall of Precautions/Restrictions: Impaired Restrictions Weight Bearing Restrictions Per Provider Order: No     Mobility  Bed Mobility Overal bed mobility: Needs Assistance Bed Mobility: Sit to Supine       Sit to supine: Min assist   General bed mobility comments: seated EOB on arrival. MinA for LE management    Transfers Overall transfer level: Needs assistance Equipment used: 2 person hand held assist Transfers: Sit to/from Stand Sit to Stand: Min assist, +2  physical assistance, +2 safety/equipment           General transfer comment: stood x 3 during session but onyl standing ~2-3 seconds before return to sitting but unable to provide reasoning. Able to take 2 steps towards L on last standing attempt but returns to sitting quickly 2/2 fatigue    Ambulation/Gait                   Stairs             Wheelchair Mobility     Tilt Bed    Modified Rankin (Stroke Patients Only)       Balance Overall balance assessment: Needs assistance Sitting-balance support: No upper extremity supported, Feet supported Sitting balance-Leahy Scale: Good     Standing balance support: Bilateral upper extremity supported, During functional activity Standing balance-Leahy Scale: Poor                              Communication Communication Communication: Impaired Factors Affecting Communication: Reduced clarity of speech  Cognition Arousal: Alert Behavior During Therapy: WFL for tasks assessed/performed   PT - Cognitive impairments: Awareness, Safety/Judgement, Problem solving                       PT - Cognition Comments: poor awareness into benefits of PT intermittently. requires encouragement to participate Following commands: Impaired Following commands impaired: Follows one step commands with increased time    Cueing Cueing Techniques: Verbal cues, Tactile cues, Visual cues  Exercises      General Comments General comments (skin integrity, edema, etc.):  On 3L HFNC on arrival, spO2 dropping to 86% with activity. Unable to increase despite PLB. increased O2 to 4L HFNC.      Pertinent Vitals/Pain Pain Assessment Pain Assessment: Faces Faces Pain Scale: No hurt Pain Intervention(s): Monitored during session    Home Living                          Prior Function            PT Goals (current goals can now be found in the care plan section) Acute Rehab PT Goals Patient Stated Goal: to  be able to get up and go home PT Goal Formulation: With patient Time For Goal Achievement: 07/21/24 Potential to Achieve Goals: Fair Progress towards PT goals: Progressing toward goals    Frequency    Min 2X/week      PT Plan      Co-evaluation              AM-PAC PT 6 Clicks Mobility   Outcome Measure  Help needed turning from your back to your side while in a flat bed without using bedrails?: A Lot Help needed moving from lying on your back to sitting on the side of a flat bed without using bedrails?: A Lot Help needed moving to and from a bed to a chair (including a wheelchair)?: A Lot Help needed standing up from a chair using your arms (e.g., wheelchair or bedside chair)?: Total Help needed to walk in hospital room?: Total Help needed climbing 3-5 steps with a railing? : Total 6 Click Score: 9    End of Session Equipment Utilized During Treatment: Oxygen Activity Tolerance: Patient tolerated treatment well Patient left: in bed;with call bell/phone within reach;with bed alarm set;with family/visitor present Nurse Communication: Mobility status PT Visit Diagnosis: Muscle weakness (generalized) (M62.81);Unsteadiness on feet (R26.81)     Time: 8852-8796 PT Time Calculation (min) (ACUTE ONLY): 16 min  Charges:    $Therapeutic Activity: 8-22 mins PT General Charges $$ ACUTE PT VISIT: 1 Visit                     Maryanne Finder, PT, DPT Physical Therapist - [facility]  [facility]    Alyna Stensland A Kellen Dutch 07/11/2024, 1:27 PM

## 2024-07-11 NOTE — Plan of Care (Signed)
  Problem: Education: Goal: Knowledge of General Education information will improve Description: Including pain rating scale, medication(s)/side effects and non-pharmacologic comfort measures Outcome: Progressing   Problem: Health Behavior/Discharge Planning: Goal: Ability to manage health-related needs will improve Outcome: Progressing   Problem: Clinical Measurements: Goal: Ability to maintain clinical measurements within normal limits will improve Outcome: Progressing Goal: Diagnostic test results will improve Outcome: Progressing Goal: Respiratory complications will improve Outcome: Progressing Goal: Cardiovascular complication will be avoided Outcome: Progressing   Problem: Activity: Goal: Risk for activity intolerance will decrease Outcome: Progressing   Problem: Nutrition: Goal: Adequate nutrition will be maintained Outcome: Progressing   Problem: Coping: Goal: Level of anxiety will decrease Outcome: Progressing   Problem: Elimination: Goal: Will not experience complications related to bowel motility Outcome: Progressing Goal: Will not experience complications related to urinary retention Outcome: Progressing   Problem: Pain Managment: Goal: General experience of comfort will improve and/or be controlled Outcome: Progressing   Problem: Safety: Goal: Ability to remain free from injury will improve Outcome: Progressing

## 2024-07-11 NOTE — Progress Notes (Signed)
 Transported patient to MRI 07/10/24 at 23:20.  Gave 5mg  diazepam  prior to scan for anxiety.  Shortly after scan began, patient notified technician that she could no longer tolerate scan due to anxiety.  She also refused abdominal CT scan.  NP Almarie Nose notified.

## 2024-07-11 NOTE — Progress Notes (Signed)
 Pt transferred to room 153. All belongings returned including phone, guitar, book bag, and notebook.

## 2024-07-11 NOTE — Progress Notes (Signed)
 PHARMACY CONSULT NOTE  Pharmacy Consult for Electrolyte Monitoring and Replacement   Recent Labs: Potassium (mmol/L)  Date Value  07/11/2024 3.4 (L)  10/05/2013 3.5   Magnesium  (mg/dL)  Date Value  87/71/7974 1.9  01/29/2013 2.1   Calcium  (mg/dL)  Date Value  87/71/7974 9.8   Calcium , Total (mg/dL)  Date Value  96/75/7984 9.1   Albumin (g/dL)  Date Value  87/71/7974 3.5  10/05/2013 4.3   Phosphorus (mg/dL)  Date Value  87/71/7974 3.8  07/21/2012 2.1 (L)   Sodium (mmol/L)  Date Value  07/11/2024 147 (H)  10/05/2013 135 (L)    Assessment: 43 y.o. female with medical history significant of essential hypertension, seizure disorder, polysubstance abuse, recent use of fentanyl  and heroin, who presents to the hospital with progressively worsening short of breath, hypoxia.  Pharmacy is asked to follow and replace electrolytes while in CCU  Patient was extubated 12/25. Pending swallow screen  Goal of Therapy:  Electrolytes WNL  Plan:  --Mag 2g IV and KCL 40mEq x 1 ordered by PCCM --No additional replacement warranted at this time.  --Follow-up electrolytes with AM labs tomorrow  Estill CHRISTELLA Lutes, PharmD, BCPS Clinical Pharmacist 07/11/2024 7:05 AM

## 2024-07-11 NOTE — Discharge Instructions (Addendum)
 You had some episodes in the hospital concerning for seizures which improved with starting levetiracetam  (Keppra ).  To control seizures, your medications have been adjusted as follows:  - Take Keppra  500 mg twice daily - Take B6 25 mg daily to reduce potential mood side effects from Keppra  - Start lamotrigine  with gradual increase as below to minimize risk of any side effects (please note day 1 is 07/11/2024)   Morning  Night  Week 1 and 2   none   25 mg   Week 3 and 4   25 mg    25 mg  Week 5   25 mg   50 mg  Week 6   50 mg   75 mg  Week 7   75 mg 100 mg  Week 8  100 mg 125 mg  - Depending on how you are doing, the preliminary plan is to eventually stop the Keppra  and be on lamotrigine  for mood and seizure control   Please keep track of any other spells you have, including shaking spells, loss of consciousness events, staring spells etc to review with your neurologist   Spell log:  - Date and time of event: - Description of event:  - Prodome (any warning / premonition event is going to happen): - Post-spell symptoms: - Any potential triggers:   As an example, this was your event in the hospital - Date and time of event: 07/09/2024, 3 events - Description of event: 30 seconds to a 1 minute of lack of responsiveness followed by confusion - Prodome (any warning / premonition event is going to happen): No, - Post-spell symptoms: Mild confusion but gradually cleared - Any potential triggers: Excitement about seeing son, getting to eat ice chips.  Sedation that was being rapidly weaned  Standard seizure precautions: Per [place]  DMV statutes, patients with seizures are not allowed to drive until  they have been seizure-free for six months. Use caution when using heavy equipment or power tools. Avoid working on ladders or at heights. Take showers instead of baths. Ensure the water  temperature is not too high on the home water  heater. Do not go swimming alone. When caring for infants  or small children, sit down when holding, feeding, or changing them to minimize risk of injury to the child in the event you have a seizure.  To reduce risk of seizures, maintain good sleep hygiene avoid alcohol  and illicit drug use, take all anti-seizure medications as prescribed.     [facility] They will call you to set up when they are coming out to see you   [address], [place], [place] [address] Hours:  Open ? Closes 5?PM Phone: [phone]

## 2024-07-12 ENCOUNTER — Inpatient Hospital Stay

## 2024-07-12 DIAGNOSIS — J9601 Acute respiratory failure with hypoxia: Secondary | ICD-10-CM | POA: Diagnosis not present

## 2024-07-12 DIAGNOSIS — R4182 Altered mental status, unspecified: Secondary | ICD-10-CM

## 2024-07-12 LAB — RESPIRATORY PANEL BY PCR

## 2024-07-12 LAB — GLUCOSE, CAPILLARY
Glucose-Capillary: 124 mg/dL — ABNORMAL HIGH (ref 70–99)
Glucose-Capillary: 132 mg/dL — ABNORMAL HIGH (ref 70–99)
Glucose-Capillary: 152 mg/dL — ABNORMAL HIGH (ref 70–99)
Glucose-Capillary: 92 mg/dL (ref 70–99)
Glucose-Capillary: 115 mg/dL — ABNORMAL HIGH (ref 70–99)

## 2024-07-12 MED ORDER — FREE WATER
100.0000 mL | Status: DC
  Administered 2024-07-12 – 2024-07-13 (×6): 100 mL

## 2024-07-12 NOTE — Progress Notes (Signed)
 Nutrition Follow-up  DOCUMENTATION CODES:   Not applicable  INTERVENTION:   -RD will follow for diet advancement and add supplements as appropriate -TF via NGT:   Osmolite 1.5 @ 60 ml/hr  60 ml Prosource TF20 daily  100 ml free water  every 4 hours   Tube feeding regimen provides 2240 kcal (100% of needs), 110 grams of protein, and 1097 ml of H2O. Total free water : 1097 ml daily   -Continue MVI with minerals daily via tube -Continue 1 mg folic acid  daily via tube -Continue 100 mg thiamine  daily  -Continue Cholecalciferol  2,000 daily via tube x 30 days  -Continue daily weights  -Continue 25 mg pyridoxine  daily per neurology   100 every 4 hours  NUTRITION DIAGNOSIS:   Inadequate oral intake related to inability to eat (pt sedated and ventilated) as evidenced by NPO status.  Ongoing  GOAL:   Patient will meet greater than or equal to 90% of their needs  Met with TF  MONITOR:   Diet advancement, Labs, Weight trends, Skin, I & O's  REASON FOR ASSESSMENT:   Ventilator    ASSESSMENT:   43 y/o female with h/o anxiety, HTN, MDD, HCV, hepatic steatosis, HLD, GERD, seizures, etoh and substance abuse who is admitted with bacterial PNA, septic shock, bactermia and AKI.  12/24- rectal tube placed 12/25- extubated 12/26- s/p BSE- NPO 12/27- s/p BSE- NPO, NGT placed, tip of tube in stomach per KUB, TF initiated  Reviewed I/O's: -707 ml x 24 hours and +3.9 L since 06/28/24  UOP: 1.5 L x 24 hours  Per PCCM notes on 07/09/24, patient with episode of unresponsiveness; suspect absence seizure, Plan for EEG and neurology consult on 07/10/24.   Patient could not tolerate MRI on 07/11/24 due to anxiety and refused abdominal CT scan.   Per neurology note on 07/11/24, B6 50 mg daily added to ameliorate behavioral side effects.  Patient sitting up in bed at time of visit. Patient mumbling and agitated at time of visit. She remains NPO and receiving TF via NGT for sole  nutrition support. Osmolite 1.5 infusing at goal rate of 60 ml/hr. Patient is tolerating well.   Reviewed weight history; weight has ranged from 68.8-73.5 kg since admission.   CIR following for possible admission.   Medications reviewed and include lovenox , vitamin D3, folic acid , keppra , vitamin B6, and thiamine .   Labs reviewed: Na: 146, CBGS: 107-148 (inpatient orders for glycemic control are none). K, Mg, and Phos WDL.   NUTRITION - FOCUSED PHYSICAL EXAM:  Flowsheet Row Most Recent Value  Orbital Region No depletion  Upper Arm Region No depletion  Thoracic and Lumbar Region No depletion  Buccal Region No depletion  Temple Region No depletion  Clavicle Bone Region No depletion  Clavicle and Acromion Bone Region No depletion  Scapular Bone Region No depletion  Dorsal Hand No depletion  Patellar Region No depletion  Anterior Thigh Region No depletion  Posterior Calf Region No depletion  Edema (RD Assessment) None  Hair Reviewed  Eyes Reviewed  Mouth Reviewed  Skin Reviewed  Nails Reviewed    Diet Order:   Diet Order             Diet NPO time specified Except for: Ice Chips  Diet effective now                   EDUCATION NEEDS:   No education needs have been identified at this time  Skin:  Skin Assessment: Reviewed RN Assessment  Last BM:  07/11/24 (type 7)  Height:   Ht Readings from Last 1 Encounters:  07/04/24 5' 4.02 (1.626 m)    Weight:   Wt Readings from Last 1 Encounters:  07/12/24 68.8 kg    Ideal Body Weight:  54.5 kg  BMI:  Body mass index is 26.02 kg/m.  Estimated Nutritional Needs:   Kcal:  1900-2200kcal/day  Protein:  95-110g/day  Fluid:  1.7-1.9L/day    Margery ORN, RD, LDN, CDCES Registered Dietitian III Certified Diabetes Care and Education Specialist If unable to reach this RD, please use RD Inpatient group chat on secure chat between hours of 8am-4 pm daily

## 2024-07-12 NOTE — Progress Notes (Signed)
 Pt has a triple lumen PICC. Two lumens are occluded, only one flushes w/ blood return.

## 2024-07-12 NOTE — Progress Notes (Signed)
 Physical Therapy Treatment Patient Details Name: [name] Phillips MRN: [id number] DOB: 07-Jan-1981 Today's Date: 07/12/2024   History of Present Illness Patient is a 43 year old female with acute hypoxic respiratory failure, severe strep pneumo pneumonia, strep pneumo bacteremia, septic shock. History of polysubstance and IV drug use, on suboxone  prior to presentation.  Extubated 07/08/24    PT Comments  Pt seen for PT tx with pt agreeable. Pt is pleasant, AxOx4 but presents with higher level cognitive deficits. Pt is able to transfer sit>stand with min assist +1-2 with cuing re: use of RW. Pt transfers bed>recliner via step pivot, does attempt to take steps forwards & able to move each foot but then abruptly sits down. Minimal activity causes increase in HR & SpO2 to drop to low 80s. Recommend ongoing PT services to progress mobility as able.    If plan is discharge home, recommend the following: Two people to help with walking and/or transfers;Two people to help with bathing/dressing/bathroom;Assistance with cooking/housework;Assist for transportation;Help with stairs or ramp for entrance;Supervision due to cognitive status   Can travel by private vehicle        Equipment Recommendations  Other (comment) (defer to next venue)    Recommendations for Other Services Rehab consult     Precautions / Restrictions Precautions Precautions: Fall Restrictions Weight Bearing Restrictions Per Provider Order: No     Mobility  Bed Mobility Overal bed mobility: Independent Bed Mobility: Supine to Sit     Supine to sit: Min assist, HOB elevated, Used rails (exit L side of bed, cuing to place BLE feet on floor)          Transfers Overall transfer level: Needs assistance Equipment used: Rolling walker (2 wheels) Transfers: Sit to/from Stand, Bed to chair/wheelchair/BSC Sit to Stand: Min assist, +2 physical assistance, +2 safety/equipment (+1-2)   Step pivot transfers: Min assist, +2  physical assistance, +2 safety/equipment (bed>recliner on L with RW)       General transfer comment: cuing re: hand placement, sequencing    Ambulation/Gait                   Stairs             Wheelchair Mobility     Tilt Bed    Modified Rankin (Stroke Patients Only)       Balance Overall balance assessment: Needs assistance Sitting-balance support: Feet supported, Bilateral upper extremity supported Sitting balance-Leahy Scale: Fair     Standing balance support: Bilateral upper extremity supported, During functional activity, Reliant on assistive device for balance Standing balance-Leahy Scale: Poor Standing balance comment: 2/2 BLE weakness, buckling                            Communication Communication Communication: No apparent difficulties Factors Affecting Communication: Reduced clarity of speech  Cognition Arousal: Alert Behavior During Therapy: WFL for tasks assessed/performed   PT - Cognitive impairments: Awareness, Safety/Judgement, Problem solving, Attention, Initiation, Memory, Sequencing                       PT - Cognition Comments: AxOx4 but impaired higher level cognitive deficits, holding phone reporting she's texting her boss but continues to hold & look at phone, during standing/stepping attempt pt abruptly sits, reporting I need some clothes on Following commands: Impaired Following commands impaired: Follows one step commands with increased time    Cueing Cueing Techniques: Verbal cues, Tactile cues, Visual  cues  Exercises      General Comments General comments (skin integrity, edema, etc.): pt on 3L/min via nasal cannula with SpO2 lowest of 83%, max HR 134 bpm with activity, rest breaks & cuing re: pursed lip breathing with fair return demo to allow SpO2 to increase      Pertinent Vitals/Pain Pain Assessment Pain Assessment: 0-10 Pain Score: 9  Pain Location: back Pain Descriptors / Indicators:  Aching, Discomfort Pain Intervention(s): Monitored during session, Limited activity within patient's tolerance, Repositioned, Patient requesting pain meds-RN notified    Home Living                          Prior Function            PT Goals (current goals can now be found in the care plan section) Acute Rehab PT Goals Patient Stated Goal: to be able to get up and go home PT Goal Formulation: With patient Time For Goal Achievement: 07/21/24 Potential to Achieve Goals: Fair Progress towards PT goals: Progressing toward goals    Frequency    Min 3X/week      PT Plan      Co-evaluation PT/OT/SLP Co-Evaluation/Treatment: Yes Reason for Co-Treatment: For patient/therapist safety;Complexity of the patient's impairments (multi-system involvement);To address functional/ADL transfers PT goals addressed during session: Mobility/safety with mobility;Balance;Proper use of DME OT goals addressed during session: ADL's and self-care      AM-PAC PT 6 Clicks Mobility   Outcome Measure  Help needed turning from your back to your side while in a flat bed without using bedrails?: A Little Help needed moving from lying on your back to sitting on the side of a flat bed without using bedrails?: A Lot Help needed moving to and from a bed to a chair (including a wheelchair)?: A Lot Help needed standing up from a chair using your arms (e.g., wheelchair or bedside chair)?: A Lot Help needed to walk in hospital room?: Total Help needed climbing 3-5 steps with a railing? : Total 6 Click Score: 11    End of Session Equipment Utilized During Treatment: Oxygen Activity Tolerance: Patient limited by fatigue;Patient limited by pain Patient left: in chair;with chair alarm set;with call bell/phone within reach;with nursing/sitter in room Nurse Communication: Mobility status PT Visit Diagnosis: Muscle weakness (generalized) (M62.81);Unsteadiness on feet (R26.81);Other abnormalities of gait  and mobility (R26.89);Difficulty in walking, not elsewhere classified (R26.2)     Time: 8475-8450 PT Time Calculation (min) (ACUTE ONLY): 25 min  Charges:    $Therapeutic Activity: 8-22 mins PT General Charges $$ ACUTE PT VISIT: 1 Visit                     [name] Phillips, PT, DPT 07/12/2024, 4:03 PM   [name] Phillips 07/12/2024, 4:02 PM

## 2024-07-12 NOTE — Plan of Care (Signed)
" °  Problem: Health Behavior/Discharge Planning: Goal: Ability to manage health-related needs will improve Outcome: Progressing   Problem: Clinical Measurements: Goal: Respiratory complications will improve Outcome: Progressing   Problem: Clinical Measurements: Goal: Cardiovascular complication will be avoided Outcome: Progressing   Problem: Activity: Goal: Risk for activity intolerance will decrease Outcome: Progressing   Problem: Nutrition: Goal: Adequate nutrition will be maintained Outcome: Progressing   Problem: Elimination: Goal: Will not experience complications related to bowel motility Outcome: Progressing   Problem: Elimination: Goal: Will not experience complications related to urinary retention Outcome: Progressing   Problem: Pain Managment: Goal: General experience of comfort will improve and/or be controlled Outcome: Progressing   Problem: Safety: Goal: Ability to remain free from injury will improve Outcome: Progressing   "

## 2024-07-12 NOTE — Plan of Care (Signed)
" °  Problem: Education: Goal: Knowledge of General Education information will improve Description: Including pain rating scale, medication(s)/side effects and non-pharmacologic comfort measures Outcome: Progressing   Problem: Health Behavior/Discharge Planning: Goal: Ability to manage health-related needs will improve Outcome: Progressing   Problem: Clinical Measurements: Goal: Ability to maintain clinical measurements within normal limits will improve Outcome: Progressing Goal: Will remain free from infection Outcome: Progressing Goal: Diagnostic test results will improve Outcome: Progressing Goal: Respiratory complications will improve Outcome: Progressing Goal: Cardiovascular complication will be avoided Outcome: Progressing   Problem: Activity: Goal: Risk for activity intolerance will decrease Outcome: Progressing   Problem: Nutrition: Goal: Adequate nutrition will be maintained Outcome: Progressing   Problem: Coping: Goal: Level of anxiety will decrease Outcome: Progressing   Problem: Elimination: Goal: Will not experience complications related to bowel motility Outcome: Progressing Goal: Will not experience complications related to urinary retention Outcome: Progressing   Problem: Pain Managment: Goal: General experience of comfort will improve and/or be controlled Outcome: Progressing   Problem: Safety: Goal: Ability to remain free from injury will improve Outcome: Progressing   Problem: Skin Integrity: Goal: Risk for impaired skin integrity will decrease Outcome: Progressing   Problem: Activity: Goal: Ability to tolerate increased activity will improve Outcome: Progressing   Problem: Clinical Measurements: Goal: Ability to maintain a body temperature in the normal range will improve Outcome: Progressing   Problem: Respiratory: Goal: Ability to maintain adequate ventilation will improve Outcome: Progressing Goal: Ability to maintain a clear airway  will improve Outcome: Progressing   Problem: Activity: Goal: Ability to tolerate increased activity will improve Outcome: Progressing   Problem: Respiratory: Goal: Ability to maintain a clear airway and adequate ventilation will improve Outcome: Progressing   Problem: Role Relationship: Goal: Method of communication will improve Outcome: Progressing   Problem: Education: Goal: Ability to describe self-care measures that may prevent or decrease complications (Diabetes Survival Skills Education) will improve Outcome: Progressing   Problem: Coping: Goal: Ability to adjust to condition or change in health will improve Outcome: Progressing   Problem: Fluid Volume: Goal: Ability to maintain a balanced intake and output will improve Outcome: Progressing   Problem: Health Behavior/Discharge Planning: Goal: Ability to identify and utilize available resources and services will improve Outcome: Progressing Goal: Ability to manage health-related needs will improve Outcome: Progressing   Problem: Metabolic: Goal: Ability to maintain appropriate glucose levels will improve Outcome: Progressing   Problem: Nutritional: Goal: Maintenance of adequate nutrition will improve Outcome: Progressing Goal: Progress toward achieving an optimal weight will improve Outcome: Progressing   Problem: Skin Integrity: Goal: Risk for impaired skin integrity will decrease Outcome: Progressing   Problem: Tissue Perfusion: Goal: Adequacy of tissue perfusion will improve Outcome: Progressing   "

## 2024-07-12 NOTE — TOC Progression Note (Signed)
 Transition of Care [facility]) - Progression Note    Patient Details  Name: ALETTE KATAOKA MRN: [id number] Date of Birth: 06/28/1981  [facility] CM/SW Contact  Alvaro Louder, [place] Phone Number: 07/12/2024, 1:02 PM  Clinical Narrative:  Per PT and OT recs [facility] reached out to [facility] admin coordinator. Patient has been screened for CIR.     TOC to follow for discharge                    Expected Discharge Plan and Services                                               Social Drivers of Health (SDOH) Interventions SDOH Screenings   Food Insecurity: No Food Insecurity (06/27/2024)  Housing: Low Risk (06/27/2024)  Transportation Needs: No Transportation Needs (06/27/2024)  Utilities: Not At Risk (06/27/2024)  Depression (PHQ2-9): High Risk (10/29/2023)  Tobacco Use: Medium Risk (06/27/2024)    Readmission Risk Interventions    07/06/2024    3:19 PM  Readmission Risk Prevention Plan  Transportation Screening Complete  PCP or Specialist Appt within 3-5 Days Complete  HRI or Home Care Consult Complete  Social Work Consult for Recovery Care Planning/Counseling Complete  Palliative Care Screening Not Applicable  Medication Review Ocean[employer]) Complete

## 2024-07-12 NOTE — Progress Notes (Signed)
 Occupational Therapy Treatment Patient Details Name: [name] Phillips MRN: [id number] DOB: 1981-06-19 Today's Date: 07/12/2024   History of present illness Patient is a 43 year old female with acute hypoxic respiratory failure, severe strep pneumo pneumonia, strep pneumo bacteremia, septic shock. History of polysubstance and IV drug use, on suboxone  prior to presentation.  Extubated 07/08/24   OT comments  Pt seen for OT and PT overlapping co-tx. Pt seated EOB with PT. Noted issue with leaking NG tube feeds. RN came to assess. Linens changed while pt in standing with PT. MIN A +2 safety for steps to recliner, cues for safety. Sat prematurely requiring stand again to improve positioning. MOD A for gown change, pt requiring cues for sequencing/initiating. SpO2 desats to 81-84% on 3L, HR up to 134 with activity. With seated rest break and cues for PLB, improves to 90's and HR back down to 100's. Pt demo's impaired cognition, with increased processing time, impaired judgment and awareness of safety and deficits. Pt edu in PLB to support recovery, edu IS and flutter valve. Pt endorsed knowing how to use but then unable to demo proper technique. With cues, able to return demo proper technique. Pt continues to benefit from high intensity skilled OT services as she improves medically. Pt motivated.       If plan is discharge home, recommend the following:  Two people to help with walking and/or transfers;Two people to help with bathing/dressing/bathroom   Equipment Recommendations  Other (comment) (TBD)    Recommendations for Other Services Rehab consult    Precautions / Restrictions Precautions Precautions: Fall Recall of Precautions/Restrictions: Impaired Restrictions Weight Bearing Restrictions Per Provider Order: No       Mobility Bed Mobility Overal bed mobility: Needs Assistance             General bed mobility comments: sitting EOB upon OT arrival, defer to PT note for details     Transfers Overall transfer level: Needs assistance Equipment used: Rolling walker (2 wheels) Transfers: Sit to/from Stand, Bed to chair/wheelchair/BSC Sit to Stand: Min assist, +2 physical assistance, +2 safety/equipment     Step pivot transfers: Min assist, +2 physical assistance, +2 safety/equipment     General transfer comment: VC for sequencing, poor safety awareness     Balance Overall balance assessment: Needs assistance Sitting-balance support: No upper extremity supported, Feet supported Sitting balance-Leahy Scale: Good     Standing balance support: Bilateral upper extremity supported, During functional activity, Reliant on assistive device for balance Standing balance-Leahy Scale: Poor                             ADL either performed or assessed with clinical judgement   ADL Overall ADL's : Needs assistance/impaired Eating/Feeding: NPO Eating/Feeding Details (indicate cue type and reason): feeding tube             Upper Body Dressing : Sitting;Moderate assistance Upper Body Dressing Details (indicate cue type and reason): gown change     Toilet Transfer: Moderate assistance;Minimal assistance;Rolling walker (2 wheels);Cueing for sequencing Toilet Transfer Details (indicate cue type and reason): simulated to recliner, step pivot, cues for sequencing                 Communication Communication Communication: No apparent difficulties Factors Affecting Communication: Reduced clarity of speech   Cognition Arousal: Alert Behavior During Therapy: WFL for tasks assessed/performed Cognition: Cognition impaired     Awareness: Online awareness impaired, Intellectual awareness impaired  Attention impairment (select first level of impairment): Sustained attention Executive functioning impairment (select all impairments): Problem solving, Sequencing, Reasoning OT - Cognition Comments: slow processing, believes she is here because she had a  breathing tube; difficulty sequencing and problem solving                 Following commands: Impaired Following commands impaired: Follows one step commands with increased time      Cueing   Cueing Techniques: Verbal cues, Tactile cues, Visual cues  Exercises Other Exercises Other Exercises: Pt edu in PLB to support recovery, edu IS and flutter valve. Pt endorsed knowing how to use but then unable to demo proper technique. With cues, able to return demo proper technique.    Shoulder Instructions       General Comments pt on 3L/min via nasal cannula with SpO2 lowest of 83%, max HR 134 bpm with activity, rest breaks & cuing re: pursed lip breathing with fair return demo to allow SpO2 to increase    Pertinent Vitals/ Pain       Pain Assessment Pain Assessment: 0-10 Pain Score: 9  Pain Location: back Pain Descriptors / Indicators: Aching Pain Intervention(s): Limited activity within patient's tolerance, Monitored during session, Repositioned, Patient requesting pain meds-RN notified  Home Living                                          Prior Functioning/Environment              Frequency  Min 3X/week        Progress Toward Goals  OT Goals(current goals can now be found in the care plan section)  Progress towards OT goals: Progressing toward goals  Acute Rehab OT Goals Patient Stated Goal: get stronger OT Goal Formulation: With patient/family Time For Goal Achievement: 08/04/24 Potential to Achieve Goals: Fair  Plan      Co-evaluation    PT/OT/SLP Co-Evaluation/Treatment: Yes Reason for Co-Treatment: For patient/therapist safety;Complexity of the patient's impairments (multi-system involvement);To address functional/ADL transfers PT goals addressed during session: Mobility/safety with mobility;Balance;Proper use of DME OT goals addressed during session: ADL's and self-care      AM-PAC OT 6 Clicks Daily Activity     Outcome  Measure   Help from another person eating meals?: A Lot (NPO) Help from another person taking care of personal grooming?: A Lot Help from another person toileting, which includes using toliet, bedpan, or urinal?: A Lot Help from another person bathing (including washing, rinsing, drying)?: A Lot Help from another person to put on and taking off regular upper body clothing?: A Lot Help from another person to put on and taking off regular lower body clothing?: A Lot 6 Click Score: 12    End of Session Equipment Utilized During Treatment: Oxygen;Rolling walker (2 wheels)  OT Visit Diagnosis: Unsteadiness on feet (R26.81);Repeated falls (R29.6);Muscle weakness (generalized) (M62.81)   Activity Tolerance Patient tolerated treatment well   Patient Left in chair;with call bell/phone within reach;with chair alarm set   Nurse Communication Patient requests pain meds        Time: 1530-1551 OT Time Calculation (min): 21 min  Charges: OT General Charges $OT Visit: 1 Visit OT Treatments $Therapeutic Activity: 8-22 mins  Warren SAUNDERS., MPH, MS, OTR/L ascom [phone] 07/12/2024, 4:16 PM

## 2024-07-12 NOTE — Progress Notes (Signed)
 Per NT, BG this am at 0725 was 154. The glucometer did not transfer the results. BG reported directly to RN.

## 2024-07-12 NOTE — Progress Notes (Signed)
 Per NT [place] blood sugar at 124. Glucometer not sinking yet.

## 2024-07-12 NOTE — Progress Notes (Signed)

## 2024-07-12 NOTE — Progress Notes (Signed)
 Eeg done

## 2024-07-12 NOTE — Progress Notes (Signed)
 " PROGRESS NOTE    [name] Phillips   FMW:[id number] DOB: [birth year]  DOA: 06/27/2024 Date of Service: 07/12/2024 which is hospital day 15  PCP: Corwin Antu, [facility] course / significant events:   HPI: 43 year old female with history of polysubstance use (IVDU with fentanyl , last injection 4 days ago) who presents with increased shortness of breath and admitted for management of pneumonia. She reports symptom onset around over a week ago with fevers and chills. This improved with symptomatic management, but she then developed increased shortness of breath and cough over the past couple of days prompting presentation to the ED. She reports a cough productive of yellow sputum. She has not had any sick contacts, nor has she been sick like this in the past. Patient reports recent travel in November to [place], [place] but denies any hikes or venturing into the desert. She reports recently staying at a hotel here in [place] . She reports a history of IVDU, having used IV fentanyl  4 days ago, and also reports snorting narcotics and vape use. She gets her needles from an exchange program where she also procures the water  used for injection. Denies any history of endocarditis.  12/14: admit with right sided pneumonia and respiratory failure. CXR with right lung white out, INTUBATED, S/p BRONCH, ART LINE PLACED 12/14: BLOOD CX +STREP PNEUMONIA 12/15: remains on vent 12/16: remains on vent 12/17: remains on vent, severe hypoxia, CVL placed 12/18: severe Hypoxia s/p BRONCH mucoid secretions, worsening CXR, ABX broadened to cover Pseudomonas and MRSA 12/19: severe hypoxia 12/21: attempt sedation wean, diurese 12/22: Remains critically ill requiring mechanical ventilation, levophed  and multiple sedation agents. Diuresed with Lasix  40mg  yesterday; 5.9L UO. Will attempt at [facility] today and wean vent requirements if able 12/23: Remains critically ill requiring mechanical ventilation. Still on  levophed , propofol , dilaudid  and precedex . O2 requirements back to 60%; went up to 100% yesterday d/t ETT progressing into right mainstem and needed retraction. Required versed  early this am for acute agitation when trying to get a bath. Wean vent settings as able. Will hold of on weaning sedation today as she is awake on current sedation requirements 12/24: Remains critically ill requiring mechanical ventilation. Low pressor requirements and decreasing sedation needs. Wean vent settings as able. Awake and able to answer questions appropriately. ENT following for possible trach placement next week. 12/25: Extubated to HHFNC, requiring Precedex  for anxiety/delirium. Diuresed w 4.3L UOP  12/26: Tolerating extubation, wean HHFNC as able.  Wean precedex , clonidine  started  12/27 remains in ICU on ceribell  12/28: improving and transfer to [facility] hospitalist 12/29: SLP following, still oropharyngeal dysphagia, NG still in place and patient n.p.o. otherwise.  Evaluation in process for inpatient rehab.     Consultants:  [facility] Neurology ENT   Procedures/Surgeries:        ASSESSMENT & PLAN:   Respiratory failure secondary to severe pneumonia from S. Pneumo.  Extubated 12/25 to hi-flo --> tapered PT/OT mobilize secretions (hypertonic saline, vest, flutter device).  Swallowing dysfunction NG in place for now SLP following, okay to place diet orders at their discretion  Septic shock secondary to bacteremia  now resolved.  off vasopressors, abx Monitor   AKI on presentation, improved.  Monitor BMP  history of polysubstance and IV drug use suboxone  prior to presentation.  All sedation and drips are discontinued after extubation.  IV hydromorphone  PRN until we establish PO   Seizure history Appreciate neurology recs - see note 12/28 Continue Keppra  500  mg BID, may consider tapering again outpatient Add B6 50 mg daily to ameliorate behavioral side effects Start lamotrigine  with plan to  taper keppra  after week 8, taper to be determined in outpatient follow-up NO DRIVING Seizure precautions   Prediabetes w hyperglycemia  -off EN, off Steroids, starting to have more normoglycemia than hypergly reads  dc basal insulin  and change SSI to moderate from resistant     overweight based on BMI: Body mass index is 27.79 kg/m.SABRA Significantly low or high BMI is associated with higher medical risk.  Underweight - under 18  overweight - 25 to 29 obese - 30 or more Class 1 obesity: BMI of 30.0 to 34 Class 2 obesity: BMI of 35.0 to 39 Class 3 obesity: BMI of 40.0 to 49 Super Morbid Obesity: BMI 50-59 Super-super Morbid Obesity: BMI 60+ Healthy nutrition and physical activity advised as adjunct to other disease management and risk reduction treatments    DVT prophylaxis: lovenox  IV fluids: no continuous IV fluids  Nutrition: NPO ice chips, tube feeds NG Central lines / other devices: NG, Foley  Code Status: FULL CODE ACP documentation reviewed:  none on file in [facility]  TOC needs: TBD Medical barriers to dispo: NG in place, hypernatremia, NPO. Expected medical readiness for discharge few days.              Subjective / Brief ROS:  Patient reports feeling better today  No other complaints at this time Denies CP/SOB.  Pain controlled.  Denies new weakness. .  Reports no concerns w/ defecation.   Family Communication: none at bedside on rounds    Objective Findings:  Vitals:   07/12/24 0500 07/12/24 0730 07/12/24 1451 07/12/24 1505  BP:  (!) 149/75  (!) 146/80  Pulse:  100  91  Resp:  17  17  Temp:  98.2 F (36.8 C)  98.3 F (36.8 C)  TempSrc:  Oral  Oral  SpO2:  95% 95% 98%  Weight: 68.8 kg     Height:        Intake/Output Summary (Last 24 hours) at 07/12/2024 1837 Last data filed at 07/12/2024 1836 Gross per 24 hour  Intake 623.33 ml  Output 625 ml  Net -1.67 ml   Filed Weights   07/12/24 0500  Weight: 68.8 kg    Examination:   Physical Exam Constitutional:      General: She is not in acute distress. Cardiovascular:     Rate and Rhythm: Regular rhythm. Tachycardia present.  Pulmonary:     Effort: Pulmonary effort is normal.     Breath sounds: No decreased breath sounds or wheezing.  Musculoskeletal:     Right lower leg: No edema.     Left lower leg: No edema.  Skin:    General: Skin is warm and dry.  Neurological:     Mental Status: She is alert and oriented to person, place, and time.  Psychiatric:        Mood and Affect: Mood normal.        Behavior: Behavior normal.          Scheduled Medications:   atorvastatin   10 mg Per NG tube Daily   Chlorhexidine  Gluconate Cloth  6 each Topical Q0600   cholecalciferol   2,000 Units Per NG tube Daily   enoxaparin  (LOVENOX ) injection  40 mg Subcutaneous Daily   feeding supplement (PROSource TF20)  60 mL Per Tube Daily   folic acid   1 mg Per Tube Daily   free water   100 mL Per Tube Q4H   insulin  aspart  0-15 Units Subcutaneous Q4H   ipratropium-albuterol   3 mL Nebulization TID   lamoTRIgine   25 mg Oral QHS   Followed by   NOREEN ON 07/25/2024] lamoTRIgine   25 mg Oral BID   levETIRAcetam   500 mg Intravenous Q12H   lidocaine   1 patch Transdermal Q24H   oxyCODONE   30 mg Per NG tube Q6H   pyridOXINE   25 mg Oral Daily   sertraline   100 mg Per NG tube QHS   sodium chloride  flush  10-40 mL Intracatheter Q12H   sodium chloride  HYPERTONIC  4 mL Nebulization BID   thiamine   100 mg Per NG tube Daily    Continuous Infusions:  feeding supplement (OSMOLITE 1.5 CAL) 1,000 mL (07/12/24 0409)    PRN Medications:  acetaminophen , artificial tears, hydrALAZINE , hydrOXYzine , sodium chloride  flush  Antimicrobials from admission:  Anti-infectives (From admission, onward)    Start     Dose/Rate Route Frequency Ordered Stop   07/08/24 1000  cefTRIAXone  (ROCEPHIN ) 2 g in sodium chloride  0.9 % 100 mL IVPB        2 g 200 mL/hr over 30 Minutes Intravenous Every 24  hours 07/05/24 1128 07/10/24 0946   07/01/24 1600  ceFEPIme  (MAXIPIME ) 2 g in sodium chloride  0.9 % 100 mL IVPB        2 g 200 mL/hr over 30 Minutes Intravenous Every 8 hours 07/01/24 1429 07/07/24 1839   07/01/24 1515  linezolid  (ZYVOX ) IVPB 600 mg        600 mg 300 mL/hr over 60 Minutes Intravenous Every 12 hours 07/01/24 1423 07/07/24 2230   06/28/24 1600  cefTRIAXone  (ROCEPHIN ) 2 g in sodium chloride  0.9 % 100 mL IVPB  Status:  Discontinued        2 g 200 mL/hr over 30 Minutes Intravenous Every 24 hours 06/28/24 1104 07/01/24 1423   06/28/24 0600  linezolid  (ZYVOX ) IVPB 600 mg  Status:  Discontinued        600 mg 300 mL/hr over 60 Minutes Intravenous Every 12 hours 06/27/24 2117 06/28/24 1102   06/28/24 0200  piperacillin -tazobactam (ZOSYN ) IVPB 3.375 g  Status:  Discontinued        3.375 g 12.5 mL/hr over 240 Minutes Intravenous Every 8 hours 06/27/24 2322 06/28/24 1104   06/27/24 1500  azithromycin  (ZITHROMAX ) 500 mg in sodium chloride  0.9 % 250 mL IVPB        500 mg 250 mL/hr over 60 Minutes Intravenous Every 24 hours 06/27/24 1403 07/02/24 2359   06/27/24 1415  linezolid  (ZYVOX ) IVPB 600 mg  Status:  Discontinued        600 mg 300 mL/hr over 60 Minutes Intravenous Every 12 hours 06/27/24 1403 06/27/24 2117   06/27/24 1400  piperacillin -tazobactam (ZOSYN ) IVPB 3.375 g  Status:  Discontinued        3.375 g 12.5 mL/hr over 240 Minutes Intravenous Every 8 hours 06/27/24 1321 06/27/24 2322   06/27/24 1115  azithromycin  (ZITHROMAX ) 500 mg in sodium chloride  0.9 % 250 mL IVPB  Status:  Discontinued        500 mg 250 mL/hr over 60 Minutes Intravenous  Once 06/27/24 1102 06/27/24 1408   06/27/24 1115  cefTRIAXone  (ROCEPHIN ) 1 g in sodium chloride  0.9 % 100 mL IVPB        1 g 200 mL/hr over 30 Minutes Intravenous  Once 06/27/24 1102 06/27/24 1200           Data Reviewed:  I have personally reviewed the following...  CBC: Recent Labs  Lab 07/07/24 0502 07/08/24 0449  07/09/24 0420 07/10/24 0430 07/11/24 0331  WBC 19.0* 12.1* 13.2* 16.6* 13.9*  HGB 8.0* 7.9* 8.7* 9.6* 9.2*  HCT 25.3* 24.3* 27.7* 30.5* 29.5*  MCV 94.4 94.6 96.2 97.4 97.0  PLT 425* 491* 663* 885* 776*   Basic Metabolic Panel: Recent Labs  Lab 07/07/24 0502 07/07/24 1259 07/08/24 0449 07/09/24 0420 07/10/24 0031 07/10/24 0430 07/10/24 1652 07/11/24 0331 07/11/24 1807  NA 141   < > 141 141 146* 149* 138 147* 146*  K 3.8   < > 3.3* 3.8 3.6 3.6 3.3* 3.4* 3.7  CL 104   < > 102 103 105 106 101 110 109  CO2 28   < > 29 28 23 23  20* 27 26  GLUCOSE 87   < > 95 108* 112* 101* 373* 129* 132*  BUN 23*   < > 21* 22* 21* 23* 20 19 17   CREATININE 0.31*   < > 0.32* 0.39* 0.41* 0.40* 0.36* 0.37* 0.40*  CALCIUM  9.1   < > 9.1 9.3 9.6 9.7 8.7* 9.8 9.7  MG 1.9  --  1.7  --  1.7 1.7  --  1.9  --   PHOS 3.9  --  4.2 5.0*  --  3.7  --  3.8  --    < > = values in this interval not displayed.   GFR: Estimated Creatinine Clearance: 84.5 mL/min (A) (by C-G formula based on SCr of 0.4 mg/dL (L)). Liver Function Tests: Recent Labs  Lab 07/07/24 0502 07/08/24 0449 07/09/24 0420 07/10/24 0430 07/11/24 0331  ALBUMIN 2.7* 2.8* 3.3* 3.5 3.5   No results for input(s): LIPASE, AMYLASE in the last 168 hours. No results for input(s): AMMONIA in the last 168 hours. Coagulation Profile: No results for input(s): INR, PROTIME in the last 168 hours. Cardiac Enzymes: No results for input(s): CKTOTAL, CKMB, CKMBINDEX, TROPONINI in the last 168 hours. BNP (last 3 results) No results for input(s): PROBNP in the last 8760 hours. HbA1C: No results for input(s): HGBA1C in the last 72 hours. CBG: Recent Labs  Lab 07/12/24 0002 07/12/24 0408 07/12/24 0728 07/12/24 1151 07/12/24 1600  GLUCAP 124* 132* 152* 92 115*   Lipid Profile: Recent Labs    07/10/24 0430  TRIG 149   Thyroid  Function Tests: No results for input(s): TSH, T4TOTAL, FREET4, T3FREE, THYROIDAB in the  last 72 hours. Anemia Panel: No results for input(s): VITAMINB12, FOLATE, FERRITIN, TIBC, IRON, RETICCTPCT in the last 72 hours. Most Recent Urinalysis On File:     Component Value Date/Time   COLORURINE YELLOW (A) 06/27/2024 2117   APPEARANCEUR HAZY (A) 06/27/2024 2117   APPEARANCEUR Hazy 10/05/2013 0808   LABSPEC 1.013 06/27/2024 2117   LABSPEC 1.006 10/05/2013 0808   PHURINE 5.0 06/27/2024 2117   GLUCOSEU NEGATIVE 06/27/2024 2117   GLUCOSEU Negative 10/05/2013 0808   HGBUR NEGATIVE 06/27/2024 2117   BILIRUBINUR NEGATIVE 06/27/2024 2117   BILIRUBINUR neg 05/17/2014 1208   BILIRUBINUR Negative 10/05/2013 0808   KETONESUR NEGATIVE 06/27/2024 2117   PROTEINUR 30 (A) 06/27/2024 2117   UROBILINOGEN 0.2 05/17/2014 1208   NITRITE NEGATIVE 06/27/2024 2117   LEUKOCYTESUR NEGATIVE 06/27/2024 2117   LEUKOCYTESUR Negative 10/05/2013 0808   Sepsis Labs: @LABRCNTIP (procalcitonin:4,lacticidven:4) Microbiology: Recent Results (from the past 240 hours)  MRSA Next Gen by PCR, Nasal     Status: None   Collection Time: 07/10/24  9:33 AM   Specimen: Anterior Nasal  Swab  Result Value Ref Range Status   MRSA by PCR Next Gen NOT DETECTED NOT DETECTED Final    Comment: (NOTE) The GeneXpert MRSA Assay (FDA approved for NASAL specimens only), is one component of a comprehensive MRSA colonization surveillance program. It is not intended to diagnose MRSA infection nor to guide or monitor treatment for MRSA infections. Test performance is not FDA approved in patients less than 44 years old. Performed at [facility], [address] [place], [place] [address]   Respiratory (~20 pathogens) panel by PCR     Status: None   Collection Time: 07/10/24  9:34 AM   Specimen: Nasopharyngeal Swab; Respiratory  Result Value Ref Range Status   Adenovirus NOT DETECTED NOT DETECTED Final   Coronavirus 229E NOT DETECTED NOT DETECTED Final    Comment: (NOTE) The Coronavirus on the  Respiratory Panel, DOES NOT test for the novel  Coronavirus (2019 nCoV)    Coronavirus HKU1 NOT DETECTED NOT DETECTED Final   Coronavirus NL63 NOT DETECTED NOT DETECTED Final   Coronavirus OC43 NOT DETECTED NOT DETECTED Final   Metapneumovirus NOT DETECTED NOT DETECTED Final   Rhinovirus / Enterovirus NOT DETECTED NOT DETECTED Final   Influenza A NOT DETECTED NOT DETECTED Final   Influenza B NOT DETECTED NOT DETECTED Final   Parainfluenza Virus 1 NOT DETECTED NOT DETECTED Final   Parainfluenza Virus 2 NOT DETECTED NOT DETECTED Final   Parainfluenza Virus 3 NOT DETECTED NOT DETECTED Final   Parainfluenza Virus 4 NOT DETECTED NOT DETECTED Final   Respiratory Syncytial Virus NOT DETECTED NOT DETECTED Final   Bordetella pertussis NOT DETECTED NOT DETECTED Final   Bordetella Parapertussis NOT DETECTED NOT DETECTED Final   Chlamydophila pneumoniae NOT DETECTED NOT DETECTED Final   Mycoplasma pneumoniae NOT DETECTED NOT DETECTED Final    Comment: Performed at [facility], [address] [place], [place] [address]  Resp panel by RT-PCR (RSV, Flu A&B, Covid) Anterior Nasal Swab     Status: None   Collection Time: 07/10/24  9:34 AM   Specimen: Anterior Nasal Swab  Result Value Ref Range Status   SARS Coronavirus 2 by RT PCR NEGATIVE NEGATIVE Final    Comment: (NOTE) SARS-CoV-2 target nucleic acids are NOT DETECTED.  The SARS-CoV-2 RNA is generally detectable in upper respiratory specimens during the acute phase of infection. The lowest concentration of SARS-CoV-2 viral copies this assay can detect is 138 copies/mL. A negative result does not preclude SARS-Cov-2 infection and should not be used as the sole basis for treatment or other patient management decisions. A negative result may occur with  improper specimen collection/handling, submission of specimen other than nasopharyngeal swab, presence of viral mutation(s) within the areas targeted by this assay, and inadequate number of  viral copies(<138 copies/mL). A negative result must be combined with clinical observations, patient history, and epidemiological information. The expected result is Negative.  Fact Sheet for Patients:  bloggercourse.com  Fact Sheet for Healthcare Providers:  seriousbroker.it  This test is no t yet approved or cleared by the [place]  FDA and  has been authorized for detection and/or diagnosis of SARS-CoV-2 by FDA under an Emergency Use Authorization (EUA). This EUA will remain  in effect (meaning this test can be used) for the duration of the COVID-19 declaration under Section 564(b)(1) of the Act, 21 U.S.C.section 360bbb-3(b)(1), unless the authorization is terminated  or revoked sooner.       Influenza A by PCR NEGATIVE NEGATIVE Final   Influenza  B by PCR NEGATIVE NEGATIVE Final    Comment: (NOTE) The Xpert Xpress SARS-CoV-2/FLU/RSV plus assay is intended as an aid in the diagnosis of influenza from Nasopharyngeal swab specimens and should not be used as a sole basis for treatment. Nasal washings and aspirates are unacceptable for Xpert Xpress SARS-CoV-2/FLU/RSV testing.  Fact Sheet for Patients: bloggercourse.com  Fact Sheet for Healthcare Providers: seriousbroker.it  This test is not yet approved or cleared by the [place]  FDA and has been authorized for detection and/or diagnosis of SARS-CoV-2 by FDA under an Emergency Use Authorization (EUA). This EUA will remain in effect (meaning this test can be used) for the duration of the COVID-19 declaration under Section 564(b)(1) of the Act, 21 U.S.C. section 360bbb-3(b)(1), unless the authorization is terminated or revoked.     Resp Syncytial Virus by PCR NEGATIVE NEGATIVE Final    Comment: (NOTE) Fact Sheet for Patients: bloggercourse.com  Fact Sheet for Healthcare  Providers: seriousbroker.it  This test is not yet approved or cleared by the [place]  FDA and has been authorized for detection and/or diagnosis of SARS-CoV-2 by FDA under an Emergency Use Authorization (EUA). This EUA will remain in effect (meaning this test can be used) for the duration of the COVID-19 declaration under Section 564(b)(1) of the Act, 21 U.S.C. section 360bbb-3(b)(1), unless the authorization is terminated or revoked.  Performed at [facility], [address] [place], [place] [address]       Radiology Studies last 3 days: DG Abd 1 View Result Date: 07/10/2024 CLINICAL DATA:  Feeding tube placement. EXAM: ABDOMEN - 1 VIEW COMPARISON:  06/27/2024 FINDINGS: Tip of the weighted enteric tube is in the left upper abdomen in the region of the gastric body. No bowel dilatation in the included abdomen. Right upper quadrant surgical clips. IMPRESSION: Tip of the weighted enteric tube in the left upper abdomen in the region of the gastric body. Electronically Signed   By: Andrea Gasman M.D.   On: 07/10/2024 17:27   Rapid EEG Result Date: 07/10/2024 Shelton Arlin KIDD, MD     07/10/2024  8:31 PM Patient Name: [name] Phillips MRN: [id number] Epilepsy Attending: Arlin KIDD Shelton Referring Physician/Provider: Kathrene Almarie Bake, NP Duration: 07/10/2024 9391 to 1002 Patient history: 43 yo F with sudden onset episodes of unresponsiveness that lasted 30 seconds, one observed by me. Patient unresponsive and was confused after. EEG to evaluate for seizure Level of alertness: Awake AEDs during EEG study: LEV, Ativan  Technical aspects: This EEG was obtained using a 10 lead EEG system positioned circumferentially without any parasagittal coverage (rapid EEG). Computer selected EEG is reviewed as  well as background features and all clinically significant events. Description: The posterior dominant rhythm consists of 10 Hz activity of moderate voltage  (25-35 uV) seen predominantly in posterior head regions, symmetric and reactive to eye opening and eye closing. There is an excessive amount of 13-15 Hz distributed symmetrically and diffusely. Hyperventilation and photic stimulation were not performed.   ABNORMALITY - Excessive beta, generalized IMPRESSION: This limited ceribell eeg is within normal limits. No seizures or epileptiform discharges were seen throughout the recording. A normal interictal EEG does not exclude the diagnosis of epilepsy. Arlin KIDD Shelton   DG Chest Port 1 View Result Date: 07/09/2024 EXAM: 1 VIEW(S) XRAY OF THE CHEST 07/09/2024 07:13:00 PM COMPARISON: 07/07/2024 CLINICAL HISTORY: Respiratory failure with hypoxia (HCC) FINDINGS: LINES, TUBES AND DEVICES: Right PICC tip in right atrium. Endotracheal tube and enteric tube removed. LUNGS AND PLEURA: Interval  extubation. Progressively low lung volumes with bronchovascular crowding. Improving peripheral right mid lung zone consolidation. No pleural effusion. No pneumothorax. HEART AND MEDIASTINUM: No acute abnormality of the cardiac and mediastinal silhouettes. BONES AND SOFT TISSUES: Partially imaged cervical fixation hardware noted. No acute osseous abnormality. IMPRESSION: 1. Improving peripheral right mid lung zone consolidation. 2. Progressively low lung volumes with bronchovascular crowding. 3. Right PICC tip in the right atrium. Electronically signed by: Dorethia Molt MD 07/09/2024 09:15 PM EST RP Workstation: [id number]           Laneta Blunt, DO [facility] 07/12/2024, 6:37 PM    Dictation software may have been used to generate the above note. Typos may occur and escape review in typed/dictated notes. Please contact Dr Blunt directly for clarity if needed.  Staff may message me via secure chat in Epic  but this may not receive an immediate response,  please page me for urgent matters!  If 7PM-7AM, please contact night  coverage [email or web address]       "

## 2024-07-12 NOTE — Progress Notes (Signed)
 Speech Language Pathology Treatment: Dysphagia  Patient Details Name: [name] Phillips MRN: [id number] DOB: [birth year] Today's Date: 07/12/2024 Time: 9054-8994 SLP Time Calculation (min) (ACUTE ONLY): 20 min  Assessment / Plan / Recommendation Clinical Impression  Pt seen for follow up dysphagia intervention. Noted increased vocal quality and cough strength, with reduced O2 requirement this date (3L). Pt more engaged with therapist, conversant and responsive to education. Pt aware of current restriction to ice chips. Trials completed of ice chips with intermittent wet vocal quality, though no overt coughing. Oral phase timely and efficient. Pt feeding self trials- another improvement from previous session. Given overall gains and continued s/sx of aspiration, recommend completion of MBSS to assess pharyngeal function. RN aware of plan. In the interim recommend continued NPO with allowance of ice chips following oral care. SLP will follow up with instrumental assessment.    HPI HPI: Pt is a 43 y/o female with h/o anxiety, HTN, MDD, HCV, hepatic steatosis, HLD, GERD, seizure disorder, Polysubstance abuse, recent use of fentanyl  and heroin 48 hours ago, who presents to the hospital with progressively worsening short of breath, hypoxia.  Patient states that she had upper respiratory infection about a week ago, had a high fever on the first day of infection.  Since then, she has progressively worsening shortness of breath w/ hypoxia.  She also has a cough with large amount of yellow mucus.   She admitted with bacterial PNA, septic shock, bactermia and AKI.  She was placed on HFNC O2 support at admit, then orally intubated d/t further pulmonary decline on 12/14.  She extubated on 12/25.   Chest imaging at admit: Patchy airspace disease and consolidation in the lungs  bilaterally. There is dense consolidation of the right upper and  lower lobes, concerning for pneumonia.  2. Trace to small right pleural  effusion.  This has improve per recent imaging.      SLP Plan  Continue with current plan of care        Swallow Evaluation Recommendations   Recommendations: NPO;Alternative means of nutrition - NG Tube Medication Administration: Via alternative means Oral care recommendations: Oral care before ice chips/water      Recommendations                     Oral care QID;Oral care prior to ice chip/H20;Staff/trained caregiver to provide oral care   Frequent or constant Supervision/Assistance Dysphagia, oropharyngeal phase (R13.12)     Continue with current plan of care    Stevi Hollinshead Clapp, MS, [name] Speech Language Pathologist Rehab Services; [facility] [phone] (ascom)   Pari Lombard J Clapp  07/12/2024, 10:59 AM

## 2024-07-13 ENCOUNTER — Inpatient Hospital Stay

## 2024-07-13 DIAGNOSIS — J9601 Acute respiratory failure with hypoxia: Secondary | ICD-10-CM | POA: Diagnosis not present

## 2024-07-13 LAB — CBC
HCT: 30.1 % — ABNORMAL LOW (ref 36.0–46.0)
Hemoglobin: 9.9 g/dL — ABNORMAL LOW (ref 12.0–15.0)
MCH: 30.7 pg (ref 26.0–34.0)
MCHC: 32.9 g/dL (ref 30.0–36.0)
MCV: 93.5 fL (ref 80.0–100.0)
Platelets: 541 K/uL — ABNORMAL HIGH (ref 150–400)
RBC: 3.22 MIL/uL — ABNORMAL LOW (ref 3.87–5.11)
RDW: 14.9 % (ref 11.5–15.5)
WBC: 8.1 K/uL (ref 4.0–10.5)
nRBC: 0 % (ref 0.0–0.2)

## 2024-07-13 LAB — GLUCOSE, CAPILLARY
Glucose-Capillary: 119 mg/dL — ABNORMAL HIGH (ref 70–99)
Glucose-Capillary: 125 mg/dL — ABNORMAL HIGH (ref 70–99)
Glucose-Capillary: 146 mg/dL — ABNORMAL HIGH (ref 70–99)
Glucose-Capillary: 160 mg/dL — ABNORMAL HIGH (ref 70–99)

## 2024-07-13 LAB — PHOSPHORUS: Phosphorus: 4 mg/dL (ref 2.5–4.6)

## 2024-07-13 LAB — BASIC METABOLIC PANEL WITH GFR
Anion gap: 11 (ref 5–15)
BUN: 15 mg/dL (ref 6–20)
CO2: 28 mmol/L (ref 22–32)
Calcium: 9.7 mg/dL (ref 8.9–10.3)
Chloride: 102 mmol/L (ref 98–111)
Creatinine, Ser: 0.45 mg/dL (ref 0.44–1.00)
GFR, Estimated: >60 mL/min
Glucose, Bld: 104 mg/dL — ABNORMAL HIGH (ref 70–99)
Potassium: 4.2 mmol/L (ref 3.5–5.1)
Sodium: 141 mmol/L (ref 135–145)

## 2024-07-13 LAB — MAGNESIUM: Magnesium: 1.8 mg/dL (ref 1.7–2.4)

## 2024-07-13 MED ORDER — ALUM & MAG HYDROXIDE-SIMETH 200-200-20 MG/5ML PO SUSP
30.0000 mL | ORAL | Status: DC | PRN
  Administered 2024-07-13 – 2024-07-15 (×6): 30 mL via ORAL
  Filled 2024-07-13 (×6): qty 30

## 2024-07-13 MED ORDER — VITAMIN D 25 MCG (1000 UNIT) PO TABS
2000.0000 [IU] | ORAL_TABLET | Freq: Every day | ORAL | Status: DC
  Administered 2024-07-14 – 2024-07-15 (×2): 2000 [IU] via ORAL
  Filled 2024-07-13 (×2): qty 2

## 2024-07-13 MED ORDER — FOLIC ACID 1 MG PO TABS
1.0000 mg | ORAL_TABLET | Freq: Every day | ORAL | Status: DC
  Administered 2024-07-14 – 2024-07-15 (×2): 1 mg via ORAL
  Filled 2024-07-13 (×2): qty 1

## 2024-07-13 MED ORDER — IPRATROPIUM-ALBUTEROL 0.5-2.5 (3) MG/3ML IN SOLN: 3.0000 mL | Freq: Four times a day (QID) | RESPIRATORY_TRACT | Status: DC | PRN

## 2024-07-13 MED ORDER — SERTRALINE HCL 50 MG PO TABS
100.0000 mg | ORAL_TABLET | Freq: Every day | ORAL | Status: DC
  Administered 2024-07-13 – 2024-07-14 (×2): 100 mg via ORAL
  Filled 2024-07-13 (×2): qty 2

## 2024-07-13 MED ORDER — OXYCODONE HCL 5 MG PO TABS
30.0000 mg | ORAL_TABLET | Freq: Four times a day (QID) | ORAL | Status: DC
  Administered 2024-07-13 – 2024-07-15 (×9): 30 mg via ORAL
  Filled 2024-07-13 (×9): qty 6

## 2024-07-13 MED ORDER — HYDROXYZINE HCL 25 MG PO TABS
25.0000 mg | ORAL_TABLET | Freq: Three times a day (TID) | ORAL | Status: DC | PRN
  Administered 2024-07-13 – 2024-07-14 (×4): 25 mg via ORAL
  Filled 2024-07-13 (×5): qty 1

## 2024-07-13 MED ORDER — THIAMINE HCL 100 MG PO TABS
100.0000 mg | ORAL_TABLET | Freq: Every day | ORAL | Status: DC
  Administered 2024-07-14 – 2024-07-15 (×2): 100 mg via ORAL
  Filled 2024-07-13 (×4): qty 1

## 2024-07-13 MED ORDER — LEVETIRACETAM ER 500 MG PO TB24
500.0000 mg | ORAL_TABLET | Freq: Two times a day (BID) | ORAL | Status: DC
  Administered 2024-07-13 – 2024-07-15 (×4): 500 mg via ORAL
  Filled 2024-07-13 (×5): qty 1

## 2024-07-13 MED ORDER — HYDROMORPHONE HCL 1 MG/ML IJ SOLN
0.5000 mg | INTRAMUSCULAR | Status: DC | PRN
  Administered 2024-07-13 – 2024-07-15 (×8): 0.5 mg via INTRAVENOUS
  Filled 2024-07-13 (×8): qty 0.5

## 2024-07-13 MED ORDER — ATORVASTATIN CALCIUM 10 MG PO TABS
10.0000 mg | ORAL_TABLET | Freq: Every day | ORAL | Status: DC
  Administered 2024-07-14 – 2024-07-15 (×2): 10 mg via ORAL
  Filled 2024-07-13 (×2): qty 1

## 2024-07-13 NOTE — TOC Progression Note (Signed)
 Transition of Care [facility]) - Progression Note    Patient Details  Name: [name] Phillips MRN: [id number] Date of Birth: 04-10-1981  Transition of Care [facility]) CM/SW Contact  Tonia Avino  [place], [place] Phone Number: 07/13/2024, 12:34 PM  Clinical Narrative:   LCSWA spoke to the spouse and explained the differences in CIR, SNF, OP rehab, and HH. The spouse indicated that he wants to talk to the patient to decide what the next steps will be. He understood the different options that she has to pick from and he will call me back once he speaks with her at the bedside.   TOC to follow for discharge                      Expected Discharge Plan and Services                                               Social Drivers of Health (SDOH) Interventions SDOH Screenings   Food Insecurity: No Food Insecurity (06/27/2024)  Housing: Low Risk (06/27/2024)  Transportation Needs: No Transportation Needs (06/27/2024)  Utilities: Not At Risk (06/27/2024)  Depression (PHQ2-9): High Risk (10/29/2023)  Tobacco Use: Medium Risk (06/27/2024)    Readmission Risk Interventions    07/06/2024    3:19 PM  Readmission Risk Prevention Plan  Transportation Screening Complete  PCP or Specialist Appt within 3-5 Days Complete  HRI or Home Care Consult Complete  Social Work Consult for Recovery Care Planning/Counseling Complete  Palliative Care Screening Not Applicable  Medication Review Oceanographer) Complete

## 2024-07-13 NOTE — Progress Notes (Addendum)
 Modified Barium Swallow Study  Patient Details  Name: [name] Phillips MRN: [id number] Date of Birth: [birth year]  Today's Date: 07/13/2024  Modified Barium Swallow completed.  Full report located under Chart Review in the Imaging Section.  History of Present Illness  Pt is a 43 y/o female with h/o anxiety, HTN, MDD, HCV, hepatic steatosis, HLD, GERD, seizure disorder, Polysubstance abuse, recent use of fentanyl  and heroin 48 hours ago, who presents to the hospital with progressively worsening short of breath, hypoxia.  Patient states that she had upper respiratory infection about a week ago, had a high fever on the first day of infection.  Since then, she has progressively worsening shortness of breath w/ hypoxia.  She also has a cough with large amount of yellow mucus.   She admitted with bacterial PNA, septic shock, bactermia and AKI.  She was placed on HFNC O2 support at admit, then orally intubated d/t further pulmonary decline on 12/14.  She extubated on 12/25.   Chest imaging at admit: Patchy airspace disease and consolidation in the lungs  bilaterally. There is dense consolidation of the right upper and  lower lobes, concerning for pneumonia.  2. Trace to small right pleural effusion.  This has improve per recent imaging.  NGT placed 07/09/24 to support nutrition/hydration.   Clinical Impression: Clinical Impression: Patient presents with functional oropharyngeal swallowing w/ no overt oropharyngeal phase dysphagia noted during study.  No aspiration nor laryngeal penetration noted to occur.   Oral phase is characterized by adequate lip closure, bolus preparation and containment, mastication, and anterior to posterior transit. Swallow initiation occurs primarily at theBOT>valleculae; age-appropriate.   Pharyngeal phase is noted for adequate tongue base retraction, adequate hyolaryngeal excursion, and adequate pharyngeal constriction. Pharyngeal stripping wave is complete. Epiglottic inversion  is complete w/ trials during the study. Timely/tight epiglottic inversion/closure occurred during challenge of multiple sips via straw (d/t NGT presence and distraction). No aspiration nor laryngeal penetration occurred during the study. No overt pharyngeal residue remained post initial swallow w/ boluses presented.  Amplitude/duration of cricopharyngeus opening appeared [facility]. There was adequate/complete clearance through the upper cervical Esophagus. An Esophageal sweep was not performed. A 13 mm barium tablet was not given during this study. Presence of the NGT noted.  Pt was educated on the results of the study immediately after; video viewed and questions answered.   Factors that may increase risk of adverse event in presence of aspiration [name] Phillips & [name] Phillips 2021): Factors that may increase risk of adverse event in presence of aspiration [name] Phillips & [name] Phillips 2021): recent oral intubation/extubation during hospitalization     Recommendations/Plan: Swallowing Evaluation Recommendations  Recommendations: PO diet PO Diet Recommendation: Regular; Thin liquids (Level 0) Liquid Administration via: Cup; Straw Medication Administration: Whole meds with liquid (vs Whole in Puree if easier) Supervision: Patient able to self-feed; Set-up assistance for safety (d/t overall weakness) Swallowing strategies: Minimize environmental distractions/talking during meals/oral intake; Slow rate; Small bites/sips Postural changes: Position pt fully upright for meals; Stay upright 30-60 min after meals; Out of bed for meals Oral care recommendations: Oral care BID (2x/day); Pt independent with oral care (setup) Recommended consults: Consider dietitian consultation                 [name] [name], MS, [name] Speech Language Pathologist Rehab Services; [name] [phone] (ascom) [name] Phillips 07/13/2024,3:10 PM

## 2024-07-13 NOTE — Progress Notes (Signed)
" ° °  Inpatient Rehabilitation Admissions Coordinator   I left a voicemail for spouse to call me to discuss his preference for rehab venue.  Heron Leavell, RN, MSN Rehab Admissions Coordinator [phone] 07/13/2024 4:33 PM  "

## 2024-07-13 NOTE — Clinical Note (Incomplete)
 Lamotrigine  Morning Evening  Week 1 25mg    Week 2 25mg  25mg   Week 3 50mg  25mg   Week 4 50mg  50mg   Week 5 75mg  50mg   Week 6 75mg  75mg   Week 7 100mg  75mg   Week 8 100mg  100mg    Week 1 started

## 2024-07-13 NOTE — Progress Notes (Signed)
 Physical Therapy Treatment Patient Details Name: [name] Phillips MRN: [id number] DOB: 1981/04/23 Today's Date: 07/13/2024   History of Present Illness Patient is a 43 year old female with acute hypoxic respiratory failure, severe strep pneumo pneumonia, strep pneumo bacteremia, septic shock. History of polysubstance and IV drug use, on suboxone  prior to presentation.  Extubated 07/08/24    PT Comments  Patient received in bed, friend at bedside. She initially asks for us  to come back, but is agreeable to PT/OT with encouragement. Patient requires repeated cues for initiation and processing of mobility. Internally distracted. Poor awareness at times and is a high fall risk. She demonstrates good sitting balance once at edge of bed. Patient requires cues for hand placement for sit to stand transfers and will sit unexpectedly after minimal time standing. HR up to 140s at max with standing. She was able to take a few side steps with RW and min A. Patient demonstrates poor activity tolerance and weakness. She required 4 liters of O2 during session to maintain O2 sats > 90%. Patient will continue to benefit from skilled PT to improve endurance, strength and safety with mobility.      If plan is discharge home, recommend the following: A lot of help with walking and/or transfers;A little help with bathing/dressing/bathroom   Can travel by private vehicle      no  Equipment Recommendations  Other (comment) (TBD)    Recommendations for Other Services Rehab consult     Precautions / Restrictions Precautions Precautions: Fall Recall of Precautions/Restrictions: Impaired Restrictions Weight Bearing Restrictions Per Provider Order: No     Mobility  Bed Mobility Overal bed mobility: Modified Independent Bed Mobility: Supine to Sit, Sit to Supine     Supine to sit: Modified independent (Device/Increase time) Sit to supine: Modified independent (Device/Increase time)   General bed mobility  comments: increased time and repeated cues needed to complete task.    Transfers Overall transfer level: Needs assistance Equipment used: Rolling walker (2 wheels) Transfers: Sit to/from Stand Sit to Stand: Supervision, Contact guard assist           General transfer comment: cues for hand placement each time. Supervision to CGA needed for safety    Ambulation/Gait Ambulation/Gait assistance: Contact guard assist, Min assist Gait Distance (Feet): 4 Feet Assistive device: Rolling walker (2 wheels) Gait Pattern/deviations: Step-to pattern, Decreased step length - right, Decreased step length - left, Decreased stride length Gait velocity: decr     General Gait Details: side steps at edge of bed with RW. She will sit unexpectedly due to fatigue/dizziness. BP taken in sitting and standing with the following reading: 147/99, 129/88 respectively.   Stairs             Wheelchair Mobility     Tilt Bed    Modified Rankin (Stroke Patients Only)       Balance               Standing balance comment: weakness, dizziness in standing. Poor tolerance.                            Communication Communication Communication: No apparent difficulties  Cognition Arousal: Alert Behavior During Therapy: WFL for tasks assessed/performed   PT - Cognitive impairments: Problem solving, Sequencing, Initiation                       PT - Cognition Comments: Patient requires repeated cues  at times for processing, sequencing. Following commands: Impaired Following commands impaired: Follows one step commands inconsistently, Follows one step commands with increased time    Cueing Cueing Techniques: Verbal cues  Exercises      General Comments General comments (skin integrity, edema, etc.): Pt found on RA, SpO2 81-83% HR 110's, required 5L to maintain over 90% with standing trials. HR up to 149 with standing on 3rd attempt. BP dropped wiht sit to stand with pt  endorsing symptoms. RN, MD notified.      Pertinent Vitals/Pain      Home Living                          Prior Function            PT Goals (current goals can now be found in the care plan section) Acute Rehab PT Goals Patient Stated Goal: to be able to get up and go home PT Goal Formulation: With patient Time For Goal Achievement: 07/21/24 Potential to Achieve Goals: Good Progress towards PT goals: Progressing toward goals    Frequency    Min 3X/week      PT Plan      Co-evaluation PT/OT/SLP Co-Evaluation/Treatment: Yes Reason for Co-Treatment: For patient/therapist safety PT goals addressed during session: Mobility/safety with mobility        AM-PAC PT 6 Clicks Mobility   Outcome Measure  Help needed turning from your back to your side while in a flat bed without using bedrails?: A Little Help needed moving from lying on your back to sitting on the side of a flat bed without using bedrails?: A Little Help needed moving to and from a bed to a chair (including a wheelchair)?: A Little Help needed standing up from a chair using your arms (e.g., wheelchair or bedside chair)?: A Little Help needed to walk in hospital room?: Total Help needed climbing 3-5 steps with a railing? : Total 6 Click Score: 14    End of Session Equipment Utilized During Treatment: Gait belt;Oxygen Activity Tolerance: Patient limited by fatigue Patient left: in bed;with call bell/phone within reach;with bed alarm set Nurse Communication: Mobility status;Other (comment) (O2 needs) PT Visit Diagnosis: Other abnormalities of gait and mobility (R26.89);Muscle weakness (generalized) (M62.81);Difficulty in walking, not elsewhere classified (R26.2);Unsteadiness on feet (R26.81);Dizziness and giddiness (R42)     Time: 8641-8576 PT Time Calculation (min) (ACUTE ONLY): 25 min  Charges:    $Therapeutic Activity: 8-22 mins PT General Charges $$ ACUTE PT VISIT: 1 Visit                      Phong Isenberg, PT, GCS 07/13/2024,2:53 PM

## 2024-07-13 NOTE — Progress Notes (Signed)
 Occupational Therapy Treatment Patient Details Name: [name] Phillips MRN: [id number] DOB: 1981-01-13 Today's Date: 07/13/2024   History of present illness Patient is a 43 year old female with acute hypoxic respiratory failure, severe strep pneumo pneumonia, strep pneumo bacteremia, septic shock. History of polysubstance and IV drug use, on suboxone  prior to presentation.  Extubated 07/08/24   OT comments  Pt seen for OT and PT co-tx. Pt sleeping lightly, wakes to therapists. Friend present and supportive. Pt found on RA, SpO2 81-83% HR 110's. O2 replaced. (Pt thought nursing removed it but RN later confirmed she did not but had replaced it earlier after finding it off). Pt required several VC to initiate and complete sup>sit EOB, pt demonstrating difficulty with dual tasking. Pt ultimately required 5L to maintain over 90% with standing trials x3. HR up to 149 with standing on 3rd attempt. Pt endorsed symptoms with standing 2nd time. BP assessed in sitting and standing for 3rd attempt with pt endorsing symptoms again and feeling as though she might pass out. RN, MD notified. Pt edu in need for O2, role of acute therapy and current recommendations. Pt in agreement that she is not safe to return home at this time based on functional performance. Agreeable to further therapy. Will continue to benefit from high intensity skilled OT services (>3hrs/day). 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       If plan is discharge home, recommend the following:  A lot of help with walking and/or transfers;A lot of help with bathing/dressing/bathroom;Assistance with cooking/housework;Assist for transportation;Direct supervision/assist for medications management;Supervision due to cognitive status;Direct supervision/assist for financial management;Help with stairs or ramp for entrance   Equipment Recommendations  Other (comment) (defer)    Recommendations for Other  Services Rehab consult    Precautions / Restrictions Precautions Precautions: Fall Recall of Precautions/Restrictions: Impaired Precaution/Restrictions Comments: watch O2, HR, BP Restrictions Weight Bearing Restrictions Per Provider Order: No       Mobility Bed Mobility Overal bed mobility: Needs Assistance Bed Mobility: Supine to Sit, Sit to Supine       Sit to supine: Supervision   General bed mobility comments: multiple VC to initiate, difficulty with dual tasking    Transfers Overall transfer level: Needs assistance Equipment used: Rolling walker (2 wheels) Transfers: Sit to/from Stand Sit to Stand: Contact guard assist, Min assist, +2 safety/equipment           General transfer comment: VC for hand placement     Balance Overall balance assessment: Needs assistance (Simultaneous filing. User may not have seen previous data.) Sitting-balance support: No upper extremity supported, Feet supported (Simultaneous filing. User may not have seen previous data.) Sitting balance-Leahy Scale: Fair (Simultaneous filing. User may not have seen previous data.)     Standing balance support: Bilateral upper extremity supported, Reliant on assistive device for balance (Simultaneous filing. User may not have seen previous data.) Standing balance-Leahy Scale: Poor (Simultaneous filing. User may not have seen previous data.)                             ADL either performed or assessed with clinical judgement   ADL Overall ADL's : Needs assistance/impaired Eating/Feeding: Modified independent Eating/Feeding Details (indicate cue type and reason): no more feeding tube, passed her MBSS, on regular diet                 Lower Body Dressing: Bed level;Modified independent Lower  Body Dressing Details (indicate cue type and reason): donn socks             Functional mobility during ADLs: Contact guard assist;Minimal assistance;Cueing for sequencing;Rolling  walker (2 wheels)      Extremity/Trunk Assessment              Vision       Perception     Praxis     Communication Communication Communication: No apparent difficulties   Cognition Arousal: Alert Behavior During Therapy: WFL for tasks assessed/performed Cognition: Cognition impaired     Awareness: Online awareness impaired, Intellectual awareness impaired     Executive functioning impairment (select all impairments): Problem solving, Reasoning OT - Cognition Comments: continues to demo some cognitive impairments in processing, safety, problem solving                 Following commands: Impaired Following commands impaired: Follows one step commands inconsistently, Follows one step commands with increased time      Cueing   Cueing Techniques: Verbal cues  Exercises      Shoulder Instructions       General Comments Pt found on RA, SpO2 81-83% HR 110's, required 5L to maintain over 90% with standing trials. HR up to 149 with standing on 3rd attempt. BP dropped wiht sit to stand with pt endorsing symptoms. RN, MD notified.    Pertinent Vitals/ Pain       Pain Assessment Pain Assessment: Faces Faces Pain Scale: Hurts little more Pain Location: back Pain Descriptors / Indicators: Aching, Discomfort, Grimacing, Guarding Pain Intervention(s): Monitored during session, Premedicated before session, Repositioned, Patient requesting pain meds-RN notified  Home Living                                          Prior Functioning/Environment              Frequency  Min 3X/week        Progress Toward Goals  OT Goals(current goals can now be found in the care plan section)  Progress towards OT goals: Progressing toward goals  Acute Rehab OT Goals Patient Stated Goal: get stronger OT Goal Formulation: With patient/family Time For Goal Achievement: 08/04/24 Potential to Achieve Goals: Good  Plan      Co-evaluation    PT/OT/SLP  Co-Evaluation/Treatment: Yes Reason for Co-Treatment: For patient/therapist safety PT goals addressed during session: Mobility/safety with mobility        AM-PAC OT 6 Clicks Daily Activity     Outcome Measure   Help from another person eating meals?: A Little Help from another person taking care of personal grooming?: A Little Help from another person toileting, which includes using toliet, bedpan, or urinal?: A Lot Help from another person bathing (including washing, rinsing, drying)?: A Lot Help from another person to put on and taking off regular upper body clothing?: A Lot Help from another person to put on and taking off regular lower body clothing?: A Lot 6 Click Score: 14    End of Session Equipment Utilized During Treatment: Oxygen;Gait belt;Rolling walker (2 wheels)  OT Visit Diagnosis: Unsteadiness on feet (R26.81);Repeated falls (R29.6);Muscle weakness (generalized) (M62.81)   Activity Tolerance Patient tolerated treatment well   Patient Left in bed;with call bell/phone within reach;with bed alarm set;with family/visitor present;with nursing/sitter in room   Nurse Communication Mobility status;Other (comment);Patient requests pain meds (O2)  Time: 8641-8576 OT Time Calculation (min): 25 min  Charges: OT General Charges $OT Visit: 1 Visit OT Treatments $Therapeutic Activity: 8-22 mins  Warren SAUNDERS., MPH, MS, OTR/L ascom [phone] 07/13/2024, 2:53 PM

## 2024-07-13 NOTE — Progress Notes (Signed)
 PT Cancellation Note  Patient Details Name: [name] Phillips MRN: [id number] DOB: 1980/07/19   Cancelled Treatment:    Reason Eval/Treat Not Completed: Patient declined, no reason specified States a little later, family in room and she would like to visit and requests I come back later. Will re-attempt this pm.   Jaxsen Bernhart 07/13/2024, 10:48 AM

## 2024-07-13 NOTE — Plan of Care (Signed)
   Problem: Education: Goal: Knowledge of General Education information will improve Description: Including pain rating scale, medication(s)/side effects and non-pharmacologic comfort measures Outcome: Progressing   Problem: Health Behavior/Discharge Planning: Goal: Ability to manage health-related needs will improve Outcome: Progressing   Problem: Clinical Measurements: Goal: Ability to maintain clinical measurements within normal limits will improve Outcome: Progressing   Problem: Clinical Measurements: Goal: Respiratory complications will improve Outcome: Progressing

## 2024-07-13 NOTE — Plan of Care (Signed)
   Problem: Coping: Goal: Level of anxiety will decrease Outcome: Progressing   Problem: Elimination: Goal: Will not experience complications related to bowel motility Outcome: Progressing Goal: Will not experience complications related to urinary retention Outcome: Progressing

## 2024-07-13 NOTE — Progress Notes (Signed)
 " PROGRESS NOTE    [name] Phillips   FMW:[id number] DOB: 1980/10/22  DOA: 06/27/2024 Date of Service: 07/13/2024 which is hospital day 16  PCP: Corwin Antu, [facility] course / significant events:   HPI: 43 year old female with history of polysubstance use (IVDU with fentanyl , last injection 4 days ago) who presents with increased shortness of breath and admitted for management of pneumonia. She reports symptom onset around over a week ago with fevers and chills. This improved with symptomatic management, but she then developed increased shortness of breath and cough over the past couple of days prompting presentation to the ED. She reports a cough productive of yellow sputum. She has not had any sick contacts, nor has she been sick like this in the past. Patient reports recent travel in November to [place], [place] but denies any hikes or venturing into the desert. She reports recently staying at a hotel here in [place] . She reports a history of IVDU, having used IV fentanyl  4 days ago, and also reports snorting narcotics and vape use. She gets her needles from an exchange program where she also procures the water  used for injection. Denies any history of endocarditis.  12/14: admit with right sided pneumonia and respiratory failure. CXR with right lung white out, INTUBATED, S/p BRONCH, ART LINE PLACED 12/14: BLOOD CX +STREP PNEUMONIA 12/15: remains on vent 12/16: remains on vent 12/17: remains on vent, severe hypoxia, CVL placed 12/18: severe Hypoxia s/p BRONCH mucoid secretions, worsening CXR, ABX broadened to cover Pseudomonas and MRSA 12/19: severe hypoxia 12/21: attempt sedation wean, diurese 12/22: Remains critically ill requiring mechanical ventilation, levophed  and multiple sedation agents. Diuresed with Lasix  40mg  yesterday; 5.9L UO. Will attempt at [facility] today and wean vent requirements if able 12/23: Remains critically ill requiring mechanical ventilation. Still on  levophed , propofol , dilaudid  and precedex . O2 requirements back to 60%; went up to 100% yesterday d/t ETT progressing into right mainstem and needed retraction. Required versed  early this am for acute agitation when trying to get a bath. Wean vent settings as able. Will hold of on weaning sedation today as she is awake on current sedation requirements 12/24: Remains critically ill requiring mechanical ventilation. Low pressor requirements and decreasing sedation needs. Wean vent settings as able. Awake and able to answer questions appropriately. ENT following for possible trach placement next week. 12/25: Extubated to HHFNC, requiring Precedex  for anxiety/delirium. Diuresed w 4.3L UOP  12/26: Tolerating extubation, wean HHFNC as able.  Wean precedex , clonidine  started  12/27 remains in ICU on ceribell  12/28: improving and transfer to [facility] hospitalist 12/29: SLP following, still oropharyngeal dysphagia, NG still in place and patient n.p.o. otherwise.  Evaluation in process for inpatient rehab.     Consultants:  [facility] Neurology ENT   Procedures/Surgeries:        ASSESSMENT & PLAN:   Respiratory failure secondary to severe pneumonia from S. Pneumo. - resolved Extubated 12/25 to hi-flo --> tapered PT/OT mobilize secretions (hypertonic saline, vest, flutter device).  Swallowing dysfunction - resolved NG out doing well w/ diet po   Septic shock secondary to bacteremia  now resolved.  off vasopressors, abx Monitor   AKI on presentation, improved.  Monitor BMP  history of polysubstance and IV drug use suboxone  prior to presentation.  All sedation and drips are discontinued after extubation.  IV hydromorphone  PRN until we establish PO   Seizure history Appreciate neurology recs - see note 12/28 Continue Keppra  500 mg po BID, may  consider tapering again outpatient after week 8 - defer to outpatient  Lamotrigine  SLOW up-titration as below    Morning  Night  Week 1 and 2   none    25 mg   Week 3 and 4   25 mg    25 mg  Week 5   25 mg   50 mg  Week 6   50 mg   75 mg  Week 7   75 mg 100 mg  Week 8  100 mg 125 mg  add B6 50 mg daily to ameliorate behavioral side effects NO DRIVING Seizure precautions   Prediabetes w hyperglycemia  off TPN and Steroids, starting to have more normoglycemia than hypergly reads  dc basal insulin  and change SSI to moderate from resistant   Urinary retention Foley in place, DC tomorrow for void trial, order is in to remove Foley 07/14/2024 at 6 AM  Debility Await rehab   overweight based on BMI: Body mass index is 26.02 kg/m.SABRA Significantly low or high BMI is associated with higher medical risk.  Underweight - under 18  overweight - 25 to 29 obese - 30 or more Class 1 obesity: BMI of 30.0 to 34 Class 2 obesity: BMI of 35.0 to 39 Class 3 obesity: BMI of 40.0 to 49 Super Morbid Obesity: BMI 50-59 Super-super Morbid Obesity: BMI 60+ Healthy nutrition and physical activity advised as adjunct to other disease management and risk reduction treatments    DVT prophylaxis: lovenox  IV fluids: no continuous IV fluids  Nutrition: diet per SLP Central lines / other devices: none Code Status: FULL CODE ACP documentation reviewed:  none on file in [facility]  [facility] needs: inpatient rehab pending Medical barriers to dispo: none now that NG out and toelrating diet             Subjective / Brief ROS:  Patient reports feeling better today  No other complaints at this time Denies CP/SOB.  Pain controlled.  Denies new weakness. .  Reports no concerns w/ defecation.   Family Communication: none at bedside on rounds    Objective Findings:  Vitals:   07/13/24 0349 07/13/24 0804 07/13/24 1440 07/13/24 1547  BP: 123/62 121/65  137/77  Pulse: 93 100  83  Resp: 18 16  16   Temp: 98.1 F (36.7 C) 98.6 F (37 C)  98.2 F (36.8 C)  TempSrc:      SpO2: 100% 97% (!) 83% 100%  Weight:      Height:        Intake/Output Summary  (Last 24 hours) at 07/13/2024 1803 Last data filed at 07/13/2024 1500 Gross per 24 hour  Intake 2702.5 ml  Output 800 ml  Net 1902.5 ml   Filed Weights   07/12/24 0500  Weight: 68.8 kg    Examination:  Physical Exam Constitutional:      General: She is not in acute distress. Cardiovascular:     Rate and Rhythm: Regular rhythm. Tachycardia present.  Pulmonary:     Effort: Pulmonary effort is normal.     Breath sounds: No decreased breath sounds or wheezing.  Musculoskeletal:     Right lower leg: No edema.     Left lower leg: No edema.  Skin:    General: Skin is warm and dry.  Neurological:     Mental Status: She is alert and oriented to person, place, and time.  Psychiatric:        Mood and Affect: Mood normal.  Behavior: Behavior normal.          Scheduled Medications:   [START ON 07/14/2024] atorvastatin   10 mg Oral Daily   Chlorhexidine  Gluconate Cloth  6 each Topical Q0600   [START ON 07/14/2024] cholecalciferol   2,000 Units Oral Daily   enoxaparin  (LOVENOX ) injection  40 mg Subcutaneous Daily   [START ON 07/14/2024] folic acid   1 mg Oral Daily   lamoTRIgine   25 mg Oral QHS   Followed by   NOREEN ON 07/25/2024] lamoTRIgine   25 mg Oral BID   levETIRAcetam   500 mg Oral BID   lidocaine   1 patch Transdermal Q24H   oxyCODONE   30 mg Oral Q6H   pyridOXINE   25 mg Oral Daily   sertraline   100 mg Oral QHS   sodium chloride  flush  10-40 mL Intracatheter Q12H   [START ON 07/14/2024] thiamine   100 mg Oral Daily    Continuous Infusions:    PRN Medications:  acetaminophen , alum & mag hydroxide-simeth, artificial tears, hydrALAZINE , HYDROmorphone  (DILAUDID ) injection, hydrOXYzine , ipratropium-albuterol , sodium chloride  flush  Antimicrobials from admission:  Anti-infectives (From admission, onward)    Start     Dose/Rate Route Frequency Ordered Stop   07/08/24 1000  cefTRIAXone  (ROCEPHIN ) 2 g in sodium chloride  0.9 % 100 mL IVPB        2 g 200 mL/hr over  30 Minutes Intravenous Every 24 hours 07/05/24 1128 07/10/24 0946   07/01/24 1600  ceFEPIme  (MAXIPIME ) 2 g in sodium chloride  0.9 % 100 mL IVPB        2 g 200 mL/hr over 30 Minutes Intravenous Every 8 hours 07/01/24 1429 07/07/24 1839   07/01/24 1515  linezolid  (ZYVOX ) IVPB 600 mg        600 mg 300 mL/hr over 60 Minutes Intravenous Every 12 hours 07/01/24 1423 07/07/24 2230   06/28/24 1600  cefTRIAXone  (ROCEPHIN ) 2 g in sodium chloride  0.9 % 100 mL IVPB  Status:  Discontinued        2 g 200 mL/hr over 30 Minutes Intravenous Every 24 hours 06/28/24 1104 07/01/24 1423   06/28/24 0600  linezolid  (ZYVOX ) IVPB 600 mg  Status:  Discontinued        600 mg 300 mL/hr over 60 Minutes Intravenous Every 12 hours 06/27/24 2117 06/28/24 1102   06/28/24 0200  piperacillin -tazobactam (ZOSYN ) IVPB 3.375 g  Status:  Discontinued        3.375 g 12.5 mL/hr over 240 Minutes Intravenous Every 8 hours 06/27/24 2322 06/28/24 1104   06/27/24 1500  azithromycin  (ZITHROMAX ) 500 mg in sodium chloride  0.9 % 250 mL IVPB        500 mg 250 mL/hr over 60 Minutes Intravenous Every 24 hours 06/27/24 1403 07/02/24 2359   06/27/24 1415  linezolid  (ZYVOX ) IVPB 600 mg  Status:  Discontinued        600 mg 300 mL/hr over 60 Minutes Intravenous Every 12 hours 06/27/24 1403 06/27/24 2117   06/27/24 1400  piperacillin -tazobactam (ZOSYN ) IVPB 3.375 g  Status:  Discontinued        3.375 g 12.5 mL/hr over 240 Minutes Intravenous Every 8 hours 06/27/24 1321 06/27/24 2322   06/27/24 1115  azithromycin  (ZITHROMAX ) 500 mg in sodium chloride  0.9 % 250 mL IVPB  Status:  Discontinued        500 mg 250 mL/hr over 60 Minutes Intravenous  Once 06/27/24 1102 06/27/24 1408   06/27/24 1115  cefTRIAXone  (ROCEPHIN ) 1 g in sodium chloride  0.9 % 100 mL IVPB  1 g 200 mL/hr over 30 Minutes Intravenous  Once 06/27/24 1102 06/27/24 1200           Data Reviewed:  I have personally reviewed the following...  CBC: Recent Labs  Lab  07/08/24 0449 07/09/24 0420 07/10/24 0430 07/11/24 0331 07/13/24 0953  WBC 12.1* 13.2* 16.6* 13.9* 8.1  HGB 7.9* 8.7* 9.6* 9.2* 9.9*  HCT 24.3* 27.7* 30.5* 29.5* 30.1*  MCV 94.6 96.2 97.4 97.0 93.5  PLT 491* 663* 885* 776* 541*   Basic Metabolic Panel: Recent Labs  Lab 07/08/24 0449 07/09/24 0420 07/10/24 0031 07/10/24 0430 07/10/24 1652 07/11/24 0331 07/11/24 1807 07/13/24 0953  NA 141 141 146* 149* 138 147* 146* 141  K 3.3* 3.8 3.6 3.6 3.3* 3.4* 3.7 4.2  CL 102 103 105 106 101 110 109 102  CO2 29 28 23 23  20* 27 26 28   GLUCOSE 95 108* 112* 101* 373* 129* 132* 104*  BUN 21* 22* 21* 23* 20 19 17 15   CREATININE 0.32* 0.39* 0.41* 0.40* 0.36* 0.37* 0.40* 0.45  CALCIUM  9.1 9.3 9.6 9.7 8.7* 9.8 9.7 9.7  MG 1.7  --  1.7 1.7  --  1.9  --  1.8  PHOS 4.2 5.0*  --  3.7  --  3.8  --  4.0   GFR: Estimated Creatinine Clearance: 84.5 mL/min (by C-G formula based on SCr of 0.45 mg/dL). Liver Function Tests: Recent Labs  Lab 07/07/24 0502 07/08/24 0449 07/09/24 0420 07/10/24 0430 07/11/24 0331  ALBUMIN 2.7* 2.8* 3.3* 3.5 3.5   No results for input(s): LIPASE, AMYLASE in the last 168 hours. No results for input(s): AMMONIA in the last 168 hours. Coagulation Profile: No results for input(s): INR, PROTIME in the last 168 hours. Cardiac Enzymes: No results for input(s): CKTOTAL, CKMB, CKMBINDEX, TROPONINI in the last 168 hours. BNP (last 3 results) No results for input(s): PROBNP in the last 8760 hours. HbA1C: No results for input(s): HGBA1C in the last 72 hours. CBG: Recent Labs  Lab 07/12/24 1600 07/12/24 2203 07/13/24 0008 07/13/24 0348 07/13/24 0804  GLUCAP 115* 119* 146* 125* 160*   Lipid Profile: No results for input(s): CHOL, HDL, LDLCALC, TRIG, CHOLHDL, LDLDIRECT in the last 72 hours.  Thyroid  Function Tests: No results for input(s): TSH, T4TOTAL, FREET4, T3FREE, THYROIDAB in the last 72 hours. Anemia Panel: No  results for input(s): VITAMINB12, FOLATE, FERRITIN, TIBC, IRON, RETICCTPCT in the last 72 hours. Most Recent Urinalysis On File:     Component Value Date/Time   COLORURINE YELLOW (A) 06/27/2024 2117   APPEARANCEUR HAZY (A) 06/27/2024 2117   APPEARANCEUR Hazy 10/05/2013 0808   LABSPEC 1.013 06/27/2024 2117   LABSPEC 1.006 10/05/2013 0808   PHURINE 5.0 06/27/2024 2117   GLUCOSEU NEGATIVE 06/27/2024 2117   GLUCOSEU Negative 10/05/2013 0808   HGBUR NEGATIVE 06/27/2024 2117   BILIRUBINUR NEGATIVE 06/27/2024 2117   BILIRUBINUR neg 05/17/2014 1208   BILIRUBINUR Negative 10/05/2013 0808   KETONESUR NEGATIVE 06/27/2024 2117   PROTEINUR 30 (A) 06/27/2024 2117   UROBILINOGEN 0.2 05/17/2014 1208   NITRITE NEGATIVE 06/27/2024 2117   LEUKOCYTESUR NEGATIVE 06/27/2024 2117   LEUKOCYTESUR Negative 10/05/2013 0808   Sepsis Labs: @LABRCNTIP (procalcitonin:4,lacticidven:4) Microbiology: Recent Results (from the past 240 hours)  MRSA Next Gen by PCR, Nasal     Status: None   Collection Time: 07/10/24  9:33 AM   Specimen: Anterior Nasal Swab  Result Value Ref Range Status   MRSA by PCR Next Gen NOT DETECTED NOT DETECTED Final  Comment: (NOTE) The GeneXpert MRSA Assay (FDA approved for NASAL specimens only), is one component of a comprehensive MRSA colonization surveillance program. It is not intended to diagnose MRSA infection nor to guide or monitor treatment for MRSA infections. Test performance is not FDA approved in patients less than 100 years old. Performed at [facility], [address] [place], [place] [address]   Respiratory (~20 pathogens) panel by PCR     Status: None   Collection Time: 07/10/24  9:34 AM   Specimen: Nasopharyngeal Swab; Respiratory  Result Value Ref Range Status   Adenovirus NOT DETECTED NOT DETECTED Final   Coronavirus 229E NOT DETECTED NOT DETECTED Final    Comment: (NOTE) The Coronavirus on the Respiratory Panel, DOES NOT test for the  novel  Coronavirus (2019 nCoV)    Coronavirus HKU1 NOT DETECTED NOT DETECTED Final   Coronavirus NL63 NOT DETECTED NOT DETECTED Final   Coronavirus OC43 NOT DETECTED NOT DETECTED Final   Metapneumovirus NOT DETECTED NOT DETECTED Final   Rhinovirus / Enterovirus NOT DETECTED NOT DETECTED Final   Influenza A NOT DETECTED NOT DETECTED Final   Influenza B NOT DETECTED NOT DETECTED Final   Parainfluenza Virus 1 NOT DETECTED NOT DETECTED Final   Parainfluenza Virus 2 NOT DETECTED NOT DETECTED Final   Parainfluenza Virus 3 NOT DETECTED NOT DETECTED Final   Parainfluenza Virus 4 NOT DETECTED NOT DETECTED Final   Respiratory Syncytial Virus NOT DETECTED NOT DETECTED Final   Bordetella pertussis NOT DETECTED NOT DETECTED Final   Bordetella Parapertussis NOT DETECTED NOT DETECTED Final   Chlamydophila pneumoniae NOT DETECTED NOT DETECTED Final   Mycoplasma pneumoniae NOT DETECTED NOT DETECTED Final    Comment: Performed at [facility], [address] [place], [place] [address]  Resp panel by RT-PCR (RSV, Flu A&B, Covid) Anterior Nasal Swab     Status: None   Collection Time: 07/10/24  9:34 AM   Specimen: Anterior Nasal Swab  Result Value Ref Range Status   SARS Coronavirus 2 by RT PCR NEGATIVE NEGATIVE Final    Comment: (NOTE) SARS-CoV-2 target nucleic acids are NOT DETECTED.  The SARS-CoV-2 RNA is generally detectable in upper respiratory specimens during the acute phase of infection. The lowest concentration of SARS-CoV-2 viral copies this assay can detect is 138 copies/mL. A negative result does not preclude SARS-Cov-2 infection and should not be used as the sole basis for treatment or other patient management decisions. A negative result may occur with  improper specimen collection/handling, submission of specimen other than nasopharyngeal swab, presence of viral mutation(s) within the areas targeted by this assay, and inadequate number of viral copies(<138 copies/mL). A  negative result must be combined with clinical observations, patient history, and epidemiological information. The expected result is Negative.  Fact Sheet for Patients:  bloggercourse.com  Fact Sheet for Healthcare Providers:  seriousbroker.it  This test is no t yet approved or cleared by the [place]  FDA and  has been authorized for detection and/or diagnosis of SARS-CoV-2 by FDA under an Emergency Use Authorization (EUA). This EUA will remain  in effect (meaning this test can be used) for the duration of the COVID-19 declaration under Section 564(b)(1) of the Act, 21 U.S.C.section 360bbb-3(b)(1), unless the authorization is terminated  or revoked sooner.       Influenza A by PCR NEGATIVE NEGATIVE Final   Influenza B by PCR NEGATIVE NEGATIVE Final    Comment: (NOTE) The Xpert Xpress SARS-CoV-2/FLU/RSV plus assay is intended as an aid  in the diagnosis of influenza from Nasopharyngeal swab specimens and should not be used as a sole basis for treatment. Nasal washings and aspirates are unacceptable for Xpert Xpress SARS-CoV-2/FLU/RSV testing.  Fact Sheet for Patients: bloggercourse.com  Fact Sheet for Healthcare Providers: seriousbroker.it  This test is not yet approved or cleared by the [place]  FDA and has been authorized for detection and/or diagnosis of SARS-CoV-2 by FDA under an Emergency Use Authorization (EUA). This EUA will remain in effect (meaning this test can be used) for the duration of the COVID-19 declaration under Section 564(b)(1) of the Act, 21 U.S.C. section 360bbb-3(b)(1), unless the authorization is terminated or revoked.     Resp Syncytial Virus by PCR NEGATIVE NEGATIVE Final    Comment: (NOTE) Fact Sheet for Patients: bloggercourse.com  Fact Sheet for Healthcare  Providers: seriousbroker.it  This test is not yet approved or cleared by the [place]  FDA and has been authorized for detection and/or diagnosis of SARS-CoV-2 by FDA under an Emergency Use Authorization (EUA). This EUA will remain in effect (meaning this test can be used) for the duration of the COVID-19 declaration under Section 564(b)(1) of the Act, 21 U.S.C. section 360bbb-3(b)(1), unless the authorization is terminated or revoked.  Performed at [facility], [address] [place], [place] [address]       Radiology Studies last 3 days: DG Swallowing Func-Speech Pathology Result Date: 07/13/2024 Table formatting from the original result was not included. Modified Barium Swallow Study Patient Details Name: MARQUASHA BRUTUS MRN: [id number] Date of Birth: [birth year] Today's Date: 07/13/2024 HPI/PMH: HPI: Pt is a 43 y/o female with h/o anxiety, HTN, MDD, HCV, hepatic steatosis, HLD, GERD, seizure disorder, Polysubstance abuse, recent use of fentanyl  and heroin 48 hours ago, who presents to the hospital with progressively worsening short of breath, hypoxia.  Patient states that she had upper respiratory infection about a week ago, had a high fever on the first day of infection.  Since then, she has progressively worsening shortness of breath w/ hypoxia.  She also has a cough with large amount of yellow mucus.   She admitted with bacterial PNA, septic shock, bactermia and AKI.  She was placed on HFNC O2 support at admit, then orally intubated d/t further pulmonary decline on 12/14.  She extubated on 12/25.   Chest imaging at admit: Patchy airspace disease and consolidation in the lungs  bilaterally. There is dense consolidation of the right upper and  lower lobes, concerning for pneumonia.  2. Trace to small right pleural effusion.  This has improve per recent imaging.  NGT placed 07/09/24 to support nutrition/hydration. Clinical Impression: Clinical  Impression: Patient presents with functional oropharyngeal swallowing w/ no overt oropharyngeal phase dysphagia noted during study. No aspiration nor laryngeal penetration noted to occur. Oral phase is characterized by adequate lip closure, bolus preparation and containment, mastication, and anterior to posterior transit. Swallow initiation occurs primarily at theBOT>valleculae; age-appropriate.  Pharyngeal phase is noted for adequate tongue base retraction, adequate hyolaryngeal excursion, and adequate pharyngeal constriction. Pharyngeal stripping wave is complete. Epiglottic inversion is complete w/ trials during the study. Timely/tight epiglottic inversion/closure occurred during challenge of multiple sips via straw (d/t NGT presence and distraction). No aspiration nor laryngeal penetration occurred during the study. No overt pharyngeal residue remained post initial swallow w/ boluses presented. Amplitude/duration of cricopharyngeus opening appeared [facility]. There was adequate/complete clearance through the upper cervical Esophagus. An Esophageal sweep was not performed. A 13 mm barium tablet was not given during this study.  Presence of the NGT noted. Pt was educated on the results of the study immediately after; video viewed and questions answered. Factors that may increase risk of adverse event in presence of aspiration [name]): Factors that may increase risk of adverse event in presence of aspiration [name]): -- (none) Recommendations/Plan: Swallowing Evaluation Recommendations Swallowing Evaluation Recommendations Recommendations: PO diet PO Diet Recommendation: Regular; Thin liquids (Level 0) Liquid Administration via: Cup; Straw Medication Administration: Whole meds with liquid (vs Whole in Puree if easier) Supervision: Patient able to self-feed; Set-up assistance for safety (d/t overall weakness) Swallowing strategies  : Minimize environmental distractions; Slow rate; Small  bites/sips Postural changes: Position pt fully upright for meals; Stay upright 30-60 min after meals; Out of bed for meals Oral care recommendations: Oral care BID (2x/day); Pt independent with oral care (setup) Recommended consults: Consider dietitian consultation Treatment Plan Treatment Plan Treatment recommendations: No treatment recommended at this time Follow-up recommendations: No SLP follow up Recommendations Comment: n/a Functional status assessment: Patient has had a recent decline in their functional status and demonstrates the ability to make significant improvements in function in a reasonable and predictable amount of time. Treatment frequency: -- (n/a) Treatment duration: -- (n/a) Interventions: Aspiration precaution training; Patient/family education Recommendations Recommendations for follow up therapy are one component of a multi-disciplinary discharge planning process, led by the attending physician.  Recommendations may be updated based on patient status, additional functional criteria and insurance authorization. 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) Boluses Administered: Boluses Administered Boluses Administered: Thin liquids (Level 0); Mildly thick liquids (Level 2, nectar thick); Moderately thick liquids (Level 3, honey thick); Puree; Solid  Oral Impairment Domain: Oral Impairment Domain Lip Closure: No labial escape Tongue control during bolus hold: Cohesive bolus between tongue to palatal seal Bolus preparation/mastication: Timely and efficient chewing and mashing Bolus transport/lingual motion: Brisk tongue motion Oral residue: Complete oral clearance Location of oral residue : N/A Initiation of pharyngeal swallow : Posterior angle of the ramus (BOT>Valleculae)   Pharyngeal Impairment Domain: Pharyngeal Impairment Domain Soft palate elevation: No bolus between soft palate (SP)/pharyngeal wall (PW) Laryngeal elevation: Complete superior movement of thyroid  cartilage with complete approximation of arytenoids to epiglottic petiole Anterior hyoid excursion: Complete anterior movement Epiglottic movement: Complete inversion Laryngeal vestibule closure: Complete, no air/contrast in laryngeal vestibule Pharyngeal stripping wave : Present - complete Pharyngeal contraction (A/P view only): N/A Pharyngoesophageal segment opening: Complete distension and complete duration, no obstruction of flow Tongue base retraction: No contrast between tongue base and posterior pharyngeal wall (PPW) Pharyngeal residue: Complete pharyngeal clearance Location of pharyngeal residue: N/A  Esophageal Impairment Domain: Esophageal Impairment Domain Esophageal clearance upright position: Complete clearance, esophageal coating Pill: Pill Consistency administered: -- (n/a) Penetration/Aspiration Scale Score: Penetration/Aspiration Scale Score 1.  Material does not enter airway: Thin liquids (Level 0); Mildly thick liquids (Level 2, nectar thick); Moderately thick liquids (Level 3, honey thick); Puree; Solid Compensatory Strategies: Compensatory Strategies Compensatory strategies: No   General Information: Caregiver present: No  Diet Prior to this Study: NPO; Cortrak/Small bore NG tube   Temperature : Normal   Respiratory Status: WFL   Supplemental O2: Nasal cannula (2-4L)   History of Recent Intubation: Yes  Behavior/Cognition: Alert; Cooperative; Pleasant mood; Distractible; Requires cueing; Confused (intermittently; mild confusion) Self-Feeding Abilities: Able to self-feed; Needs set-up for self-feeding Baseline  vocal quality/speech: Normal Volitional Cough: Able to elicit Volitional Swallow: Able to elicit Exam Limitations: No limitations Goal Planning: Prognosis for improved oropharyngeal function: Good  Barriers to Reach Goals: -- (hospitalization) Barriers/Prognosis Comment: suspect impact from lengthy illness/intubation/hospitalization; Polysubstance abuse/use; severely deconditioned and weak; Pulmonary decline Patient/Family Stated Goal: to have a meal Consulted and agree with results and recommendations: Patient; Physician; Nurse; Dietitian Pain: Pain Assessment Pain Assessment: Faces Pain Score: 9 Faces Pain Scale: 4 Pain Location: back Pain Descriptors / Indicators: Aching; Discomfort; Grimacing; Guarding Pain Intervention(s): Monitored during session; Premedicated before session; Repositioned; Patient requesting pain meds-RN notified End of Session: Start Time:SLP Start Time (ACUTE ONLY): 0815 Stop Time: SLP Stop Time (ACUTE ONLY): 0915 Time Calculation:SLP Time Calculation (min) (ACUTE ONLY): 60 min Charges: SLP Evaluations $ SLP Speech Visit: 1 Visit SLP Evaluations $MBS Swallow: 1 Procedure $Swallowing Treatment: 1 Procedure SLP visit diagnosis: SLP Visit Diagnosis: Dysphagia, unspecified (R13.10) Past Medical History: Past Medical History: Diagnosis Date  Alcohol  abuse 11/02/2018  Hypertension   Seizure [facility])   sees dr maree at [facility], no longer has them. 9 years ago. Past Surgical History: Past Surgical History: Procedure Laterality Date  CARPAL TUNNEL RELEASE Right 04/11/2021  Procedure: CARPAL TUNNEL RELEASE ENDOSCOPIC;  Surgeon: Edie Norleen PARAS, MD;  Location: [facility] ORS;  Service: Orthopedics;  Laterality: Right;  Pateint requesting 1st case of the day  CERVICAL SPINE SURGERY  2019  CHOLECYSTECTOMY   [name], MS, [name] Speech Language Pathologist Rehab Services; [facility] [place] [phone] ([email or web address]) Watson,Katherine 07/13/2024, 3:11 PM  DG Abd 1 View Result Date: 07/10/2024 CLINICAL DATA:  Feeding tube placement. EXAM: ABDOMEN - 1 VIEW COMPARISON:  06/27/2024 FINDINGS: Tip of the weighted enteric tube is in the left upper abdomen in the region of the gastric body. No bowel dilatation in the  included abdomen. Right upper quadrant surgical clips. IMPRESSION: Tip of the weighted enteric tube in the left upper abdomen in the region of the gastric body. Electronically Signed   By: Andrea Gasman M.D.   On: 07/10/2024 17:27   Rapid EEG Result Date: 07/10/2024 Shelton Arlin KIDD, MD     07/10/2024  8:31 PM Patient Name: KASSADI PRESSWOOD MRN: [id number] Epilepsy Attending: Arlin KIDD Shelton Referring Physician/Provider: Kathrene Almarie Bake, NP Duration: 07/10/2024 9391 to 1002 Patient history: 43 yo F with sudden onset episodes of unresponsiveness that lasted 30 seconds, one observed by me. Patient unresponsive and was confused after. EEG to evaluate for seizure Level of alertness: Awake AEDs during EEG study: LEV, Ativan  Technical aspects: This EEG was obtained using a 10 lead EEG system positioned circumferentially without any parasagittal coverage (rapid EEG). Computer selected EEG is reviewed as  well as background features and all clinically significant events. Description: The posterior dominant rhythm consists of 10 Hz activity of moderate voltage (25-35 uV) seen predominantly in posterior head regions, symmetric and reactive to eye opening and eye closing. There is an excessive amount of 13-15 Hz distributed symmetrically and diffusely. Hyperventilation and photic stimulation were not performed.   ABNORMALITY - Excessive beta, generalized IMPRESSION: This limited ceribell eeg is within normal limits. No seizures or epileptiform discharges were seen throughout the recording. A normal interictal EEG does not exclude the diagnosis of epilepsy. Arlin KIDD Shelton   DG Chest Port 1 View Result Date: 07/09/2024 EXAM: 1 VIEW(S) XRAY OF THE CHEST 07/09/2024 07:13:00 PM COMPARISON: 07/07/2024 CLINICAL HISTORY: Respiratory failure with hypoxia (HCC) FINDINGS: LINES, TUBES AND DEVICES: Right PICC tip in right atrium. Endotracheal  tube and enteric tube removed. LUNGS AND PLEURA: Interval extubation.  Progressively low lung volumes with bronchovascular crowding. Improving peripheral right mid lung zone consolidation. No pleural effusion. No pneumothorax. HEART AND MEDIASTINUM: No acute abnormality of the cardiac and mediastinal silhouettes. BONES AND SOFT TISSUES: Partially imaged cervical fixation hardware noted. No acute osseous abnormality. IMPRESSION: 1. Improving peripheral right mid lung zone consolidation. 2. Progressively low lung volumes with bronchovascular crowding. 3. Right PICC tip in the right atrium. Electronically signed by: Dorethia Molt MD 07/09/2024 09:15 PM EST RP Workstation: [id number]           Laneta Blunt, DO [facility] 07/13/2024, 6:03 PM    Dictation software may have been used to generate the above note. Typos may occur and escape review in typed/dictated notes. Please contact Dr Blunt directly for clarity if needed.  Staff may message me via secure chat in Epic  but this may not receive an immediate response,  please page me for urgent matters!  If 7PM-7AM, please contact night coverage [email or web address]       "

## 2024-07-13 NOTE — Progress Notes (Addendum)
" °  Inpatient Rehabilitation Admissions Coordinator   I spoke with patient's spouse at bedside by phone. We discussed goals and expectations of a possible CIR admit. He feels she is improving every day and does not know yet that she needs CIR level rehab at this time. States no one has discussed her needs with him yet. He acknowledges that she did not do therapy this morning, for he had just arrived and it was their anniversary. At home he works day shift and their nephew who is 60 years old is there with special needs. Her Mom is coming from [place] to stay with her for the first week at home and his Mother can also assist. He feels they can provide 24/7 supervision once home. I encouraged him to watch her with therapy and ask questions to better understand her rehab needs, to be able to determine if she will need further rehab prior to d/c home. He does state preference for remaining in [facility] if needed and prefers even OP therapy. I await further progress with therapy to assist in determining her eventual rehab needs. Please call me with any questions.   Heron Leavell, RN, MSN Rehab Admissions Coordinator [phone]   "

## 2024-07-14 LAB — GLUCOSE, CAPILLARY
Glucose-Capillary: 145 mg/dL — ABNORMAL HIGH (ref 70–99)
Glucose-Capillary: 98 mg/dL (ref 70–99)
Glucose-Capillary: 111 mg/dL — ABNORMAL HIGH (ref 70–99)
Glucose-Capillary: 95 mg/dL (ref 70–99)

## 2024-07-14 MED ORDER — ENSURE PLUS HIGH PROTEIN PO LIQD
237.0000 mL | Freq: Two times a day (BID) | ORAL | Status: DC
  Administered 2024-07-14 – 2024-07-15 (×3): 237 mL via ORAL

## 2024-07-14 MED ORDER — ADULT MULTIVITAMIN W/MINERALS CH
1.0000 | ORAL_TABLET | Freq: Every day | ORAL | Status: DC
  Administered 2024-07-14 – 2024-07-15 (×2): 1 via ORAL
  Filled 2024-07-14 (×2): qty 1

## 2024-07-14 MED ORDER — PANTOPRAZOLE SODIUM 40 MG PO TBEC
40.0000 mg | DELAYED_RELEASE_TABLET | Freq: Every day | ORAL | Status: DC
  Administered 2024-07-14 – 2024-07-15 (×2): 40 mg via ORAL
  Filled 2024-07-14 (×2): qty 1

## 2024-07-14 NOTE — Progress Notes (Signed)
" ° °  Inpatient Rehabilitation Admissions Coordinator   I spoke with spouse by phone. He also feels that her insurance is no longer active, for she lost her job 3 weeks ago. I discussed estimated cost of care without insurance for Cir admit. He states they could not afford that. I inquired if he had seen her up with therapy this week. He states he has not. He has been out of work for the past 3 weeks since she was hospitalized. I requested that he be present all day tomorrow as to see how she mobilizes with therapy and Nursing to determine if he feels comfortable to take her directly home and to provide 24/7 assist at home.  I will notify acute team and TOC. I will follow up Friday.  Heron Leavell, RN, MSN Rehab Admissions Coordinator [phone] 07/14/2024 2:32 PM  "

## 2024-07-14 NOTE — Progress Notes (Signed)
 " PROGRESS NOTE    [name] Phillips   [name] Phillips:[id number] DOB: Apr 11, 1981  DOA: 06/27/2024 Date of Service: 07/14/2024 which is hospital day 17  PCP: [name] Antu, FNP  43 year old female with history of polysubstance use (IVDU with fentanyl , last injection 4 days ago) who presents with increased shortness of breath and admitted for management of pneumonia. She reports symptom onset around over a week ago with fevers and chills. This improved with symptomatic management, but she then developed increased shortness of breath and cough over the past couple of days prompting presentation to the ED. She reports a cough productive of yellow sputum. She has not had any sick contacts, nor has she been sick like this in the past. Patient reports recent travel in November to [place], [place] but denies any hikes or venturing into the desert. She reports recently staying at a hotel here in [place] . She reports a history of IVDU, having used IV fentanyl  4 days ago, and also reports snorting narcotics and vape use. She gets her needles from an exchange program where she also procures the water  used for injection. Denies any history of endocarditis.   12/14: admit with right sided pneumonia and respiratory failure. CXR with right lung white out, INTUBATED, S/p BRONCH, ART LINE PLACED 12/14: BLOOD CX +STREP PNEUMONIA 12/15: remains on vent 12/16: remains on vent 12/17: remains on vent, severe hypoxia, CVL placed 12/18: severe Hypoxia s/p BRONCH mucoid secretions, worsening CXR, ABX broadened to cover Pseudomonas and MRSA 12/19: severe hypoxia 12/21: attempt sedation wean, diurese 12/22: Remains critically ill requiring mechanical ventilation, levophed  and multiple sedation agents. Diuresed with Lasix  40mg  yesterday; 5.9L UO. Will attempt at [facility] today and wean vent requirements if able 12/23: Remains critically ill requiring mechanical ventilation. Still on levophed , propofol , dilaudid  and precedex . O2 requirements  back to 60%; went up to 100% yesterday d/t ETT progressing into right mainstem and needed retraction. Required versed  early this am for acute agitation when trying to get a bath. Wean vent settings as able. Will hold of on weaning sedation today as she is awake on current sedation requirements 12/24: Remains critically ill requiring mechanical ventilation. Low pressor requirements and decreasing sedation needs. Wean vent settings as able. Awake and able to answer questions appropriately. ENT following for possible trach placement next week. 12/25: Extubated to HHFNC, requiring Precedex  for anxiety/delirium. Diuresed w 4.3L UOP  12/26: Tolerating extubation, wean HHFNC as able.  Wean precedex , clonidine  started  12/27 remains in ICU on ceribell  12/28: improving and transfer to [facility] hospitalist 12/29: SLP following, still oropharyngeal dysphagia, NG still in place and patient n.p.o. otherwise.  Evaluation in process for inpatient rehab. 12/31: declines inpatient rehab    Respiratory failure secondary to severe pneumonia from S. Pneumo. - resolved Extubated 12/25 to hi-flo --> tapered PT/OT mobilize secretions (hypertonic saline, vest, flutter device). On 2 liters will do formal o2 assessment   Swallowing dysfunction - resolved NG out doing well w/ diet po    Septic shock secondary to bacteremia  now resolved.  off vasopressors, abx Monitor    AKI on presentation, .  resolved   history of polysubstance and IV drug use suboxone  prior to presentation.  All sedation and drips are discontinued after extubation.  Po oxy  Chronic hep c - outpt f/u - check hiv, rpr, hbv  CVC D/c at discharge   Seizure history Appreciate neurology recs - see note 12/28 Continue Keppra  500 mg po BID, may consider tapering again  outpatient after week 8 - defer to outpatient  Lamotrigine  SLOW up-titration as below    Morning  Night  Week 1 and 2   none   25 mg   Week 3 and 4   25 mg    25 mg  Week 5    25 mg   50 mg  Week 6   50 mg   75 mg  Week 7   75 mg 100 mg  Week 8  100 mg 125 mg  add B6 50 mg daily to ameliorate behavioral side effects NO DRIVING Seizure precautions    Prediabetes w hyperglycemia  euglycemic   Urinary retention Foley discontinuing, urinating without difficulty today   Debility Plan for home with home health, tomorrow          DVT prophylaxis: lovenox  IV fluids: no continuous IV fluids  Nutrition: diet per SLP Central lines / other devices: none Code Status: FULL CODE ACP documentation reviewed:  none on file in [facility]   TOC needs: ihome w/ home health, rolling walker Medical barriers to dispo: none now that NG out and toelrating diet      Subjective / Brief ROS:  Patient reports feeling better today  No other complaints at this time Denies CP/SOB.  Pain controlled.  Denies new weakness. [name] Phillips after foley discontinued Tolerating diet  Family Communication: none at bedside on rounds    Objective Findings:  Vitals:   07/13/24 2014 07/14/24 0444 07/14/24 0752 07/14/24 1402  BP: (!) 140/77 (!) 146/86 129/74   Pulse: 84 84 88   Resp: 18 18 18    Temp: 97.9 F (36.6 C) 98.3 F (36.8 C) 98.6 F (37 C)   TempSrc:      SpO2: 100% 97% 93% 91%  Weight:      Height:        Intake/Output Summary (Last 24 hours) at 07/14/2024 1633 Last data filed at 07/14/2024 0448 Gross per 24 hour  Intake --  Output 1000 ml  Net -1000 ml   Filed Weights   07/12/24 0500  Weight: 68.8 kg    NAD RRR CTAB Abdomen soft, nontender Ext warm, no edema       Scheduled Medications:   atorvastatin   10 mg Oral Daily   Chlorhexidine  Gluconate Cloth  6 each Topical Q0600   cholecalciferol   2,000 Units Oral Daily   enoxaparin  (LOVENOX ) injection  40 mg Subcutaneous Daily   feeding supplement  237 mL Oral BID BM   folic acid   1 mg Oral Daily   lamoTRIgine   25 mg Oral QHS   Followed by   [name] Phillips ON 07/25/2024] lamoTRIgine   25 mg Oral BID    levETIRAcetam   500 mg Oral BID   lidocaine   1 patch Transdermal Q24H   multivitamin with minerals  1 tablet Oral Daily   oxyCODONE   30 mg Oral Q6H   pyridOXINE   25 mg Oral Daily   sertraline   100 mg Oral QHS   sodium chloride  flush  10-40 mL Intracatheter Q12H   thiamine   100 mg Oral Daily    Continuous Infusions:    PRN Medications:  acetaminophen , alum & mag hydroxide-simeth, artificial tears, hydrALAZINE , HYDROmorphone  (DILAUDID ) injection, hydrOXYzine , ipratropium-albuterol , sodium chloride  flush  Antimicrobials from admission:  Anti-infectives (From admission, onward)    Start     Dose/Rate Route Frequency Ordered Stop   07/08/24 1000  cefTRIAXone  (ROCEPHIN ) 2 g in sodium chloride  0.9 % 100 mL IVPB  2 g 200 mL/hr over 30 Minutes Intravenous Every 24 hours 07/05/24 1128 07/10/24 0946   07/01/24 1600  ceFEPIme  (MAXIPIME ) 2 g in sodium chloride  0.9 % 100 mL IVPB        2 g 200 mL/hr over 30 Minutes Intravenous Every 8 hours 07/01/24 1429 07/07/24 1839   07/01/24 1515  linezolid  (ZYVOX ) IVPB 600 mg        600 mg 300 mL/hr over 60 Minutes Intravenous Every 12 hours 07/01/24 1423 07/07/24 2230   06/28/24 1600  cefTRIAXone  (ROCEPHIN ) 2 g in sodium chloride  0.9 % 100 mL IVPB  Status:  Discontinued        2 g 200 mL/hr over 30 Minutes Intravenous Every 24 hours 06/28/24 1104 07/01/24 1423   06/28/24 0600  linezolid  (ZYVOX ) IVPB 600 mg  Status:  Discontinued        600 mg 300 mL/hr over 60 Minutes Intravenous Every 12 hours 06/27/24 2117 06/28/24 1102   06/28/24 0200  piperacillin -tazobactam (ZOSYN ) IVPB 3.375 g  Status:  Discontinued        3.375 g 12.5 mL/hr over 240 Minutes Intravenous Every 8 hours 06/27/24 2322 06/28/24 1104   06/27/24 1500  azithromycin  (ZITHROMAX ) 500 mg in sodium chloride  0.9 % 250 mL IVPB        500 mg 250 mL/hr over 60 Minutes Intravenous Every 24 hours 06/27/24 1403 07/02/24 2359   06/27/24 1415  linezolid  (ZYVOX ) IVPB 600 mg  Status:   Discontinued        600 mg 300 mL/hr over 60 Minutes Intravenous Every 12 hours 06/27/24 1403 06/27/24 2117   06/27/24 1400  piperacillin -tazobactam (ZOSYN ) IVPB 3.375 g  Status:  Discontinued        3.375 g 12.5 mL/hr over 240 Minutes Intravenous Every 8 hours 06/27/24 1321 06/27/24 2322   06/27/24 1115  azithromycin  (ZITHROMAX ) 500 mg in sodium chloride  0.9 % 250 mL IVPB  Status:  Discontinued        500 mg 250 mL/hr over 60 Minutes Intravenous  Once 06/27/24 1102 06/27/24 1408   06/27/24 1115  cefTRIAXone  (ROCEPHIN ) 1 g in sodium chloride  0.9 % 100 mL IVPB        1 g 200 mL/hr over 30 Minutes Intravenous  Once 06/27/24 1102 06/27/24 1200           Data Reviewed:  I have personally reviewed the following...  CBC: Recent Labs  Lab 07/08/24 0449 07/09/24 0420 07/10/24 0430 07/11/24 0331 07/13/24 0953  WBC 12.1* 13.2* 16.6* 13.9* 8.1  HGB 7.9* 8.7* 9.6* 9.2* 9.9*  HCT 24.3* 27.7* 30.5* 29.5* 30.1*  MCV 94.6 96.2 97.4 97.0 93.5  PLT 491* 663* 885* 776* 541*   Basic Metabolic Panel: Recent Labs  Lab 07/08/24 0449 07/09/24 0420 07/10/24 0031 07/10/24 0430 07/10/24 1652 07/11/24 0331 07/11/24 1807 07/13/24 0953  NA 141 141 146* 149* 138 147* 146* 141  K 3.3* 3.8 3.6 3.6 3.3* 3.4* 3.7 4.2  CL 102 103 105 106 101 110 109 102  CO2 29 28 23 23  20* 27 26 28   GLUCOSE 95 108* 112* 101* 373* 129* 132* 104*  BUN 21* 22* 21* 23* 20 19 17 15   CREATININE 0.32* 0.39* 0.41* 0.40* 0.36* 0.37* 0.40* 0.45  CALCIUM  9.1 9.3 9.6 9.7 8.7* 9.8 9.7 9.7  MG 1.7  --  1.7 1.7  --  1.9  --  1.8  PHOS 4.2 5.0*  --  3.7  --  3.8  --  4.0  GFR: Estimated Creatinine Clearance: 84.5 mL/min (by C-G formula based on SCr of 0.45 mg/dL). Liver Function Tests: Recent Labs  Lab 07/08/24 0449 07/09/24 0420 07/10/24 0430 07/11/24 0331  ALBUMIN 2.8* 3.3* 3.5 3.5   No results for input(s): LIPASE, AMYLASE in the last 168 hours. No results for input(s): AMMONIA in the last 168  hours. Coagulation Profile: No results for input(s): INR, PROTIME in the last 168 hours. Cardiac Enzymes: No results for input(s): CKTOTAL, CKMB, CKMBINDEX, TROPONINI in the last 168 hours. BNP (last 3 results) No results for input(s): PROBNP in the last 8760 hours. HbA1C: No results for input(s): HGBA1C in the last 72 hours. CBG: Recent Labs  Lab 07/13/24 0804 07/13/24 1624 07/13/24 2142 07/14/24 0754 07/14/24 1119  GLUCAP 160* 145* 98 111* 95   Lipid Profile: No results for input(s): CHOL, HDL, LDLCALC, TRIG, CHOLHDL, LDLDIRECT in the last 72 hours.  Thyroid  Function Tests: No results for input(s): TSH, T4TOTAL, FREET4, T3FREE, THYROIDAB in the last 72 hours. Anemia Panel: No results for input(s): VITAMINB12, FOLATE, FERRITIN, TIBC, IRON, RETICCTPCT in the last 72 hours. Most Recent Urinalysis On File:     Component Value Date/Time   COLORURINE YELLOW (A) 06/27/2024 2117   APPEARANCEUR HAZY (A) 06/27/2024 2117   APPEARANCEUR Hazy 10/05/2013 0808   LABSPEC 1.013 06/27/2024 2117   LABSPEC 1.006 10/05/2013 0808   PHURINE 5.0 06/27/2024 2117   GLUCOSEU NEGATIVE 06/27/2024 2117   GLUCOSEU Negative 10/05/2013 0808   HGBUR NEGATIVE 06/27/2024 2117   BILIRUBINUR NEGATIVE 06/27/2024 2117   BILIRUBINUR neg 05/17/2014 1208   BILIRUBINUR Negative 10/05/2013 0808   KETONESUR NEGATIVE 06/27/2024 2117   PROTEINUR 30 (A) 06/27/2024 2117   UROBILINOGEN 0.2 05/17/2014 1208   NITRITE NEGATIVE 06/27/2024 2117   LEUKOCYTESUR NEGATIVE 06/27/2024 2117   LEUKOCYTESUR Negative 10/05/2013 0808   Sepsis Labs: @LABRCNTIP (procalcitonin:4,lacticidven:4) Microbiology: Recent Results (from the past 240 hours)  MRSA Next Gen by PCR, Nasal     Status: None   Collection Time: 07/10/24  9:33 AM   Specimen: Anterior Nasal Swab  Result Value Ref Range Status   MRSA by PCR Next Gen NOT DETECTED NOT DETECTED Final    Comment: (NOTE) The GeneXpert  MRSA Assay (FDA approved for NASAL specimens only), is one component of a comprehensive MRSA colonization surveillance program. It is not intended to diagnose MRSA infection nor to guide or monitor treatment for MRSA infections. Test performance is not FDA approved in patients less than 24 years old. Performed at [facility], [address] [place], [place] [address]   Respiratory (~20 pathogens) panel by PCR     Status: None   Collection Time: 07/10/24  9:34 AM   Specimen: Nasopharyngeal Swab; Respiratory  Result Value Ref Range Status   Adenovirus NOT DETECTED NOT DETECTED Final   Coronavirus 229E NOT DETECTED NOT DETECTED Final    Comment: (NOTE) The Coronavirus on the Respiratory Panel, DOES NOT test for the novel  Coronavirus (2019 nCoV)    Coronavirus HKU1 NOT DETECTED NOT DETECTED Final   Coronavirus NL63 NOT DETECTED NOT DETECTED Final   Coronavirus OC43 NOT DETECTED NOT DETECTED Final   Metapneumovirus NOT DETECTED NOT DETECTED Final   Rhinovirus / Enterovirus NOT DETECTED NOT DETECTED Final   Influenza A NOT DETECTED NOT DETECTED Final   Influenza B NOT DETECTED NOT DETECTED Final   Parainfluenza Virus 1 NOT DETECTED NOT DETECTED Final   Parainfluenza Virus 2 NOT DETECTED NOT DETECTED Final   Parainfluenza Virus 3 NOT  DETECTED NOT DETECTED Final   Parainfluenza Virus 4 NOT DETECTED NOT DETECTED Final   Respiratory Syncytial Virus NOT DETECTED NOT DETECTED Final   Bordetella pertussis NOT DETECTED NOT DETECTED Final   Bordetella Parapertussis NOT DETECTED NOT DETECTED Final   Chlamydophila pneumoniae NOT DETECTED NOT DETECTED Final   Mycoplasma pneumoniae NOT DETECTED NOT DETECTED Final    Comment: Performed at [facility] [place] [facility], [address] [place], [place] [address]  Resp panel by RT-PCR (RSV, Flu A&B, Covid) Anterior Nasal Swab     Status: None   Collection Time: 07/10/24  9:34 AM   Specimen: Anterior Nasal Swab  Result Value Ref Range Status   SARS  Coronavirus 2 by RT PCR NEGATIVE NEGATIVE Final    Comment: (NOTE) SARS-CoV-2 target nucleic acids are NOT DETECTED.  The SARS-CoV-2 RNA is generally detectable in upper respiratory specimens during the acute phase of infection. The lowest concentration of SARS-CoV-2 viral copies this assay can detect is 138 copies/mL. A negative result does not preclude SARS-Cov-2 infection and should not be used as the sole basis for treatment or other patient management decisions. A negative result may occur with  improper specimen collection/handling, submission of specimen other than nasopharyngeal swab, presence of viral mutation(s) within the areas targeted by this assay, and inadequate number of viral copies(<138 copies/mL). A negative result must be combined with clinical observations, patient history, and epidemiological information. The expected result is Negative.  Fact Sheet for Patients:  bloggercourse.com  Fact Sheet for Healthcare Providers:  seriousbroker.it  This test is no t yet approved or cleared by the [place]  FDA and  has been authorized for detection and/or diagnosis of SARS-CoV-2 by FDA under an Emergency Use Authorization (EUA). This EUA will remain  in effect (meaning this test can be used) for the duration of the COVID-19 declaration under Section 564(b)(1) of the Act, 21 U.S.C.section 360bbb-3(b)(1), unless the authorization is terminated  or revoked sooner.       Influenza A by PCR NEGATIVE NEGATIVE Final   Influenza B by PCR NEGATIVE NEGATIVE Final    Comment: (NOTE) The Xpert Xpress SARS-CoV-2/FLU/RSV plus assay is intended as an aid in the diagnosis of influenza from Nasopharyngeal swab specimens and should not be used as a sole basis for treatment. Nasal washings and aspirates are unacceptable for Xpert Xpress SARS-CoV-2/FLU/RSV testing.  Fact Sheet for  Patients: bloggercourse.com  Fact Sheet for Healthcare Providers: seriousbroker.it  This test is not yet approved or cleared by the [place]  FDA and has been authorized for detection and/or diagnosis of SARS-CoV-2 by FDA under an Emergency Use Authorization (EUA). This EUA will remain in effect (meaning this test can be used) for the duration of the COVID-19 declaration under Section 564(b)(1) of the Act, 21 U.S.C. section 360bbb-3(b)(1), unless the authorization is terminated or revoked.     Resp Syncytial Virus by PCR NEGATIVE NEGATIVE Final    Comment: (NOTE) Fact Sheet for Patients: bloggercourse.com  Fact Sheet for Healthcare Providers: seriousbroker.it  This test is not yet approved or cleared by the [place]  FDA and has been authorized for detection and/or diagnosis of SARS-CoV-2 by FDA under an Emergency Use Authorization (EUA). This EUA will remain in effect (meaning this test can be used) for the duration of the COVID-19 declaration under Section 564(b)(1) of the Act, 21 U.S.C. section 360bbb-3(b)(1), unless the authorization is terminated or revoked.  Performed at [facility], [address], [place], [place] [address]  Radiology Studies last 3 days: DG Swallowing Func-Speech Pathology Result Date: 07/13/2024 Table formatting from the original result was not included. Modified Barium Swallow Study Patient Details Name: [name] Phillips MRN: [id number] Date of Birth: Nov 01, 1980 Today's Date: 07/13/2024 HPI/PMH: HPI: Pt is a 43 y/o female with h/o anxiety, HTN, MDD, HCV, hepatic steatosis, HLD, GERD, seizure disorder, Polysubstance abuse, recent use of fentanyl  and heroin 48 hours ago, who presents to the hospital with progressively worsening short of breath, hypoxia.  Patient states that she had upper respiratory infection about a week ago, had a  high fever on the first day of infection.  Since then, she has progressively worsening shortness of breath w/ hypoxia.  She also has a cough with large amount of yellow mucus.   She admitted with bacterial PNA, septic shock, bactermia and AKI.  She was placed on HFNC O2 support at admit, then orally intubated d/t further pulmonary decline on 12/14.  She extubated on 12/25.   Chest imaging at admit: Patchy airspace disease and consolidation in the lungs  bilaterally. There is dense consolidation of the right upper and  lower lobes, concerning for pneumonia.  2. Trace to small right pleural effusion.  This has improve per recent imaging.  NGT placed 07/09/24 to support nutrition/hydration. Clinical Impression: Clinical Impression: Patient presents with functional oropharyngeal swallowing w/ no overt oropharyngeal phase dysphagia noted during study. No aspiration nor laryngeal penetration noted to occur. Oral phase is characterized by adequate lip closure, bolus preparation and containment, mastication, and anterior to posterior transit. Swallow initiation occurs primarily at theBOT>valleculae; age-appropriate.  Pharyngeal phase is noted for adequate tongue base retraction, adequate hyolaryngeal excursion, and adequate pharyngeal constriction. Pharyngeal stripping wave is complete. Epiglottic inversion is complete w/ trials during the study. Timely/tight epiglottic inversion/closure occurred during challenge of multiple sips via straw (d/t NGT presence and distraction). No aspiration nor laryngeal penetration occurred during the study. No overt pharyngeal residue remained post initial swallow w/ boluses presented. Amplitude/duration of cricopharyngeus opening appeared [facility]. There was adequate/complete clearance through the upper cervical Esophagus. An Esophageal sweep was not performed. A 13 mm barium tablet was not given during this study. Presence of the NGT noted. Pt was educated on the results of the study  immediately after; video viewed and questions answered. Factors that may increase risk of adverse event in presence of aspiration [name]): Factors that may increase risk of adverse event in presence of aspiration [name]): -- (none) Recommendations/Plan: Swallowing Evaluation Recommendations Swallowing Evaluation Recommendations Recommendations: PO diet PO Diet Recommendation: Regular; Thin liquids (Level 0) Liquid Administration via: Cup; Straw Medication Administration: Whole meds with liquid (vs Whole in Puree if easier) Supervision: Patient able to self-feed; Set-up assistance for safety (d/t overall weakness) Swallowing strategies  : Minimize environmental distractions; Slow rate; Small bites/sips Postural changes: Position pt fully upright for meals; Stay upright 30-60 min after meals; Out of bed for meals Oral care recommendations: Oral care BID (2x/day); Pt independent with oral care (setup) Recommended consults: Consider dietitian consultation Treatment Plan Treatment Plan Treatment recommendations: No treatment recommended at this time Follow-up recommendations: No SLP follow up Recommendations Comment: n/a Functional status assessment: Patient has had a recent decline in their functional status and demonstrates the ability to make significant improvements in function in a reasonable and predictable amount of time. Treatment frequency: -- (n/a) Treatment duration: -- (n/a) Interventions: Aspiration precaution training; Patient/family education Recommendations Recommendations for follow up therapy are one component of a multi-disciplinary  discharge planning process, led by the attending physician.  Recommendations may be updated based on patient status, additional functional criteria and insurance authorization. 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) Boluses Administered: Boluses Administered Boluses Administered: Thin liquids (Level 0); Mildly thick liquids (Level 2, nectar thick); Moderately thick liquids (Level 3, honey thick); Puree; Solid  Oral Impairment Domain: Oral Impairment Domain Lip Closure: No labial escape Tongue control during bolus hold: Cohesive bolus between tongue to palatal seal Bolus preparation/mastication: Timely and efficient chewing and mashing Bolus transport/lingual motion: Brisk tongue motion Oral residue: Complete oral clearance Location of oral residue : N/A Initiation of pharyngeal swallow : Posterior angle of the ramus (BOT>Valleculae)  Pharyngeal Impairment Domain: Pharyngeal Impairment Domain Soft palate elevation: No bolus between soft palate (SP)/pharyngeal wall (PW) Laryngeal elevation: Complete superior movement of thyroid  cartilage with complete approximation of arytenoids to epiglottic petiole Anterior hyoid excursion: Complete anterior movement Epiglottic movement: Complete inversion Laryngeal vestibule closure: Complete, no air/contrast in laryngeal vestibule Pharyngeal stripping wave : Present - complete Pharyngeal contraction (A/P view only): N/A Pharyngoesophageal segment opening: Complete distension and complete duration, no obstruction of flow Tongue base retraction: No contrast between tongue base and posterior pharyngeal wall (PPW) Pharyngeal residue: Complete pharyngeal clearance Location of pharyngeal residue: N/A  Esophageal Impairment Domain: Esophageal Impairment Domain Esophageal clearance upright position: Complete clearance, esophageal coating Pill: Pill Consistency administered: -- (n/a) Penetration/Aspiration Scale Score: Penetration/Aspiration Scale Score 1.  Material does not enter airway: Thin liquids (Level 0); Mildly thick liquids (Level 2, nectar thick); Moderately thick liquids (Level 3, honey thick);  Puree; Solid Compensatory Strategies: Compensatory Strategies Compensatory strategies: No   General Information: Caregiver present: No  Diet Prior to this Study: NPO; Cortrak/Small bore NG tube   Temperature : Normal   Respiratory Status: WFL   Supplemental O2: Nasal cannula (2-4L)   History of Recent Intubation: Yes  Behavior/Cognition: Alert; Cooperative; Pleasant mood; Distractible; Requires cueing; Confused (intermittently; mild confusion) Self-Feeding Abilities: Able to self-feed; Needs set-up for self-feeding Baseline vocal quality/speech: Normal Volitional Cough: Able to elicit Volitional Swallow: Able to elicit Exam Limitations: No limitations Goal Planning: Prognosis for improved oropharyngeal function: Good Barriers to Reach Goals: -- (hospitalization) Barriers/Prognosis Comment: suspect impact from lengthy illness/intubation/hospitalization; Polysubstance abuse/use; severely deconditioned and weak; Pulmonary decline Patient/Family Stated Goal: to have a meal Consulted and agree with results and recommendations: Patient; Physician; Nurse; Dietitian Pain: Pain Assessment Pain Assessment: Faces Pain Score: 9 Faces Pain Scale: 4 Pain Location: back Pain Descriptors / Indicators: Aching; Discomfort; Grimacing; Guarding Pain Intervention(s): Monitored during session; Premedicated before session; Repositioned; Patient requesting pain meds-RN notified End of Session: Start Time:SLP Start Time (ACUTE ONLY): 0815 Stop Time: SLP Stop Time (ACUTE ONLY): 0915 Time Calculation:SLP Time Calculation (min) (ACUTE ONLY): 60 min Charges: SLP Evaluations $ SLP Speech Visit: 1 Visit SLP Evaluations $MBS Swallow: 1 Procedure $Swallowing Treatment: 1 Procedure SLP visit diagnosis: SLP Visit Diagnosis: Dysphagia, unspecified (R13.10) Past Medical History: Past Medical History: Diagnosis Date  Alcohol  abuse 11/02/2018  Hypertension   Seizure [facility])   [facility] mare[facility], no longer has them. 9 years ago. Past Surgical History: Past  Surgical History: Procedure Laterality Date  CARPAL TUNNEL RELEASE Right 04/11/2021  Procedure: CARPAL TUNNEL RELEASE ENDOSCOPIC;  Surgeon: Edie Norleen PARAS, MD;  Location: [facility] ORS;  Service: Orthopedics;  Laterality: Right;  Pateint requesting 1st case of the day  CERVICAL SPINE SURGERY  2019  CHOLECYSTECTOMY   [name], MS, [name] Speech Language Pathologist Rehab Services; [facility] [phone] (ascom) Watson,Katherine 07/13/2024, 3:11 PM  DG Abd 1 View Result Date: 07/10/2024 CLINICAL DATA:  Feeding tube placement. EXAM: ABDOMEN - 1 VIEW COMPARISON:  06/27/2024 FINDINGS: Tip of the weighted enteric tube is in the left upper abdomen in the region of the gastric body. No bowel dilatation in the included abdomen. Right upper quadrant surgical clips. IMPRESSION: Tip of the weighted enteric tube in the left upper abdomen in the region of the gastric body. Electronically Signed   By: Andrea Gasman M.D.   On: 07/10/2024 17:27           Devaughn KATHEE Ban, DO [facility] 07/14/2024, 4:33 PM    Dictation software may have been used to generate the above note. Typos may occur and escape review in typed/dictated notes. Please contact Dr Marsa directly for clarity if needed.  Staff may message me via secure chat in Epic  but this may not receive an immediate response,  please page me for urgent matters!  If 7PM-7AM, please contact night coverage [email or web address]       "

## 2024-07-14 NOTE — Plan of Care (Signed)

## 2024-07-14 NOTE — TOC Progression Note (Signed)
 Transition of Care [facility]) - Progression Note    Patient Details  Name: MADIA CARVELL MRN: [id number] Date of Birth: 01/02/1981  Transition of Care [facility]) CM/SW Contact  Lauraine JAYSON Carpen, LCSW Phone Number: 07/14/2024, 1:38 PM  Clinical Narrative:   CSW met with patient and husband. Patient stated she lost her job about 3 weeks ago so she's not sure that she has insurance anymore. Discussed potential charity home health if needed. CSW updated CIR admissions coordinator.  Expected Discharge Plan and Services                                               Social Drivers of Health (SDOH) Interventions SDOH Screenings   Food Insecurity: No Food Insecurity (06/27/2024)  Housing: Low Risk (06/27/2024)  Transportation Needs: No Transportation Needs (06/27/2024)  Utilities: Not At Risk (06/27/2024)  Depression (PHQ2-9): High Risk (10/29/2023)  Tobacco Use: Medium Risk (06/27/2024)    Readmission Risk Interventions    07/06/2024    3:19 PM  Readmission Risk Prevention Plan  Transportation Screening Complete  PCP or Specialist Appt within 3-5 Days Complete  HRI or Home Care Consult Complete  Social Work Consult for Recovery Care Planning/Counseling Complete  Palliative Care Screening Not Applicable  Medication Review [employer]) Complete

## 2024-07-14 NOTE — Progress Notes (Signed)
 Occupational Therapy Treatment Patient Details Name: [name] Phillips MRN: [id number] DOB: 1980-11-02 Today's Date: 07/14/2024   History of present illness Patient is a 43 year old female with acute hypoxic respiratory failure, severe strep pneumo pneumonia, strep pneumo bacteremia, septic shock. History of polysubstance and IV drug use, on suboxone  prior to presentation.  Extubated 07/08/24   OT comments  Pt seen for OT tx. Pt eager to participate. Apologies for not acting herself recently and reports she is motivated to improve quickly. OT offered encouragement that no one was upset with her. Pt demonstrating improved functional ADL and mobility performance today, slight improvement in cognitive processing and safety as well. Pt 90% on RA at rest, requiring 2L O2 to maintain >90% with activity. Pt mod indep using PLB throughout activity. HR up to 128 with activity. Pt required close CGA for STS and steps with RW (~15' total). Endorsed feeling ~5/10 RPE. Pt required additional cues for proper technique using IS but indep with flutter valve. Pt left up in recliner, call bell, chair alarm set. Pt continues to benefit from high intensity skilled OT services to maximize pt's return to PLOF.       If plan is discharge home, recommend the following:  Assistance with cooking/housework;Assist for transportation;Direct supervision/assist for medications management;Supervision due to cognitive status;Direct supervision/assist for financial management;Help with stairs or ramp for entrance;A little help with walking and/or transfers;A little help with bathing/dressing/bathroom   Equipment Recommendations  Other (comment) (defer)    Recommendations for Other Services Rehab consult    Precautions / Restrictions Precautions Precautions: Fall Recall of Precautions/Restrictions: Impaired Precaution/Restrictions Comments: watch O2, HR, BP Restrictions Weight Bearing Restrictions Per Provider Order: No        Mobility Bed Mobility Overal bed mobility: Needs Assistance Bed Mobility: Supine to Sit     Supine to sit: Modified independent (Device/Increase time)          Transfers Overall transfer level: Needs assistance Equipment used: Rolling walker (2 wheels) Transfers: Sit to/from Stand Sit to Stand: Contact guard assist                 Balance Overall balance assessment: Needs assistance Sitting-balance support: No upper extremity supported, Feet supported Sitting balance-Leahy Scale: Good     Standing balance support: Reliant on assistive device for balance Standing balance-Leahy Scale: Fair                             ADL either performed or assessed with clinical judgement   ADL Overall ADL's : Needs assistance/impaired                     Lower Body Dressing: Bed level;Modified independent Lower Body Dressing Details (indicate cue type and reason): donn socks                    Extremity/Trunk Assessment              Vision       Perception     Praxis     Communication Communication Communication: No apparent difficulties   Cognition Arousal: Alert Behavior During Therapy: WFL for tasks assessed/performed Cognition: Cognition impaired     Awareness: Online awareness impaired, Intellectual awareness impaired     Executive functioning impairment (select all impairments): Problem solving, Reasoning OT - Cognition Comments: continues to demo some cognitive impairments in processing, safety, problem solving but with mild improvement in processing  today                 Following commands: Impaired Following commands impaired: Follows one step commands with increased time      Cueing   Cueing Techniques: Verbal cues  Exercises Other Exercises Other Exercises: Pt able to return demo use of IS with 1 VC for improving technique, indep with flutter valve Other Exercises: Pt demo's good follow through with  use of PLB during activity, without need for cues Other Exercises: Pt engaged in steps side to side, front to back, and took a few steps to the recliner with RW requiring close CGA. HR up to 128, SpO2 91% on 2L    Shoulder Instructions       General Comments      Pertinent Vitals/ Pain       Pain Assessment Pain Assessment: Faces Faces Pain Scale: Hurts a little bit Pain Location: back Pain Descriptors / Indicators: Grimacing, Guarding Pain Intervention(s): Monitored during session, Repositioned  Home Living                                          Prior Functioning/Environment              Frequency  Min 3X/week        Progress Toward Goals  OT Goals(current goals can now be found in the care plan section)  Progress towards OT goals: Progressing toward goals  Acute Rehab OT Goals Patient Stated Goal: get stronger OT Goal Formulation: With patient/family Time For Goal Achievement: 08/04/24 Potential to Achieve Goals: Good  Plan      Co-evaluation                 AM-PAC OT 6 Clicks Daily Activity     Outcome Measure   Help from another person eating meals?: A Little Help from another person taking care of personal grooming?: A Little Help from another person toileting, which includes using toliet, bedpan, or urinal?: A Lot Help from another person bathing (including washing, rinsing, drying)?: A Lot Help from another person to put on and taking off regular upper body clothing?: A Little Help from another person to put on and taking off regular lower body clothing?: A Lot 6 Click Score: 15    End of Session Equipment Utilized During Treatment: Gait belt;Oxygen;Rolling walker (2 wheels)  OT Visit Diagnosis: Unsteadiness on feet (R26.81);Repeated falls (R29.6);Muscle weakness (generalized) (M62.81)   Activity Tolerance Patient tolerated treatment well   Patient Left in chair;with call bell/phone within reach;with chair alarm  set   Nurse Communication          Time: 8659-8642 OT Time Calculation (min): 17 min  Charges: OT General Charges $OT Visit: 1 Visit OT Treatments $Therapeutic Activity: 8-22 mins  Warren SAUNDERS., MPH, MS, OTR/L ascom [phone] 07/14/2024, 2:16 PM

## 2024-07-14 NOTE — Progress Notes (Signed)
 Physical Therapy Treatment Patient Details Name: [name] Phillips MRN: [id number] DOB: September 22, 1980 Today's Date: 07/14/2024   History of Present Illness Patient is a 43 year old female with acute hypoxic respiratory failure, severe strep pneumo pneumonia, strep pneumo bacteremia, septic shock. History of polysubstance and IV drug use, on suboxone  prior to presentation.  Extubated 07/08/24    PT Comments  Patient received in bed, she is agreeable to PT session. Patient states family just left. She has improved response time to instructions this session. She is mod I with bed mobility and transfers with cga/min A. Patient ambulated ~75 feet with RW and cga. Slowed pace, no lob or significant difficulty. O2 sats down to 86% on 2 liters, she was able to recover to 91% with seated rest. Patient will continue to benefit from skilled PT to improve strength and functional independence.     If plan is discharge home, recommend the following: A little help with bathing/dressing/bathroom;A little help with walking and/or transfers   Can travel by private vehicle      yes  Equipment Recommendations  Rolling walker (2 wheels)    Recommendations for Other Services       Precautions / Restrictions Precautions Precautions: Fall Recall of Precautions/Restrictions: Impaired Precaution/Restrictions Comments: watch O2, HR, BP Restrictions Weight Bearing Restrictions Per Provider Order: No     Mobility  Bed Mobility Overal bed mobility: Modified Independent Bed Mobility: Supine to Sit     Supine to sit: Modified independent (Device/Increase time) Sit to supine: Modified independent (Device/Increase time)   General bed mobility comments: improved direction following this session    Transfers Overall transfer level: Needs assistance Equipment used: Rolling walker (2 wheels) Transfers: Sit to/from Stand Sit to Stand: Supervision, Contact guard assist                 Ambulation/Gait Ambulation/Gait assistance: Contact guard assist Gait Distance (Feet): 75 Feet Assistive device: Rolling walker (2 wheels) Gait Pattern/deviations: Step-to pattern, Decreased step length - right, Decreased step length - left, Decreased stride length Gait velocity: decr     General Gait Details: patient able to ambulate out to nurses desk this session. CGA. NO overt lob or significant difficulty. O2 sats down to 86% on 2 liters while ambulating.   Stairs             Wheelchair Mobility     Tilt Bed    Modified Rankin (Stroke Patients Only)       Balance Overall balance assessment: Needs assistance Sitting-balance support: Feet supported Sitting balance-Leahy Scale: Good     Standing balance support: Bilateral upper extremity supported, During functional activity, Reliant on assistive device for balance Standing balance-Leahy Scale: Good                              Communication Communication Communication: No apparent difficulties  Cognition Arousal: Alert     PT - Cognitive impairments: Problem solving, Sequencing                         Following commands: Impaired Following commands impaired: Follows one step commands with increased time    Cueing Cueing Techniques: Verbal cues  Exercises      General Comments        Pertinent Vitals/Pain Pain Assessment Pain Assessment: No/denies pain    Home Living  Prior Function            PT Goals (current goals can now be found in the care plan section) Acute Rehab PT Goals Patient Stated Goal: to be able to get up and go home PT Goal Formulation: With patient Time For Goal Achievement: 07/21/24 Potential to Achieve Goals: Good Progress towards PT goals: Progressing toward goals    Frequency    Min 3X/week      PT Plan      Co-evaluation              AM-PAC PT 6 Clicks Mobility   Outcome Measure   Help needed turning from your back to your side while in a flat bed without using bedrails?: None Help needed moving from lying on your back to sitting on the side of a flat bed without using bedrails?: None Help needed moving to and from a bed to a chair (including a wheelchair)?: A Little Help needed standing up from a chair using your arms (e.g., wheelchair or bedside chair)?: A Little Help needed to walk in hospital room?: A Little Help needed climbing 3-5 steps with a railing? : A Lot 6 Click Score: 19    End of Session Equipment Utilized During Treatment: Gait belt;Oxygen Activity Tolerance: Patient limited by fatigue Patient left: in bed;with call bell/phone within reach;with bed alarm set Nurse Communication: Mobility status PT Visit Diagnosis: Muscle weakness (generalized) (M62.81)     Time: 1545-1600 PT Time Calculation (min) (ACUTE ONLY): 15 min  Charges:    $Gait Training: 8-22 mins PT General Charges $$ ACUTE PT VISIT: 1 Visit                     Arneisha Kincannon, PT, GCS 07/14/2024,4:19 PM

## 2024-07-14 NOTE — Progress Notes (Signed)
 Nutrition Follow-up  DOCUMENTATION CODES:   Not applicable  INTERVENTION:   -Continue regular diet -Ensure Plus High Protein po BID, each supplement provides 350 kcal and 20 grams of protein  -Magic cup TID with meals, each supplement provides 290 kcal and 9 grams of protein  -MVI with minerals daily -Continue 100 mg thiamine  daily -Continue Cholecalciferol  2,000 daily  -Patient complaining of persistent heart burn; discussed with RN and MD   NUTRITION DIAGNOSIS:   Inadequate oral intake related to inability to eat (pt sedated and ventilated) as evidenced by NPO status.  Progressing; advanced to PO diet on 07/13/24   GOAL:   Patient will meet greater than or equal to 90% of their needs  Progressing   MONITOR:   PO intake, Supplement acceptance  REASON FOR ASSESSMENT:   Ventilator    ASSESSMENT:   43 y/o female with h/o anxiety, HTN, MDD, HCV, hepatic steatosis, HLD, GERD, seizures, etoh and substance abuse who is admitted with bacterial PNA, septic shock, bactermia and AKI.  12/29- s/p EEG, s/p BSE- NPO 12/30- s/p MBSS- regular diet, NGT removed and TF d/c  Reviewed I/O's: -150 ml x 24 hours and +4.7 L since 06/30/24  UOP: 1. L x 24 hours  Patient sitting up in bed, pleasant and in good spirits today. She is eager to engage RD in conversation. Patient reports feeling much better and happy to get NGT out. She reports she is not eating much secondary to severe heartburn. She reports trying milk of magnesia, but this does not help. She denies any history of heartburn; this is new to this hospitalization.   Case discussed with RN, who confirms complaints of heartburn. She is taking Mylants and reports that the has had heartburn since NGT was placed and since is has been removed. MD aware and will assess on rounds.   No new weight since last visit.   Discussed importance of good meal and supplement intake to promote healing. Patient amenable to supplements.   Per  discussion with RN, plan to discharge to CIR.   Medications reviewed and include vitamin D3, lovenox , keppra , vitamin B6, and thiamine .   Labs reviewed: CBGS: 92-160.    Diet Order:   Diet Order             Diet regular Room service appropriate? Yes with Assist; Fluid consistency: Thin  Diet effective now                   EDUCATION NEEDS:   Education needs have been addressed  Skin:  Skin Assessment: Reviewed RN Assessment  Last BM:  07/14/24 (type 7)  Height:   Ht Readings from Last 1 Encounters:  07/04/24 5' 4.02 (1.626 m)    Weight:   Wt Readings from Last 1 Encounters:  07/12/24 68.8 kg    Ideal Body Weight:  54.5 kg  BMI:  Body mass index is 26.02 kg/m.  Estimated Nutritional Needs:   Kcal:  1900-2200kcal/day  Protein:  95-110g/day  Fluid:  1.7-1.9L/day    Margery ORN, RD, LDN, CDCES Registered Dietitian III Certified Diabetes Care and Education Specialist If unable to reach this RD, please use RD Inpatient group chat on secure chat between hours of 8am-4 pm daily

## 2024-07-14 NOTE — Evaluation (Signed)
 Speech Language Pathology Evaluation Patient Details Name: [name] Phillips MRN: [id number] DOB: 1980/08/08 Today's Date: 07/14/2024 Time: 9164-9147 SLP Time Calculation (min) (ACUTE ONLY): 17 min  Problem List:  Patient Active Problem List   Diagnosis Date Noted   Opioid dependence with opioid-induced disorder (HCC) 07/08/2024   Streptococcal pneumonia 07/05/2024   Bacteremia due to Streptococcus pneumoniae 07/05/2024   Acute hypoxemic respiratory failure (HCC) 06/27/2024   Severe sepsis (HCC) 06/27/2024   Pneumonia 06/27/2024   AKI (acute kidney injury) 06/27/2024   Hyponatremia 06/27/2024   Hypokalemia 06/27/2024   Lack of concentration 10/29/2023   Gastroesophageal reflux disease with esophagitis without hemorrhage 10/29/2023   Overweight (BMI 25.0-29.9) 10/29/2023   Elevated LDL cholesterol level 02/25/2022   Rosacea 02/25/2022   Toenail fungus 02/25/2022   Hyperglycemia 10/12/2021   Hepatic steatosis 08/17/2021   Hyperlipidemia 08/17/2021   Hyperhidrosis 07/13/2021   Polyarthralgia 06/13/2021   Elevated liver function tests 06/13/2021   Opioid abuse, in remission (HCC) 06/12/2021   Carpal tunnel syndrome, right 03/23/2021   Insomnia 10/18/2020   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 9 years ago.   Past Surgical History:  Past Surgical History:  Procedure Laterality Date   CARPAL TUNNEL RELEASE Right 04/11/2021   Procedure: CARPAL TUNNEL RELEASE ENDOSCOPIC;  Surgeon: Edie Norleen PARAS, MD;  Location: [facility] ORS;  Service:  Orthopedics;  Laterality: Right;  Pateint requesting 1st case of the day   CERVICAL SPINE SURGERY  2019   CHOLECYSTECTOMY     HPI:  Pt is a 43 y/o female with h/o anxiety, HTN, MDD, HCV, hepatic steatosis, HLD, GERD, seizure disorder, Polysubstance abuse, recent use of fentanyl  and heroin 48 hours ago, who presents to the hospital with progressively worsening short of breath, hypoxia.  Patient states that she had upper respiratory infection about a week ago, had a high fever on the first day of infection.  Since then, she has progressively worsening shortness of breath w/ hypoxia.  She also has a cough with large amount of yellow mucus.   She admitted with bacterial PNA, septic shock, bactermia and AKI.  She was placed on HFNC O2 support at admit, then orally intubated d/t further pulmonary decline on 12/14.  She extubated on 12/25.   Chest imaging at admit: Patchy airspace disease and consolidation in the lungs  bilaterally. There is dense consolidation of the right upper and  lower lobes, concerning for pneumonia.  2. Trace to small right pleural effusion.  This has improve per recent imaging.   Assessment / Plan / Recommendation Clinical Impression  Pt seen for cognitive-linguistic evaluation. Evaluation completed via informal means and screening. [employer] Mental Status Examination completed with pt scoring 18/30. Noted deficits in attention, memory, problem solving, reasoning, executive functioning, and insight. ?pt's baseline level of functioning. No family present to provide collateral. Recommend continued ST services at next level of care for further assessment and treatment of above mentioned deficits in a more functional setting.    SLP Assessment  SLP Recommendation/Assessment: All further Speech Language  Pathology needs can be addressed in the next venue of care SLP Visit Diagnosis: Cognitive communication deficit (R41.841)     Assistance Recommended at Discharge  Frequent or  constant Supervision/Assistance  Functional Status Assessment Patient has had a recent decline in their functional status and demonstrates the ability to make significant improvements in function in a reasonable and predictable amount of time.  Frequency and Duration           SLP Evaluation Cognition  Overall Cognitive Status: No family/caregiver present to determine baseline cognitive functioning Arousal/Alertness: Awake/alert Orientation Level: Disoriented to time Year: 2025 Month: January Day of Week: Incorrect Attention: Sustained Sustained Attention: Impaired Memory: Impaired Memory Impairment: Storage deficit;Retrieval deficit;Decreased recall of new information;Decreased short term memory Awareness: Impaired Awareness Impairment: Emergent impairment Problem Solving: Impaired Problem Solving Impairment: Functional basic;Functional complex Executive Function: Sequencing;Organizing;Decision Making;Self Monitoring;Self Correcting Safety/Judgment: Impaired       Community Education Officer Comprehension Overall Auditory Comprehension: Appears within functional limits for tasks assessed (occasional repetition of stimuli)    Expression Expression Primary Mode of Expression: Verbal Verbal Expression Overall Verbal Expression: Appears within functional limits for tasks assessed   Oral / Motor  Oral Motor/Sensory Function Overall Oral Motor/Sensory Function: Within functional limits Motor Speech Overall Motor Speech: Appears within functional limits for tasks assessed Respiration: Within functional limits Phonation: Normal Resonance: Within functional limits Articulation: Within functional limitis Intelligibility: Intelligible Motor Planning: Within functional limits           Delon Bangs, M.S., [name] Speech-Language Pathologist [facility] [phone] (ASCOM)  Delon CHRISTELLA Bangs 07/14/2024, 9:12 AM

## 2024-07-15 ENCOUNTER — Inpatient Hospital Stay

## 2024-07-15 LAB — HEPATITIS B SURFACE ANTIGEN: Hepatitis B Surface Ag: NONREACTIVE

## 2024-07-15 LAB — HIV ANTIBODY (ROUTINE TESTING W REFLEX): HIV Screen 4th Generation wRfx: NONREACTIVE

## 2024-07-15 LAB — D-DIMER, QUANTITATIVE: D-Dimer, Quant: 0.97 ug{FEU}/mL — ABNORMAL HIGH (ref 0.00–0.50)

## 2024-07-15 MED ORDER — LEVETIRACETAM ER 500 MG PO TB24: 500.0000 mg | ORAL_TABLET | Freq: Two times a day (BID) | ORAL | 1 refills | Status: AC

## 2024-07-15 MED ORDER — IOHEXOL 350 MG/ML SOLN
75.0000 mL | Freq: Once | INTRAVENOUS | Status: AC | PRN
  Administered 2024-07-15: 75 mL via INTRAVENOUS

## 2024-07-15 MED ORDER — LAMOTRIGINE 25 MG PO TABS: ORAL_TABLET | ORAL | 1 refills | Status: AC

## 2024-07-15 MED ORDER — PYRIDOXINE HCL 25 MG PO TABS: 25.0000 mg | ORAL_TABLET | Freq: Every day | ORAL | 1 refills | Status: AC

## 2024-07-15 NOTE — Progress Notes (Signed)
 Patient Saturations on Room Air at Rest = 94%  Patient Saturations on [employer] while Ambulating = 87%  Patient Saturations on 3 Liters of oxygen while Ambulating = 92%

## 2024-07-15 NOTE — Progress Notes (Signed)
 Patient has mobility impairment for daily activities. A rolling walker will resolve this and the patient is safe to use it

## 2024-07-15 NOTE — Plan of Care (Signed)
" °  Problem: Education: Goal: Knowledge of General Education information will improve Description: Including pain rating scale, medication(s)/side effects and non-pharmacologic comfort measures Outcome: Adequate for Discharge   Problem: Health Behavior/Discharge Planning: Goal: Ability to manage health-related needs will improve Outcome: Adequate for Discharge   Problem: Clinical Measurements: Goal: Ability to maintain clinical measurements within normal limits will improve Outcome: Adequate for Discharge Goal: Will remain free from infection Outcome: Adequate for Discharge Goal: Diagnostic test results will improve Outcome: Adequate for Discharge Goal: Respiratory complications will improve Outcome: Adequate for Discharge Goal: Cardiovascular complication will be avoided Outcome: Adequate for Discharge   Problem: Activity: Goal: Risk for activity intolerance will decrease Outcome: Adequate for Discharge   Problem: Nutrition: Goal: Adequate nutrition will be maintained Outcome: Adequate for Discharge   Problem: Coping: Goal: Level of anxiety will decrease Outcome: Adequate for Discharge   Problem: Elimination: Goal: Will not experience complications related to bowel motility Outcome: Adequate for Discharge Goal: Will not experience complications related to urinary retention Outcome: Adequate for Discharge   Problem: Pain Managment: Goal: General experience of comfort will improve and/or be controlled Outcome: Adequate for Discharge   Problem: Safety: Goal: Ability to remain free from injury will improve Outcome: Adequate for Discharge   Problem: Skin Integrity: Goal: Risk for impaired skin integrity will decrease Outcome: Adequate for Discharge   Problem: Activity: Goal: Ability to tolerate increased activity will improve Outcome: Adequate for Discharge   Problem: Clinical Measurements: Goal: Ability to maintain a body temperature in the normal range will  improve Outcome: Adequate for Discharge   Problem: Respiratory: Goal: Ability to maintain adequate ventilation will improve Outcome: Adequate for Discharge Goal: Ability to maintain a clear airway will improve Outcome: Adequate for Discharge   Problem: Activity: Goal: Ability to tolerate increased activity will improve Outcome: Adequate for Discharge   Problem: Respiratory: Goal: Ability to maintain a clear airway and adequate ventilation will improve Outcome: Adequate for Discharge   Problem: Role Relationship: Goal: Method of communication will improve Outcome: Adequate for Discharge   Problem: Education: Goal: Ability to describe self-care measures that may prevent or decrease complications (Diabetes Survival Skills Education) will improve Outcome: Adequate for Discharge   Problem: Coping: Goal: Ability to adjust to condition or change in health will improve Outcome: Adequate for Discharge   Problem: Fluid Volume: Goal: Ability to maintain a balanced intake and output will improve Outcome: Adequate for Discharge   Problem: Health Behavior/Discharge Planning: Goal: Ability to identify and utilize available resources and services will improve Outcome: Adequate for Discharge Goal: Ability to manage health-related needs will improve Outcome: Adequate for Discharge   Problem: Metabolic: Goal: Ability to maintain appropriate glucose levels will improve Outcome: Adequate for Discharge   Problem: Nutritional: Goal: Maintenance of adequate nutrition will improve Outcome: Adequate for Discharge Goal: Progress toward achieving an optimal weight will improve Outcome: Adequate for Discharge   Problem: Skin Integrity: Goal: Risk for impaired skin integrity will decrease Outcome: Adequate for Discharge   Problem: Tissue Perfusion: Goal: Adequacy of tissue perfusion will improve Outcome: Adequate for Discharge   "

## 2024-07-15 NOTE — Progress Notes (Signed)
 SATURATION QUALIFICATIONS: (This note is used to comply with regulatory documentation for home oxygen)  Patient Saturations on Room Air at Rest = 94%  Patient Saturations on Room Air while Ambulating = 87%  Patient Saturations on 3 Liters of oxygen while Ambulating = 92%  Please briefly explain why patient needs home oxygen: To perform ADL's while maintaining oxygen saturation at 92% or above.

## 2024-07-15 NOTE — Procedures (Signed)
 Routine EEG Report  [name] Phillips is a 44 y.o. female with a history of episodes of unresponsiveness and altered mental status who is undergoing an EEG to evaluate for seizures.  Report: This EEG was acquired with electrodes placed according to the International 10-20 electrode system (including Fp1, Fp2, F3, F4, C3, C4, P3, P4, O1, O2, T3, T4, T5, T6, A1, A2, Fz, Cz, Pz). The following electrodes were missing or displaced: none.  The occipital dominant rhythm was 10 Hz with overriding beta frequencies. This activity is reactive to stimulation. Drowsiness was manifested by background fragmentation; deeper stages of sleep were not identified. There was no focal slowing. There were no interictal epileptiform discharges. There were no electrographic seizures identified. There was no abnormal response to photic stimulation or hyperventilation.   Impression and clinical correlation: This EEG was obtained while awake and drowsy and is normal. No spells were captured. A normal routine EEG does not rule out epilepsy.  Elida Ross, MD [facility] [phone]  If 7pm- 7am, please page neurology on call as listed in [facility].

## 2024-07-15 NOTE — Progress Notes (Signed)
 Pt's PICC is removed. DC instructions and education given to pt. All the belongings returned. Pt is ready to be wheeled out. Family is by bedside.

## 2024-07-15 NOTE — Progress Notes (Signed)
 Attempted to do walk test twice. Both times patient sats dropped to 80% on room air. I ended up having to put her on 6L [place] and patient O2 only came up to 85% while standing and HR went up to 120's. Patient returned to bed oxygen reduced back to 2 liters until patient recovered. Patient unable to complete walk test safely at this time.

## 2024-07-15 NOTE — Discharge Summary (Signed)
 [name] Phillips FMW:[id number] DOB: 08/12/1980 DOA: 06/27/2024  PCP: Corwin Antu, FNP  Admit date: 06/27/2024 Discharge date: 07/15/2024  Time spent: 35 minutes  Recommendations for Outpatient Follow-up:  Neurology f/u Pcp f/u 1 week     Discharge Diagnoses:  Principal Problem:   Acute hypoxemic respiratory failure (HCC) Active Problems:   Seizure (HCC)   Severe sepsis (HCC)   Pneumonia   AKI (acute kidney injury)   Hyponatremia   Hypokalemia   Streptococcal pneumonia   Bacteremia due to Streptococcus pneumoniae   Opioid dependence with opioid-induced disorder (HCC)   Discharge Condition: stable  Diet recommendation: regular  Filed Weights   07/12/24 0500  Weight: 68.8 kg    History of present illness:  From admission h and p [name] Phillips is a 44 y.o. female with medical history significant of essential hypertension, seizure disorder, polysubstance abuse, recent use of fentanyl  and heroin 48 hours ago, who presents to the hospital with progressively worsening short of breath, hypoxia.     Patient states that she had upper respiratory infection about a week ago, had a high fever on the first day of infection.  Since then, she has progressively worsening shortness of breath.  She also has a cough with large amount of yellow mucus.  No hemoptysis.  She snored fentanyl  and heroin 48 hours ago. She no longer has any fever, she has no nausea vomiting, she has no diarrhea.  She did not have any urinary symptoms.   Upon arriving to hospital, her blood pressure dropped down to 69/55, heart rate 84, respiratory rate 23.  Lab results showed severe leukocytosis of 20.8, lactic acid 2.4, sodium 133, potassium 3.1, creatinine 2.05.  Chest x-ray showed a widening right sided lung, with scattered infiltrate on the left.   In the ED, patient had a 2 L IV fluid bolus, blood pressure is better.  Patient was also given Rocephin  and Zithromax .    Hospital Course:   44 year old  female with history of polysubstance use (IVDU with fentanyl , last injection 4 days ago) who presents with increased shortness of breath and admitted for management of pneumonia. She reports symptom onset around over a week ago with fevers and chills. This improved with symptomatic management, but she then developed increased shortness of breath and cough over the past couple of days prompting presentation to the ED. She reports a cough productive of yellow sputum. She has not had any sick contacts, nor has she been sick like this in the past. Patient reports recent travel in November to [place], [place] but denies any hikes or venturing into the desert. She reports recently staying at a hotel here in [place] . She reports a history of IVDU, having used IV fentanyl  4 days ago, and also reports snorting narcotics and vape use. She gets her needles from an exchange program where she also procures the water  used for injection. Denies any history of endocarditis.   12/14: admit with right sided pneumonia and respiratory failure. CXR with right lung white out, INTUBATED, S/p BRONCH, ART LINE PLACED 12/14: BLOOD CX +STREP PNEUMONIA 12/15: remains on vent 12/16: remains on vent 12/17: remains on vent, severe hypoxia, CVL placed 12/18: severe Hypoxia s/p BRONCH mucoid secretions, worsening CXR, ABX broadened to cover Pseudomonas and MRSA 12/19: severe hypoxia 12/21: attempt sedation wean, diurese 12/22: Remains critically ill requiring mechanical ventilation, levophed  and multiple sedation agents. Diuresed with Lasix  40mg  yesterday; 5.9L UO. Will attempt at [facility] today and wean vent requirements  if able 12/23: Remains critically ill requiring mechanical ventilation. Still on levophed , propofol , dilaudid  and precedex . O2 requirements back to 60%; went up to 100% yesterday d/t ETT progressing into right mainstem and needed retraction. Required versed  early this am for acute agitation when trying to get a bath. Wean  vent settings as able. Will hold of on weaning sedation today as she is awake on current sedation requirements 12/24: Remains critically ill requiring mechanical ventilation. Low pressor requirements and decreasing sedation needs. Wean vent settings as able. Awake and able to answer questions appropriately. ENT following for possible trach placement next week. 12/25: Extubated to HHFNC, requiring Precedex  for anxiety/delirium. Diuresed w 4.3L UOP  12/26: Tolerating extubation, wean HHFNC as able.  Wean precedex , clonidine  started  12/27 remains in ICU on ceribell  12/28: improving and transfer to [facility] hospitalist 12/29: SLP following, still oropharyngeal dysphagia, NG still in place and patient n.p.o. otherwise.  Evaluation in process for inpatient rehab. 12/31: declines inpatient rehab  Respiratory failure secondary to severe pneumonia from S. Pneumo. - resolved Extubated 12/25 to hi-flo --> tapered Now weaned off o2 at rest but desats with ambulation. Tolerates this OK. [employer] will offer patient a self-pay rate for oxygen. Patient has their contact information. She elects to discharge without oxygen, will determine after discharge whether she wants to pay for it.   Strep pneumo pneumonia with strep pneumo bacteremia Treated, resolved. Repeat CT no PE, resolving infection   Swallowing dysfunction - resolved Has NG tube for feed, now out doing well w/ po    Septic shock secondary to bacteremia  now resolved.  Stable off vasopressors, abx   AKI on presentation, .  resolved   history of polysubstance and IV drug use suboxone  prior to presentation.  All sedation and drips are discontinued after extubation.  Has suboxone  at home, says is undecided whether she will re-start   Chronic hep c - outpt gi f/u (patient says she is established with GI)   Seizure disorder Continue Keppra  500 mg po BID, may consider tapering again outpatient after week 8 - defer to outpatient  Lamotrigine  SLOW  up-titration as below    Morning  Night  Week 1 and 2   none   25 mg   Week 3 and 4   25 mg    25 mg  Week 5   25 mg   50 mg  Week 6   50 mg   75 mg  Week 7   75 mg 100 mg  Week 8  100 mg 125 mg  add B6 50 mg daily to ameliorate behavioral side effects NO DRIVING Seizure precautions  Referred to [facility] neurology   Urinary retention resolved   Debility Declined by inpatient rehab, no insurance so no skilled nursing, charity home health ordered, charity walker ordered  Procedures: Eeg, ng tube, central line   Consultations: pccm  Discharge Exam: Vitals:   07/15/24 0428 07/15/24 0721  BP: 132/86 117/68  Pulse: 92 95  Resp: 18 16  Temp: 98.3 F (36.8 C) 98 F (36.7 C)  SpO2: 95% 92%    General: NAD Cardiovascular: RRR Respiratory: rales right base, otherwise clear  Discharge Instructions   Discharge Instructions     Ambulatory referral to Neurology   Complete by: As directed    Increase activity slowly   Complete by: As directed       Allergies as of 07/15/2024       Reactions   Varenicline  Tartrate Other (  See Comments)   varenicline    Wellbutrin  [bupropion ]    Suicidal thoughts        Medication List     TAKE these medications    atorvastatin  10 MG tablet Commonly known as: LIPITOR Take 1 tablet (10 mg total) by mouth daily. MUST HAVE OV FOR FURTHER REFILLS   Buprenorphine  HCl-Naloxone  HCl 2-0.5 MG Film Place 0.5 Film under the tongue daily.   cetirizine 10 MG tablet Commonly known as: ZYRTEC Take 10 mg by mouth daily.   cloNIDine  0.1 MG tablet Commonly known as: CATAPRES  Take 0.1 mg by mouth 3 (three) times daily as needed.   gabapentin 300 MG capsule Commonly known as: NEURONTIN Take 300 mg by mouth 3 (three) times daily as needed.   hydrOXYzine  25 MG capsule Commonly known as: VISTARIL  Take 25 mg by mouth 3 (three) times daily.   lamoTRIgine  25 MG tablet Commonly known as: LaMICtal  1 tab nightly until 1/10. Then 1 tab twice  a day for 2 weeks. Then 1 tab in the morning and 2 at night for 1 week. Then 2 tabs in the morning and 3 at night for 1 week. Then 3 tabs in the morning and 4 at night for 1 week. Then 4 tabs in the morning and 5 at night.   levETIRAcetam  500 MG 24 hr tablet Commonly known as: KEPPRA  XR Take 1 tablet (500 mg total) by mouth 2 (two) times daily.   Melatonin 12 MG Tabs Take 3 mg by mouth at bedtime.   Multi-Vitamin tablet Take 1 tablet by mouth daily.   omeprazole  20 MG capsule Commonly known as: PRILOSEC Take 1 capsule (20 mg total) by mouth daily.   sertraline  100 MG tablet Commonly known as: ZOLOFT  Take two tablets once daily               Durable Medical Equipment  (From admission, onward)           Start     Ordered   07/15/24 1454  For home use only DME oxygen  Once       Question Answer Comment  Length of Need 6 Months   Mode or (Route) Nasal cannula   Liters per Minute 3   Frequency Continuous (stationary and portable oxygen unit needed)   Oxygen delivery system: Gas      07/15/24 1453   07/14/24 1638  For home use only DME Walker rolling  Once       Question Answer Comment  Walker: With 5 Inch Wheels   Patient needs a walker to treat with the following condition Pneumonia      07/14/24 1637           Allergies[1]  Contact information for follow-up providers     Maree Jannett POUR, MD Follow up.   Specialty: Neurology Contact information: [phone] [facility] [address] [facility] [place] [address] [phone]         Corwin Antu, FNP Follow up.   Specialty: Family Medicine Why: in about 1 week Contact information: [address] [place] [address] [phone]              Contact information for after-discharge care     Home Medical Care     [facility] -  .   Service: Home Health Services Contact information: [address] [place]  [address] [phone]  The results of significant diagnostics from this hospitalization (including imaging, microbiology, ancillary and laboratory) are listed below for reference.    Significant Diagnostic Studies: CT Angio Chest Pulmonary Embolism (PE) W or WO Contrast Result Date: 07/15/2024 EXAM: CTA CHEST 07/15/2024 01:30:21 PM TECHNIQUE: CTA of the chest was performed without and with the administration of 75 mL of iohexol (OMNIPAQUE) 350 MG/ML injection. Multiplanar reformatted images are provided for review. MIP images are provided for review. Automated exposure control, iterative reconstruction, and/or weight based adjustment of the mA/kV was utilized to reduce the radiation dose to as low as reasonably achievable. COMPARISON: 06/27/2024 CLINICAL HISTORY: Hypoxia, elevated dimer. FINDINGS: PULMONARY ARTERIES: Pulmonary arteries are adequately opacified for evaluation. No acute pulmonary embolus. Main pulmonary artery is normal in caliber. MEDIASTINUM: The heart and pericardium demonstrate no acute abnormality. There is no acute abnormality of the thoracic aorta. LYMPH NODES: No mediastinal, hilar or axillary lymphadenopathy. LUNGS AND PLEURA: Previous pneumonia. Consolidation of the right lung has improved. Diffuse airspace opacities are present throughout the right lung and in the left upper lobe. There is some sparing of the superior segment of the left lower lobe. Patchy peribronchial airspace opacities are present at the left base. No evidence of pleural effusion or pneumothorax. UPPER ABDOMEN: Surgical clips are present at the gallbladder fossa. SOFT TISSUES AND BONES: No acute bone or soft tissue abnormality. IMPRESSION: 1. No evidence of pulmonary embolism. 2. Improved consolidation of the right lung with persistent diffuse airspace opacities in the right lung and left upper lobe, with patchy peribronchial airspace opacities at the left base. Electronically signed by: Lonni Necessary MD  07/15/2024 02:12 PM EST RP Workstation: [id number]   DG Swallowing Func-Speech Pathology Result Date: 07/13/2024 Table formatting from the original result was not included. Modified Barium Swallow Study Patient Details Name: TABATHA RAZZANO MRN: [id number] Date of Birth: 1981-06-28 Today's Date: 07/13/2024 HPI/PMH: HPI: Pt is a 44 y/o female with h/o anxiety, HTN, MDD, HCV, hepatic steatosis, HLD, GERD, seizure disorder, Polysubstance abuse, recent use of fentanyl  and heroin 48 hours ago, who presents to the hospital with progressively worsening short of breath, hypoxia.  Patient states that she had upper respiratory infection about a week ago, had a high fever on the first day of infection.  Since then, she has progressively worsening shortness of breath w/ hypoxia.  She also has a cough with large amount of yellow mucus.   She admitted with bacterial PNA, septic shock, bactermia and AKI.  She was placed on HFNC O2 support at admit, then orally intubated d/t further pulmonary decline on 12/14.  She extubated on 12/25.   Chest imaging at admit: Patchy airspace disease and consolidation in the lungs  bilaterally. There is dense consolidation of the right upper and  lower lobes, concerning for pneumonia.  2. Trace to small right pleural effusion.  This has improve per recent imaging.  NGT placed 07/09/24 to support nutrition/hydration. Clinical Impression: Clinical Impression: Patient presents with functional oropharyngeal swallowing w/ no overt oropharyngeal phase dysphagia noted during study. No aspiration nor laryngeal penetration noted to occur. Oral phase is characterized by adequate lip closure, bolus preparation and containment, mastication, and anterior to posterior transit. Swallow initiation occurs primarily at theBOT>valleculae; age-appropriate.  Pharyngeal phase is noted for adequate tongue base retraction, adequate hyolaryngeal excursion, and adequate pharyngeal constriction. Pharyngeal stripping wave  is complete. Epiglottic inversion is complete w/ trials during the study. Timely/tight epiglottic inversion/closure occurred during challenge of multiple sips via straw (d/t NGT presence and  distraction). No aspiration nor laryngeal penetration occurred during the study. No overt pharyngeal residue remained post initial swallow w/ boluses presented. Amplitude/duration of cricopharyngeus opening appeared [facility]. There was adequate/complete clearance through the upper cervical Esophagus. An Esophageal sweep was not performed. A 13 mm barium tablet was not given during this study. Presence of the NGT noted. Pt was educated on the results of the study immediately after; video viewed and questions answered. Factors that may increase risk of adverse event in presence of aspiration [name]): Factors that may increase risk of adverse event in presence of aspiration [name]): -- (none) Recommendations/Plan: Swallowing Evaluation Recommendations Swallowing Evaluation Recommendations Recommendations: PO diet PO Diet Recommendation: Regular; Thin liquids (Level 0) Liquid Administration via: Cup; Straw Medication Administration: Whole meds with liquid (vs Whole in Puree if easier) Supervision: Patient able to self-feed; Set-up assistance for safety (d/t overall weakness) Swallowing strategies  : Minimize environmental distractions; Slow rate; Small bites/sips Postural changes: Position pt fully upright for meals; Stay upright 30-60 min after meals; Out of bed for meals Oral care recommendations: Oral care BID (2x/day); Pt independent with oral care (setup) Recommended consults: Consider dietitian consultation Treatment Plan Treatment Plan Treatment recommendations: No treatment recommended at this time Follow-up recommendations: No SLP follow up Recommendations Comment: n/a Functional status assessment: Patient has had a recent decline in their functional status and demonstrates the ability to make  significant improvements in function in a reasonable and predictable amount of time. Treatment frequency: -- (n/a) Treatment duration: -- (n/a) Interventions: Aspiration precaution training; Patient/family education Recommendations Recommendations for follow up therapy are one component of a multi-disciplinary discharge planning process, led by the attending physician.  Recommendations may be updated based on patient status, additional functional criteria and insurance authorization. 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) Boluses Administered: Boluses Administered Boluses Administered: Thin liquids (Level 0); Mildly thick liquids (Level 2, nectar thick); Moderately thick liquids (Level 3, honey thick); Puree; Solid  Oral Impairment Domain: Oral Impairment Domain Lip Closure: No labial escape Tongue control during bolus hold: Cohesive bolus between tongue to palatal seal Bolus preparation/mastication: Timely and efficient chewing and mashing Bolus transport/lingual motion: Brisk tongue motion Oral residue: Complete oral clearance Location of oral residue : N/A Initiation of pharyngeal swallow : Posterior angle of the ramus (BOT>Valleculae)  Pharyngeal Impairment Domain: Pharyngeal Impairment Domain Soft palate elevation: No bolus between soft palate (SP)/pharyngeal wall (PW) Laryngeal elevation: Complete superior movement of thyroid  cartilage with complete approximation of arytenoids to epiglottic petiole Anterior hyoid excursion: Complete anterior movement Epiglottic movement: Complete inversion Laryngeal vestibule closure: Complete, no air/contrast in laryngeal vestibule Pharyngeal stripping wave : Present - complete Pharyngeal contraction (A/P view only): N/A Pharyngoesophageal segment opening:  Complete distension and complete duration, no obstruction of flow Tongue base retraction: No contrast between tongue base and posterior pharyngeal wall (PPW) Pharyngeal residue: Complete pharyngeal clearance Location of pharyngeal residue: N/A  Esophageal Impairment Domain: Esophageal Impairment Domain Esophageal clearance upright position: Complete clearance, esophageal coating Pill: Pill Consistency administered: -- (n/a) Penetration/Aspiration Scale Score: Penetration/Aspiration Scale Score 1.  Material does not enter airway: Thin liquids (Level 0); Mildly thick liquids (Level 2, nectar thick); Moderately thick liquids (Level 3, honey thick); Puree; Solid Compensatory Strategies: Compensatory Strategies Compensatory strategies: No   General Information: Caregiver present: No  Diet Prior to this  Study: NPO; Cortrak/Small bore NG tube   Temperature : Normal   Respiratory Status: WFL   Supplemental O2: Nasal cannula (2-4L)   History of Recent Intubation: Yes  Behavior/Cognition: Alert; Cooperative; Pleasant mood; Distractible; Requires cueing; Confused (intermittently; mild confusion) Self-Feeding Abilities: Able to self-feed; Needs set-up for self-feeding Baseline vocal quality/speech: Normal Volitional Cough: Able to elicit Volitional Swallow: Able to elicit Exam Limitations: No limitations Goal Planning: Prognosis for improved oropharyngeal function: Good Barriers to Reach Goals: -- (hospitalization) Barriers/Prognosis Comment: suspect impact from lengthy illness/intubation/hospitalization; Polysubstance abuse/use; severely deconditioned and weak; Pulmonary decline Patient/Family Stated Goal: to have a meal Consulted and agree with results and recommendations: Patient; Physician; Nurse; Dietitian Pain: Pain Assessment Pain Assessment: Faces Pain Score: 9 Faces Pain Scale: 4 Pain Location: back Pain Descriptors / Indicators: Aching; Discomfort; Grimacing; Guarding Pain Intervention(s): Monitored during session;  Premedicated before session; Repositioned; Patient requesting pain meds-RN notified End of Session: Start Time:SLP Start Time (ACUTE ONLY): 0815 Stop Time: SLP Stop Time (ACUTE ONLY): 0915 Time Calculation:SLP Time Calculation (min) (ACUTE ONLY): 60 min Charges: SLP Evaluations $ SLP Speech Visit: 1 Visit SLP Evaluations $MBS Swallow: 1 Procedure $Swallowing Treatment: 1 Procedure SLP visit diagnosis: SLP Visit Diagnosis: Dysphagia, unspecified (R13.10) Past Medical History: Past Medical History: Diagnosis Date  Alcohol  abuse 11/02/2018  Hypertension   Seizure [facility])   sees dr maree at [facility], no longer has them. 9 years ago. Past Surgical History: Past Surgical History: Procedure Laterality Date  CARPAL TUNNEL RELEASE Right 04/11/2021  Procedure: CARPAL TUNNEL RELEASE ENDOSCOPIC;  Surgeon: Edie Norleen PARAS, MD;  Location: [facility] ORS;  Service: Orthopedics;  Laterality: Right;  Pateint requesting 1st case of the day  CERVICAL SPINE SURGERY  2019  CHOLECYSTECTOMY   [name], MS, [name] Speech Language Pathologist Rehab Services; [facility] [place] [phone] (ascom) Watson,Katherine 07/13/2024, 3:11 PM  DG Abd 1 View Result Date: 07/10/2024 CLINICAL DATA:  Feeding tube placement. EXAM: ABDOMEN - 1 VIEW COMPARISON:  06/27/2024 FINDINGS: Tip of the weighted enteric tube is in the left upper abdomen in the region of the gastric body. No bowel dilatation in the included abdomen. Right upper quadrant surgical clips. IMPRESSION: Tip of the weighted enteric tube in the left upper abdomen in the region of the gastric body. Electronically Signed   By: Andrea Gasman M.D.   On: 07/10/2024 17:27   Rapid EEG Result Date: 07/10/2024 Shelton Arlin KIDD, MD     07/10/2024  8:31 PM Patient Name: SUMMIT BORCHARDT MRN: [id number] Epilepsy Attending: Arlin KIDD Shelton Referring Physician/Provider: Kathrene Almarie Bake, NP Duration: 07/10/2024 9391 to 1002 Patient history: 44 yo F with sudden onset episodes of unresponsiveness  that lasted 30 seconds, one observed by me. Patient unresponsive and was confused after. EEG to evaluate for seizure Level of alertness: Awake AEDs during EEG study: LEV, Ativan  Technical aspects: This EEG was obtained using a 10 lead EEG system positioned circumferentially without any parasagittal coverage (rapid EEG). Computer selected EEG is reviewed as  well as background features and all clinically significant events. Description: The posterior dominant rhythm consists of 10 Hz activity of moderate voltage (25-35 uV) seen predominantly in posterior head regions, symmetric and reactive to eye opening and eye closing. There is an excessive amount of 13-15 Hz distributed symmetrically and diffusely. Hyperventilation and photic stimulation were not performed.   ABNORMALITY - Excessive beta, generalized IMPRESSION: This limited ceribell eeg is within normal limits. No seizures or epileptiform discharges were seen throughout the recording. A normal  interictal EEG does not exclude the diagnosis of epilepsy. Arlin MALVA Krebs   DG Chest Port 1 View Result Date: 07/09/2024 EXAM: 1 VIEW(S) XRAY OF THE CHEST 07/09/2024 07:13:00 PM COMPARISON: 07/07/2024 CLINICAL HISTORY: Respiratory failure with hypoxia (HCC) FINDINGS: LINES, TUBES AND DEVICES: Right PICC tip in right atrium. Endotracheal tube and enteric tube removed. LUNGS AND PLEURA: Interval extubation. Progressively low lung volumes with bronchovascular crowding. Improving peripheral right mid lung zone consolidation. No pleural effusion. No pneumothorax. HEART AND MEDIASTINUM: No acute abnormality of the cardiac and mediastinal silhouettes. BONES AND SOFT TISSUES: Partially imaged cervical fixation hardware noted. No acute osseous abnormality. IMPRESSION: 1. Improving peripheral right mid lung zone consolidation. 2. Progressively low lung volumes with bronchovascular crowding. 3. Right PICC tip in the right atrium. Electronically signed by: Dorethia Molt MD  07/09/2024 09:15 PM EST RP Workstation: [id number]   US  EKG SITE RITE Result Date: 07/07/2024 If Site Rite image not attached, placement could not be confirmed due to current cardiac rhythm.  DG Chest Port 1 View Result Date: 07/07/2024 CLINICAL DATA:  Acute respiratory failure. EXAM: PORTABLE CHEST 1 VIEW COMPARISON:  r 07/05/2024 FINDINGS: Endotracheal tube tip is 1.8 cm above the base of the carina. The NG tube passes into the stomach although the distal tip position is not included on the film. Low volume film with progressing airspace disease in the right mid lung and right base. Subtle patchy atelectasis or infiltrate noted left base. Left IJ central line tip overlies the upper right atrium. Small right pleural effusion evident. Telemetry leads overlie the chest. IMPRESSION: 1. Low volume film with progressing airspace disease in the right mid lung and right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 (EUA). This EUA will remain in effect  (meaning this test can be used) for the duration of the COVID-19 declaration under Section 564(b)(1) of the Act, 21 U.S.C. section 360bbb-3(b)(1), unless the authorization is terminated or revoked.  Performed at [facility], [address] [place], [place] [address]      Labs: Basic Metabolic Panel: Recent Labs  Lab 07/09/24 0420 07/10/24 0031 07/10/24 0430 07/10/24 1652 07/11/24 0331 07/11/24 1807 07/13/24 0953  NA 141 146* 149* 138 147* 146* 141  K 3.8 3.6 3.6 3.3* 3.4* 3.7 4.2  CL 103 105 106 101 110 109 102  CO2 28 23 23  20* 27 26 28   GLUCOSE 108* 112* 101* 373* 129* 132* 104*  BUN 22* 21* 23* 20 19 17 15   CREATININE 0.39* 0.41* 0.40* 0.36* 0.37* 0.40* 0.45  CALCIUM  9.3 9.6 9.7 8.7* 9.8 9.7 9.7  MG  --  1.7 1.7  --  1.9  --  1.8  PHOS 5.0*  --  3.7  --  3.8  --  4.0   Liver Function Tests: Recent Labs  Lab 07/09/24 0420 07/10/24 0430 07/11/24 0331  ALBUMIN 3.3* 3.5 3.5   No results for input(s): LIPASE, AMYLASE in the last 168 hours. No results for input(s): AMMONIA in the last 168 hours. CBC: Recent Labs  Lab 07/09/24 0420 07/10/24 0430 07/11/24 0331 07/13/24 0953  WBC 13.2* 16.6* 13.9* 8.1  HGB 8.7* 9.6* 9.2* 9.9*  HCT 27.7* 30.5* 29.5* 30.1*  MCV 96.2 97.4 97.0 93.5  PLT 663* 885* 776* 541*   Cardiac Enzymes: No results for input(s): CKTOTAL, CKMB, CKMBINDEX, TROPONINI in the last 168 hours. BNP: BNP (last 3 results) No results for input(s): BNP in the last 8760 hours.  ProBNP (last 3 results) No results for input(s): PROBNP in the last 8760 hours.  CBG: Recent Labs  Lab 07/13/24 0804 07/13/24 1624 07/13/24 2142 07/14/24 0754 07/14/24 1119  GLUCAP 160* 145* 98 111* 95       Signed:  Devaughn KATHEE Ban MD.  [facility] 07/15/2024, 4:16 PM     [1]  Allergies Allergen Reactions   Varenicline  Tartrate Other (See Comments)  varenicline    Wellbutrin  [Bupropion ]     Suicidal thoughts

## 2024-07-15 NOTE — TOC Progression Note (Signed)
 Transition of Care [facility]) - Progression Note    Patient Details  Name: [name] Phillips MRN: [id number] Date of Birth: 04-08-1981  [facility] CM/SW Contact  Daved JONETTA Hamilton, RN Phone Number: 07/15/2024, 3:29 PM  Clinical Narrative:     Spoke with patient, introduced self and explained role. Discussed with patient about ordered DME, rolling walker and New Home O2. Patient verbalized she has two rolling walkers so no need for that order. Patient verbalized she wants the home O2 and is willing to discuss out of pocket cost with [employer], O2 order has been sent to [facility]. Provider anticipates patient medically ready for discharge today so this information was included in the communication to [employer] with request on how quickly Home O2 could be set up. This CM verbally confirmed with the patient that the phone number and address on file for her is correct and current.                      Expected Discharge Plan and Services                                               Social Drivers of Health (SDOH) Interventions SDOH Screenings   Food Insecurity: No Food Insecurity (06/27/2024)  Housing: Low Risk (06/27/2024)  Transportation Needs: No Transportation Needs (06/27/2024)  Utilities: Not At Risk (06/27/2024)  Depression (PHQ2-9): High Risk (10/29/2023)  Tobacco Use: Medium Risk (06/27/2024)    Readmission Risk Interventions    07/06/2024    3:19 PM  Readmission Risk Prevention Plan  Transportation Screening Complete  PCP or Specialist Appt within 3-5 Days Complete  HRI or Home Care Consult Complete  Social Work Consult for Recovery Care Planning/Counseling Complete  Palliative Care Screening Not Applicable  Medication Review Oceanographer) Complete

## 2024-07-15 NOTE — TOC Transition Note (Signed)
 Transition of Care [facility]) - Discharge Note   Patient Details  Name: [name] Phillips MRN: [id number] Date of Birth: [birth year]  Transition of Care [facility]) CM/SW Contact:  Daved JONETTA Hamilton, RN Phone Number: 07/15/2024, 4:21 PM   Clinical Narrative:     Patient will DC to: Home with [facility]. [facility] New Home O2 order has been sent. Anticipated DC date: 07/15/2024 Family notified: Patient to notify Transport by: [place] spouse  Per MD patient ready for DC to Home w/ HH. RN, patient, patient's family, and agency notified of DC. Order sent to agency in [place].   TOC signing off.   Final next level of care: Home w Home Health Services Barriers to Discharge: Barriers Resolved   Patient Goals and CMS Choice            Discharge Placement                    Patient and family notified of of transfer: 07/15/24  Discharge Plan and Services Additional resources added to the After Visit Summary for                  DME Arranged: Oxygen DME Agency: [facility] Date DME Agency Contacted: 07/15/24 Time DME Agency Contacted: 1527 Representative spoke with at DME Agency: Email process for [facility] Arranged: PT, OT HH Agency: [facility] Date HH Agency Contacted: 07/15/24 Time HH Agency Contacted: 1621 Representative spoke with at [facility] Agency: Lauren  Social Drivers of Health (SDOH) Interventions SDOH Screenings   Food Insecurity: No Food Insecurity (06/27/2024)  Housing: Low Risk (06/27/2024)  Transportation Needs: No Transportation Needs (06/27/2024)  Utilities: Not At Risk (06/27/2024)  Depression (PHQ2-9): High Risk (10/29/2023)  Tobacco Use: Medium Risk (06/27/2024)     Readmission Risk Interventions    07/06/2024    3:19 PM  Readmission Risk Prevention Plan  Transportation Screening Complete  PCP or Specialist Appt within 3-5 Days Complete  HRI or Home Care Consult Complete  Social Work Consult for Recovery Care Planning/Counseling Complete   Palliative Care Screening Not Applicable  Medication Review Oceanographer) Complete

## 2024-07-15 NOTE — Plan of Care (Signed)
" °  Problem: Education: Goal: Knowledge of General Education information will improve Description: Including pain rating scale, medication(s)/side effects and non-pharmacologic comfort measures Outcome: Progressing   Problem: Health Behavior/Discharge Planning: Goal: Ability to manage health-related needs will improve Outcome: Progressing   Problem: Clinical Measurements: Goal: Ability to maintain clinical measurements within normal limits will improve Outcome: Progressing Goal: Will remain free from infection Outcome: Progressing Goal: Diagnostic test results will improve Outcome: Progressing Goal: Respiratory complications will improve Outcome: Progressing Goal: Cardiovascular complication will be avoided Outcome: Progressing   Problem: Nutrition: Goal: Adequate nutrition will be maintained Outcome: Progressing   Problem: Activity: Goal: Risk for activity intolerance will decrease Outcome: Progressing   Problem: Coping: Goal: Level of anxiety will decrease Outcome: Progressing   Problem: Elimination: Goal: Will not experience complications related to bowel motility Outcome: Progressing Goal: Will not experience complications related to urinary retention Outcome: Progressing   Problem: Skin Integrity: Goal: Risk for impaired skin integrity will decrease Outcome: Progressing   Problem: Activity: Goal: Ability to tolerate increased activity will improve Outcome: Progressing   Problem: Clinical Measurements: Goal: Ability to maintain a body temperature in the normal range will improve Outcome: Progressing   Problem: Respiratory: Goal: Ability to maintain adequate ventilation will improve Outcome: Progressing Goal: Ability to maintain a clear airway will improve Outcome: Progressing   Problem: Activity: Goal: Ability to tolerate increased activity will improve Outcome: Progressing   Problem: Role Relationship: Goal: Method of communication will  improve Outcome: Progressing   Problem: Respiratory: Goal: Ability to maintain a clear airway and adequate ventilation will improve Outcome: Progressing   Problem: Education: Goal: Ability to describe self-care measures that may prevent or decrease complications (Diabetes Survival Skills Education) will improve Outcome: Progressing   Problem: Coping: Goal: Ability to adjust to condition or change in health will improve Outcome: Progressing   Problem: Fluid Volume: Goal: Ability to maintain a balanced intake and output will improve Outcome: Progressing   Problem: Health Behavior/Discharge Planning: Goal: Ability to identify and utilize available resources and services will improve Outcome: Progressing Goal: Ability to manage health-related needs will improve Outcome: Progressing   Problem: Metabolic: Goal: Ability to maintain appropriate glucose levels will improve Outcome: Progressing   Problem: Nutritional: Goal: Maintenance of adequate nutrition will improve Outcome: Progressing Goal: Progress toward achieving an optimal weight will improve Outcome: Progressing   Problem: Skin Integrity: Goal: Risk for impaired skin integrity will decrease Outcome: Progressing   Problem: Tissue Perfusion: Goal: Adequacy of tissue perfusion will improve Outcome: Progressing   "

## 2024-07-15 NOTE — Progress Notes (Signed)
 Physical Therapy Treatment Patient Details Name: [name] Phillips MRN: [id number] DOB: 03/16/1981 Today's Date: 07/15/2024   History of Present Illness Patient is a 44 year old female with acute hypoxic respiratory failure, severe strep pneumo pneumonia, strep pneumo bacteremia, septic shock. History of polysubstance and IV drug use, on suboxone  prior to presentation.  Extubated 07/08/24    PT Comments  Modified session due to pt desating from 93% down to 86% after transitioning supine to sit and unable to recover to 90's on RA. MD aware and ordered Chest CT to r/o PE, therefore further mobility deferred. Did discuss d/c with pt and husband who feel comfortable with returning home with HHPT. Husband has been assisting pt to/from bathroom. Pt has 2 Rw's at home and purchased a pulse Ox. Will continue to follow acutely.    If plan is discharge home, recommend the following: A little help with bathing/dressing/bathroom;A little help with walking and/or transfers   Can travel by private vehicle        Equipment Recommendations  None recommended by PT (Pt has 2 RW at home)    Recommendations for Other Services       Precautions / Restrictions Precautions Precautions: Fall Recall of Precautions/Restrictions: Impaired Precaution/Restrictions Comments: watch O2, HR, BP Restrictions Weight Bearing Restrictions Per Provider Order: No     Mobility  Bed Mobility Overal bed mobility: Modified Independent Bed Mobility: Supine to Sit     Supine to sit: Modified independent (Device/Increase time) Sit to supine: Modified independent (Device/Increase time)        Transfers Overall transfer level: Needs assistance Equipment used: Rolling walker (2 wheels) Transfers: Sit to/from Stand Sit to Stand: Supervision, Contact guard assist           General transfer comment:  (Standing only to move closer to [facility] prior to lying back down)    Ambulation/Gait               General Gait  Details:  (Held due to r/o for PE)   Stairs             Wheelchair Mobility     Tilt Bed    Modified Rankin (Stroke Patients Only)       Balance Overall balance assessment: Needs assistance Sitting-balance support: Feet supported Sitting balance-Leahy Scale: Good     Standing balance support: Bilateral upper extremity supported, During functional activity, Reliant on assistive device for balance Standing balance-Leahy Scale: Good Standing balance comment: weakness, dizziness in standing. Poor tolerance.                            Communication Communication Communication: No apparent difficulties  Cognition Arousal: Alert Behavior During Therapy: WFL for tasks assessed/performed   PT - Cognitive impairments: Problem solving, Sequencing                       PT - Cognition Comments: Patient requires repeated cues at times for processing, sequencing. Following commands: Impaired Following commands impaired: Follows one step commands with increased time    Cueing Cueing Techniques: Verbal cues  Exercises Other Exercises Other Exercises:  (Discussed d/c needs with pt and pt's husband. Both feel comfortable with pt returning home once medically cleared. Husband has been helping her to/from bathroom)    General Comments General comments (skin integrity, edema, etc.):  (SpO2 at rest lying down at 93% on RA, upon sitting EOB pt desated to 86%)  Pertinent Vitals/Pain Pain Assessment Pain Assessment: No/denies pain    Home Living                          Prior Function            PT Goals (current goals can now be found in the care plan section) Acute Rehab PT Goals Patient Stated Goal: to be able to get up and go home    Frequency    Min 3X/week      PT Plan      Co-evaluation              AM-PAC PT 6 Clicks Mobility   Outcome Measure  Help needed turning from your back to your side while in a flat  bed without using bedrails?: None Help needed moving from lying on your back to sitting on the side of a flat bed without using bedrails?: None Help needed moving to and from a bed to a chair (including a wheelchair)?: A Little Help needed standing up from a chair using your arms (e.g., wheelchair or bedside chair)?: A Little Help needed to walk in hospital room?: A Little Help needed climbing 3-5 steps with a railing? : A Lot 6 Click Score: 19    End of Session Equipment Utilized During Treatment: Gait belt Activity Tolerance: Treatment limited secondary to medical complications (Comment) (CT of chest to r/o PE) Patient left: in bed;with call bell/phone within reach;with bed alarm set Nurse Communication: Mobility status PT Visit Diagnosis: Muscle weakness (generalized) (M62.81)     Time: 8776-8765 PT Time Calculation (min) (ACUTE ONLY): 11 min  Charges:    $Therapeutic Activity: 8-22 mins PT General Charges $$ ACUTE PT VISIT: 1 Visit                    [name], PTA  Darice JAYSON Bohr 07/15/2024, 1:33 PM

## 2024-07-16 ENCOUNTER — Ambulatory Visit: Payer: Self-pay | Admitting: Family

## 2024-07-16 ENCOUNTER — Telehealth: Payer: Self-pay

## 2024-07-16 LAB — SYPHILIS: RPR W/REFLEX TO RPR TITER AND TREPONEMAL ANTIBODIES, TRADITIONAL SCREENING AND DIAGNOSIS ALGORITHM: RPR Ser Ql: NONREACTIVE

## 2024-07-16 LAB — GLUCOSE, CAPILLARY
Glucose-Capillary: 85 mg/dL (ref 70–99)
Glucose-Capillary: 96 mg/dL (ref 70–99)
Glucose-Capillary: 157 mg/dL — ABNORMAL HIGH (ref 70–99)
Glucose-Capillary: 161 mg/dL — ABNORMAL HIGH (ref 70–99)
Glucose-Capillary: 106 mg/dL — ABNORMAL HIGH (ref 70–99)

## 2024-07-16 NOTE — Transitions of Care (Post Inpatient/ED Visit) (Signed)
" ° °  07/16/2024  Name: [name] Phillips MRN: [id number] DOB: [birth year]  Today's TOC FU Call Status: Today's TOC FU Call Status:: Unsuccessful Call (1st Attempt) Unsuccessful Call (1st Attempt) Date: 07/16/24  Attempted to reach the patient regarding the most recent Inpatient/ED visit.  Follow Up Plan: Additional outreach attempts will be made to reach the patient to complete the Transitions of Care (Post Inpatient/ED visit) call.    Bing Edison MSN, RN RN Case Sales [employer]  [facility] Office Hours M-F [phone] Direct Dial: [phone] Main Phone [phone]  Fax: [phone] [email or web address]    "

## 2024-07-19 ENCOUNTER — Telehealth: Payer: Self-pay

## 2024-07-19 NOTE — Transitions of Care (Post Inpatient/ED Visit) (Signed)
" ° °  07/19/2024  Name: [name] Phillips MRN: [id number] DOB: 1980-10-08  Today's TOC FU Call Status: Today's TOC FU Call Status:: Unsuccessful Call (2nd Attempt) Unsuccessful Call (2nd Attempt) Date: 07/19/24  Attempted to reach the patient regarding the most recent Inpatient/ED visit.  Follow Up Plan: Additional outreach attempts will be made to reach the patient to complete the Transitions of Care (Post Inpatient/ED visit) call.    Bing Edison MSN, RN RN Case Sales [employer]  [facility] Office Hours M-F [phone] Direct Dial: [phone] Main Phone [phone]  Fax: [phone] [email or web address]    "

## 2024-07-22 ENCOUNTER — Encounter: Payer: Self-pay | Admitting: Family

## 2024-07-22 ENCOUNTER — Ambulatory Visit: Admitting: Family

## 2024-07-22 ENCOUNTER — Other Ambulatory Visit: Payer: Self-pay | Admitting: Family

## 2024-07-22 VITALS — BP 118/76 | HR 97 | Temp 98.2°F | Wt 139.4 lb

## 2024-07-22 DIAGNOSIS — K760 Fatty (change of) liver, not elsewhere classified: Secondary | ICD-10-CM

## 2024-07-22 DIAGNOSIS — R5381 Other malaise: Secondary | ICD-10-CM

## 2024-07-22 DIAGNOSIS — J154 Pneumonia due to other streptococci: Secondary | ICD-10-CM

## 2024-07-22 DIAGNOSIS — F1110 Opioid abuse, uncomplicated: Secondary | ICD-10-CM

## 2024-07-22 DIAGNOSIS — R7689 Other specified abnormal immunological findings in serum: Secondary | ICD-10-CM

## 2024-07-22 LAB — CBC WITH DIFFERENTIAL/PLATELET
Basophils Absolute: 0.1 K/uL (ref 0.0–0.1)
Basophils Relative: 0.6 % (ref 0.0–3.0)
Eosinophils Absolute: 0.1 K/uL (ref 0.0–0.7)
Eosinophils Relative: 0.9 % (ref 0.0–5.0)
HCT: 39.1 % (ref 36.0–46.0)
Hemoglobin: 13.3 g/dL (ref 12.0–15.0)
Lymphocytes Relative: 18.9 % (ref 12.0–46.0)
Lymphs Abs: 1.8 K/uL (ref 0.7–4.0)
MCHC: 34 g/dL (ref 30.0–36.0)
MCV: 89.9 fl (ref 78.0–100.0)
Monocytes Absolute: 0.7 K/uL (ref 0.1–1.0)
Monocytes Relative: 7.7 % (ref 3.0–12.0)
Neutro Abs: 6.8 K/uL (ref 1.4–7.7)
Neutrophils Relative %: 71.9 % (ref 43.0–77.0)
Platelets: 419 K/uL — ABNORMAL HIGH (ref 150.0–400.0)
RBC: 4.35 Mil/uL (ref 3.87–5.11)
RDW: 16.4 % — ABNORMAL HIGH (ref 11.5–15.5)
WBC: 9.4 K/uL (ref 4.0–10.5)

## 2024-07-22 LAB — COMPREHENSIVE METABOLIC PANEL WITH GFR
ALT: 21 U/L (ref 3–35)
AST: 22 U/L (ref 5–37)
Albumin: 4.6 g/dL (ref 3.5–5.2)
Alkaline Phosphatase: 171 U/L — ABNORMAL HIGH (ref 39–117)
BUN: 13 mg/dL (ref 6–23)
CO2: 31 meq/L (ref 19–32)
Calcium: 10.2 mg/dL (ref 8.4–10.5)
Chloride: 95 meq/L — ABNORMAL LOW (ref 96–112)
Creatinine, Ser: 0.93 mg/dL (ref 0.40–1.20)
GFR: 74.25 mL/min
Glucose, Bld: 149 mg/dL — ABNORMAL HIGH (ref 70–99)
Potassium: 3.4 meq/L — ABNORMAL LOW (ref 3.5–5.1)
Sodium: 135 meq/L (ref 135–145)
Total Bilirubin: 0.4 mg/dL (ref 0.2–1.2)
Total Protein: 8.1 g/dL (ref 6.0–8.3)

## 2024-07-22 NOTE — Progress Notes (Unsigned)
 "  Established Patient Office Visit  Subjective:      CC:  Chief Complaint  Patient presents with   Hospitalization Follow-up    [facility] 06/27/24 - 07/15/24    HPI: [name] Phillips is a 44 y.o. female presenting on 07/22/2024 for Hospitalization Follow-up [facility] 06/27/24 - 07/15/24) .  Discussed the use of AI scribe software for clinical note transcription with the patient, who gave verbal consent to proceed.  History of Present Illness [name] Phillips is a 44 year old female who presents for follow-up after hospitalization for respiratory failure and pneumonia.  She was hospitalized from december 14th to January 1st after experiencing progressively worsening shortness of breath and hypoxia. Initially, she presented with a high fever and worsening respiratory symptoms. Upon admission, she was found to have right-sided pneumonia and respiratory failure, and she required intubation and mechanical ventilation. She was treated with antibiotics including Rocephin  and Zithromax , and later antibiotics to cover Pseudomonas and MRSA. She was extubated on December 25th and has since been weaned off oxygen.  During her hospitalization, she experienced septic shock and acute kidney injury. She required an NG tube for feeding during her hospitalization, which has since been removed as she is now able to eat by mouth.  She has a history of chronic hepatitis C and seizure disorder. She is currently taking Keppra  500 mg twice daily. She experiences lethargy and difficulty functioning while taking both Keppra  and lamotrigine . She reports having four small episodes during her hospital stay that were described as seizures by the hospital staff.  She has a history of fentanyl  and heroin use but was detoxed during her hospital stay. She is not planning to restart Suboxone . She has a strong support system at home.  She experiences shortness of breath with exertion but returns to baseline after a few minutes of  rest. She is working on regaining strength and mobility with the help of family support.  Her social history includes a supportive partner and family. She has been working on physical rehabilitation at home due to insurance limitations. She has been using a flutter valve and incentive spirometer as part of her recovery.         Social history:  Relevant past medical, surgical, family and social history reviewed and updated as indicated. Interim medical history since our last visit reviewed.  Allergies and medications reviewed and updated.  DATA REVIEWED: CHART IN EPIC     ROS: Negative unless specifically indicated above in HPI.   Current Medications[1]        Objective:        BP 118/76 (BP Location: Left Arm, Patient Position: Sitting, Cuff Size: Normal)   Pulse 97   Temp 98.2 F (36.8 C) (Temporal)   Wt 139 lb 6.1 oz (63.2 kg)   LMP 06/08/2024 (Approximate)   SpO2 92%   BMI 23.91 kg/m   Physical Exam HEENT: No thrush in oropharynx. CHEST: Lungs clear to auscultation.  Wt Readings from Last 3 Encounters:  07/22/24 139 lb 6.1 oz (63.2 kg)  07/12/24 151 lb 10.8 oz (68.8 kg)  10/29/23 174 lb (78.9 kg)    Physical Exam Vitals reviewed.  Constitutional:      General: She is not in acute distress.    Appearance: Normal appearance. She is normal weight. She is not ill-appearing, toxic-appearing or diaphoretic.  HENT:     Head: Normocephalic.     Right Ear: Tympanic membrane normal.     Left Ear: Tympanic membrane  normal.     Nose: Nose normal.     Mouth/Throat:     Mouth: Mucous membranes are dry.     Pharynx: No oropharyngeal exudate or posterior oropharyngeal erythema.  Eyes:     Extraocular Movements: Extraocular movements intact.     Pupils: Pupils are equal, round, and reactive to light.  Cardiovascular:     Rate and Rhythm: Normal rate and regular rhythm.     Pulses: Normal pulses.     Heart sounds: Normal heart sounds.  Pulmonary:      Effort: Pulmonary effort is normal.     Breath sounds: Normal breath sounds.  Musculoskeletal:     Cervical back: Normal range of motion.  Neurological:     General: No focal deficit present.     Mental Status: She is alert and oriented to person, place, and time. Mental status is at baseline.  Psychiatric:        Mood and Affect: Mood normal.        Behavior: Behavior normal.        Thought Content: Thought content normal.        Judgment: Judgment normal.          Results Labs WBC (07/13/2024): Within normal limits, decreased from 20.8 on 04/27/2024 D-dimer (07/13/2024): Slightly elevated BMP (07/13/2024): Within normal limits Hemoglobin (06/2024): Anemia throughout hospitalization Creatinine (06/2024): Acute kidney injury during hospitalization, returned to normal at discharge; previously 2.05 on 04/27/2024 Blood cultures (04/2024): Positive for Streptococcus pneumoniae Lactic acid (04/27/2024): 2.4 Sodium (04/27/2024): 133 Potassium (04/27/2024): 3.1 Hepatitis C antibody (2022): Antibody positive, no active infection  Radiology Chest X-ray (04/27/2024): Right lung volume loss with widening, scattered infiltrates in left lung Chest X-ray (07/01/2024): Worsening infiltrates, severe hypoxemia, mucoid secretions CT angiogram chest (07/15/2024): No pulmonary embolism. Improved right lung consolidation with persistent diffuse airspace opacities in right lung and left upper lobe, patchy peribronchial airspace opacities in left base. No pleural effusion. Resolving infection.  Assessment & Plan:   Assessment and Plan Assessment & Plan Recovery from severe streptococcal pneumonia with respiratory failure and sepsis Severe streptococcal pneumonia with respiratory failure and sepsis, requiring intubation and mechanical ventilation. Extubated on December 25th, 2024. Currently experiencing residual shortness of breath, likely due to deconditioning. No current antibiotics prescribed  post-discharge. Recent CT scan showed improved consolidation of the right lung with persistent diffuse airspace opacities. - Will repeat chest x-ray in four weeks to assess lung status. - Monitor for signs of worsening respiratory status, such as increased shortness of breath or fever. - Repeated blood work to check for anemia and kidney function.  Seizure disorder Recent episodes during hospitalization. Currently on Keppra  500 mg twice daily and lamotrigine  25 mg daily. Experiencing lethargy and functional impairment, likely due to medication regimen. Neurology follow-up pending. - Discontinued lamotrigine  until neurology follow-up. - Continue Keppra  500 mg twice daily. - Contact Dr. Orlando office for neurology follow-up. - Monitor for any abnormal rash or worsening symptoms.  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 Recently detoxed and not planning to restart Suboxone . Acknowledges need for support and is exploring options for recovery support. - Referred to child psychotherapist for resources on NA and Al-Anon. - Encouraged participation in NA meetings and finding a sponsor. - Discussed the importance of a support system and accountability.  Physical deconditioning post-critical illness Significant physical deconditioning following critical illness and prolonged hospitalization. Experiencing weakness and difficulty with mobility. - Encouraged gradual increase in physical activity as tolerated. -  Consider use of a cane for stability at home. - Encouraged use of incentive spirometer multiple times a day.  Chronic hepatitis C infection Positive antibody but no active infection. Previous liver scan rescheduled. - Follow up with hepatology for liver evaluation.  General Health Maintenance Discussion of lifestyle modifications and support systems for recovery. - Encouraged participation in support groups and finding a sponsor. - Discussed the importance of a  support system and accountability.        Return in about 3 weeks (around 08/12/2024) for f/u pneumonia.     Ginger Patrick, MSN, APRN, FNP-C [facility]        [1]  Current Outpatient Medications:    atorvastatin  (LIPITOR) 10 MG tablet, Take 1 tablet (10 mg total) by mouth daily. MUST HAVE OV FOR FURTHER REFILLS, Disp: 90 tablet, Rfl: 3   Buprenorphine  HCl-Naloxone  HCl 2-0.5 MG FILM, Place 0.5 Film under the tongue daily., Disp: , Rfl:    cetirizine (ZYRTEC) 10 MG tablet, Take 10 mg by mouth daily., Disp: , Rfl:    lamoTRIgine  (LAMICTAL ) 25 MG tablet, 1 tab nightly until 1/10. Then 1 tab twice a day for 2 weeks. Then 1 tab in the morning and 2 at night for 1 week. Then 2 tabs in the morning and 3 at night for 1 week. Then 3 tabs in the morning and 4 at night for 1 week. Then 4 tabs in the morning and 5 at night., Disp: 270 tablet, Rfl: 1   levETIRAcetam  (KEPPRA  XR) 500 MG 24 hr tablet, Take 1 tablet (500 mg total) by mouth 2 (two) times daily., Disp: 60 tablet, Rfl: 1   Melatonin 12 MG TABS, Take 3 mg by mouth at bedtime., Disp: , Rfl:    Multiple Vitamin (MULTI-VITAMIN) tablet, Take 1 tablet by mouth daily., Disp: , Rfl:    omeprazole  (PRILOSEC) 20 MG capsule, Take 1 capsule (20 mg total) by mouth daily., Disp: 30 capsule, Rfl: 1   pyridOXINE  (VITAMIN B6) 25 MG tablet, Take 1 tablet (25 mg total) by mouth daily., Disp: 90 tablet, Rfl: 1   sertraline  (ZOLOFT ) 100 MG tablet, Take two tablets once daily, Disp: 180 tablet, Rfl: 1   bisoprolol -hydrochlorothiazide  (ZIAC ) 5-6.25 MG tablet, TAKE ONE TABLET BY MOUTH ONCE DAILY, Disp: 90 tablet, Rfl: 3  "

## 2024-07-22 NOTE — Patient Instructions (Signed)
" ° °  Maree Jannett Hering, MD  [phone] [facility] [address]  [facility]  [place], [place] [address]  [phone]  [phone] (Fax)  "

## 2024-07-27 ENCOUNTER — Telehealth: Payer: Self-pay

## 2024-07-27 NOTE — Progress Notes (Signed)
 Complex Care Management Note Care Guide Note  07/27/2024 Name: [name] Phillips MRN: [id number] DOB: 1981-02-28   Complex Care Management Outreach Attempts: An unsuccessful telephone outreach was attempted today to offer the patient information about available complex care management services.  Follow Up Plan:  Additional outreach attempts will be made to offer the patient complex care management information and services.   Encounter Outcome:  No Answer  Dream[facility]  [facility], [facility] VBCI Assistant Direct Dial: [phone]  Fax: [phone]

## 2024-07-28 ENCOUNTER — Ambulatory Visit: Payer: Self-pay | Admitting: Family

## 2024-07-29 LAB — FUNGUS CULTURE WITH STAIN

## 2024-07-29 LAB — FUNGAL ORGANISM REFLEX

## 2024-07-29 LAB — FUNGUS CULTURE RESULT

## 2024-07-30 NOTE — Progress Notes (Unsigned)
 Complex Care Management Note Care Guide Note  07/30/2024 Name: [name] Phillips MRN: [id number] DOB: 09-06-1980   Complex Care Management Outreach Attempts: A second unsuccessful outreach was attempted today to offer the patient with information about available complex care management services.  Follow Up Plan:  Additional outreach attempts will be made to offer the patient complex care management information and services.   Encounter Outcome:  No Answer  Dreama [facility]  [facility], [facility] VBCI Assistant Direct Dial: [phone]  Fax: [phone]

## 2024-08-02 NOTE — Progress Notes (Signed)
 Complex Care Management Note Care Guide Note  08/02/2024 Name: [name] Phillips MRN: [id number] DOB: [birth year]   Complex Care Management Outreach Attempts: A third unsuccessful outreach was attempted today to offer the patient with information about available complex care management services.  Follow Up Plan:  No further outreach attempts will be made at this time. We have been unable to contact the patient to offer or enroll patient in complex care management services.  Encounter Outcome:  No Answer  Dreama [facility]  [facility], [facility] VBCI Assistant Direct Dial: [phone]  Fax: [phone]

## 2024-08-12 ENCOUNTER — Ambulatory Visit: Payer: Self-pay | Admitting: Family

## 2024-08-12 LAB — ACID FAST CULTURE WITH REFLEXED SENSITIVITIES (MYCOBACTERIA): Acid Fast Culture: NEGATIVE
